# Patient Record
Sex: Male | Born: 1945 | Race: White | Hispanic: No | Marital: Single | Smoking: Former smoker
Health system: Southern US, Community
[De-identification: ages and names within clinical notes are randomized; demographics above are authoritative.]

## PROBLEM LIST (undated history)

## (undated) DIAGNOSIS — E669 Obesity, unspecified: Secondary | ICD-10-CM

## (undated) DIAGNOSIS — I5020 Unspecified systolic (congestive) heart failure: Secondary | ICD-10-CM

## (undated) DIAGNOSIS — I2510 Atherosclerotic heart disease of native coronary artery without angina pectoris: Secondary | ICD-10-CM

## (undated) DIAGNOSIS — I509 Heart failure, unspecified: Secondary | ICD-10-CM

## (undated) DIAGNOSIS — I499 Cardiac arrhythmia, unspecified: Secondary | ICD-10-CM

## (undated) DIAGNOSIS — G250 Essential tremor: Secondary | ICD-10-CM

## (undated) DIAGNOSIS — D759 Disease of blood and blood-forming organs, unspecified: Secondary | ICD-10-CM

## (undated) DIAGNOSIS — E785 Hyperlipidemia, unspecified: Secondary | ICD-10-CM

## (undated) DIAGNOSIS — I219 Acute myocardial infarction, unspecified: Secondary | ICD-10-CM

## (undated) DIAGNOSIS — R251 Tremor, unspecified: Secondary | ICD-10-CM

## (undated) DIAGNOSIS — M25569 Pain in unspecified knee: Secondary | ICD-10-CM

## (undated) DIAGNOSIS — M1990 Unspecified osteoarthritis, unspecified site: Secondary | ICD-10-CM

## (undated) DIAGNOSIS — I10 Essential (primary) hypertension: Secondary | ICD-10-CM

## (undated) HISTORY — DX: Pain in unspecified knee: M25.569

## (undated) HISTORY — PX: EYE SURGERY: SHX253

## (undated) HISTORY — DX: Hyperlipidemia, unspecified: E78.5

## (undated) HISTORY — PX: CARDIAC CATHETERIZATION: SHX172

## (undated) HISTORY — DX: Essential (primary) hypertension: I10

## (undated) HISTORY — PX: HEMORRHOID SURGERY: SHX153

## (undated) HISTORY — PX: BACK SURGERY: SHX140

## (undated) HISTORY — DX: Tremor, unspecified: R25.1

## (undated) HISTORY — DX: Obesity, unspecified: E66.9

## (undated) HISTORY — PX: APPENDECTOMY: SHX54

---
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
## 1998-07-23 ENCOUNTER — Emergency Department (HOSPITAL_COMMUNITY): Admission: EM | Admit: 1998-07-23 | Discharge: 1998-07-23 | Payer: Self-pay | Admitting: *Deleted

## 2003-03-29 ENCOUNTER — Ambulatory Visit (HOSPITAL_COMMUNITY): Admission: RE | Admit: 2003-03-29 | Discharge: 2003-03-29 | Payer: Self-pay | Admitting: Pain Medicine

## 2003-05-18 ENCOUNTER — Encounter: Admission: RE | Admit: 2003-05-18 | Discharge: 2003-06-07 | Payer: Self-pay | Admitting: Neurology

## 2014-12-01 DIAGNOSIS — Z1389 Encounter for screening for other disorder: Secondary | ICD-10-CM | POA: Diagnosis not present

## 2014-12-01 DIAGNOSIS — R21 Rash and other nonspecific skin eruption: Secondary | ICD-10-CM | POA: Diagnosis not present

## 2014-12-01 DIAGNOSIS — I10 Essential (primary) hypertension: Secondary | ICD-10-CM | POA: Diagnosis not present

## 2014-12-01 DIAGNOSIS — Z1211 Encounter for screening for malignant neoplasm of colon: Secondary | ICD-10-CM | POA: Diagnosis not present

## 2014-12-01 DIAGNOSIS — Z0000 Encounter for general adult medical examination without abnormal findings: Secondary | ICD-10-CM | POA: Diagnosis not present

## 2015-01-14 DIAGNOSIS — I10 Essential (primary) hypertension: Secondary | ICD-10-CM | POA: Diagnosis not present

## 2015-03-04 DIAGNOSIS — H579 Unspecified disorder of eye and adnexa: Secondary | ICD-10-CM | POA: Diagnosis not present

## 2015-08-19 DIAGNOSIS — I10 Essential (primary) hypertension: Secondary | ICD-10-CM | POA: Diagnosis not present

## 2015-10-24 ENCOUNTER — Telehealth: Payer: Self-pay | Admitting: Physician Assistant

## 2015-10-24 NOTE — Telephone Encounter (Signed)
Mr. Timothy Munoz called the cardiology after-hour answering service for advice on his current symptom. I am not sure how he got this number. He does not be appears to be an established patient of our clinic. Nor does he have any documented history of cardiac issues. He says he has been having some dizziness when he bends over and then raise his head up. He also has been having some shortness of breath especially noticeable with walking. He denies any chest discomfort.  Given his symptom, I have advised him to keep himself hydrated. His dizziness with change in body positions sounds like orthostatic in nature. As far as his shortness of breath with walking, hydration may help that as well. However if his family physician deemed his symptom as angina equivalent, his family physician will refer the patient formally to cardiology service for assessment. I have advised that the patient to seek medical attention more urgently if started having chest discomfort.  Ramond DialSigned, Miya Luviano PA Pager: [phone]

## 2015-10-25 DIAGNOSIS — R0609 Other forms of dyspnea: Secondary | ICD-10-CM | POA: Diagnosis not present

## 2015-10-31 DIAGNOSIS — E785 Hyperlipidemia, unspecified: Secondary | ICD-10-CM | POA: Insufficient documentation

## 2015-10-31 DIAGNOSIS — I10 Essential (primary) hypertension: Secondary | ICD-10-CM | POA: Insufficient documentation

## 2015-10-31 DIAGNOSIS — E669 Obesity, unspecified: Secondary | ICD-10-CM | POA: Insufficient documentation

## 2015-11-02 ENCOUNTER — Ambulatory Visit (INDEPENDENT_AMBULATORY_CARE_PROVIDER_SITE_OTHER): Payer: Self-pay | Admitting: Cardiology

## 2015-11-02 ENCOUNTER — Encounter: Payer: Self-pay | Admitting: Cardiology

## 2015-11-02 VITALS — BP 156/90 | HR 65 | Ht 74.0 in | Wt 249.0 lb

## 2015-11-02 DIAGNOSIS — I10 Essential (primary) hypertension: Secondary | ICD-10-CM | POA: Diagnosis not present

## 2015-11-02 DIAGNOSIS — R0609 Other forms of dyspnea: Secondary | ICD-10-CM

## 2015-11-02 NOTE — Patient Instructions (Signed)
Your physician recommends that you schedule a follow-up appointment in: to be determined .   Your physician recommends that you continue on your current medications as directed. Please refer to the Current Medication list given to you today.    Your physician has requested that you have an exercise tolerance test. For further information please visit [email or web address]. Please also follow instruction sheet, as given.      Thank you for choosing [facility] !

## 2015-11-02 NOTE — Progress Notes (Signed)
     Clinical Summary Timothy Munoz is a 70 y.o.male seen today as a new patient, he is referred by Dr Lenise ArenaMeyers for the following medical problems.   1. DOE - started about 3 weeks ago. 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 Tigthness in neck. Resolved with rest, but reoccurred with again effort. Since that time has felt ok. Has started walking a few miles daily without symptoms. - had previous mild episodes in the past.  - denies any chest pain. No LE edema. - EKG from pcp NSR, no ischemic changes  CAD risk factors: HTN, mild hyperlipidemia, remote tobacco use    Past Medical History  Diagnosis Date  . Hypertension   . Hyperlipidemia     mild   . Obesity      No Known Allergies   Current Outpatient Prescriptions  Medication Sig Dispense Refill  . clotrimazole (LOTRIMIN) 1 % cream Apply 1 application topically 2 (two) times daily.    Marland Kitchen. losartan (COZAAR) 50 MG tablet Take 50 mg by mouth daily.     No current facility-administered medications for this visit.     No past surgical history on file.   No Known Allergies    No family history on file.   Social History Timothy Munoz reports that he quit smoking about 35 years ago. He does not have any smokeless tobacco history on file. Timothy Munoz reports that he drinks alcohol.   Review of Systems CONSTITUTIONAL: No weight loss, fever, chills, weakness or fatigue.  HEENT: Eyes: No visual loss, blurred vision, double vision or yellow sclerae.No hearing loss, sneezing, congestion, runny nose or sore throat.  SKIN: No rash or itching.  CARDIOVASCULAR: per HPI RESPIRATORY: No shortness of breath, cough or sputum.  GASTROINTESTINAL: No anorexia, nausea, vomiting or diarrhea. No abdominal pain or blood.  GENITOURINARY: No burning on urination, no polyuria NEUROLOGICAL: No headache, dizziness, syncope, paralysis, ataxia, numbness or tingling in the extremities. No change in bowel or bladder control.    MUSCULOSKELETAL: No muscle, back pain, joint pain or stiffness.  LYMPHATICS: No enlarged nodes. No history of splenectomy.  PSYCHIATRIC: No history of depression or anxiety.  ENDOCRINOLOGIC: No reports of sweating, cold or heat intolerance. No polyuria or polydipsia.  Marland Kitchen.   Physical Examination Filed Vitals:   11/02/15 0806  BP: 156/90  Pulse: 65   Filed Vitals:   11/02/15 0806  Height: 6\' 2"  (1.88 m)  Weight: 249 lb (112.946 kg)    Gen: resting comfortably, no acute distress HEENT: no scleral icterus, pupils equal round and reactive, no palptable cervical adenopathy,  CV: RRR, no m/r/g, no jvd Resp: Clear to auscultation bilaterally GI: abdomen is soft, non-tender, non-distended, normal bowel sounds, no hepatosplenomegaly MSK: extremities are warm, no edema.  Skin: warm, no rash Neuro:  no focal deficits Psych: appropriate affect   Assessment and Plan  1. DOE - episode of SOB and neck pain, concern for possible anginal equivalent though symptoms are atypical. He does have CAD risk factors - plan for GXT to further evaluate  2. HTN - elevated in clinic, he reports good control at previous MD visits. Continue to monitor at this time    F/u pending stress results.       Antoine PocheJonathan F. Branch, M.D.

## 2015-11-08 ENCOUNTER — Encounter (HOSPITAL_COMMUNITY): Payer: Commercial Managed Care - HMO

## 2015-12-05 DIAGNOSIS — B351 Tinea unguium: Secondary | ICD-10-CM | POA: Diagnosis not present

## 2016-03-01 DIAGNOSIS — Z1211 Encounter for screening for malignant neoplasm of colon: Secondary | ICD-10-CM | POA: Diagnosis not present

## 2016-03-01 DIAGNOSIS — E669 Obesity, unspecified: Secondary | ICD-10-CM | POA: Diagnosis not present

## 2016-03-01 DIAGNOSIS — I10 Essential (primary) hypertension: Secondary | ICD-10-CM | POA: Diagnosis not present

## 2016-05-21 DIAGNOSIS — H5200 Hypermetropia, unspecified eye: Secondary | ICD-10-CM | POA: Diagnosis not present

## 2016-05-21 DIAGNOSIS — H2513 Age-related nuclear cataract, bilateral: Secondary | ICD-10-CM | POA: Diagnosis not present

## 2016-05-21 DIAGNOSIS — Z0100 Encounter for examination of eyes and vision without abnormal findings: Secondary | ICD-10-CM | POA: Diagnosis not present

## 2016-05-21 DIAGNOSIS — I10 Essential (primary) hypertension: Secondary | ICD-10-CM | POA: Diagnosis not present

## 2016-09-10 DIAGNOSIS — Z0000 Encounter for general adult medical examination without abnormal findings: Secondary | ICD-10-CM | POA: Diagnosis not present

## 2016-09-10 DIAGNOSIS — I10 Essential (primary) hypertension: Secondary | ICD-10-CM | POA: Diagnosis not present

## 2016-10-16 DIAGNOSIS — G8929 Other chronic pain: Secondary | ICD-10-CM | POA: Diagnosis not present

## 2016-10-16 DIAGNOSIS — M25561 Pain in right knee: Secondary | ICD-10-CM | POA: Diagnosis not present

## 2016-11-19 DIAGNOSIS — R251 Tremor, unspecified: Secondary | ICD-10-CM | POA: Diagnosis not present

## 2016-11-19 DIAGNOSIS — M25561 Pain in right knee: Secondary | ICD-10-CM | POA: Diagnosis not present

## 2016-11-19 DIAGNOSIS — G8929 Other chronic pain: Secondary | ICD-10-CM | POA: Diagnosis not present

## 2016-11-22 ENCOUNTER — Ambulatory Visit: Payer: Commercial Managed Care - HMO | Admitting: Neurology

## 2016-12-24 ENCOUNTER — Encounter: Payer: Self-pay | Admitting: Neurology

## 2016-12-24 ENCOUNTER — Ambulatory Visit (INDEPENDENT_AMBULATORY_CARE_PROVIDER_SITE_OTHER): Payer: Medicare HMO | Admitting: Neurology

## 2016-12-24 ENCOUNTER — Other Ambulatory Visit: Payer: Medicare HMO

## 2016-12-24 VITALS — BP 132/72 | HR 72 | Ht 75.0 in | Wt 252.0 lb

## 2016-12-24 DIAGNOSIS — R251 Tremor, unspecified: Secondary | ICD-10-CM | POA: Diagnosis not present

## 2016-12-24 DIAGNOSIS — G250 Essential tremor: Secondary | ICD-10-CM | POA: Diagnosis not present

## 2016-12-24 LAB — TSH: TSH: 2.45 mIU/L (ref 0.40–4.50)

## 2016-12-24 LAB — COMPREHENSIVE METABOLIC PANEL
ALT: 14 U/L (ref 9–46)
AST: 16 U/L (ref 10–35)
Albumin: 4.7 g/dL (ref 3.6–5.1)
Alkaline Phosphatase: 170 U/L — ABNORMAL HIGH (ref 40–115)
BUN: 19 mg/dL (ref 7–25)
CO2: 25 mmol/L (ref 20–32)
Calcium: 9.8 mg/dL (ref 8.6–10.3)
Chloride: 101 mmol/L (ref 98–110)
Creat: 1.17 mg/dL (ref 0.70–1.18)
Glucose, Bld: 103 mg/dL — ABNORMAL HIGH (ref 65–99)
Potassium: 4.7 mmol/L (ref 3.5–5.3)
Sodium: 139 mmol/L (ref 135–146)
Total Bilirubin: 0.5 mg/dL (ref 0.2–1.2)
Total Protein: 7.2 g/dL (ref 6.1–8.1)

## 2016-12-24 NOTE — Patient Instructions (Signed)
1. Your provider has requested that you have labwork completed today. Please go to [facility] ([id number]) on the second floor of this building before leaving the office today. You do not need to check in. If you are not called within 15 minutes please check with the front desk.   

## 2016-12-24 NOTE — Progress Notes (Signed)
Timothy Munoz was seen today in the movement disorders clinic for neurologic consultation at the request of Le, Thao P, DO.  The consultation is for the evaluation of tremor.  Tremor: Yes.     How long has it been going on? 15+ years  At rest or with activation?  With activation; with hand on steering wheel  Fam hx of tremor?  Yes.  , mom had PD and AD  Located where?  R hand  Affected by caffeine:  unknown (2 cups coffee, 1 diet soda/tea per day)  Affected by alcohol:  unknown (1 beer every 2 months)  Affected by stress:  Yes.    Affected by fatigue:  No.  Spills soup if on spoon:  Yes.    (he is R hand dominant)  Peas will fall off: yes  Spills glass of liquid if full:  Yes.    Affects ADL's (tying shoes, brushing teeth, etc):  No.   Specific Symptoms:  Voice: no change Sleep: sleeps well except nocturia  Vivid Dreams:  No.  Acting out dreams:  No. Wet Pillows: Yes.   Postural symptoms:  No.  Falls?  Yes.  , but last time was 4 years ago and was on top of load of hay on top of a wagon and fell and broke wrist Bradykinesia symptoms: difficulty getting out of a chair Loss of smell:  Yes.   Loss of taste:  No. Urinary Incontinence:  No. Difficulty Swallowing:  No. Handwriting, micrographia: No. Trouble with ADL's:  No.  Trouble buttoning clothing: No. Depression:  No. Memory changes:  No. Hallucinations:  No.  visual distortions: No. N/V:  No. Lightheaded:  No.  Syncope: No. Diplopia:  No. Dyskinesia:  No.  Neuroimaging of the brain has not previously been performed.   PREVIOUS MEDICATIONS: none to date  ALLERGIES:  No Known Allergies  CURRENT MEDICATIONS:  Outpatient Encounter Prescriptions as of 12/24/2016  Medication Sig  . clotrimazole (LOTRIMIN) 1 % cream Apply 1 application topically 2 (two) times daily.  Marland Kitchen losartan (COZAAR) 50 MG tablet Take 50 mg by mouth daily.  . meloxicam (MOBIC) 15 MG tablet Take 15 mg by mouth as needed for pain.  . Omega-3  Fatty Acids (FISH OIL) 1000 MG CAPS Take by mouth.   No facility-administered encounter medications on file as of 12/24/2016.     PAST MEDICAL HISTORY:   Past Medical History:  Diagnosis Date  . Hyperlipidemia    mild   . Hypertension   . Knee pain   . Obesity     PAST SURGICAL HISTORY:   Past Surgical History:  Procedure Laterality Date  . APPENDECTOMY    . BACK SURGERY    . HEMORRHOID SURGERY      SOCIAL HISTORY:   Social History   Social History  . Marital status: Single    Spouse name: N/A  . Number of children: N/A  . Years of education: N/A   Occupational History  . retired     Pharmacist, hospital   Social History Main Topics  . Smoking status: Former Smoker    Quit date: 06/01/1980  . Smokeless tobacco: Current User    Types: Chew  . Alcohol use Yes     Comment: one beer every two months  . Drug use: No  . Sexual activity: Not on file   Other Topics Concern  . Not on file   Social History Narrative  . No narrative on file  FAMILY HISTORY:   Family Status  Relation Status  . Mother Deceased  . Father Deceased  . Sister Alive  . Child Alive    ROS:  A complete 10 system review of systems was obtained and was unremarkable apart from what is mentioned above.  PHYSICAL EXAMINATION:    VITALS:   Vitals:   12/24/16 0857  BP: 132/72  Pulse: 72  SpO2: 98%  Weight: 252 lb (114.3 kg)  Height: 6\' 3"  (1.905 m)    GEN:  The patient appears stated age and is in NAD. HEENT:  Normocephalic, atraumatic.  The mucous membranes are moist. The superficial temporal arteries are without ropiness or tenderness. CV:  RRR Lungs:  CTAB Neck/HEME:  There are no carotid bruits bilaterally.  Neurological examination:  Orientation: The patient is alert and oriented x3. Fund of knowledge is appropriate.  Recent and remote memory are intact.  Attention and concentration are normal.    Able to name objects and repeat phrases. Cranial nerves: There is good facial  symmetry. Pupils are  reactive to light bilaterally.  Pupil on the right is slightly asymmetric.  Fundoscopic exam reveals clear margins bilaterally. Extraocular muscles are intact with the exception of mild upgaze paresis. The visual fields are full to confrontational testing. The speech is fluent and clear. Soft palate rises symmetrically and there is no tongue deviation. Hearing is intact to conversational tone. Sensation: Sensation is intact to light and pinprick throughout (facial, trunk, extremities). Vibration is intact at the right big toe and both ankles. There is no extinction with double simultaneous stimulation. There is no sensory dermatomal level identified. Motor: Strength is 5/5 in the bilateral upper and lower extremities.   Shoulder shrug is equal and symmetric.  There is no pronator drift. Deep tendon reflexes: Deep tendon reflexes are 2/4 at the bilateral biceps, triceps, brachioradialis, patella and achilles. Plantar responses are downgoing bilaterally.  Movement examination: Tone: There is normal tone in the bilateral upper extremities.  The tone in the lower extremities is normal. (he does have some trouble relaxing) Abnormal movements: There is no resting tremor.  he has some difficulty with archimedes spirals on the right but does fairly well on the L.  he has mild difficulty when asked to pour a full glass of water from one glass to another but spills it minimally. Coordination:  There is no decremation with RAM's, with alternating supination and pronation of the forearm, hand opening and closing, heel taps and toe taps.  Minimal trouble with finger taps on the right. Gait and Station: The patient has no difficulty arising out of a deep-seated chair without the use of the hands. The patient's stride length is normal.  Walks well tandem and with heel/toe.  Stands in [place] position.    ASSESSMENT/PLAN:  1.   Essential Tremor.  -His is asymmetric but that can occur in about 30%  of cases.  He does have a longstanding hx of tremor.  We discussed nature and pathophysiology.  We discussed that this can continue to gradually get worse with time.  We discussed that some medications can worsen this, as can caffeine use.  We discussed medication therapy as well as surgical therapy.  Ultimately, the patient decided to hold on medication and take a wait and see approach.  I would like to check his TSH and chem since he states that it has been greater than 5 years since he has had labs.  We will call him with results.  I  would also like to see him in a year.  This is because of inability to relax for testing and just slight difficulty in doing finger taps on the right.  At this point in time, he does not meet criteria for PD and I stressed that to him.  If he has new sx's develop before a year, he will call me.  Much greater than 50% of this visit was spent in counseling and coordinating care.  Total face to face time:  45 min      Cc:  [name], MD

## 2016-12-25 ENCOUNTER — Telehealth: Payer: Self-pay | Admitting: Neurology

## 2016-12-25 NOTE — Telephone Encounter (Signed)
Patient made aware of results and instructions. Labs sent to PCP.

## 2016-12-25 NOTE — Telephone Encounter (Signed)
-----   Message from Octaviano Battyebecca S Tat, DO sent at 12/25/2016  7:32 AM EDT ----- Lesly RubensteinJade, Let pt know that labs okay except ALP and that was up a little.  That is nonspecific and can even relate to bone health.  Have him f/u with PCP about that and make sure that PCP has copy of labs.

## 2016-12-31 ENCOUNTER — Ambulatory Visit: Payer: Commercial Managed Care - HMO | Admitting: Neurology

## 2017-03-25 DIAGNOSIS — I10 Essential (primary) hypertension: Secondary | ICD-10-CM | POA: Diagnosis not present

## 2017-03-25 DIAGNOSIS — M546 Pain in thoracic spine: Secondary | ICD-10-CM | POA: Diagnosis not present

## 2017-09-23 DIAGNOSIS — R69 Illness, unspecified: Secondary | ICD-10-CM | POA: Diagnosis not present

## 2017-12-12 DIAGNOSIS — Z6833 Body mass index (BMI) 33.0-33.9, adult: Secondary | ICD-10-CM | POA: Diagnosis not present

## 2017-12-12 DIAGNOSIS — Z1211 Encounter for screening for malignant neoplasm of colon: Secondary | ICD-10-CM | POA: Diagnosis not present

## 2017-12-12 DIAGNOSIS — E669 Obesity, unspecified: Secondary | ICD-10-CM | POA: Diagnosis not present

## 2017-12-12 DIAGNOSIS — I10 Essential (primary) hypertension: Secondary | ICD-10-CM | POA: Diagnosis not present

## 2017-12-23 NOTE — Progress Notes (Deleted)
Timothy Munoz was seen today in the movement disorders clinic for follow up for asymmetric essential tremor.  It has been a year since he has been seen.    PREVIOUS MEDICATIONS: none to date  ALLERGIES:  No Known Allergies  CURRENT MEDICATIONS:  Outpatient Encounter Medications as of 12/24/2017  Medication Sig  . clotrimazole (LOTRIMIN) 1 % cream Apply 1 application topically 2 (two) times daily.  Marland Kitchen. losartan (COZAAR) 50 MG tablet Take 50 mg by mouth daily.  . meloxicam (MOBIC) 15 MG tablet Take 15 mg by mouth as needed for pain.  . Omega-3 Fatty Acids (FISH OIL) 1000 MG CAPS Take by mouth.   No facility-administered encounter medications on file as of 12/24/2017.     PAST MEDICAL HISTORY:   Past Medical History:  Diagnosis Date  . Hyperlipidemia    mild   . Hypertension   . Knee pain   . Obesity     PAST SURGICAL HISTORY:   Past Surgical History:  Procedure Laterality Date  . APPENDECTOMY    . BACK SURGERY    . HEMORRHOID SURGERY      SOCIAL HISTORY:   Social History   Socioeconomic History  . Marital status: Single    Spouse name: Not on file  . Number of children: Not on file  . Years of education: Not on file  . Highest education level: Not on file  Occupational History  . Occupation: retired    Comment: Pharmacist, [employer]  Social Needs  . Financial resource strain: Not on file  . Food insecurity:    Worry: Not on file    Inability: Not on file  . Transportation needs:    Medical: Not on file    Non-medical: Not on file  Tobacco Use  . Smoking status: Former Smoker    Last attempt to quit: 06/01/1980    Years since quitting: 37.5  . Smokeless tobacco: Current User    Types: Chew  Substance and Sexual Activity  . Alcohol use: Yes    Comment: one beer every two months  . Drug use: No  . Sexual activity: Not on file  Lifestyle  . Physical activity:    Days per week: Not on file    Minutes per session: Not on file  . Stress: Not on file    Relationships  . Social connections:    Talks on phone: Not on file    Gets together: Not on file    Attends religious service: Not on file    Active member of club or organization: Not on file    Attends meetings of clubs or organizations: Not on file    Relationship status: Not on file  . Intimate partner violence:    Fear of current or ex partner: Not on file    Emotionally abused: Not on file    Physically abused: Not on file    Forced sexual activity: Not on file  Other Topics Concern  . Not on file  Social History Narrative  . Not on file    FAMILY HISTORY:   Family Status  Relation Name Status  . Mother  Deceased  . Father  Deceased  . Sister  Alive  . Child x2 Alive    ROS:  A complete 10 system review of systems was obtained and was unremarkable apart from what is mentioned above.  PHYSICAL EXAMINATION:    VITALS:   There were no vitals filed for this visit.  GEN:  The patient appears stated age and is in NAD. HEENT:  Normocephalic, atraumatic.  The mucous membranes are moist. The superficial temporal arteries are without ropiness or tenderness. CV:  RRR Lungs:  CTAB Neck/HEME:  There are no carotid bruits bilaterally.  Neurological examination:  Orientation: The patient is alert and oriented x3. Fund of knowledge is appropriate.  Recent and remote memory are intact.  Attention and concentration are normal.    Able to name objects and repeat phrases. Cranial nerves: There is good facial symmetry. Pupils are  reactive to light bilaterally.  Pupil on the right is slightly asymmetric.  Fundoscopic exam reveals clear margins bilaterally. Extraocular muscles are intact with the exception of mild upgaze paresis. The visual fields are full to confrontational testing. The speech is fluent and clear. Soft palate rises symmetrically and there is no tongue deviation. Hearing is intact to conversational tone. Sensation: Sensation is intact to light and pinprick throughout  (facial, trunk, extremities). Vibration is intact at the right big toe and both ankles. There is no extinction with double simultaneous stimulation. There is no sensory dermatomal level identified. Motor: Strength is 5/5 in the bilateral upper and lower extremities.   Shoulder shrug is equal and symmetric.  There is no pronator drift. Deep tendon reflexes: Deep tendon reflexes are 2/4 at the bilateral biceps, triceps, brachioradialis, patella and achilles. Plantar responses are downgoing bilaterally.  Movement examination: Tone: There is normal tone in the bilateral upper extremities.  The tone in the lower extremities is normal. (he does have some trouble relaxing) Abnormal movements: There is no resting tremor.  he has some difficulty with archimedes spirals on the right but does fairly well on the L.  he has mild difficulty when asked to pour a full glass of water from one glass to another but spills it minimally. Coordination:  There is no decremation with RAM's, with alternating supination and pronation of the forearm, hand opening and closing, heel taps and toe taps.  Minimal trouble with finger taps on the right. Gait and Station: The patient has no difficulty arising out of a deep-seated chair without the use of the hands. The patient's stride length is normal.  Walks well tandem and with heel/toe.  Stands in [place] position.    Labs: Lab Results  Component Value Date   TSH 2.45 12/24/2016     Chemistry      Component Value Date/Time   NA 139 12/24/2016 1015   K 4.7 12/24/2016 1015   CL 101 12/24/2016 1015   CO2 25 12/24/2016 1015   BUN 19 12/24/2016 1015   CREATININE 1.17 12/24/2016 1015      Component Value Date/Time   CALCIUM 9.8 12/24/2016 1015   ALKPHOS 170 (H) 12/24/2016 1015   AST 16 12/24/2016 1015   ALT 14 12/24/2016 1015   BILITOT 0.5 12/24/2016 1015       ASSESSMENT/PLAN:  1.   Essential Tremor.  -His is asymmetric but that can occur in about 30% of cases.   He does have a longstanding hx of tremor.  We discussed nature and pathophysiology.  We discussed that this can continue to gradually get worse with time.  We discussed that some medications can worsen this, as can caffeine use.  We discussed medication therapy as well as surgical therapy.  Ultimately, the patient decided to hold on medication and take a wait and see approach.  I would like to check his TSH and chem since he states that it  has been greater than 5 years since he has had labs.  We will call him with results.  I would also like to see him in a year.  This is because of inability to relax for testing and just slight difficulty in doing finger taps on the right.  At this point in time, he does not meet criteria for PD and I stressed that to him.  If he has new sx's develop before a year, he will call me.  Much greater than 50% of this visit was spent in counseling and coordinating care.  Total face to face time:  45 min      Cc:  Joycelyn RuaMeyers, Stephen, MD

## 2017-12-24 ENCOUNTER — Ambulatory Visit: Payer: Medicare HMO | Admitting: Neurology

## 2019-01-22 DIAGNOSIS — B356 Tinea cruris: Secondary | ICD-10-CM | POA: Diagnosis not present

## 2019-01-22 DIAGNOSIS — I10 Essential (primary) hypertension: Secondary | ICD-10-CM | POA: Diagnosis not present

## 2019-01-22 DIAGNOSIS — G250 Essential tremor: Secondary | ICD-10-CM | POA: Diagnosis not present

## 2019-03-04 ENCOUNTER — Ambulatory Visit (INDEPENDENT_AMBULATORY_CARE_PROVIDER_SITE_OTHER): Payer: Medicare HMO | Admitting: Neurology

## 2019-03-04 ENCOUNTER — Other Ambulatory Visit: Payer: Self-pay

## 2019-03-04 ENCOUNTER — Encounter: Payer: Self-pay | Admitting: Neurology

## 2019-03-04 DIAGNOSIS — G250 Essential tremor: Secondary | ICD-10-CM

## 2019-03-04 HISTORY — DX: Essential tremor: G25.0

## 2019-03-04 MED ORDER — ALPRAZOLAM 0.5 MG PO TABS: 0.5000 mg | ORAL_TABLET | Freq: Three times a day (TID) | ORAL | 2 refills | Status: DC | PRN

## 2019-03-04 NOTE — Progress Notes (Signed)
Reason for visit: Tremor  Referring physician: Dr. Para March Timothy Munoz is a 73 y.o. male  History of present illness:  Timothy Munoz is a 73 year old right-handed white male with a history of a tremor affecting both upper extremities for greater than 17 years.  The patient claims that his mother had Alzheimer's disease but she also had tremor but was told she had Parkinson's disease.  He knows of no other family members who have had tremor, he has 1 sister.  The patient indicates that over time, the tremor has gradually worsened.  He is having increasing problems with handwriting and with feeding himself, he likes to do a lot of fishing but has trouble tying the knots on the fishing line.  The patient does not have any tremor when resting the arms.  He reports no tremors associated with the head or neck or with the voice.  He reports some mild gait instability problems but no problems with falling.  He does have urinary frequency but no incontinence.  He reports no significant numbness or weakness of the extremities.  He comes to this office for an evaluation.  Past Medical History:  Diagnosis Date  . Hyperlipidemia    mild   . Hypertension   . Knee pain   . Obesity   . Tremor     Past Surgical History:  Procedure Laterality Date  . APPENDECTOMY    . BACK SURGERY    . HEMORRHOID SURGERY      Family History  Problem Relation Age of Onset  . Hypertension Mother   . Parkinson's disease Mother   . Alzheimer's disease Mother   . Hypertension Father   . Suicidality Father     Social history:  reports that he quit smoking about 38 years ago. His smokeless tobacco use includes chew. He reports current alcohol use. He reports that he does not use drugs.  Medications:  Prior to Admission medications   Medication Sig Start Date End Date Taking? Authorizing Provider  losartan (COZAAR) 50 MG tablet Take 50 mg by mouth daily.   Yes [provider]  Omega-3 Fatty Acids  (FISH OIL) 1000 MG CAPS Take by mouth.   Yes [provider]     No Known Allergies  ROS:  Out of a complete 14 system review of symptoms, the patient complains only of the following symptoms, and all other reviewed systems are negative.  Tremor  Blood pressure (!) 143/88, pulse 76, temperature 99.2 F (37.3 C), temperature source Temporal, height 6\' 3"  (1.905 m), weight 257 lb (116.6 kg).  Physical Exam  General: The patient is alert and cooperative at the time of the examination.  Eyes: Pupils are equal, round, and reactive to light. Discs are flat bilaterally.  Neck: The neck is supple, no carotid bruits are noted.  Respiratory: The respiratory examination is clear.  Cardiovascular: The cardiovascular examination reveals a regular rate and rhythm, no obvious murmurs or rubs are noted.  Skin: Extremities are without significant edema.  Neurologic Exam  Mental status: The patient is alert and oriented x 3 at the time of the examination. The patient has apparent normal recent and remote memory, with an apparently normal attention span and concentration ability.  Cranial nerves: Facial symmetry is present. There is good sensation of the face to pinprick and soft touch bilaterally. The strength of the facial muscles and the muscles to head turning and shoulder shrug are normal bilaterally. Speech is well enunciated, no  aphasia or dysarthria is noted. Extraocular movements are full. Visual fields are full. The tongue is midline, and the patient has symmetric elevation of the soft palate. No obvious hearing deficits are noted.  Motor: The motor testing reveals 5 over 5 strength of all 4 extremities. Good symmetric motor tone is noted throughout.  Sensory: Sensory testing is intact to pinprick, soft touch, vibration sensation, and position sense on the upper extremities.  With the lower extremities there is a stocking pattern pinprick sensory deficit across the ankles  bilaterally with decreased vibration sensation in both feet, position sensation is normal in both feet.  No evidence of extinction is noted.  Coordination: Cerebellar testing reveals good finger-nose-finger and heel-to-shin bilaterally.  The patient does have an intention tremor with finger-nose-finger bilaterally, when trying to draw a spiral there is tremor translated into the handwriting.  Gait and station: Gait is normal. Tandem gait is normal. Romberg is negative. No drift is seen.  Reflexes: Deep tendon reflexes are symmetric, but are depressed in the lower extremities bilaterally. Toes are downgoing bilaterally.   Assessment/Plan:  1.  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  The patient does not wish to go on a daily medication, but he may want to use something intermittently.  I will send in a prescription for alprazolam taking 0.5 mg up to 3 times a day if needed.  On the days he does not need medication at all, he will take nothing.  The patient will follow-up here in 6 months, he will call for any dose adjustments.  Marlan Palau MD 03/04/2019 11:23 AM  [facility] [address] [place], [place] [address]  Phone [phone] Fax [phone]

## 2019-03-04 NOTE — Patient Instructions (Signed)
May take alprazolam 0.5 mg if needed for the tremor.

## 2019-03-05 DIAGNOSIS — H5203 Hypermetropia, bilateral: Secondary | ICD-10-CM | POA: Diagnosis not present

## 2019-03-05 DIAGNOSIS — H524 Presbyopia: Secondary | ICD-10-CM | POA: Diagnosis not present

## 2019-03-05 DIAGNOSIS — H52223 Regular astigmatism, bilateral: Secondary | ICD-10-CM | POA: Diagnosis not present

## 2019-06-22 ENCOUNTER — Other Ambulatory Visit: Payer: Self-pay | Admitting: Neurology

## 2019-09-02 ENCOUNTER — Ambulatory Visit: Payer: Medicare HMO | Admitting: Neurology

## 2019-12-15 DIAGNOSIS — M25551 Pain in right hip: Secondary | ICD-10-CM | POA: Diagnosis not present

## 2019-12-15 DIAGNOSIS — Z1211 Encounter for screening for malignant neoplasm of colon: Secondary | ICD-10-CM | POA: Diagnosis not present

## 2019-12-21 DIAGNOSIS — M533 Sacrococcygeal disorders, not elsewhere classified: Secondary | ICD-10-CM | POA: Diagnosis not present

## 2020-01-20 ENCOUNTER — Telehealth: Payer: Self-pay | Admitting: Neurology

## 2020-01-20 MED ORDER — ALPRAZOLAM 0.5 MG PO TABS: 0.5000 mg | ORAL_TABLET | Freq: Three times a day (TID) | ORAL | 0 refills | Status: DC | PRN

## 2020-01-20 NOTE — Telephone Encounter (Signed)
Called patient and advised he needs FU; he canceled FU in April. We scheduled for soonest with NP, and I advised we will refill Xanax until he comes in for FU. Patient verbalized understanding, appreciation. unableto complete drug check on [place] [facility] registry.  [employer], spoke with Vanice Sarah who checked [place] drug registry. He got Xanax Rx on Oct 2020, Dec 2020, Feb 2021 for #30 tabs each time. Refill request sent to work in [place].

## 2020-01-20 NOTE — Telephone Encounter (Signed)
Meds ordered this encounter  Medications  . ALPRAZolam (XANAX) 0.5 MG tablet    Sig: Take 1 tablet (0.5 mg total) by mouth 3 (three) times daily as needed (tremor).    Dispense:  30 tablet    Refill:  0   Suanne Marker, MD 01/20/2020, 9:51 AM Certified in Neurology, Neurophysiology and Neuroimaging  [facility] [address], [address] [place], [place] [address] [phone]

## 2020-01-20 NOTE — Telephone Encounter (Signed)
Pt request refill ALPRAZolam (XANAX) 0.5 MG tablet at [facility]

## 2020-01-25 DIAGNOSIS — H52223 Regular astigmatism, bilateral: Secondary | ICD-10-CM | POA: Diagnosis not present

## 2020-01-25 DIAGNOSIS — H5203 Hypermetropia, bilateral: Secondary | ICD-10-CM | POA: Diagnosis not present

## 2020-01-25 DIAGNOSIS — H524 Presbyopia: Secondary | ICD-10-CM | POA: Diagnosis not present

## 2020-02-25 ENCOUNTER — Telehealth: Payer: Self-pay

## 2020-02-25 NOTE — Telephone Encounter (Signed)
Attempted to call pt, LVM  

## 2020-02-29 ENCOUNTER — Ambulatory Visit: Payer: Medicare HMO | Admitting: Neurology

## 2020-02-29 ENCOUNTER — Encounter: Payer: Self-pay | Admitting: Neurology

## 2020-02-29 VITALS — BP 179/100 | HR 68 | Ht 75.0 in | Wt 265.6 lb

## 2020-02-29 DIAGNOSIS — G250 Essential tremor: Secondary | ICD-10-CM

## 2020-02-29 MED ORDER — ALPRAZOLAM 0.5 MG PO TABS: 0.5000 mg | ORAL_TABLET | Freq: Three times a day (TID) | ORAL | 0 refills | Status: DC | PRN

## 2020-02-29 NOTE — Progress Notes (Signed)
I have read the note, and I agree with the clinical assessment and plan.  Timothy Munoz   

## 2020-02-29 NOTE — Progress Notes (Signed)
PATIENT: Timothy Munoz DOB: 05-05-1946  REASON FOR VISIT: follow up HISTORY FROM: patient  HISTORY OF PRESENT ILLNESS: Today 02/29/20 Timothy Munoz is a 74 year old male with history of tremor affecting both upper extremities.  Reportedly his mother had Alzheimer's disease, and Parkinson's disease with tremor.  He takes Xanax as needed for tremor, never more than once a day, but he thinks he helps when he takes it.  The tremor is gradually progressing over time.  There are times he has significant difficulty with eating, stopped for spaghetti recently, had terrible time.  Sometimes holding the steering well with his right hand, will notice tremoring, or holding something with a tight grip, affects handwriting.  Tremor more in the right hand than left.  No falls, balance is okay.  He is retired, but remains active.  His overall health is good.  Presents today for evaluation unaccompanied.  HISTORY 03/04/2019 Dr. Anne Hahn: Timothy Munoz is a 74 year old right-handed white male with a history of a tremor affecting both upper extremities for greater than 17 years.  The patient claims that his mother had Alzheimer's disease but she also had tremor but was told she had Parkinson's disease.  He knows of no other family members who have had tremor, he has 1 sister.  The patient indicates that over time, the tremor has gradually worsened.  He is having increasing problems with handwriting and with feeding himself, he likes to do a lot of fishing but has trouble tying the knots on the fishing line.  The patient does not have any tremor when resting the arms.  He reports no tremors associated with the head or neck or with the voice.  He reports some mild gait instability problems but no problems with falling.  He does have urinary frequency but no incontinence.  He reports no significant numbness or weakness of the extremities.  He comes to this office for an evaluation.   REVIEW OF SYSTEMS: Out of a complete 14  system review of symptoms, the patient complains only of the following symptoms, and all other reviewed systems are negative.  Tremor  ALLERGIES: No Known Allergies  HOME MEDICATIONS: Outpatient Medications Prior to Visit  Medication Sig Dispense Refill  . losartan (COZAAR) 50 MG tablet Take 50 mg by mouth daily.    . Omega-3 Fatty Acids (FISH OIL) 1000 MG CAPS Take by mouth.    . ALPRAZolam (XANAX) 0.5 MG tablet Take 1 tablet (0.5 mg total) by mouth 3 (three) times daily as needed (tremor). 30 tablet 0   No facility-administered medications prior to visit.    PAST MEDICAL HISTORY: Past Medical History:  Diagnosis Date  . Hyperlipidemia    mild   . Hypertension   . Knee pain   . Obesity   . Tremor   . Tremor, essential 03/04/2019    PAST SURGICAL HISTORY: Past Surgical History:  Procedure Laterality Date  . APPENDECTOMY    . BACK SURGERY    . HEMORRHOID SURGERY      FAMILY HISTORY: Family History  Problem Relation Age of Onset  . Hypertension Mother   . Parkinson's disease Mother   . Alzheimer's disease Mother   . Hypertension Father   . Suicidality Father     SOCIAL HISTORY: Social History   Socioeconomic History  . Marital status: Single    Spouse name: Not on file  . Number of children: Not on file  . Years of education: Not on file  . Highest education  level: Not on file  Occupational History  . Occupation: retired    Comment: Pharmacist, hospital  Tobacco Use  . Smoking status: Former Smoker    Quit date: 06/01/1980    Years since quitting: 39.7  . Smokeless tobacco: Current User    Types: Chew  Substance and Sexual Activity  . Alcohol use: Yes    Comment: one beer every two months  . Drug use: No  . Sexual activity: Not on file  Other Topics Concern  . Not on file  Social History Narrative   Right handed    Social Determinants of Health   Financial Resource Strain:   . Difficulty of Paying Living Expenses: Not on file  Food Insecurity:    . Worried About Programme researcher, broadcasting/film/video in the Last Year: Not on file  . Ran Out of Food in the Last Year: Not on file  Transportation Needs:   . Lack of Transportation (Medical): Not on file  . Lack of Transportation (Non-Medical): Not on file  Physical Activity:   . Days of Exercise per Week: Not on file  . Minutes of Exercise per Session: Not on file  Stress:   . Feeling of Stress : Not on file  Social Connections:   . Frequency of Communication with Friends and Family: Not on file  . Frequency of Social Gatherings with Friends and Family: Not on file  . Attends Religious Services: Not on file  . Active Member of Clubs or Organizations: Not on file  . Attends Banker Meetings: Not on file  . Marital Status: Not on file  Intimate Partner Violence:   . Fear of Current or Ex-Partner: Not on file  . Emotionally Abused: Not on file  . Physically Abused: Not on file  . Sexually Abused: Not on file   PHYSICAL EXAM  Vitals:   02/29/20 0850  BP: (!) 179/100  Pulse: 68  Weight: 265 lb 9.6 oz (120.5 kg)  Height: 6\' 3"  (1.905 m)   Body mass index is 33.2 kg/m. Generalized: Well developed, in no acute distress  Neurological examination  Mentation: Alert oriented to time, place, history taking. Follows all commands speech and language fluent Cranial nerve II-XII: Pupils were equal round reactive to light. Extraocular movements were full, visual field were full on confrontational test. Facial sensation and strength were normal.  Head turning and shoulder shrug  were normal and symmetric. Motor: The motor testing reveals 5 over 5 strength of all 4 extremities. Good symmetric motor tone is noted throughout.  Sensory: Sensory testing is intact to soft touch on all 4 extremities. No evidence of extinction is noted.  Coordination: Cerebellar testing reveals good finger-nose-finger and heel-to-shin bilaterally.  Mild to moderate intention tremor with finger-nose-finger  bilaterally. Gait and station: Gait is normal.  DIAGNOSTIC DATA (LABS, IMAGING, TESTING) - I reviewed patient records, labs, notes, testing and imaging myself where available.  No results found for: WBC, HGB, HCT, MCV, PLT    Component Value Date/Time   NA 139 12/24/2016 1015   K 4.7 12/24/2016 1015   CL 101 12/24/2016 1015   CO2 25 12/24/2016 1015   GLUCOSE 103 (H) 12/24/2016 1015   BUN 19 12/24/2016 1015   CREATININE 1.17 12/24/2016 1015   CALCIUM 9.8 12/24/2016 1015   PROT 7.2 12/24/2016 1015   ALBUMIN 4.7 12/24/2016 1015   AST 16 12/24/2016 1015   ALT 14 12/24/2016 1015   ALKPHOS 170 (H) 12/24/2016 1015   BILITOT 0.5  12/24/2016 1015   No results found for: CHOL, HDL, LDLCALC, LDLDIRECT, TRIG, CHOLHDL No results found for: MVHQ4O No results found for: VITAMINB12 Lab Results  Component Value Date   TSH 2.45 12/24/2016   ASSESSMENT AND PLAN 74 y.o. year old male  has a past medical history of Hyperlipidemia, Hypertension, Knee pain, Obesity, Tremor, and Tremor, essential (03/04/2019). here with:  1.  Essential tremor -Bilateral upper extremities, R > L, progressing over time -Taking Xanax 0.5 mg daily, seems to help, will try taking twice daily to see if benefit (checked drug registry, no recent fills on file) -If no help, will consider propanolol starting 20 mg BID , Xanax as needed  -Repeat BP 160/80, needs to schedule annual physical with PCP -Call in 1 month with progress report on Xanax and next steps -Follow-up in 6 months or sooner if needed  I spent 20 minutes of face-to-face and non-face-to-face time with patient.  This included previsit chart review, lab review, study review, order entry, electronic health record documentation, patient education.  Margie Ege, AGNP-C, DNP 02/29/2020, 9:18 AM [facility] [address] [place], [place] [address] [phone]

## 2020-02-29 NOTE — Patient Instructions (Signed)
Try taking the Xanax twice daily for tremor  If not helpful after 1 month, let me know, we can consider other options  See you back in 6 months

## 2020-03-03 ENCOUNTER — Ambulatory Visit: Payer: Self-pay | Admitting: Neurology

## 2020-03-15 ENCOUNTER — Telehealth: Payer: Self-pay | Admitting: Neurology

## 2020-03-15 MED ORDER — PROPRANOLOL HCL 20 MG PO TABS: 20.0000 mg | ORAL_TABLET | Freq: Two times a day (BID) | ORAL | 6 refills | Status: DC

## 2020-03-15 NOTE — Telephone Encounter (Signed)
Spoke to pt  He states he is currently taking Alprazolam 0.5 MG tab twice a day and it makes him feel very drowsy throughout the day. He also states tremors have not improved. He would like to know what Karren Burly NP recommends.

## 2020-03-15 NOTE — Telephone Encounter (Signed)
If xanax is not helpful we can try propanolol, is BP medication used for tremor symptomatic management, will try low-dose 20 mg twice a day.  Can still take Xanax as needed for tremor. Watch out for fatigue, low HR. Should be seeing PCP soon, BP was elevated. If it is helpful, we can go higher on the dosing. Let me know if he wants to try it.

## 2020-03-15 NOTE — Addendum Note (Signed)
Addended by: Glean Salvo on: 03/15/2020 02:47 PM   Modules accepted: Orders

## 2020-03-15 NOTE — Telephone Encounter (Signed)
Pt called, ALPRAZolam (XANAX) 0.5 MG tablet is not working. Would like to speak with the nurse.

## 2020-03-15 NOTE — Telephone Encounter (Signed)
Pt agrees with plan, states he is scheduled with PCP And would [facility]

## 2020-07-06 DIAGNOSIS — M25775 Osteophyte, left foot: Secondary | ICD-10-CM | POA: Diagnosis not present

## 2020-07-06 DIAGNOSIS — B351 Tinea unguium: Secondary | ICD-10-CM | POA: Diagnosis not present

## 2020-07-06 DIAGNOSIS — L600 Ingrowing nail: Secondary | ICD-10-CM | POA: Diagnosis not present

## 2020-07-06 DIAGNOSIS — M25774 Osteophyte, right foot: Secondary | ICD-10-CM | POA: Diagnosis not present

## 2020-07-13 DIAGNOSIS — L600 Ingrowing nail: Secondary | ICD-10-CM | POA: Diagnosis not present

## 2020-07-21 DIAGNOSIS — L600 Ingrowing nail: Secondary | ICD-10-CM | POA: Diagnosis not present

## 2020-07-21 DIAGNOSIS — M25775 Osteophyte, left foot: Secondary | ICD-10-CM | POA: Diagnosis not present

## 2020-07-25 DIAGNOSIS — R69 Illness, unspecified: Secondary | ICD-10-CM | POA: Diagnosis not present

## 2020-07-28 DIAGNOSIS — B351 Tinea unguium: Secondary | ICD-10-CM | POA: Diagnosis not present

## 2020-08-04 DIAGNOSIS — B351 Tinea unguium: Secondary | ICD-10-CM | POA: Diagnosis not present

## 2020-08-29 ENCOUNTER — Encounter: Payer: Self-pay | Admitting: Neurology

## 2020-08-29 ENCOUNTER — Ambulatory Visit: Payer: Medicare HMO | Admitting: Neurology

## 2020-08-29 VITALS — BP 171/87 | HR 51 | Ht 75.0 in | Wt 268.0 lb

## 2020-08-29 DIAGNOSIS — G250 Essential tremor: Secondary | ICD-10-CM

## 2020-08-29 MED ORDER — PRIMIDONE 50 MG PO TABS: ORAL_TABLET | ORAL | 5 refills | Status: DC

## 2020-08-29 NOTE — Progress Notes (Signed)
I have read the note, and I agree with the clinical assessment and plan.  Delano Scardino K Deren Degrazia   

## 2020-08-29 NOTE — Progress Notes (Signed)
PATIENT: Timothy Munoz DOB: [birth year]  REASON FOR VISIT: follow up HISTORY FROM: patient  HISTORY OF PRESENT ILLNESS: Today 08/29/20 Timothy Munoz is a 75 year old male with history of tremor affecting both upper extremities.  His mother had Alzheimer's disease and Parkinson's disease with tremor.  His tremor worsens over time, has tremor with action, most problematic with handwriting, eating or drinking.  Tremor is greater in the right hand than the left.  When taking Xanax twice daily, resulted in drowsiness.  Currently on propanolol 20 mg in AM, thinks it is somewhat helpful, but heart rate is 51.  He lives alone, remains active.  Is retired.  Is due to see his primary care doctor.  Has had blood work done recently, reports was normal.  Here today for evaluation unaccompanied.  Update 02/29/2020 SS: Timothy Munoz is a 75 year old male with history of tremor affecting both upper extremities.  Reportedly his mother had Alzheimer's disease, and Parkinson's disease with tremor.  He takes Xanax as needed for tremor, never more than once a day, but he thinks he helps when he takes it.  The tremor is gradually progressing over time.  There are times he has significant difficulty with eating, stopped for spaghetti recently, had terrible time.  Sometimes holding the steering well with his right hand, will notice tremoring, or holding something with a tight grip, affects handwriting.  Tremor more in the right hand than left.  No falls, balance is okay.  He is retired, but remains active.  His overall health is good.  Presents today for evaluation unaccompanied.  HISTORY 03/04/2019 Dr. Anne Hahn: Timothy Munoz is a 75 year old right-handed white male with a history of a tremor affecting both upper extremities for greater than 17 years.  The patient claims that his mother had Alzheimer's disease but she also had tremor but was told she had Parkinson's disease.  He knows of no other family members who have had  tremor, he has 1 sister.  The patient indicates that over time, the tremor has gradually worsened.  He is having increasing problems with handwriting and with feeding himself, he likes to do a lot of fishing but has trouble tying the knots on the fishing line.  The patient does not have any tremor when resting the arms.  He reports no tremors associated with the head or neck or with the voice.  He reports some mild gait instability problems but no problems with falling.  He does have urinary frequency but no incontinence.  He reports no significant numbness or weakness of the extremities.  He comes to this office for an evaluation.   REVIEW OF SYSTEMS: Out of a complete 14 system review of symptoms, the patient complains only of the following symptoms, and all other reviewed systems are negative.  Tremor  ALLERGIES: No Known Allergies  HOME MEDICATIONS: Outpatient Medications Prior to Visit  Medication Sig Dispense Refill  . losartan (COZAAR) 50 MG tablet Take 50 mg by mouth daily.    . Omega-3 Fatty Acids (FISH OIL) 1000 MG CAPS Take by mouth.    . terbinafine (LAMISIL) 250 MG tablet     . propranolol (INDERAL) 20 MG tablet Take 1 tablet (20 mg total) by mouth 2 (two) times daily. 60 tablet 6  . ALPRAZolam (XANAX) 0.5 MG tablet Take 1 tablet (0.5 mg total) by mouth 3 (three) times daily as needed (tremor). 60 tablet 0   No facility-administered medications prior to visit.    PAST MEDICAL HISTORY:  Past Medical History:  Diagnosis Date  . Hyperlipidemia    mild   . Hypertension   . Knee pain   . Obesity   . Tremor   . Tremor, essential 03/04/2019    PAST SURGICAL HISTORY: Past Surgical History:  Procedure Laterality Date  . APPENDECTOMY    . BACK SURGERY    . HEMORRHOID SURGERY      FAMILY HISTORY: Family History  Problem Relation Age of Onset  . Hypertension Mother   . Parkinson's disease Mother   . Alzheimer's disease Mother   . Hypertension Father   . Suicidality  Father     SOCIAL HISTORY: Social History   Socioeconomic History  . Marital status: Single    Spouse name: Not on file  . Number of children: Not on file  . Years of education: Not on file  . Highest education level: Not on file  Occupational History  . Occupation: retired    Comment: Pharmacist, hospital  Tobacco Use  . Smoking status: Former Smoker    Quit date: 06/01/1980    Years since quitting: 40.2  . Smokeless tobacco: Current User    Types: Chew  Substance and Sexual Activity  . Alcohol use: Yes    Comment: one beer every two months  . Drug use: No  . Sexual activity: Not on file  Other Topics Concern  . Not on file  Social History Narrative   Right handed    Social Determinants of Health   Financial Resource Strain: Not on file  Food Insecurity: Not on file  Transportation Needs: Not on file  Physical Activity: Not on file  Stress: Not on file  Social Connections: Not on file  Intimate Partner Violence: Not on file   PHYSICAL EXAM  Vitals:   08/29/20 0904  BP: (!) 171/87  Pulse: (!) 51  Weight: 268 lb (121.6 kg)  Height: 6\' 3"  (1.905 m)   Body mass index is 33.5 kg/m. Generalized: Well developed, in no acute distress  Neurological examination  Mentation: Alert oriented to time, place, history taking. Follows all commands speech and language fluent Cranial nerve II-XII: Pupils were equal round reactive to light. Extraocular movements were full, visual field were full on confrontational test. Facial sensation and strength were normal.  Head turning and shoulder shrug  were normal and symmetric. Motor: The motor testing reveals 5 over 5 strength of all 4 extremities. Good symmetric motor tone is noted throughout.  No bradykinesia. Sensory: Sensory testing is intact to soft touch on all 4 extremities. No evidence of extinction is noted.  Coordination: Cerebellar testing reveals good finger-nose-finger and heel-to-shin bilaterally.  Mild to moderate intention  tremor with finger-nose-finger bilaterally.  Tremor is translated moderately into handwriting sample and spiral drawl. Gait and station: Gait is normal, good strides, turns, no tremor noted.  DIAGNOSTIC DATA (LABS, IMAGING, TESTING) - I reviewed patient records, labs, notes, testing and imaging myself where available.  No results found for: WBC, HGB, HCT, MCV, PLT    Component Value Date/Time   NA 139 12/24/2016 1015   K 4.7 12/24/2016 1015   CL 101 12/24/2016 1015   CO2 25 12/24/2016 1015   GLUCOSE 103 (H) 12/24/2016 1015   BUN 19 12/24/2016 1015   CREATININE 1.17 12/24/2016 1015   CALCIUM 9.8 12/24/2016 1015   PROT 7.2 12/24/2016 1015   ALBUMIN 4.7 12/24/2016 1015   AST 16 12/24/2016 1015   ALT 14 12/24/2016 1015   ALKPHOS 170 (H) 12/24/2016  1015   BILITOT 0.5 12/24/2016 1015   No results found for: CHOL, HDL, LDLCALC, LDLDIRECT, TRIG, CHOLHDL No results found for: TKPT4S No results found for: VITAMINB12 Lab Results  Component Value Date   TSH 2.45 12/24/2016   ASSESSMENT AND PLAN 75 y.o. year old male  has a past medical history of Hyperlipidemia, Hypertension, Knee pain, Obesity, Tremor, and Tremor, essential (03/04/2019). here with:  1.  Essential tremor -Bilateral upper extremities, R > L, progressing over time -Stop propanolol 20 mg once daily, heart rate 51, worry higher dose would result in more bradycardia -Add on primidone 50 mg, start taking 1/2 tablet at bedtime for 1 week, then take 1 tablet at bedtime, call for dose adjustment -On Xanax 0.5 mg twice daily, felt too drowsy -Recommended seeing PCP for annual physical, BP is elevated 171/87 -Follow-up in 6 months or sooner if needed, to ensure tolerability and benefit of primidone, check labs if none have been done by PCP  I spent 20 minutes of face-to-face and non-face-to-face time with patient.  This included previsit chart review, lab review, study review, order entry, electronic health record documentation,  patient education.  Margie Ege, AGNP-C, DNP 08/29/2020, 9:48 AM [facility] [address], [address] [place], [place] [address] [phone]

## 2020-08-29 NOTE — Patient Instructions (Addendum)
Start taking primidone 50 mg, 1/2 tablet at bedtime for 1 week, then take 1 tablet at bedtime  Stop the propranolol, due to low heart rate  Let me know of any side effects  See you back in 6 months   Primidone tablets What is this medicine? PRIMIDONE (PRI mi done) is a barbiturate. This medicine is used to control seizures in certain types of epilepsy. It is not for use in absence (petit mal) seizures. This medicine may be used for other purposes; ask your health care provider or pharmacist if you have questions. COMMON BRAND NAME(S): Mysoline What should I tell my health care provider before I take this medicine? They need to know if you have any of these conditions:  kidney disease  liver disease  porphyria  suicidal thoughts, plans, or attempt; a previous suicide attempt by you or a family member  an unusual or allergic reaction to primidone, phenobarbital, other barbiturates or seizure medications, other medicines, foods, dyes, or preservatives  pregnant or trying to get pregnant  breast-feeding How should I use this medicine? Take this medicine by mouth with a glass of water. Follow the directions on the prescription label. Take your doses at regular intervals. Do not take your medicine more often than directed. Do not stop taking except on the advice of your doctor or health care professional. A special MedGuide will be given to you by the pharmacist with each prescription and refill. Be sure to read this information carefully each time. Contact your pediatrician or health care professional regarding the use of this medication in children. Special care may be needed. While this drug may be prescribed for children for selected conditions, precautions do apply. Overdosage: If you think you have taken too much of this medicine contact a poison control center or emergency room at once. NOTE: This medicine is only for you. Do not share this medicine with others. What if I miss a  dose? If you miss a dose, take it as soon as you can. If it is almost time for your next dose, take only that dose. Do not take double or extra doses. What may interact with this medicine? Do not take this medicine with any of the following medications:  voriconazole This medicine may also interact with the following medications:  cancer-treating medications  cyclosporine  disopyramide  doxycycline  male hormones, including contraceptive or birth control pills  medicines for mental depression, anxiety or other mood problems  medicines for treating HIV infection or AIDS  modafinil  prescription pain medications  quinidine  warfarin This list may not describe all possible interactions. Give your health care provider a list of all the medicines, herbs, non-prescription drugs, or dietary supplements you use. Also tell them if you smoke, drink alcohol, or use illegal drugs. Some items may interact with your medicine. What should I watch for while using this medicine? Visit your doctor or health care professional for regular checks on your progress. It may be 2 to 3 weeks before you see the full effects of this medicine. Do not suddenly stop taking this medicine, you may increase the risk of seizures. Your doctor or health care professional may want to gradually reduce the dose. Wear a medical identification bracelet or chain to say you have epilepsy, and carry a card that lists all your medications. You may get drowsy or dizzy. Do not drive, use machinery, or do anything that needs mental alertness until you know how this medicine affects you. Do not  stand or sit up quickly, especially if you are an older patient. This reduces the risk of dizzy or fainting spells. Alcohol may interfere with the effect of this medicine. Avoid alcoholic drinks. Birth control pills may not work properly while you are taking this medicine. Talk to your doctor about using an extra method of birth  control. The use of this medicine may increase the chance of suicidal thoughts or actions. Pay special attention to how you are responding while on this medicine. Any worsening of mood, or thoughts of suicide or dying should be reported to your health care professional right away. Women who become pregnant while using this medicine may enroll in the [place] American Antiepileptic Drug Pregnancy Registry by calling [phone]. This registry collects information about the safety of antiepileptic drug use during pregnancy. This medicine may cause a decrease in vitamin D and folic acid. You should make sure that you get enough vitamins while you are taking this medicine. Discuss the foods you eat and the vitamins you take with your health care professional. What side effects may I notice from receiving this medicine? Side effects that you should report to your doctor or health care professional as soon as possible:  allergic reactions like skin rash, itching or hives, swelling of the face, lips, or tongue  blurred, double vision, or uncontrollable rolling or movements of the eyes  redness, blistering, peeling or loosening of the skin, including inside the mouth  shortness of breath or difficulty breathing  unusual excitement or restlessness, more likely in children and the elderly  unusually weak or tired  worsening of mood, thoughts or actions of suicide or dying Side effects that usually do not require medical attention (report to your doctor or health care professional if they continue or are bothersome):  clumsiness, unsteadiness, or a hang-over effect  decreased sexual ability  dizziness, drowsiness  loss of appetite  nausea or vomiting This list may not describe all possible side effects. Call your doctor for medical advice about side effects. You may report side effects to FDA at 1-800-FDA-1088. Where should I keep my medicine? Keep out of the reach of children. This medicine  may cause accidental overdose and death if it taken by other adults, children, or pets. Mix any unused medicine with a substance like cat litter or coffee grounds. Then throw the medicine away in a sealed container like a sealed bag or a coffee can with a lid. Do not use the medicine after the expiration date. Store at room temperature between 15 and 30 degrees C (59 and 86 degrees F). NOTE: This sheet is a summary. It may not cover all possible information. If you have questions about this medicine, talk to your doctor, pharmacist, or health care provider.  2021 Elsevier/Gold Standard (2016-12-12 15:21:47)

## 2020-08-30 DIAGNOSIS — Z125 Encounter for screening for malignant neoplasm of prostate: Secondary | ICD-10-CM | POA: Diagnosis not present

## 2020-08-30 DIAGNOSIS — Z1211 Encounter for screening for malignant neoplasm of colon: Secondary | ICD-10-CM | POA: Diagnosis not present

## 2020-08-30 DIAGNOSIS — I10 Essential (primary) hypertension: Secondary | ICD-10-CM | POA: Diagnosis not present

## 2020-08-30 DIAGNOSIS — E785 Hyperlipidemia, unspecified: Secondary | ICD-10-CM | POA: Diagnosis not present

## 2020-08-30 DIAGNOSIS — Z0000 Encounter for general adult medical examination without abnormal findings: Secondary | ICD-10-CM | POA: Diagnosis not present

## 2020-08-31 DIAGNOSIS — B351 Tinea unguium: Secondary | ICD-10-CM | POA: Diagnosis not present

## 2020-09-20 DIAGNOSIS — R3914 Feeling of incomplete bladder emptying: Secondary | ICD-10-CM | POA: Diagnosis not present

## 2020-09-20 DIAGNOSIS — R350 Frequency of micturition: Secondary | ICD-10-CM | POA: Diagnosis not present

## 2020-09-20 DIAGNOSIS — N401 Enlarged prostate with lower urinary tract symptoms: Secondary | ICD-10-CM | POA: Diagnosis not present

## 2020-09-28 DIAGNOSIS — B351 Tinea unguium: Secondary | ICD-10-CM | POA: Diagnosis not present

## 2020-10-11 DIAGNOSIS — N401 Enlarged prostate with lower urinary tract symptoms: Secondary | ICD-10-CM | POA: Diagnosis not present

## 2020-10-11 DIAGNOSIS — E785 Hyperlipidemia, unspecified: Secondary | ICD-10-CM | POA: Diagnosis not present

## 2020-10-11 DIAGNOSIS — I10 Essential (primary) hypertension: Secondary | ICD-10-CM | POA: Diagnosis not present

## 2020-12-01 DIAGNOSIS — I10 Essential (primary) hypertension: Secondary | ICD-10-CM | POA: Diagnosis not present

## 2020-12-01 DIAGNOSIS — E785 Hyperlipidemia, unspecified: Secondary | ICD-10-CM | POA: Diagnosis not present

## 2020-12-08 DIAGNOSIS — R350 Frequency of micturition: Secondary | ICD-10-CM | POA: Diagnosis not present

## 2020-12-08 DIAGNOSIS — E785 Hyperlipidemia, unspecified: Secondary | ICD-10-CM | POA: Diagnosis not present

## 2020-12-28 DIAGNOSIS — B351 Tinea unguium: Secondary | ICD-10-CM | POA: Diagnosis not present

## 2021-01-16 ENCOUNTER — Inpatient Hospital Stay (HOSPITAL_COMMUNITY)
Admission: EM | Admit: 2021-01-16 | Discharge: 2021-01-30 | DRG: 231 | Disposition: A | Discharge status: Home / Self Care | Payer: Medicare HMO | Attending: Thoracic Surgery (Cardiothoracic Vascular Surgery) | Admitting: Thoracic Surgery (Cardiothoracic Vascular Surgery)

## 2021-01-16 ENCOUNTER — Inpatient Hospital Stay (HOSPITAL_COMMUNITY): Payer: Medicare HMO

## 2021-01-16 ENCOUNTER — Encounter (HOSPITAL_COMMUNITY)
Admission: EM | Disposition: A | Discharge status: Home / Self Care | Payer: Self-pay | Source: Home / Self Care | Attending: Thoracic Surgery (Cardiothoracic Vascular Surgery)

## 2021-01-16 ENCOUNTER — Encounter (HOSPITAL_COMMUNITY): Payer: Self-pay | Admitting: Cardiovascular Disease

## 2021-01-16 ENCOUNTER — Other Ambulatory Visit: Payer: Self-pay

## 2021-01-16 DIAGNOSIS — I97190 Other postprocedural cardiac functional disturbances following cardiac surgery: Secondary | ICD-10-CM | POA: Diagnosis not present

## 2021-01-16 DIAGNOSIS — Z951 Presence of aortocoronary bypass graft: Secondary | ICD-10-CM

## 2021-01-16 DIAGNOSIS — I472 Ventricular tachycardia: Secondary | ICD-10-CM | POA: Diagnosis not present

## 2021-01-16 DIAGNOSIS — I480 Paroxysmal atrial fibrillation: Secondary | ICD-10-CM | POA: Diagnosis not present

## 2021-01-16 DIAGNOSIS — I2510 Atherosclerotic heart disease of native coronary artery without angina pectoris: Secondary | ICD-10-CM | POA: Diagnosis present

## 2021-01-16 DIAGNOSIS — R7303 Prediabetes: Secondary | ICD-10-CM | POA: Diagnosis present

## 2021-01-16 DIAGNOSIS — Z48812 Encounter for surgical aftercare following surgery on the circulatory system: Secondary | ICD-10-CM | POA: Diagnosis not present

## 2021-01-16 DIAGNOSIS — I4510 Unspecified right bundle-branch block: Secondary | ICD-10-CM | POA: Diagnosis not present

## 2021-01-16 DIAGNOSIS — T508X5A Adverse effect of diagnostic agents, initial encounter: Secondary | ICD-10-CM | POA: Diagnosis not present

## 2021-01-16 DIAGNOSIS — Z23 Encounter for immunization: Secondary | ICD-10-CM | POA: Diagnosis not present

## 2021-01-16 DIAGNOSIS — D62 Acute posthemorrhagic anemia: Secondary | ICD-10-CM | POA: Diagnosis not present

## 2021-01-16 DIAGNOSIS — Z87891 Personal history of nicotine dependence: Secondary | ICD-10-CM | POA: Diagnosis not present

## 2021-01-16 DIAGNOSIS — Z4682 Encounter for fitting and adjustment of non-vascular catheter: Secondary | ICD-10-CM | POA: Diagnosis not present

## 2021-01-16 DIAGNOSIS — J90 Pleural effusion, not elsewhere classified: Secondary | ICD-10-CM | POA: Diagnosis not present

## 2021-01-16 DIAGNOSIS — R0902 Hypoxemia: Secondary | ICD-10-CM | POA: Diagnosis not present

## 2021-01-16 DIAGNOSIS — Z01810 Encounter for preprocedural cardiovascular examination: Secondary | ICD-10-CM | POA: Diagnosis not present

## 2021-01-16 DIAGNOSIS — J984 Other disorders of lung: Secondary | ICD-10-CM | POA: Diagnosis not present

## 2021-01-16 DIAGNOSIS — E785 Hyperlipidemia, unspecified: Secondary | ICD-10-CM | POA: Diagnosis present

## 2021-01-16 DIAGNOSIS — Y832 Surgical operation with anastomosis, bypass or graft as the cause of abnormal reaction of the patient, or of later complication, without mention of misadventure at the time of the procedure: Secondary | ICD-10-CM | POA: Diagnosis not present

## 2021-01-16 DIAGNOSIS — N178 Other acute kidney failure: Secondary | ICD-10-CM | POA: Diagnosis not present

## 2021-01-16 DIAGNOSIS — R0789 Other chest pain: Secondary | ICD-10-CM | POA: Diagnosis not present

## 2021-01-16 DIAGNOSIS — Z6833 Body mass index (BMI) 33.0-33.9, adult: Secondary | ICD-10-CM | POA: Diagnosis not present

## 2021-01-16 DIAGNOSIS — M810 Age-related osteoporosis without current pathological fracture: Secondary | ICD-10-CM | POA: Diagnosis present

## 2021-01-16 DIAGNOSIS — Z4509 Encounter for adjustment and management of other cardiac device: Secondary | ICD-10-CM

## 2021-01-16 DIAGNOSIS — I213 ST elevation (STEMI) myocardial infarction of unspecified site: Secondary | ICD-10-CM | POA: Diagnosis present

## 2021-01-16 DIAGNOSIS — Z20822 Contact with and (suspected) exposure to covid-19: Secondary | ICD-10-CM | POA: Diagnosis not present

## 2021-01-16 DIAGNOSIS — Z820 Family history of epilepsy and other diseases of the nervous system: Secondary | ICD-10-CM

## 2021-01-16 DIAGNOSIS — N179 Acute kidney failure, unspecified: Secondary | ICD-10-CM | POA: Diagnosis not present

## 2021-01-16 DIAGNOSIS — I110 Hypertensive heart disease with heart failure: Secondary | ICD-10-CM | POA: Diagnosis not present

## 2021-01-16 DIAGNOSIS — E669 Obesity, unspecified: Secondary | ICD-10-CM | POA: Diagnosis present

## 2021-01-16 DIAGNOSIS — I2102 ST elevation (STEMI) myocardial infarction involving left anterior descending coronary artery: Secondary | ICD-10-CM

## 2021-01-16 DIAGNOSIS — N141 Nephropathy induced by other drugs, medicaments and biological substances: Secondary | ICD-10-CM | POA: Diagnosis not present

## 2021-01-16 DIAGNOSIS — J9811 Atelectasis: Secondary | ICD-10-CM | POA: Diagnosis not present

## 2021-01-16 DIAGNOSIS — Z8249 Family history of ischemic heart disease and other diseases of the circulatory system: Secondary | ICD-10-CM

## 2021-01-16 DIAGNOSIS — E7800 Pure hypercholesterolemia, unspecified: Secondary | ICD-10-CM | POA: Diagnosis not present

## 2021-01-16 DIAGNOSIS — R570 Cardiogenic shock: Secondary | ICD-10-CM | POA: Diagnosis not present

## 2021-01-16 DIAGNOSIS — I10 Essential (primary) hypertension: Secondary | ICD-10-CM | POA: Diagnosis not present

## 2021-01-16 DIAGNOSIS — R079 Chest pain, unspecified: Secondary | ICD-10-CM

## 2021-01-16 DIAGNOSIS — G250 Essential tremor: Secondary | ICD-10-CM | POA: Diagnosis not present

## 2021-01-16 DIAGNOSIS — I517 Cardiomegaly: Secondary | ICD-10-CM | POA: Diagnosis not present

## 2021-01-16 DIAGNOSIS — I340 Nonrheumatic mitral (valve) insufficiency: Secondary | ICD-10-CM | POA: Diagnosis not present

## 2021-01-16 DIAGNOSIS — I5021 Acute systolic (congestive) heart failure: Secondary | ICD-10-CM | POA: Diagnosis present

## 2021-01-16 DIAGNOSIS — J939 Pneumothorax, unspecified: Secondary | ICD-10-CM

## 2021-01-16 DIAGNOSIS — I214 Non-ST elevation (NSTEMI) myocardial infarction: Secondary | ICD-10-CM | POA: Diagnosis not present

## 2021-01-16 DIAGNOSIS — I25110 Atherosclerotic heart disease of native coronary artery with unstable angina pectoris: Secondary | ICD-10-CM | POA: Diagnosis not present

## 2021-01-16 DIAGNOSIS — R918 Other nonspecific abnormal finding of lung field: Secondary | ICD-10-CM | POA: Diagnosis not present

## 2021-01-16 HISTORY — PX: LEFT HEART CATH AND CORONARY ANGIOGRAPHY: CATH118249

## 2021-01-16 HISTORY — PX: CORONARY/GRAFT ACUTE MI REVASCULARIZATION: CATH118305

## 2021-01-16 HISTORY — PX: IABP INSERTION: CATH118242

## 2021-01-16 LAB — POCT I-STAT, CHEM 8
BUN: 24 mg/dL — ABNORMAL HIGH (ref 8–23)
Calcium, Ion: 1.2 mmol/L (ref 1.15–1.40)
Chloride: 108 mmol/L (ref 98–111)
Creatinine, Ser: 1 mg/dL (ref 0.61–1.24)
Glucose, Bld: 146 mg/dL — ABNORMAL HIGH (ref 70–99)
HCT: 41 % (ref 39.0–52.0)
Hemoglobin: 13.9 g/dL (ref 13.0–17.0)
Potassium: 4 mmol/L (ref 3.5–5.1)
Sodium: 141 mmol/L (ref 135–145)
TCO2: 21 mmol/L — ABNORMAL LOW (ref 22–32)

## 2021-01-16 LAB — LIPID PANEL
Cholesterol: 163 mg/dL (ref 0–200)
HDL: 42 mg/dL (ref >40)
LDL Cholesterol: 114 mg/dL — ABNORMAL HIGH (ref 0–99)
Total CHOL/HDL Ratio: 3.9 RATIO
Triglycerides: 33 mg/dL (ref <150)
VLDL: 7 mg/dL (ref 0–40)

## 2021-01-16 LAB — TSH: TSH: 1.959 u[IU]/mL (ref 0.350–4.500)

## 2021-01-16 LAB — COMPREHENSIVE METABOLIC PANEL
ALT: 86 U/L — ABNORMAL HIGH (ref 0–44)
AST: 570 U/L — ABNORMAL HIGH (ref 15–41)
Albumin: 3.7 g/dL (ref 3.5–5.0)
Alkaline Phosphatase: 133 U/L — ABNORMAL HIGH (ref 38–126)
Anion gap: 7 (ref 5–15)
BUN: 23 mg/dL (ref 8–23)
CO2: 21 mmol/L — ABNORMAL LOW (ref 22–32)
Calcium: 8.8 mg/dL — ABNORMAL LOW (ref 8.9–10.3)
Chloride: 109 mmol/L (ref 98–111)
Creatinine, Ser: 1.19 mg/dL (ref 0.61–1.24)
GFR, Estimated: >60 mL/min (ref >60)
Glucose, Bld: 150 mg/dL — ABNORMAL HIGH (ref 70–99)
Potassium: 4.1 mmol/L (ref 3.5–5.1)
Sodium: 137 mmol/L (ref 135–145)
Total Bilirubin: 0.5 mg/dL (ref 0.3–1.2)
Total Protein: 6.4 g/dL — ABNORMAL LOW (ref 6.5–8.1)

## 2021-01-16 LAB — MAGNESIUM: Magnesium: 2.2 mg/dL (ref 1.7–2.4)

## 2021-01-16 LAB — RESP PANEL BY RT-PCR (FLU A&B, COVID) ARPGX2
Influenza A by PCR: NEGATIVE
Influenza B by PCR: NEGATIVE
SARS Coronavirus 2 by RT PCR: NEGATIVE

## 2021-01-16 LAB — PROTIME-INR
INR: 1.2 (ref 0.8–1.2)
Prothrombin Time: 14.8 seconds (ref 11.4–15.2)

## 2021-01-16 LAB — BRAIN NATRIURETIC PEPTIDE: B Natriuretic Peptide: 44.2 pg/mL (ref 0.0–100.0)

## 2021-01-16 LAB — HEMOGLOBIN A1C
Hgb A1c MFr Bld: 6 % — ABNORMAL HIGH (ref 4.8–5.6)
Mean Plasma Glucose: 125.5 mg/dL

## 2021-01-16 LAB — HEPARIN LEVEL (UNFRACTIONATED): Heparin Unfractionated: <0.1 IU/mL — ABNORMAL LOW (ref 0.30–0.70)

## 2021-01-16 LAB — MRSA NEXT GEN BY PCR, NASAL: MRSA by PCR Next Gen: NOT DETECTED

## 2021-01-16 LAB — ECHOCARDIOGRAM COMPLETE
Area-P 1/2: 3.3 cm2
Height: 75 in
S' Lateral: 3.1 cm
Single Plane A2C EF: 43.3 %
Weight: 4160 oz

## 2021-01-16 LAB — POCT ACTIVATED CLOTTING TIME
Activated Clotting Time: 242 seconds
Activated Clotting Time: 283 seconds
Activated Clotting Time: 509 seconds

## 2021-01-16 LAB — GLUCOSE, CAPILLARY
Glucose-Capillary: 152 mg/dL — ABNORMAL HIGH (ref 70–99)
Glucose-Capillary: 105 mg/dL — ABNORMAL HIGH (ref 70–99)
Glucose-Capillary: 111 mg/dL — ABNORMAL HIGH (ref 70–99)

## 2021-01-16 LAB — TROPONIN I (HIGH SENSITIVITY)
Troponin I (High Sensitivity): >24000 ng/L (ref <18)
Troponin I (High Sensitivity): >24000 ng/L (ref <18)

## 2021-01-16 LAB — T4, FREE: Free T4: 1.05 ng/dL (ref 0.61–1.12)

## 2021-01-16 LAB — APTT: aPTT: >200 seconds (ref 24–36)

## 2021-01-16 SURGERY — CORONARY/GRAFT ACUTE MI REVASCULARIZATION
Anesthesia: LOCAL

## 2021-01-16 MED ORDER — SODIUM CHLORIDE 0.9 % IV SOLN
INTRAVENOUS | Status: AC | PRN
  Administered 2021-01-16: 10 mL/h via INTRAVENOUS

## 2021-01-16 MED ORDER — MIDAZOLAM HCL 2 MG/2ML IJ SOLN
INTRAMUSCULAR | Status: DC | PRN
  Administered 2021-01-16: 1 mg via INTRAVENOUS

## 2021-01-16 MED ORDER — NOREPINEPHRINE 4 MG/250ML-% IV SOLN
INTRAVENOUS | Status: AC
  Filled 2021-01-16: qty 250

## 2021-01-16 MED ORDER — SODIUM CHLORIDE 0.9% FLUSH
3.0000 mL | Freq: Two times a day (BID) | INTRAVENOUS | Status: DC
  Administered 2021-01-16 – 2021-01-18 (×2): 3 mL via INTRAVENOUS

## 2021-01-16 MED ORDER — CHLORHEXIDINE GLUCONATE CLOTH 2 % EX PADS
6.0000 | MEDICATED_PAD | Freq: Every day | CUTANEOUS | Status: DC
  Administered 2021-01-16 – 2021-01-20 (×5): 6 via TOPICAL

## 2021-01-16 MED ORDER — HYDRALAZINE HCL 20 MG/ML IJ SOLN: 10.0000 mg | INTRAMUSCULAR | Status: AC | PRN

## 2021-01-16 MED ORDER — ACETAMINOPHEN 325 MG PO TABS
650.0000 mg | ORAL_TABLET | ORAL | Status: DC | PRN
  Administered 2021-01-18: 650 mg via ORAL
  Filled 2021-01-16: qty 2

## 2021-01-16 MED ORDER — HEPARIN (PORCINE) 25000 UT/250ML-% IV SOLN
1900.0000 [IU]/h | INTRAVENOUS | Status: DC
  Administered 2021-01-16: 1000 [IU]/h via INTRAVENOUS
  Administered 2021-01-17: 1300 [IU]/h via INTRAVENOUS
  Administered 2021-01-18 – 2021-01-20 (×4): 1900 [IU]/h via INTRAVENOUS
  Filled 2021-01-16 (×6): qty 250

## 2021-01-16 MED ORDER — HEPARIN (PORCINE) IN NACL 1000-0.9 UT/500ML-% IV SOLN
INTRAVENOUS | Status: AC
  Filled 2021-01-16: qty 1000

## 2021-01-16 MED ORDER — LIDOCAINE HCL (PF) 1 % IJ SOLN
INTRAMUSCULAR | Status: AC
  Filled 2021-01-16: qty 30

## 2021-01-16 MED ORDER — ATORVASTATIN CALCIUM 80 MG PO TABS
80.0000 mg | ORAL_TABLET | Freq: Every day | ORAL | Status: DC
  Administered 2021-01-16 – 2021-01-30 (×14): 80 mg via ORAL
  Filled 2021-01-16 (×14): qty 1

## 2021-01-16 MED ORDER — HEPARIN SODIUM (PORCINE) 1000 UNIT/ML IJ SOLN
INTRAMUSCULAR | Status: AC
  Filled 2021-01-16: qty 1

## 2021-01-16 MED ORDER — HEPARIN SODIUM (PORCINE) 1000 UNIT/ML IJ SOLN
INTRAMUSCULAR | Status: DC | PRN
  Administered 2021-01-16: 2000 [IU] via INTRAVENOUS
  Administered 2021-01-16: 3000 [IU] via INTRAVENOUS
  Administered 2021-01-16: 11000 [IU] via INTRAVENOUS

## 2021-01-16 MED ORDER — VERAPAMIL HCL 2.5 MG/ML IV SOLN
INTRAVENOUS | Status: AC
  Filled 2021-01-16: qty 2

## 2021-01-16 MED ORDER — ASPIRIN EC 81 MG PO TBEC
81.0000 mg | DELAYED_RELEASE_TABLET | Freq: Every day | ORAL | Status: DC
  Administered 2021-01-17 – 2021-01-19 (×3): 81 mg via ORAL
  Filled 2021-01-16 (×4): qty 1

## 2021-01-16 MED ORDER — VERAPAMIL HCL 2.5 MG/ML IV SOLN
INTRAVENOUS | Status: DC | PRN
  Administered 2021-01-16: 10 mL via INTRA_ARTERIAL

## 2021-01-16 MED ORDER — DIAZEPAM 5 MG PO TABS
5.0000 mg | ORAL_TABLET | ORAL | Status: DC | PRN
  Administered 2021-01-16 – 2021-01-19 (×5): 5 mg via ORAL
  Filled 2021-01-16 (×5): qty 1

## 2021-01-16 MED ORDER — SODIUM CHLORIDE 0.9 % IV SOLN: INTRAVENOUS | Status: AC

## 2021-01-16 MED ORDER — TICAGRELOR 90 MG PO TABS
ORAL_TABLET | ORAL | Status: AC
  Filled 2021-01-16: qty 2

## 2021-01-16 MED ORDER — FENTANYL CITRATE (PF) 100 MCG/2ML IJ SOLN
INTRAMUSCULAR | Status: AC
  Filled 2021-01-16: qty 2

## 2021-01-16 MED ORDER — NITROGLYCERIN 0.4 MG SL SUBL: 0.4000 mg | SUBLINGUAL_TABLET | SUBLINGUAL | Status: DC | PRN

## 2021-01-16 MED ORDER — NOREPINEPHRINE BITARTRATE 1 MG/ML IV SOLN
INTRAVENOUS | Status: AC | PRN
  Administered 2021-01-16: 8 ug/min via INTRAVENOUS

## 2021-01-16 MED ORDER — IOHEXOL 350 MG/ML SOLN
INTRAVENOUS | Status: DC | PRN
  Administered 2021-01-16: 195 mL via INTRA_ARTERIAL

## 2021-01-16 MED ORDER — LABETALOL HCL 5 MG/ML IV SOLN: 10.0000 mg | INTRAVENOUS | Status: AC | PRN

## 2021-01-16 MED ORDER — SODIUM CHLORIDE 0.9% FLUSH: 3.0000 mL | INTRAVENOUS | Status: DC | PRN

## 2021-01-16 MED ORDER — TICAGRELOR 90 MG PO TABS: 90.0000 mg | ORAL_TABLET | Freq: Two times a day (BID) | ORAL | Status: DC

## 2021-01-16 MED ORDER — HEPARIN (PORCINE) IN NACL 1000-0.9 UT/500ML-% IV SOLN
INTRAVENOUS | Status: DC | PRN
  Administered 2021-01-16 (×2): 500 mL

## 2021-01-16 MED ORDER — ATORVASTATIN CALCIUM 80 MG PO TABS: 80.0000 mg | ORAL_TABLET | Freq: Every day | ORAL | Status: DC

## 2021-01-16 MED ORDER — "THROMBI-PAD 3""X3"" EX PADS"
1.0000 | MEDICATED_PAD | Freq: Once | CUTANEOUS | Status: AC
  Administered 2021-01-16: 1 via TOPICAL
  Filled 2021-01-16: qty 1

## 2021-01-16 MED ORDER — TICAGRELOR 90 MG PO TABS
ORAL_TABLET | ORAL | Status: DC | PRN
  Administered 2021-01-16: 180 mg via ORAL

## 2021-01-16 MED ORDER — ONDANSETRON HCL 4 MG/2ML IJ SOLN: 4.0000 mg | Freq: Four times a day (QID) | INTRAMUSCULAR | Status: DC | PRN

## 2021-01-16 MED ORDER — LOSARTAN POTASSIUM 50 MG PO TABS
50.0000 mg | ORAL_TABLET | Freq: Every day | ORAL | Status: DC
  Filled 2021-01-16: qty 1

## 2021-01-16 MED ORDER — FENTANYL CITRATE (PF) 100 MCG/2ML IJ SOLN
INTRAMUSCULAR | Status: DC | PRN
  Administered 2021-01-16: 25 ug via INTRAVENOUS

## 2021-01-16 MED ORDER — METOPROLOL TARTRATE 12.5 MG HALF TABLET
12.5000 mg | ORAL_TABLET | Freq: Two times a day (BID) | ORAL | Status: DC
  Administered 2021-01-16 – 2021-01-18 (×5): 12.5 mg via ORAL
  Filled 2021-01-16 (×5): qty 1

## 2021-01-16 MED ORDER — PRIMIDONE 50 MG PO TABS
50.0000 mg | ORAL_TABLET | Freq: Every day | ORAL | Status: DC
  Administered 2021-01-16 – 2021-01-19 (×4): 50 mg via ORAL
  Filled 2021-01-16 (×4): qty 1

## 2021-01-16 MED ORDER — SODIUM CHLORIDE 0.9 % IV SOLN: 250.0000 mL | INTRAVENOUS | Status: DC | PRN

## 2021-01-16 MED ORDER — MIDAZOLAM HCL 2 MG/2ML IJ SOLN
INTRAMUSCULAR | Status: AC
  Filled 2021-01-16: qty 2

## 2021-01-16 MED ORDER — LIDOCAINE-EPINEPHRINE 1 %-1:100000 IJ SOLN
5.0000 mL | Freq: Once | INTRAMUSCULAR | Status: AC
  Administered 2021-01-16: 5 mL via INTRADERMAL

## 2021-01-16 MED ORDER — LIDOCAINE HCL (PF) 1 % IJ SOLN
INTRAMUSCULAR | Status: DC | PRN
  Administered 2021-01-16: 5 mL via SUBCUTANEOUS
  Administered 2021-01-16: 15 mL via SUBCUTANEOUS

## 2021-01-16 MED ORDER — ACETAMINOPHEN 325 MG PO TABS: 650.0000 mg | ORAL_TABLET | ORAL | Status: DC | PRN

## 2021-01-16 MED ORDER — ASPIRIN 81 MG PO CHEW: 81.0000 mg | CHEWABLE_TABLET | Freq: Every day | ORAL | Status: DC

## 2021-01-16 SURGICAL SUPPLY — 25 items
BALLN IABP SENSA PLUS 8F 50CC (BALLOONS) ×2
BALLN SAPPHIRE 2.0X12 (BALLOONS) ×2
BALLN SAPPHIRE 2.5X12 (BALLOONS) ×2
BALLN SAPPHIRE 2.75X15 (BALLOONS) ×2
BALLOON IABP SENS PLUS 8F 50CC (BALLOONS) ×1 IMPLANT
BALLOON SAPPHIRE 2.0X12 (BALLOONS) ×1 IMPLANT
BALLOON SAPPHIRE 2.5X12 (BALLOONS) ×1 IMPLANT
BALLOON SAPPHIRE 2.75X15 (BALLOONS) ×1 IMPLANT
CATH INFINITI JR4 5F (CATHETERS) ×2 IMPLANT
CATH LAUNCHER 6FR EBU3.5 (CATHETERS) ×2 IMPLANT
CATH OPTITORQUE TIG 4.0 5F (CATHETERS) ×2 IMPLANT
CATH VISTA GUIDE 6FR XBLAD3.5 (CATHETERS) ×2 IMPLANT
DEVICE RAD COMP TR BAND LRG (VASCULAR PRODUCTS) ×2 IMPLANT
ELECT DEFIB PAD ADLT CADENCE (PAD) ×2 IMPLANT
GLIDESHEATH SLEND SS 6F .021 (SHEATH) ×2 IMPLANT
GUIDEWIRE INQWIRE 1.5J.035X260 (WIRE) ×1 IMPLANT
INQWIRE 1.5J .035X260CM (WIRE) ×2
KIT ENCORE 26 ADVANTAGE (KITS) ×2 IMPLANT
KIT HEART LEFT (KITS) ×2 IMPLANT
PACK CARDIAC CATHETERIZATION (CUSTOM PROCEDURE TRAY) ×2 IMPLANT
SHEATH PROBE COVER 6X72 (BAG) ×4 IMPLANT
TRANSDUCER W/STOPCOCK (MISCELLANEOUS) ×2 IMPLANT
TUBING CIL FLEX 10 FLL-RA (TUBING) ×2 IMPLANT
WIRE COUGAR XT STRL 190CM (WIRE) ×2 IMPLANT
WIRE EMERALD 3MM-J .035X150CM (WIRE) ×2 IMPLANT

## 2021-01-16 NOTE — Progress Notes (Signed)
  Echocardiogram 2D Echocardiogram has been performed.  Timothy Munoz 01/16/2021, 11:04 AM

## 2021-01-16 NOTE — Progress Notes (Signed)
Chaplain followed up with Timothy Munoz after his cardiac event this morning. Chaplain asked open ended questions to facilitate story telling and emotional expression. Pt shared that he was asleep on the couch and awoke with significant arm pain and was sweating. He reports he is due to have a more extensive procedure, which he named open heart surgery later this week. He was hopeful it would be able to be done tomorrow, but was told the schedule is too full and he's likely to have his surgery on Thursday. [place] daughter and her boyfriend in a week or two and his worried the procedure and recovery time would prohibit the trip. Timothy Supple shared that he retired from a long career and now enjoys doing work around his house and supporting his family. He bragged about his adult granddaughter who is in law school at [employer] and shared about some family frustrations. Chaplain provided listening presence and facilitated pt in identifying sources of hope as he reframes his expectations for the coming weeks. Pt expressed gratitude for the visit.  Please page as further needs arise.  [name]. Carley Hammed, M.Div. [facility] Chaplain Pager [phone] Office [phone]     01/16/21 1000  Clinical Encounter Type  Visited With Patient  Visit Type Follow-up  Referral From Chaplain  Stress Factors  Patient Stress Factors Health changes;Loss of control;Major life changes

## 2021-01-16 NOTE — Progress Notes (Signed)
Additional Progress Note:  Pt now in CCU following emergent cath earlier today. No chest pain or shortness of breath  BP 129/71 with MAP of 100. Diastolic augmentation 137. Levophed is now off.  No JVD, lungs clear; RRR at 90, abd soft, no edema; 2 radial TR bands in place.  ECG shows resolution of previous significant ST elevation with QS V10-6. Plan 2d echo today.  Plan for surgical revascularization with Dr. Cliffton Asters later this week.   [name], MD  01/16/2021  8:53 AM

## 2021-01-16 NOTE — Progress Notes (Signed)
   Progress Note  We were called to see Mr. Struss who had some oozing at his IABP site  I evaluated the bleeding . Appeared to be slow oozing from the puncture site No hematoma No significant tenderness  I infiltrated the area with 4 cc Lidocaine wit Epi which appeared to slow the oozing   The nurse will re-dress the IABP insertion site.        Signed, Kristeen Miss, MD  01/16/2021, 7:28 PM

## 2021-01-16 NOTE — Progress Notes (Signed)
ANTICOAGULATION CONSULT NOTE  Pharmacy Consult for heparin Indication:  IABP  No Known Allergies  Patient Measurements: Height: 6\' 3"  (190.5 cm) Weight: 117.9 kg (260 lb) IBW/kg (Calculated) : 84.5 Heparin Dosing Weight: 100kg  Vital Signs: Temp: 98.1 F (36.7 C) (09/05 1900) Temp Source: Oral (09/05 1900) BP: 122/90 (09/05 2000) Pulse Rate: 76 (09/05 1900)  Labs: Recent Labs    01/16/21 0512 01/16/21 0739 01/16/21 0955 01/16/21 1749  HGB 13.9  --   --   --   HCT 41.0  --   --   --   APTT  --  >200*  --   --   LABPROT  --  14.8  --   --   INR  --  1.2  --   --   HEPARINUNFRC  --   --   --  <0.10*  CREATININE 1.00 1.19  --   --   TROPONINIHS  --  >24,000* >24,000*  --      Estimated Creatinine Clearance: 75.4 mL/min (by C-G formula based on SCr of 1.19 mg/dL).   Medical History: Past Medical History:  Diagnosis Date   Hyperlipidemia    mild    Hypertension    Knee pain    Obesity    Tremor    Tremor, essential 03/04/2019    Assessment: 80 yoM admitted as code STEMI s/pPOBA to LAD and  IABP placement > plan for CABG after ticagrelor washout post load.  Pharmacy asked to start IV heparin 4h after radial sheath removal due to hematoma present. No AC PTA. Heparin low dose drip started 1000 uts/hr, heparin level < 0.1 undetectable Increase slowly with small wrist hematoma and some ozzing at IABP site - resolved with lido/epi inj.   Goal of Therapy:  Heparin level 0.2-0.5 units/ml Monitor platelets by anticoagulation protocol: Yes   Plan:  Increase Heparin 1200 units/h no bolus  Daily heparin level and CBC    67 Pharm.D. CPP, BCPS Clinical Pharmacist [phone] 01/16/2021 8:09 PM   Please check AMION for all [facility] Pharmacy numbers 01/16/2021

## 2021-01-16 NOTE — Progress Notes (Signed)
EKG CRITICAL VALUE     12 lead EKG performed.  Critical value noted.  Cathie Beams, RN notified.   Marzella Schlein Rylin Seavey, CCT 01/16/2021 9:15 AM

## 2021-01-16 NOTE — H&P (Addendum)
Cardiology Admission History and Physical:   Patient ID: KUZEY OGATA MRN: [id number]; DOB: 05/10/1946   Admission date: 01/16/2021  PCP:  [name], MD   [facility] Providers Cardiologist:  None        Chief Complaint:  chest pain   Patient Profile:   Timothy Munoz is a 75 y.o. male with HTN, HLD, tremor who is being seen 01/16/2021 for the evaluation of chest pain and STEMI.  History of Present Illness:   Mr. Timothy Munoz 75 year old male with a history of hypertension, hyperlipidemia, tremor who presents with acute onset chest pain found to have an STEMI by EMS.  ST elevation in anterior ischemia pattern with elevations in V2 through V4 and reciprocal depressions in leads II, 3, aVF.  Woke up from sleep around 3 AM with acute onset left-sided chest pain, left-sided arm numbness, and diaphoresis.  Denies any symptoms prior to tonight.  Otherwise has been at his usual state of health.  Does not smoke and no significant family history that is contributory.  Arrival to the emergency department he is hemodynamically stable with blood pressures 140s over 80s.  Code STEMI activated and brought to the Cath Lab for emergent left heart catheterization.  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 Authorizing Provider  losartan (COZAAR) 50 MG tablet Take 50 mg by mouth daily.   Yes [provider]  Omega-3 Fatty Acids (FISH OIL) 1000 MG CAPS Take by mouth.   Yes [provider]  primidone (MYSOLINE) 50 MG tablet Take  1/2 tablet at bedtime for 1 week, then increase to 1 tablet at bedtime 08/29/20  Yes Glean Salvo, NP  terbinafine (LAMISIL) 250 MG tablet  07/06/20  Yes [provider]     Allergies:   No Known Allergies  Social History:   Social History   Socioeconomic History   Marital status: Single    Spouse name: Not on file   Number of children: Not on file   Years of education: Not on file   Highest education level: Not on file  Occupational History   Occupation: retired    Comment: Pharmacist, hospital  Tobacco Use   Smoking status: Former   Smokeless tobacco: Current    Types: Chew  Substance and Sexual Activity   Alcohol use: Yes    Comment: one beer every two months   Drug use: No   Sexual activity: Not on file  Other Topics Concern   Not on file  Social History Narrative   Right handed    Social Determinants of Health   Financial Resource Strain: Not on file  Food Insecurity: Not on file  Transportation Needs: Not on file  Physical Activity: Not on file  Stress: Not on file  Social Connections: Not on file  Intimate Partner Violence: Not on file    Family History:   The patient's family history includes Alzheimer's  disease in his mother; Hypertension in his father and mother; Parkinson's disease in his mother; Suicidality in his father.    ROS:  Please see the history of present illness.  All other ROS reviewed and negative.     Physical Exam/Data:   Vitals:   01/16/21 0500  SpO2: 100%   No intake or output data in the 24 hours ending 01/16/21 0512 Last 3 Weights 08/29/2020 02/29/2020 03/04/2019  Weight (lbs) 268 lb 265 lb 9.6 oz 257 lb  Weight (kg) 121.564 kg 120.475 kg 116.574 kg     There is no height or weight on file to calculate BMI.  General: No stretcher in mild distress. HEENT: normal Lymph: no adenopathy Neck: no JVD Endocrine:  No thryomegaly Vascular: No carotid bruits; FA pulses 2+ bilaterally without bruits.  2+ radial pulses  bilaterally. Cardiac:  normal S1, S2; RRR; no murmur  Lungs:  clear to auscultation bilaterally, no wheezing, rhonchi or rales  Abd: soft, nontender, no hepatomegaly  Ext: no edema Musculoskeletal:  No deformities, BUE and BLE strength normal and equal Skin: warm and dry  Neuro:  CNs 2-12 intact, no focal abnormalities noted Psych:  Normal affect    EKG:  The ECG that was done  was personally reviewed and demonstrates again ST elevation in leads V2, V3 and V4 with reciprocal depressions in leads II, III, aVF.  Relevant CV Studies: None  Laboratory Data:  High Sensitivity Troponin:  No results for input(s): TROPONINIHS in the last 720 hours.    ChemistryNo results for input(s): NA, K, CL, CO2, GLUCOSE, BUN, CREATININE, CALCIUM, GFRNONAA, GFRAA, ANIONGAP in the last 168 hours.  No results for input(s): PROT, ALBUMIN, AST, ALT, ALKPHOS, BILITOT in the last 168 hours. HematologyNo results for input(s): WBC, RBC, HGB, HCT, MCV, MCH, MCHC, RDW, PLT in the last 168 hours. BNPNo results for input(s): BNP, PROBNP in the last 168 hours.  DDimer No results for input(s): DDIMER in the last 168 hours.   Radiology/Studies:  No results found.   Assessment and Plan:   STEMI.  Acute onset with likely LAD occlusion.  Taken to heart catheterization lab for emergent intervention.  Given aspirn 324 mg on route with EMS. Loaded with 180 mg with anticipation of PCI, however the anatomy would have been high risk given large ramus intermedius that would have been at risk with ostial LAD PCI. He also has 90% Lcx ostial lesion. We will call CTS to evaluate for CABG given challenge anatomy, despite P2Y12 load in lab. Balloon angioplasty was performed to regain TIMI 3 flow and IABP to be placed. Recs: -Continue aspirin 81 mg.  Atorvastatin 80 mg (goal LDL < 70 for secondary prev).   -Echo ordered for LV assessment post MI. - Start metoprolol 12.5 mg twice daily - Ordered for cardiac rehab. - CBC, CMP, lipid  panel, A1c, TSH - hold home losartan post cath   2. Tremor. Continue home primidone    Risk Assessment/Risk Scores:    TIMI Risk Score for ST  Elevation MI:   The patient's TIMI risk score is 4, which indicates a 7.3% risk of all cause mortality at 30 days.        Severity of Illness: The appropriate patient status for this patient is INPATIENT. Inpatient status is judged to be reasonable and necessary in order to provide the required intensity of service to ensure the patient's safety. The patient's presenting symptoms, physical exam findings, and initial radiographic and laboratory data in the context  of their chronic comorbidities is felt to place them at high risk for further clinical deterioration. Furthermore, it is not anticipated that the patient will be medically stable for discharge from the hospital within 2 midnights of admission. The following factors support the patient status of inpatient.   " The patient's presenting symptoms include chest pain. " The worrisome physical exam findings include chest pain. " The initial radiographic and laboratory data are worrisome because of STEMI. " The chronic co-morbidities include HTN.   * I certify that at the point of admission it is my clinical judgment that the patient will require inpatient hospital care spanning beyond 2 midnights from the point of admission due to high intensity of service, high risk for further deterioration and high frequency of surveillance required.*   For questions or updates, please contact [facility] Please consult [email or web address] for contact info under     Signed, Joellen Jerseyennis Narcisse Jr, MD  01/16/2021 5:12 AM     Patient seen and examined. Agree with assessment and plan.  Mr. Spero GeraldsFriddle is a 75 year old gentleman who has a history of hypertension and hyperlipidemia.  He was awakened from sleep around 3 AM this morning with new onset substernal and left-sided chest pain with left arm radiation associated  with diaphoresis.  Previously he denied any exertional chest pain symptomatology.  EMS was called.  A code STEMI was activated with significant anterior and inferior ST elevation consistent with probable LAD infarction.  Upon arrival to the hospital he was still having chest pain.  He received aspirin and came straight up to the catheterization laboratory.  On exam he did not have neck vein distention.  Lungs were clear.  Rhythm was regular abdomen was soft and nontender.  Pulses were 2+.  Neurologically was grossly nonfocal.  I discussed EKG findings suggestive of probable occlusion of his LAD.  Emergent catheterization was performed.  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  There is mild irregularity of the proximal RCA.  He received a 180 mg dose of Brilinta at the start of the intervention.  Reperfusion was established with and his LAD was a large LAD vessel which wraps around the apex to supply portion of the distal inferior wall.  He developed idioventricular rhythm ultimately stabilized.  Blood pressure decreased to 78 -80 systolic and  he was started on Levophed.  Balloon dilatations were made with 2.0x12, 2.5x12 and 2.75 x 15 mm.  At this point once flow was established I made the decision that stenting would not be optimal due to high likelihood of jailing a large ramus admitted vessel and particularly with his ostial circumflex stenosis.  Dr. Cliffton AstersLightfoot was contacted by telephone.  I told him I was about to place an intra-aortic balloon pump mixed ability but felt stenting was not the best option and that ultimately he should undergo CABG revascularization surgery once able following his MI.  I then inserted intra-aortic balloon pump which provided excellent diastolic augmentation to approximately 150 systolically.  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  He was transferred from the Cath Lab to to heart with stable hemodynamics and was chest pain-free.  The patient is single.  I contacted his daughter Liborio NixonJanice [phone]) who was unaware of her father's presentation to the hospital.  I had a lengthy discussion with her and discussed his presentation, catheterization findings, and need  for ultimate CABG revascularization surgery once stable following his infarction.  She was at home and has COVID.  The patient left the catheterization laboratory, his Levophed dose was reduced to 5.  Plan to adjust dose accordingly depending upon blood pressure response.  We will obtain 2D echo Doppler study today.  There is very large risks, difficult to secure his radial site with an initial TR band this resulted in formation of slight hematoma.  A second TR band was applied.  Will plan to reinitiate heparin 4 hours after the sheath was removed and continue heparinization until CABG revascularization surgery is performed this week.  No additional antiplatelet therapy will be administered other than aspirin awaiting surgery.  Will initiate high potency statin therapy with atorvastatin 80 mg.   CC time : 45 minutes  Lennette Bihari, MD, [facility] 01/16/2021 7:20 AM

## 2021-01-16 NOTE — Progress Notes (Signed)
ANTICOAGULATION CONSULT NOTE  Pharmacy Consult for heparin Indication:  IABP  No Known Allergies  Patient Measurements:   Heparin Dosing Weight: 100kg  Vital Signs: Temp: 98.5 F (36.9 C) (09/05 0745) Temp Source: Oral (09/05 0745) BP: 113/73 (09/05 0615) Pulse Rate: 89 (09/05 0756)  Labs: Recent Labs    01/16/21 0512  HGB 13.9  HCT 41.0  CREATININE 1.00    CrCl cannot be calculated (Unknown ideal weight.).   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 Pharmacy asked to start IV heparin 4h after radial sheath removal due to hematoma present. No AC PTA.  Goal of Therapy:  Heparin level 0.2-0.5 units/ml Monitor platelets by anticoagulation protocol: Yes   Plan:  Heparin 1000 units/h no bolus at 1030 Check heparin level in 8h   Fredonia Highland, PharmD, [place], [facility] Clinical Pharmacist [phone] Please check AMION for all [facility] Pharmacy numbers 01/16/2021

## 2021-01-16 NOTE — Consult Note (Signed)
Responded to page, pt unavailable, no family present, staff will page again if further chaplain services needed.  Rev. Dr. Donnel Saxon Chaplain

## 2021-01-16 NOTE — Progress Notes (Signed)
[address]       [place] [address]             [phone]        Timothy Munoz [facility] Medical Record #[id number] Date of Birth: 04-04-1946  Referring: No ref. provider found Primary Care: [name], MD Primary Cardiologist:None  Chief Complaint:   No chief complaint on file.   History of Present Illness:     75 year old male admitted following a STEMI.  The patient states that he was asleep and suddenly awoke with left arm pain and crushing chest pain.  He then presented to the emergency department was ruled in for STEMI.  He was taken to the Cath Lab where he underwent PCI with angioplasty to the LAD.  Flow was able to be restored.  The patient was also noted to have significant stenosis to the ostial circumflex.  The patient was also loaded with Brilinta.  An intra-aortic balloon pump was then placed and the patient was then transferred to the ICU.   Past Medical and Surgical History: Previous Chest Surgery: No Previous Chest Radiation: No Diabetes Mellitus: No.  HbA1C 6 Creatinine: 1.19  Past Medical History:  Diagnosis Date   Hyperlipidemia    mild    Hypertension    Knee pain    Obesity    Tremor    Tremor, essential 03/04/2019    Past Surgical History:  Procedure Laterality Date   APPENDECTOMY     BACK SURGERY     HEMORRHOID SURGERY        Social History   Tobacco Use  Smoking Status Former  Smokeless Tobacco Current   Types: Chew    Social History   Substance and Sexual Activity  Alcohol Use Yes   Comment: one beer every two months     No Known Allergies    Current Facility-Administered Medications  Medication Dose Route Frequency Provider Last Rate Last Admin   0.9 %  sodium chloride infusion  250 mL Intravenous PRN Lennette Bihari, MD       acetaminophen (TYLENOL) tablet 650 mg  650 mg Oral Q4H PRN Lennette Bihari, MD       [START ON 01/17/2021] aspirin EC tablet 81 mg  81 mg Oral Daily Joellen Jersey., MD       atorvastatin (LIPITOR) tablet 80 mg  80 mg Oral Daily Lennette Bihari, MD   80 mg at 01/16/21 1009   diazepam (VALIUM) tablet 5 mg  5 mg Oral Q4H PRN Lennette Bihari, MD       heparin ADULT infusion 100 units/mL (25000 units/256mL)  1,000 Units/hr Intravenous Continuous Mosetta Anis, RPH 10 mL/hr at 01/16/21 1200 1,000 Units/hr at 01/16/21 1200   hydrALAZINE (APRESOLINE) injection 10 mg  10 mg Intravenous Q20 Min PRN Lennette Bihari, MD       labetalol (NORMODYNE) injection 10 mg  10 mg Intravenous Q10 min PRN Lennette Bihari, MD       metoprolol tartrate (LOPRESSOR) tablet 12.5 mg  12.5 mg Oral BID Joellen Jersey., MD   12.5 mg at 01/16/21 1009   nitroGLYCERIN (NITROSTAT) SL tablet 0.4 mg  0.4 mg Sublingual Q5 Min x 3 PRN Joellen Jersey., MD       ondansetron [facility]) injection 4 mg  4 mg Intravenous Q6H PRN Lennette Bihari, MD       primidone (MYSOLINE) tablet 50 mg  50  mg Oral QHS Joellen Jersey., MD       sodium chloride flush (NS) 0.9 % injection 3 mL  3 mL Intravenous Q12H Lennette Bihari, MD       sodium chloride flush (NS) 0.9 % injection 3 mL  3 mL Intravenous PRN Lennette Bihari, MD        Medications Prior to Admission  Medication Sig Dispense Refill Last Dose   atorvastatin (LIPITOR) 20 MG tablet Take 20 mg by mouth daily.   01/15/2021   losartan (COZAAR) 25 MG tablet Take 25 mg by mouth daily.   01/15/2021   Omega-3 Fatty Acids (FISH OIL) 1000 MG CAPS Take 2,000 mg by mouth daily.   01/15/2021   primidone (MYSOLINE) 50 MG tablet Take 1/2 tablet at bedtime for 1 week, then increase to 1 tablet at bedtime (Patient taking differently: Take 50 mg by mouth daily.) 30 tablet 5 01/15/2021   losartan (COZAAR) 50 MG tablet Take 50 mg by mouth daily. (Patient not taking: Reported on 01/16/2021)   Not Taking    Family History  Problem Relation Age of Onset   Hypertension Mother    Parkinson's disease Mother    Alzheimer's disease Mother    Hypertension  Father    Suicidality Father      Review of Systems:   Review of Systems  Constitutional:  Positive for malaise/fatigue.  Respiratory:  Positive for shortness of breath.   Cardiovascular:  Positive for chest pain. Negative for leg swelling.     Physical Exam: BP (!) 154/94 (BP Location: Left Wrist)   Pulse 85   Temp 98.3 F (36.8 C) (Oral)   Resp (!) 25   Ht 6\' 3"  (1.905 m)   Wt 117.9 kg   SpO2 96%   BMI 32.50 kg/m  Physical Exam Constitutional:      General: He is not in acute distress.    Appearance: He is normal weight.  HENT:     Head: Normocephalic and atraumatic.  Eyes:     Extraocular Movements: Extraocular movements intact.  Cardiovascular:     Rate and Rhythm: Normal rate.     Heart sounds: No murmur heard.    Comments: IABP in place at one-to-one with good augmentation. Pulmonary:     Effort: No respiratory distress.  Abdominal:     General: Abdomen is flat. There is no distension.     Palpations: Abdomen is soft.  Musculoskeletal:        General: Normal range of motion.     Cervical back: Normal range of motion.  Skin:    General: Skin is warm and dry.  Neurological:     General: No focal deficit present.     Mental Status: He is alert and oriented to person, place, and time.      Diagnostic Studies & Laboratory data:    Left Heart Catherization: Large ramus and circumflex.  There is an ostial lesion in the circumflex.  The LAD appears to be a good target.  There is no significant disease in the RCA. Echo: Reduced LV function.  Myocardium is thickened. RV appears slightly sluggish as well.   No significant valvular disease.   I have independently reviewed the above radiologic studies and discussed with the patient   Recent Lab Findings: Lab Results  Component Value Date   HGB 13.9 01/16/2021   HCT 41.0 01/16/2021   GLUCOSE 150 (H) 01/16/2021   CHOL 163 01/16/2021   TRIG 33 01/16/2021  HDL 42 01/16/2021   LDLCALC 114 (H) 01/16/2021    ALT 86 (H) 01/16/2021   AST 570 (H) 01/16/2021   NA 137 01/16/2021   K 4.1 01/16/2021   CL 109 01/16/2021   CREATININE 1.19 01/16/2021   BUN 23 01/16/2021   CO2 21 (L) 01/16/2021   TSH 1.959 01/16/2021   INR 1.2 01/16/2021   HGBA1C 6.0 (H) 01/16/2021      Assessment / Plan:   75 year old male status post STEMI with angioplasty to the LAD with restoration of flow.  Intra-aortic balloon pump was placed.  The patient is currently pain-free.  He was loaded with Brilinta.  We will plan for surgical revascularization as soon as possible.  Would prefer for the Brilinta washout and for him to recover from his STEMI prior to surgery.     I  spent 40 minutes counseling the patient face to face.   Corliss Skains 01/16/2021 12:40 PM

## 2021-01-17 ENCOUNTER — Inpatient Hospital Stay (HOSPITAL_COMMUNITY): Payer: Medicare HMO

## 2021-01-17 ENCOUNTER — Other Ambulatory Visit (HOSPITAL_COMMUNITY): Payer: Medicare HMO

## 2021-01-17 ENCOUNTER — Encounter (HOSPITAL_COMMUNITY): Payer: Self-pay | Admitting: Cardiovascular Disease

## 2021-01-17 DIAGNOSIS — I10 Essential (primary) hypertension: Secondary | ICD-10-CM

## 2021-01-17 DIAGNOSIS — I517 Cardiomegaly: Secondary | ICD-10-CM | POA: Diagnosis not present

## 2021-01-17 DIAGNOSIS — I2102 ST elevation (STEMI) myocardial infarction involving left anterior descending coronary artery: Secondary | ICD-10-CM | POA: Diagnosis not present

## 2021-01-17 DIAGNOSIS — E7800 Pure hypercholesterolemia, unspecified: Secondary | ICD-10-CM

## 2021-01-17 DIAGNOSIS — Z4509 Encounter for adjustment and management of other cardiac device: Secondary | ICD-10-CM | POA: Diagnosis not present

## 2021-01-17 DIAGNOSIS — E785 Hyperlipidemia, unspecified: Secondary | ICD-10-CM | POA: Diagnosis not present

## 2021-01-17 DIAGNOSIS — I5021 Acute systolic (congestive) heart failure: Secondary | ICD-10-CM | POA: Diagnosis not present

## 2021-01-17 DIAGNOSIS — N179 Acute kidney failure, unspecified: Secondary | ICD-10-CM | POA: Diagnosis not present

## 2021-01-17 LAB — HEPARIN LEVEL (UNFRACTIONATED)
Heparin Unfractionated: 0.14 IU/mL — ABNORMAL LOW (ref 0.30–0.70)
Heparin Unfractionated: 0.11 IU/mL — ABNORMAL LOW (ref 0.30–0.70)
Heparin Unfractionated: 0.16 IU/mL — ABNORMAL LOW (ref 0.30–0.70)

## 2021-01-17 LAB — CBC
HCT: 42.5 % (ref 39.0–52.0)
Hemoglobin: 14.1 g/dL (ref 13.0–17.0)
MCH: 29.3 pg (ref 26.0–34.0)
MCHC: 33.2 g/dL (ref 30.0–36.0)
MCV: 88.4 fL (ref 80.0–100.0)
Platelets: 205 10*3/uL (ref 150–400)
RBC: 4.81 MIL/uL (ref 4.22–5.81)
RDW: 14.1 % (ref 11.5–15.5)
WBC: 12.8 10*3/uL — ABNORMAL HIGH (ref 4.0–10.5)
nRBC: 0 % (ref 0.0–0.2)

## 2021-01-17 LAB — BASIC METABOLIC PANEL
Anion gap: 6 (ref 5–15)
BUN: 22 mg/dL (ref 8–23)
CO2: 22 mmol/L (ref 22–32)
Calcium: 8.8 mg/dL — ABNORMAL LOW (ref 8.9–10.3)
Chloride: 108 mmol/L (ref 98–111)
Creatinine, Ser: 1.15 mg/dL (ref 0.61–1.24)
GFR, Estimated: >60 mL/min (ref >60)
Glucose, Bld: 151 mg/dL — ABNORMAL HIGH (ref 70–99)
Potassium: 4 mmol/L (ref 3.5–5.1)
Sodium: 136 mmol/L (ref 135–145)

## 2021-01-17 LAB — GLUCOSE, CAPILLARY
Glucose-Capillary: 218 mg/dL — ABNORMAL HIGH (ref 70–99)
Glucose-Capillary: 161 mg/dL — ABNORMAL HIGH (ref 70–99)
Glucose-Capillary: 117 mg/dL — ABNORMAL HIGH (ref 70–99)
Glucose-Capillary: 112 mg/dL — ABNORMAL HIGH (ref 70–99)
Glucose-Capillary: 118 mg/dL — ABNORMAL HIGH (ref 70–99)

## 2021-01-17 MED ORDER — FUROSEMIDE 10 MG/ML IJ SOLN
20.0000 mg | Freq: Once | INTRAMUSCULAR | Status: AC
  Administered 2021-01-17: 20 mg via INTRAVENOUS
  Filled 2021-01-17: qty 2

## 2021-01-17 MED ORDER — INSULIN ASPART 100 UNIT/ML IJ SOLN
0.0000 [IU] | Freq: Three times a day (TID) | INTRAMUSCULAR | Status: DC
Start: 2021-01-17 — End: 2021-01-20
  Administered 2021-01-17: 3 [IU] via SUBCUTANEOUS
  Administered 2021-01-18 – 2021-01-19 (×2): 2 [IU] via SUBCUTANEOUS

## 2021-01-17 MED ORDER — LOSARTAN POTASSIUM 25 MG PO TABS
25.0000 mg | ORAL_TABLET | Freq: Every day | ORAL | Status: DC
  Administered 2021-01-17 – 2021-01-18 (×2): 25 mg via ORAL
  Filled 2021-01-17 (×2): qty 1

## 2021-01-17 NOTE — Progress Notes (Signed)
ANTICOAGULATION CONSULT NOTE  Pharmacy Consult for heparin Indication:  IABP  No Known Allergies  Patient Measurements: Height: 6\' 3"  (190.5 cm) Weight: 117.1 kg (258 lb 2.5 oz) IBW/kg (Calculated) : 84.5 Heparin Dosing Weight: 100kg  Vital Signs: Temp: 98.1 F (36.7 C) (09/06 1936) Temp Source: Axillary (09/06 1936) BP: 138/105 (09/06 2100) Pulse Rate: 73 (09/06 2100)  Labs: Recent Labs    01/16/21 0512 01/16/21 0739 01/16/21 0955 01/16/21 1749 01/17/21 0045 01/17/21 1029 01/17/21 2109  HGB 13.9  --   --   --  14.1  --   --   HCT 41.0  --   --   --  42.5  --   --   PLT  --   --   --   --  205  --   --   APTT  --  >200*  --   --   --   --   --   LABPROT  --  14.8  --   --   --   --   --   INR  --  1.2  --   --   --   --   --   HEPARINUNFRC  --   --   --    < > 0.14* 0.11* 0.16*  CREATININE 1.00 1.19  --   --  1.15  --   --   TROPONINIHS  --  >24,000* >24,000*  --   --   --   --    < > = values in this interval not displayed.     Estimated Creatinine Clearance: 77.7 mL/min (by C-G formula based on SCr of 1.15 mg/dL).   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  Pharmacy asked to start IV heparin 4h after radial sheath removal due to hematoma present. No AC PTA.  Heparin level subtherapeutic (0.16) on gtt at 1600 units/hr. No issue with infusion noted. Will increase heparin drip rate and check heperin level with am labs.  Goal of Therapy:  Heparin level 0.2-0.5 units/ml Monitor platelets by anticoagulation protocol: Yes   Plan:  Increase Heparin to 1900 units/hr  F/u heparin level with am labs  Thank you for allowing pharmacy to participate in this patient's care.  67, PharmD,  [facility] Clinical Pharmacist Please see AMION for all Pharmacists' Contact Phone Numbers 01/17/2021, 10:05 PM

## 2021-01-17 NOTE — Progress Notes (Signed)
Progress Note  Patient Name: Timothy Munoz Date of Encounter: 01/17/2021  [facility] Cardiologist: None New- Nicki Guadalajarahomas Kelly MD  Subjective   Feels well this am. No chest pain or dyspnea. Comfortable.   Inpatient Medications    Scheduled Meds:  aspirin EC  81 mg Oral Daily   atorvastatin  80 mg Oral Daily   Chlorhexidine Gluconate Cloth  6 each Topical Daily   insulin aspart  0-15 Units Subcutaneous TID WC   metoprolol tartrate  12.5 mg Oral BID   primidone  50 mg Oral QHS   sodium chloride flush  3 mL Intravenous Q12H   Continuous Infusions:  sodium chloride     heparin 1,300 Units/hr (01/17/21 0700)   PRN Meds: sodium chloride, acetaminophen, diazepam, nitroGLYCERIN, ondansetron (ZOFRAN) IV, sodium chloride flush   Vital Signs    Vitals:   01/17/21 0400 01/17/21 0500 01/17/21 0600 01/17/21 0700  BP: (!) 141/61 (!) 145/78 (!) 148/72 (!) 148/84  Pulse: (!) 57 (!) 57 62 71  Resp: 15 14 (!) 22 20  Temp: 97.8 F (36.6 C)     TempSrc: Oral     SpO2: 98% 98% 95% 96%  Weight:  117.1 kg    Height:        Intake/Output Summary (Last 24 hours) at 01/17/2021 0755 Last data filed at 01/17/2021 0700 Gross per 24 hour  Intake 854.29 ml  Output 1225 ml  Net -370.71 ml   Last 3 Weights 01/17/2021 01/16/2021 08/29/2020  Weight (lbs) 258 lb 2.5 oz 260 lb 268 lb  Weight (kg) 117.1 kg 117.935 kg 121.564 kg      Telemetry    NSR - Personally Reviewed  ECG    NSR rate 73. Anterolateral STEMI with persistent ST elevation across precordium and Q waves - Personally Reviewed  Physical Exam   GEN: No acute distress.   Neck: No JVD Cardiac: RRR, no murmurs, rubs, or gallops.  Respiratory: Clear to auscultation bilaterally. GI: Soft, nontender, non-distended  MS: No edema; No deformity. iABP in place right  groin. No hematoma. Distal pulse good. Radial site without hematoma.  Neuro:  Nonfocal  Psych: Normal affect   Labs    High Sensitivity Troponin:   Recent Labs  Lab  01/16/21 0739 01/16/21 0955  TROPONINIHS >24,000* >24,000*      Chemistry Recent Labs  Lab 01/16/21 0512 01/16/21 0739 01/17/21 0045  NA 141 137 136  K 4.0 4.1 4.0  CL 108 109 108  CO2  --  21* 22  GLUCOSE 146* 150* 151*  BUN 24* 23 22  CREATININE 1.00 1.19 1.15  CALCIUM  --  8.8* 8.8*  PROT  --  6.4*  --   ALBUMIN  --  3.7  --   AST  --  570*  --   ALT  --  86*  --   ALKPHOS  --  133*  --   BILITOT  --  0.5  --   GFRNONAA  --  >60 >60  ANIONGAP  --  7 6     Hematology Recent Labs  Lab 01/16/21 0512 01/17/21 0045  WBC  --  12.8*  RBC  --  4.81  HGB 13.9 14.1  HCT 41.0 42.5  MCV  --  88.4  MCH  --  29.3  MCHC  --  33.2  RDW  --  14.1  PLT  --  205    BNP Recent Labs  Lab 01/16/21 0739  BNP 44.2  DDimer No results for input(s): DDIMER in the last 168 hours.   Radiology    CARDIAC CATHETERIZATION  Result Date: 01/16/2021   Mid LM to Dist LM lesion is 20% stenosed.   Ost LAD to Prox LAD lesion is 100% stenosed.   Ost Cx lesion is 90% stenosed.   Ost RCA lesion is 20% stenosed.   Post intervention, there is a 60% residual stenosis.   There is moderate left ventricular systolic dysfunction.   LV end diastolic pressure is moderately elevated.   The left ventricular ejection fraction is 35-45% by visual estimate. Acute anterolateral myocardial infarction secondary to total near ostial occlusion of a large LAD which wraps around the LV apex. Large normal ramus intermediate vessel. Left circumflex vessel with 85 to 90% ostial stenosis. Large RCA with 20% irregularity proximally. PTCA of the LAD ostium with hypotension and idioventricular rhythm with reperfusion requiring initiation of Levophed reestablishment of TIMI-3 flow to a large LAD vessel which wraps around the LV apex. Insertion of an intra-aortic balloon pump for optimal hemodynamic support with augmented diastolic pressure at 150 millimeters mercury. RECOMMENDATION: In light of the patient's complex  anatomy, stenting was not performed due to high likelihood of jailing a very large ramus intermediate vessel and with concomitant ostial circumflex stenosis it was felt that surgical revascularization once patient stabilizes from his myocardial infarction would be optimal therapy.  He did receive 1 dose of oral Brilinta in the laboratory which will not be continued.  We will plan 2D echo Doppler today.  Initiate aggressive lipid-lowering therapy.  Dr. Cliffton Asters was contacted who will see the patient and depending upon the patient;s hemodynamic stability perform CABG revascularization surgery this week following washout of his initial Brilinta dose.   ECHOCARDIOGRAM COMPLETE  Result Date: 01/16/2021    ECHOCARDIOGRAM REPORT   Patient Name:   Timothy Munoz Date of Exam: 01/16/2021 Medical Rec #:  [id number]       Height:       75.0 in Accession #:    [id number]      Weight:       260.0 lb Date of Birth:  07/21/1945       BSA:          2.453 m Patient Age:    75 years        BP:           146/84 mmHg Patient Gender: M               HR:           72 bpm. Exam Location:  Inpatient Procedure: 2D Echo, Color Doppler and Cardiac Doppler Indications:    STEMI  History:        Patient has no prior history of Echocardiogram examinations.                 Acute MI.  Sonographer:    Delcie Roch RDCS Referring Phys: [id number] DENNIS NARCISSE JR IMPRESSIONS  1. Left ventricular ejection fraction, by estimation, is 35%. The left ventricle has moderately decreased function. The left ventricle demonstrates regional wall motion abnormalities with basal to mid anteroseptal, inferoseptal, and anterior akinesis, apical anterior akinesis, apical inferior akinesis. There is mild left ventricular hypertrophy. Left ventricular diastolic parameters are consistent with Grade I diastolic dysfunction (impaired relaxation).  2. Hypokinesis of the apical RV. Right ventricular systolic function is mildly reduced. The right ventricular size is  normal. Tricuspid regurgitation signal is inadequate for assessing PA pressure.  3. The mitral valve is normal in structure. Trivial mitral valve regurgitation. No evidence of mitral stenosis.  4. The aortic valve is tricuspid. Aortic valve regurgitation is not visualized. No aortic stenosis is present.  5. Aortic dilatation noted. There is mild dilatation of the ascending aorta, measuring 42 mm.  6. The inferior vena cava is normal in size with greater than 50% respiratory variability, suggesting right atrial pressure of 3 mmHg. FINDINGS  Left Ventricle: Left ventricular ejection fraction, by estimation, is 35%. The left ventricle has moderately decreased function. The left ventricle demonstrates regional wall motion abnormalities. The left ventricular internal cavity size was normal in size. There is mild left ventricular hypertrophy. Left ventricular diastolic parameters are consistent with Grade I diastolic dysfunction (impaired relaxation). Right Ventricle: Hypokinesis of the apical RV. The right ventricular size is normal. No increase in right ventricular wall thickness. Right ventricular systolic function is mildly reduced. Tricuspid regurgitation signal is inadequate for assessing PA pressure. Left Atrium: Left atrial size was normal in size. Right Atrium: Right atrial size was normal in size. Pericardium: There is no evidence of pericardial effusion. Mitral Valve: The mitral valve is normal in structure. Trivial mitral valve regurgitation. No evidence of mitral valve stenosis. Tricuspid Valve: The tricuspid valve is normal in structure. Tricuspid valve regurgitation is not demonstrated. Aortic Valve: The aortic valve is tricuspid. Aortic valve regurgitation is not visualized. No aortic stenosis is present. Pulmonic Valve: The pulmonic valve was normal in structure. Pulmonic valve regurgitation is trivial. Aorta: Aortic dilatation noted. There is mild dilatation of the ascending aorta, measuring 42 mm.  Venous: The inferior vena cava is normal in size with greater than 50% respiratory variability, suggesting right atrial pressure of 3 mmHg. IAS/Shunts: No atrial level shunt detected by color flow Doppler.  LEFT VENTRICLE PLAX 2D LVIDd:         4.30 cm     Diastology LVIDs:         3.10 cm     LV e' medial:    7.07 cm/s LV PW:         1.10 cm     LV E/e' medial:  8.7 LV IVS:        1.20 cm     LV e' lateral:   7.29 cm/s LVOT diam:     1.90 cm     LV E/e' lateral: 8.4 LV SV:         48 LV SV Index:   20 LVOT Area:     2.84 cm  LV Volumes (MOD) LV vol d, MOD A2C: 80.3 ml LV vol s, MOD A2C: 45.5 ml LV SV MOD A2C:     34.8 ml RIGHT VENTRICLE             IVC RV S prime:     17.70 cm/s  IVC diam: 1.90 cm TAPSE (M-mode): 2.5 cm LEFT ATRIUM           Index       RIGHT ATRIUM           Index LA diam:      3.30 cm 1.35 cm/m  RA Area:     14.30 cm LA Vol (A4C): 40.1 ml 16.35 ml/m RA Volume:   32.30 ml  13.17 ml/m  AORTIC VALVE LVOT Vmax:   110.00 cm/s LVOT Vmean:  68.600 cm/s LVOT VTI:    0.170 m  AORTA Ao Root diam: 3.90 cm Ao Asc diam:  4.20 cm MITRAL VALVE MV  Area (PHT): 3.30 cm    SHUNTS MV Decel Time: 230 msec    Systemic VTI:  0.17 m MV E velocity: 61.20 cm/s  Systemic Diam: 1.90 cm MV A velocity: 94.30 cm/s MV E/A ratio:  0.65 Dalton McleanMD Electronically signed by Wilfred Lacy Signature Date/Time: 01/16/2021/11:11:57 AM    Final     Cardiac Studies   Cardiac cath:  Coronary/Graft Acute MI Revascularization  IABP Insertion  LEFT HEART CATH AND CORONARY ANGIOGRAPHY   Conclusion      Mid LM to Dist LM lesion is 20% stenosed.   Ost LAD to Prox LAD lesion is 100% stenosed.   Ost Cx lesion is 90% stenosed.   Ost RCA lesion is 20% stenosed.   Post intervention, there is a 60% residual stenosis.   There is moderate left ventricular systolic dysfunction.   LV end diastolic pressure is moderately elevated.   The left ventricular ejection fraction is 35-45% by visual estimate.   Acute anterolateral  myocardial infarction secondary to total near ostial occlusion of a large LAD which wraps around the LV apex.   Large normal ramus intermediate vessel.   Left circumflex vessel with 85 to 90% ostial stenosis.   Large RCA with 20% irregularity proximally.   PTCA of the LAD ostium with hypotension and idioventricular rhythm with reperfusion requiring initiation of Levophed reestablishment of TIMI-3 flow to a large LAD vessel which wraps around the LV apex.   Insertion of an intra-aortic balloon pump for optimal hemodynamic support with augmented diastolic pressure at 150 millimeters mercury.   RECOMMENDATION: In light of the patient's complex anatomy, stenting was not performed due to high likelihood of jailing a very large ramus intermediate vessel and with concomitant ostial circumflex stenosis it was felt that surgical revascularization once patient stabilizes from his myocardial infarction would be optimal therapy.  He did receive 1 dose of oral Brilinta in the laboratory which will not be continued.  We will plan 2D echo Doppler today.  Initiate aggressive lipid-lowering therapy.  Dr. Cliffton Asters was contacted who will see the patient and depending upon the patient;s hemodynamic stability perform CABG revascularization surgery this week following washout of his initial Brilinta dose.    Coronary Diagrams  Diagnostic Dominance: Right Intervention  Implants     Echo: IMPRESSIONS     1. Left ventricular ejection fraction, by estimation, is 35%. The left  ventricle has moderately decreased function. The left ventricle  demonstrates regional wall motion abnormalities with basal to mid  anteroseptal, inferoseptal, and anterior akinesis,  apical anterior akinesis, apical inferior akinesis. There is mild left  ventricular hypertrophy. Left ventricular diastolic parameters are  consistent with Grade I diastolic dysfunction (impaired relaxation).   2. Hypokinesis of the apical RV. Right  ventricular systolic function is  mildly reduced. The right ventricular size is normal. Tricuspid  regurgitation signal is inadequate for assessing PA pressure.   3. The mitral valve is normal in structure. Trivial mitral valve  regurgitation. No evidence of mitral stenosis.   4. The aortic valve is tricuspid. Aortic valve regurgitation is not  visualized. No aortic stenosis is present.   5. Aortic dilatation noted. There is mild dilatation of the ascending  aorta, measuring 42 mm.   6. The inferior vena cava is normal in size with greater than 50%  respiratory variability, suggesting right atrial pressure of 3 mmHg.  Patient Profile     75 y.o. male with history of HTN, HLD presents with acute anterior STEMI  Assessment &  Plan    Acute anterior STEMI. Patient presented 2 hours into onset of pain. Ecg showed extensive ST elevation in anterolateral leads with Q waves. Troponin > 24K. Underwent emergent cardiac cath with POBA of ostial LAD stenosis with restoration of flow but suboptimal result. Also has severe ostial LCx stenosis. EDP 27 mm Hg. IABP placed. Given anatomy it was felt that CABG would ultimately be best option for revascularization. He was given single loading dose of Brilinta. Plan surgery after wash out. Timing of surgery unclear at this point. EF 35% by Echo. CXR clear today. On ASA, metoprolol, IV heparin. Would favor leaving IABP in today and keep uninterrupted IV heparin for 48 hours. If surgery is going to be several days out I would probably move to get IABP out tomorrow. Will start  low dose losartan 25 mg daily. Anticipate transition to [facility] +/- aldactone post op. Will give IV lasix x 1.  Acute systolic CHF. EF 35%. Will give IV lasix x 1. Continue current metoprolol dose. Start low dose losartan. IABP support today.  HTN controlled. HLD on high dose statin.  Pre diabetes. On SSI.  Tremor on primadone.   For questions or updates, please contact [facility] Please consult [email or web address] for contact info under        Signed, Emanuela Runnion [place], MD  01/17/2021, 7:55 AM

## 2021-01-17 NOTE — Progress Notes (Addendum)
ANTICOAGULATION CONSULT NOTE  Pharmacy Consult for heparin Indication:  IABP  No Known Allergies  Patient Measurements: Height: 6\' 3"  (190.5 cm) Weight: 117.1 kg (258 lb 2.5 oz) IBW/kg (Calculated) : 84.5 Heparin Dosing Weight: 100kg  Vital Signs: Temp: 98.6 F (37 C) (09/06 1138) Temp Source: Oral (09/06 1138) BP: 147/88 (09/06 1200) Pulse Rate: 36 (09/06 1200)  Labs: Recent Labs    01/16/21 0512 01/16/21 0739 01/16/21 0955 01/16/21 1749 01/17/21 0045 01/17/21 1029  HGB 13.9  --   --   --  14.1  --   HCT 41.0  --   --   --  42.5  --   PLT  --   --   --   --  205  --   APTT  --  >200*  --   --   --   --   LABPROT  --  14.8  --   --   --   --   INR  --  1.2  --   --   --   --   HEPARINUNFRC  --   --   --  <0.10* 0.14* 0.11*  CREATININE 1.00 1.19  --   --  1.15  --   TROPONINIHS  --  >24,000* >24,000*  --   --   --      Estimated Creatinine Clearance: 77.7 mL/min (by C-G formula based on SCr of 1.15 mg/dL).   Medical History: Past Medical History:  Diagnosis Date   Hyperlipidemia    mild    Hypertension    Knee pain    Obesity    Tremor    Tremor, essential 03/04/2019    Assessment: 39 yoM admitted as code STEMI s/pPOBA to LAD and  IABP placement > plan for CABG after ticagrelor washout post load.  Pharmacy asked to start IV heparin 4h after radial sheath removal due to hematoma present. No AC PTA.  Heparin level subtherapeutic (0.11) on gtt at 1300 units/hr. RN reports no issue with infusion. Pt with some radial site bleeding right after heparin gtt increased but RN applied pressure and no issues since. No further femoral/IABP site bleeding after Dr. 66 injected with epi 9/5 p.m. CBC stable. Will increase heparin drip rate and check heperin level in 6-8 hours.  Goal of Therapy:  Heparin level 0.2-0.5 units/ml Monitor platelets by anticoagulation protocol: Yes   Plan:  Increase Heparin to 1600 units/hr  F/u 8 hr heparin level  Thank you for  allowing pharmacy to participate in this patient's care.  Elease Hashimoto, PharmD PGY1 Pharmacy Resident 01/17/2021 12:56 PM Check [email or web address] for unit specific pharmacy number

## 2021-01-17 NOTE — Progress Notes (Signed)
ANTICOAGULATION CONSULT NOTE  Pharmacy Consult for heparin Indication:  IABP  No Known Allergies  Patient Measurements: Height: 6\' 3"  (190.5 cm) Weight: 117.9 kg (260 lb) IBW/kg (Calculated) : 84.5 Heparin Dosing Weight: 100kg  Vital Signs: Temp: 98.1 F (36.7 C) (09/05 1900) Temp Source: Oral (09/05 1900) BP: 131/61 (09/06 0200) Pulse Rate: 55 (09/06 0200)  Labs: Recent Labs    01/16/21 0512 01/16/21 0739 01/16/21 0955 01/16/21 1749 01/17/21 0045  HGB 13.9  --   --   --  14.1  HCT 41.0  --   --   --  42.5  PLT  --   --   --   --  205  APTT  --  >200*  --   --   --   LABPROT  --  14.8  --   --   --   INR  --  1.2  --   --   --   HEPARINUNFRC  --   --   --  <0.10* 0.14*  CREATININE 1.00 1.19  --   --  1.15  TROPONINIHS  --  >24,000* >24,000*  --   --      Estimated Creatinine Clearance: 78 mL/min (by C-G formula based on SCr of 1.15 mg/dL).   Medical History: Past Medical History:  Diagnosis Date   Hyperlipidemia    mild    Hypertension    Knee pain    Obesity    Tremor    Tremor, essential 03/04/2019    Assessment: 50 yoM admitted as code STEMI s/pPOBA to LAD and  IABP placement > plan for CABG after ticagrelor washout post load.  Pharmacy asked to start IV heparin 4h after radial sheath removal due to hematoma present. No AC PTA.  Heparin level subtherapeutic (0.14) on gtt at 1200 units/hr. Noted level drawn ~5h post gtt increase. RN reports no issue with infusion. Pt with some radial site bleeding right after heparin gtt increased but RN applied pressure and no issues since. No further femoral/IABP site bleeding after Dr. 66 injected with epi 9/5 p.m. CBC stable.  Goal of Therapy:  Heparin level 0.2-0.5 units/ml Monitor platelets by anticoagulation protocol: Yes   Plan:  Increase Heparin slightly to 1300 units/hr  F/u 8 hr heparin level  Elease Hashimoto, PharmD, BCPS Please see amion for complete clinical pharmacist phone list 01/17/2021 2:34  AM

## 2021-01-18 ENCOUNTER — Inpatient Hospital Stay (HOSPITAL_COMMUNITY): Payer: Medicare HMO

## 2021-01-18 DIAGNOSIS — I25110 Atherosclerotic heart disease of native coronary artery with unstable angina pectoris: Secondary | ICD-10-CM

## 2021-01-18 DIAGNOSIS — Z01810 Encounter for preprocedural cardiovascular examination: Secondary | ICD-10-CM | POA: Diagnosis not present

## 2021-01-18 DIAGNOSIS — E785 Hyperlipidemia, unspecified: Secondary | ICD-10-CM

## 2021-01-18 DIAGNOSIS — I10 Essential (primary) hypertension: Secondary | ICD-10-CM | POA: Diagnosis not present

## 2021-01-18 DIAGNOSIS — N179 Acute kidney failure, unspecified: Secondary | ICD-10-CM | POA: Diagnosis not present

## 2021-01-18 DIAGNOSIS — I214 Non-ST elevation (NSTEMI) myocardial infarction: Secondary | ICD-10-CM

## 2021-01-18 DIAGNOSIS — I5021 Acute systolic (congestive) heart failure: Secondary | ICD-10-CM | POA: Diagnosis not present

## 2021-01-18 DIAGNOSIS — E7800 Pure hypercholesterolemia, unspecified: Secondary | ICD-10-CM | POA: Diagnosis not present

## 2021-01-18 DIAGNOSIS — I2102 ST elevation (STEMI) myocardial infarction involving left anterior descending coronary artery: Secondary | ICD-10-CM | POA: Diagnosis not present

## 2021-01-18 LAB — BASIC METABOLIC PANEL
Anion gap: 7 (ref 5–15)
BUN: 31 mg/dL — ABNORMAL HIGH (ref 8–23)
CO2: 23 mmol/L (ref 22–32)
Calcium: 8.6 mg/dL — ABNORMAL LOW (ref 8.9–10.3)
Chloride: 105 mmol/L (ref 98–111)
Creatinine, Ser: 1.39 mg/dL — ABNORMAL HIGH (ref 0.61–1.24)
GFR, Estimated: 53 mL/min — ABNORMAL LOW (ref >60)
Glucose, Bld: 113 mg/dL — ABNORMAL HIGH (ref 70–99)
Potassium: 4 mmol/L (ref 3.5–5.1)
Sodium: 135 mmol/L (ref 135–145)

## 2021-01-18 LAB — CBC
HCT: 39.3 % (ref 39.0–52.0)
Hemoglobin: 13 g/dL (ref 13.0–17.0)
MCH: 29.5 pg (ref 26.0–34.0)
MCHC: 33.1 g/dL (ref 30.0–36.0)
MCV: 89.1 fL (ref 80.0–100.0)
Platelets: 169 10*3/uL (ref 150–400)
RBC: 4.41 MIL/uL (ref 4.22–5.81)
RDW: 14 % (ref 11.5–15.5)
WBC: 12 10*3/uL — ABNORMAL HIGH (ref 4.0–10.5)
nRBC: 0 % (ref 0.0–0.2)

## 2021-01-18 LAB — GLUCOSE, CAPILLARY
Glucose-Capillary: 121 mg/dL — ABNORMAL HIGH (ref 70–99)
Glucose-Capillary: 103 mg/dL — ABNORMAL HIGH (ref 70–99)
Glucose-Capillary: 110 mg/dL — ABNORMAL HIGH (ref 70–99)

## 2021-01-18 LAB — HEPARIN LEVEL (UNFRACTIONATED): Heparin Unfractionated: 0.35 IU/mL (ref 0.30–0.70)

## 2021-01-18 LAB — SURGICAL PCR SCREEN
MRSA, PCR: NEGATIVE
Staphylococcus aureus: POSITIVE — AB

## 2021-01-18 MED ORDER — LIDOCAINE HCL (PF) 1 % IJ SOLN
INTRAMUSCULAR | Status: AC
  Filled 2021-01-18: qty 5

## 2021-01-18 MED ORDER — MUPIROCIN 2 % EX OINT
1.0000 "application " | TOPICAL_OINTMENT | Freq: Two times a day (BID) | CUTANEOUS | Status: AC
  Administered 2021-01-18 – 2021-01-22 (×9): 1 via NASAL
  Filled 2021-01-18: qty 22

## 2021-01-18 MED ORDER — "THROMBI-PAD 3""X3"" EX PADS"
1.0000 | MEDICATED_PAD | Freq: Once | CUTANEOUS | Status: AC
  Administered 2021-01-18: 1 via TOPICAL
  Filled 2021-01-18: qty 1

## 2021-01-18 NOTE — Progress Notes (Signed)
ANTICOAGULATION CONSULT NOTE  Pharmacy Consult for heparin Indication:  IABP  No Known Allergies  Patient Measurements: Height: 6\' 3"  (190.5 cm) Weight: 117.1 kg (258 lb 2.5 oz) IBW/kg (Calculated) : 84.5 Heparin Dosing Weight: 100kg  Vital Signs: Temp: 98.1 F (36.7 C) (09/06 1936) Temp Source: Axillary (09/06 1936) BP: 118/78 (09/07 0400) Pulse Rate: 29 (09/07 0500)  Labs: Recent Labs    01/16/21 0512 01/16/21 0739 01/16/21 0955 01/16/21 1749 01/17/21 0045 01/17/21 1029 01/17/21 2109 01/18/21 0504  HGB 13.9  --   --   --  14.1  --   --  13.0  HCT 41.0  --   --   --  42.5  --   --  39.3  PLT  --   --   --   --  205  --   --  169  APTT  --  >200*  --   --   --   --   --   --   LABPROT  --  14.8  --   --   --   --   --   --   INR  --  1.2  --   --   --   --   --   --   HEPARINUNFRC  --   --   --    < > 0.14* 0.11* 0.16* 0.35  CREATININE 1.00 1.19  --   --  1.15  --   --   --   TROPONINIHS  --  >24,000* >24,000*  --   --   --   --   --    < > = values in this interval not displayed.     Estimated Creatinine Clearance: 77.7 mL/min (by C-G formula based on SCr of 1.15 mg/dL).   Medical History: Past Medical History:  Diagnosis Date   Hyperlipidemia    mild    Hypertension    Knee pain    Obesity    Tremor    Tremor, essential 03/04/2019    Assessment: 24 yoM admitted as code STEMI s/pPOBA to LAD and  IABP placement > plan for CABG after ticagrelor washout post load.  Pharmacy asked to start IV heparin 4h after radial sheath removal due to hematoma present. No AC PTA.  Heparin level therapeutic (0.35) on gtt at 1900 units/hr. No bleeding noted.  Goal of Therapy:  Heparin level 0.2-0.5 units/ml Monitor platelets by anticoagulation protocol: Yes   Plan:  Continue Heparin infusion at 1900 units/hr  Daily heparin level and CBC  66, PharmD, BCPS Please see amion for complete clinical pharmacist phone list 01/18/2021, 5:47 AM

## 2021-01-18 NOTE — Progress Notes (Signed)
Discussed IS, sternal precautions, mobility and d/c planning with pt. Very receptive. Performed 2000 ml on IS. Gave pt materials to review. Sts he did not have mobility limitations PTA. His daughter lives nearby, encouraged him to discuss 24/7 for a week with her at d/c. Gave pt videos to view. [phone] Ethelda Chick CES, ACSM 2:15 PM 01/18/2021

## 2021-01-18 NOTE — Progress Notes (Signed)
Pre cabg has been completed.   Preliminary results in CV Proc.   Lakesha Levinson Felix Pratt 01/18/2021 1:02 PM

## 2021-01-18 NOTE — Progress Notes (Signed)
S/p STEMI. Has IABP on Heparin drip. Patient for OR on Thursday to undergo a CABG with Dr. Dorris Fetch

## 2021-01-18 NOTE — Progress Notes (Signed)
2 Days Post-Op Procedure(s) (LRB): Coronary/Graft Acute MI Revascularization (N/A) LEFT HEART CATH AND CORONARY ANGIOGRAPHY (N/A) IABP Insertion (N/A) Subjective: No CP or SOB, tired of lying on back  Objective: Vital signs in last 24 hours: Temp:  [97.9 F (36.6 C)-98.5 F (36.9 C)] 98.2 F (36.8 C) (09/07 1131) Pulse Rate:  [32-78] 57 (09/07 1600) Cardiac Rhythm: Normal sinus rhythm (09/07 0800) Resp:  [12-30] 24 (09/07 1600) BP: (84-145)/(52-105) 84/62 (09/07 1600) SpO2:  [92 %-100 %] 97 % (09/07 1600) Arterial Line BP: (76-130)/(42-65) 79/47 (09/07 1600)  Hemodynamic parameters for last 24 hours:    Intake/Output from previous day: 09/06 0701 - 09/07 0700 In: 652.5 [P.O.:360; I.V.:292.5] Out: 1905 [Urine:1905] Intake/Output this shift: Total I/O In: 804.7 [P.O.:520; I.V.:284.7] Out: 140 [Urine:140]  General appearance: alert, cooperative, and no distress Neurologic: intact Heart: regular rate and rhythm Lungs: clear to auscultation bilaterally Extremities: IABP right groin   Lab Results: Recent Labs    01/17/21 0045 01/18/21 0504  WBC 12.8* 12.0*  HGB 14.1 13.0  HCT 42.5 39.3  PLT 205 169   BMET:  Recent Labs    01/17/21 0045 01/18/21 0504  NA 136 135  K 4.0 4.0  CL 108 105  CO2 22 23  GLUCOSE 151* 113*  BUN 22 31*  CREATININE 1.15 1.39*  CALCIUM 8.8* 8.6*    PT/INR:  Recent Labs    01/16/21 0739  LABPROT 14.8  INR 1.2   ABG    Component Value Date/Time   TCO2 21 (L) 01/16/2021 0512   CBG (last 3)  Recent Labs    01/17/21 2126 01/18/21 0620 01/18/21 1627  GLUCAP 118* 121* 103*    Assessment/Plan: S/P Procedure(s) (LRB): Coronary/Graft Acute MI Revascularization (N/A) LEFT HEART CATH AND CORONARY ANGIOGRAPHY (N/A) IABP Insertion (N/A) Severe 2 vessel CAD s/p STEMi 75 yo man with hypertension and hyperlipidemia and no prior cardiac history presented with severe left arm pain and diaphoresis waking him from sleep at [facility]  Monday. Code STEMI- found to have 100% ostial LAD occlusion and severe ostial circumflex disease. LAD was angioplastied to reestablish flow and IABP placed. Was loaded with Brillinta. Currently stable and pain free with IABP in place on 1:2 Creatinine 1.4 today up from 1.0 baseline c/w AKI For CABG Friday 9/9. Will be 4 days from Brillinta load and STEMI, uncomfortable trying to wait over the weekend with IABP and risk of reocclusion  I discussed the general nature of the procedure, including the need for general anesthesia, the incisions to be used, the use of cardiopulmonary bypass, and drainage tubes and pacing wires with Timothy Munoz.  We discussed the expected hospital stay, overall recovery and short and long term outcomes.  I informed him of the indications, risks, benefits and alternatives.  He understands the risks include, but are not limited to death, stroke, MI, DVT/PE, bleeding, possible need for transfusion, infections, cardiac arrhythmias as well as other organ system dysfunction including respiratory, renal, or GI complications.   He accepts the risks and agrees to proceed.      LOS: 2 days    [name] 01/18/2021

## 2021-01-18 NOTE — Progress Notes (Signed)
Progress Note  Patient Name: Timothy Munoz Date of Encounter: 01/18/2021  [facility] Cardiologist: None New- Timothy Guadalajara MD  Subjective   Feels well this am. No chest pain or dyspnea. Tired of lying on back.  Inpatient Medications    Scheduled Meds:  aspirin EC  81 mg Oral Daily   atorvastatin  80 mg Oral Daily   Chlorhexidine Gluconate Cloth  6 each Topical Daily   insulin aspart  0-15 Units Subcutaneous TID WC   losartan  25 mg Oral Daily   metoprolol tartrate  12.5 mg Oral BID   primidone  50 mg Oral QHS   sodium chloride flush  3 mL Intravenous Q12H   Continuous Infusions:  sodium chloride     heparin 1,900 Units/hr (01/18/21 0411)   PRN Meds: sodium chloride, acetaminophen, diazepam, nitroGLYCERIN, ondansetron (ZOFRAN) IV, sodium chloride flush   Vital Signs    Vitals:   01/18/21 0510 01/18/21 0600 01/18/21 0700 01/18/21 0738  BP: 100/65 (!) 142/53 107/69   Pulse: 61 (!) 59 (!) 56   Resp: (!) Temp:    98.5 F (36.9 C)  TempSrc:    Oral  SpO2: 94% 94% 96%   Weight:      Height:        Intake/Output Summary (Last 24 hours) at 01/18/2021 0742 Last data filed at 01/18/2021 0400 Gross per 24 hour  Intake 652.53 ml  Output 1905 ml  Net -1252.47 ml    Last 3 Weights 01/17/2021 01/16/2021 08/29/2020  Weight (lbs) 258 lb 2.5 oz 260 lb 268 lb  Weight (kg) 117.1 kg 117.935 kg 121.564 kg      Telemetry    NSR - Personally Reviewed  ECG    NSR rate 73. Anterolateral STEMI with persistent ST elevation across precordium and Q waves - Personally Reviewed  Physical Exam   GEN: No acute distress.   Neck: No JVD Cardiac: RRR, no murmurs, rubs, or gallops.  Respiratory: Clear to auscultation bilaterally. GI: Soft, nontender, non-distended  MS: No edema; No deformity. iABP in place right  groin. No hematoma. Distal pulse good. Radial site without hematoma.  Neuro:  Nonfocal  Psych: Normal affect   Labs    High Sensitivity Troponin:   Recent  Labs  Lab 01/16/21 0739 01/16/21 0955  TROPONINIHS >24,000* >24,000*       Chemistry Recent Labs  Lab 01/16/21 0512 01/16/21 0739 01/17/21 0045  NA 141 137 136  K 4.0 4.1 4.0  CL 108 109 108  CO2  --  21* 22  GLUCOSE 146* 150* 151*  BUN 24* 23 22  CREATININE 1.00 1.19 1.15  CALCIUM  --  8.8* 8.8*  PROT  --  6.4*  --   ALBUMIN  --  3.7  --   AST  --  570*  --   ALT  --  86*  --   ALKPHOS  --  133*  --   BILITOT  --  0.5  --   GFRNONAA  --  >60 >60  ANIONGAP  --  7 6      Hematology Recent Labs  Lab 01/16/21 0512 01/17/21 0045 01/18/21 0504  WBC  --  12.8* 12.0*  RBC  --  4.81 4.41  HGB 13.9 14.1 13.0  HCT 41.0 42.5 39.3  MCV  --  88.4 89.1  MCH  --  29.3 29.5  MCHC  --  33.2 33.1  RDW  --  14.1  14.0  PLT  --  205 169     BNP Recent Labs  Lab 01/16/21 0739  BNP 44.2      DDimer No results for input(s): DDIMER in the last 168 hours.   Radiology    DG CHEST PORT 1 VIEW  Addendum Date: 01/17/2021   ADDENDUM REPORT: 01/17/2021 09:00 ADDENDUM: Findings discussed with Dr. Tresa Munoz via telephone at 8:52 a.m. Electronically Signed   By: Feliberto Harts M.D.   On: 01/17/2021 09:00   Result Date: 01/17/2021 CLINICAL DATA:  Encounter for management of intra-aortic balloon pump Z45.09 (ICD-10-CM) EXAM: PORTABLE CHEST 1 VIEW COMPARISON:  None. FINDINGS: The aortic balloon marker is positioned high, at the level of the aortic knob. Mildly enlarged appearance of the visualized aorta. Mild enlargement the cardiac silhouette. Lungs are clear. No visible pleural effusions or pneumothorax. No acute osseous abnormality. IMPRESSION: 1. The aortic balloon marker is positioned high, at the level of the aortic knob. 2. Mildly enlarged appearance of the visualized aorta. If available, recommend correlation with outside prior chest imaging to assess stability of this finding. If not available, a CTA of the chest could further evaluate for aortic aneurysm or other aortic pathology.  3. None cardiomegaly. Electronically Signed: By: Feliberto Harts M.D. On: 01/17/2021 08:42   ECHOCARDIOGRAM COMPLETE  Result Date: 01/16/2021    ECHOCARDIOGRAM REPORT   Patient Name:   Timothy Munoz Date of Exam: 01/16/2021 Medical Rec #:  [id number]       Height:       75.0 in Accession #:    [id number]      Weight:       260.0 lb Date of Birth:  [birth year]       BSA:          2.453 m Patient Age:    75 years        BP:           146/84 mmHg Patient Gender: M               HR:           72 bpm. Exam Location:  Inpatient Procedure: 2D Echo, Color Doppler and Cardiac Doppler Indications:    STEMI  History:        Patient has no prior history of Echocardiogram examinations.                 Acute MI.  Sonographer:    Timothy Munoz RDCS Referring Phys: [id number] Timothy Munoz IMPRESSIONS  1. Left ventricular ejection fraction, by estimation, is 35%. The left ventricle has moderately decreased function. The left ventricle demonstrates regional wall motion abnormalities with basal to mid anteroseptal, inferoseptal, and anterior akinesis, apical anterior akinesis, apical inferior akinesis. There is mild left ventricular hypertrophy. Left ventricular diastolic parameters are consistent with Grade I diastolic dysfunction (impaired relaxation).  2. Hypokinesis of the apical RV. Right ventricular systolic function is mildly reduced. The right ventricular size is normal. Tricuspid regurgitation signal is inadequate for assessing PA pressure.  3. The mitral valve is normal in structure. Trivial mitral valve regurgitation. No evidence of mitral stenosis.  4. The aortic valve is tricuspid. Aortic valve regurgitation is not visualized. No aortic stenosis is present.  5. Aortic dilatation noted. There is mild dilatation of the ascending aorta, measuring 42 mm.  6. The inferior vena cava is normal in size with greater than 50% respiratory variability, suggesting right atrial pressure of 3 mmHg. FINDINGS  Left Ventricle:  Left ventricular ejection fraction, by estimation, is 35%. The left ventricle has moderately decreased function. The left ventricle demonstrates regional wall motion abnormalities. The left ventricular internal cavity size was normal in size. There is mild left ventricular hypertrophy. Left ventricular diastolic parameters are consistent with Grade I diastolic dysfunction (impaired relaxation). Right Ventricle: Hypokinesis of the apical RV. The right ventricular size is normal. No increase in right ventricular wall thickness. Right ventricular systolic function is mildly reduced. Tricuspid regurgitation signal is inadequate for assessing PA pressure. Left Atrium: Left atrial size was normal in size. Right Atrium: Right atrial size was normal in size. Pericardium: There is no evidence of pericardial effusion. Mitral Valve: The mitral valve is normal in structure. Trivial mitral valve regurgitation. No evidence of mitral valve stenosis. Tricuspid Valve: The tricuspid valve is normal in structure. Tricuspid valve regurgitation is not demonstrated. Aortic Valve: The aortic valve is tricuspid. Aortic valve regurgitation is not visualized. No aortic stenosis is present. Pulmonic Valve: The pulmonic valve was normal in structure. Pulmonic valve regurgitation is trivial. Aorta: Aortic dilatation noted. There is mild dilatation of the ascending aorta, measuring 42 mm. Venous: The inferior vena cava is normal in size with greater than 50% respiratory variability, suggesting right atrial pressure of 3 mmHg. IAS/Shunts: No atrial level shunt detected by color flow Doppler.  LEFT VENTRICLE PLAX 2D LVIDd:         4.30 cm     Diastology LVIDs:         3.10 cm     LV e' medial:    7.07 cm/s LV PW:         1.10 cm     LV E/e' medial:  8.7 LV IVS:        1.20 cm     LV e' lateral:   7.29 cm/s LVOT diam:     1.90 cm     LV E/e' lateral: 8.4 LV SV:         48 LV SV Index:   20 LVOT Area:     2.84 cm  LV Volumes (MOD) LV vol d, MOD  A2C: 80.3 ml LV vol s, MOD A2C: 45.5 ml LV SV MOD A2C:     34.8 ml RIGHT VENTRICLE             IVC RV S prime:     17.70 cm/s  IVC diam: 1.90 cm TAPSE (M-mode): 2.5 cm LEFT ATRIUM           Index       RIGHT ATRIUM           Index LA diam:      3.30 cm 1.35 cm/m  RA Area:     14.30 cm LA Vol (A4C): 40.1 ml 16.35 ml/m RA Volume:   32.30 ml  13.17 ml/m  AORTIC VALVE LVOT Vmax:   110.00 cm/s LVOT Vmean:  68.600 cm/s LVOT VTI:    0.170 m  AORTA Ao Root diam: 3.90 cm Ao Asc diam:  4.20 cm MITRAL VALVE MV Area (PHT): 3.30 cm    SHUNTS MV Decel Time: 230 msec    Systemic VTI:  0.17 m MV E velocity: 61.20 cm/s  Systemic Diam: 1.90 cm MV A velocity: 94.30 cm/s MV E/A ratio:  0.65 Dalton McleanMD Electronically signed by Wilfred Lacyalton McleanMD Signature Date/Time: 01/16/2021/11:11:57 AM    Final     Cardiac Studies   Cardiac cath:  Coronary/Graft Acute MI Revascularization  IABP Insertion  LEFT HEART CATH  AND CORONARY ANGIOGRAPHY   Conclusion      Mid LM to Dist LM lesion is 20% stenosed.   Ost LAD to Prox LAD lesion is 100% stenosed.   Ost Cx lesion is 90% stenosed.   Ost RCA lesion is 20% stenosed.   Post intervention, there is a 60% residual stenosis.   There is moderate left ventricular systolic dysfunction.   LV end diastolic pressure is moderately elevated.   The left ventricular ejection fraction is 35-45% by visual estimate.   Acute anterolateral myocardial infarction secondary to total near ostial occlusion of a large LAD which wraps around the LV apex.   Large normal ramus intermediate vessel.   Left circumflex vessel with 85 to 90% ostial stenosis.   Large RCA with 20% irregularity proximally.   PTCA of the LAD ostium with hypotension and idioventricular rhythm with reperfusion requiring initiation of Levophed reestablishment of TIMI-3 flow to a large LAD vessel which wraps around the LV apex.   Insertion of an intra-aortic balloon pump for optimal hemodynamic support with augmented  diastolic pressure at 150 millimeters mercury.   RECOMMENDATION: In light of the patient's complex anatomy, stenting was not performed due to high likelihood of jailing a very large ramus intermediate vessel and with concomitant ostial circumflex stenosis it was felt that surgical revascularization once patient stabilizes from his myocardial infarction would be optimal therapy.  He did receive 1 dose of oral Brilinta in the laboratory which will not be continued.  We will plan 2D echo Doppler today.  Initiate aggressive lipid-lowering therapy.  Dr. Cliffton Asters was contacted who will see the patient and depending upon the patient;s hemodynamic stability perform CABG revascularization surgery this week following washout of his initial Brilinta dose.    Coronary Diagrams  Diagnostic Dominance: Right Intervention  Implants     Echo: IMPRESSIONS     1. Left ventricular ejection fraction, by estimation, is 35%. The left  ventricle has moderately decreased function. The left ventricle  demonstrates regional wall motion abnormalities with basal to mid  anteroseptal, inferoseptal, and anterior akinesis,  apical anterior akinesis, apical inferior akinesis. There is mild left  ventricular hypertrophy. Left ventricular diastolic parameters are  consistent with Grade I diastolic dysfunction (impaired relaxation).   2. Hypokinesis of the apical RV. Right ventricular systolic function is  mildly reduced. The right ventricular size is normal. Tricuspid  regurgitation signal is inadequate for assessing PA pressure.   3. The mitral valve is normal in structure. Trivial mitral valve  regurgitation. No evidence of mitral stenosis.   4. The aortic valve is tricuspid. Aortic valve regurgitation is not  visualized. No aortic stenosis is present.   5. Aortic dilatation noted. There is mild dilatation of the ascending  aorta, measuring 42 mm.   6. The inferior vena cava is normal in size with greater than 50%   respiratory variability, suggesting right atrial pressure of 3 mmHg.  Patient Profile     75 y.o. male with history of HTN, HLD presents with acute anterior STEMI  Assessment & Plan    Acute anterior STEMI. Patient presented 2 hours into onset of pain. Ecg showed extensive ST elevation in anterolateral leads with Q waves. Troponin > 24K. Underwent emergent cardiac cath with POBA of ostial LAD stenosis with restoration of flow but suboptimal result. Also has severe ostial LCx stenosis. EDP 27 mm Hg. IABP placed. Given anatomy it was felt that CABG would ultimately be best option for revascularization. He was given single loading  dose of Brilinta. Plan surgery after wash out. Discussed with Dr Cliffton Asters this am. Timing of surgery is likely Friday with Dr Dorris Fetch. EF 35% by Echo. CXR clear. On ASA, metoprolol, IV heparin. Also discussed IABP- CT surgery prefers leaving in for now to avoid risk of bleeding with sheath removal. Will continue at 1:2.  Will start  low dose losartan 25 mg daily. Anticipate transition to [facility] +/- aldactone post op. Given IV lasix x 1 yesterday with good urine output. Acute systolic CHF. EF 35%.  Continue current metoprolol dose. Start low dose losartan. IABP support today.  HTN controlled. HLD on high dose statin.  Pre diabetes. On SSI.  Tremor on primadone.   For questions or updates, please contact [facility] Please consult [email or web address] for contact info under        Signed, Jacquees Gongora [place], MD  01/18/2021, 7:42 AM

## 2021-01-19 ENCOUNTER — Inpatient Hospital Stay (HOSPITAL_COMMUNITY): Payer: Medicare HMO

## 2021-01-19 DIAGNOSIS — I10 Essential (primary) hypertension: Secondary | ICD-10-CM | POA: Diagnosis not present

## 2021-01-19 DIAGNOSIS — I517 Cardiomegaly: Secondary | ICD-10-CM | POA: Diagnosis not present

## 2021-01-19 DIAGNOSIS — I2510 Atherosclerotic heart disease of native coronary artery without angina pectoris: Secondary | ICD-10-CM

## 2021-01-19 DIAGNOSIS — E7800 Pure hypercholesterolemia, unspecified: Secondary | ICD-10-CM | POA: Diagnosis not present

## 2021-01-19 DIAGNOSIS — I2102 ST elevation (STEMI) myocardial infarction involving left anterior descending coronary artery: Secondary | ICD-10-CM

## 2021-01-19 DIAGNOSIS — N179 Acute kidney failure, unspecified: Secondary | ICD-10-CM | POA: Diagnosis not present

## 2021-01-19 DIAGNOSIS — I5021 Acute systolic (congestive) heart failure: Secondary | ICD-10-CM | POA: Diagnosis not present

## 2021-01-19 DIAGNOSIS — E785 Hyperlipidemia, unspecified: Secondary | ICD-10-CM | POA: Diagnosis not present

## 2021-01-19 LAB — COOXEMETRY PANEL
Carboxyhemoglobin: 1 % (ref 0.5–1.5)
Methemoglobin: 0.8 % (ref 0.0–1.5)
O2 Saturation: 29.8 %
Total hemoglobin: 13.1 g/dL (ref 12.0–16.0)

## 2021-01-19 LAB — CBC
HCT: 36.6 % — ABNORMAL LOW (ref 39.0–52.0)
Hemoglobin: 12 g/dL — ABNORMAL LOW (ref 13.0–17.0)
MCH: 29.6 pg (ref 26.0–34.0)
MCHC: 32.8 g/dL (ref 30.0–36.0)
MCV: 90.1 fL (ref 80.0–100.0)
Platelets: 155 10*3/uL (ref 150–400)
RBC: 4.06 MIL/uL — ABNORMAL LOW (ref 4.22–5.81)
RDW: 13.9 % (ref 11.5–15.5)
WBC: 11.3 10*3/uL — ABNORMAL HIGH (ref 4.0–10.5)
nRBC: 0 % (ref 0.0–0.2)

## 2021-01-19 LAB — BASIC METABOLIC PANEL
Anion gap: 7 (ref 5–15)
Anion gap: 9 (ref 5–15)
BUN: 40 mg/dL — ABNORMAL HIGH (ref 8–23)
BUN: 35 mg/dL — ABNORMAL HIGH (ref 8–23)
CO2: 23 mmol/L (ref 22–32)
CO2: 22 mmol/L (ref 22–32)
Calcium: 8.6 mg/dL — ABNORMAL LOW (ref 8.9–10.3)
Calcium: 8.3 mg/dL — ABNORMAL LOW (ref 8.9–10.3)
Chloride: 105 mmol/L (ref 98–111)
Chloride: 102 mmol/L (ref 98–111)
Creatinine, Ser: 1.67 mg/dL — ABNORMAL HIGH (ref 0.61–1.24)
Creatinine, Ser: 1.33 mg/dL — ABNORMAL HIGH (ref 0.61–1.24)
GFR, Estimated: 43 mL/min — ABNORMAL LOW (ref >60)
GFR, Estimated: 56 mL/min — ABNORMAL LOW (ref >60)
Glucose, Bld: 109 mg/dL — ABNORMAL HIGH (ref 70–99)
Glucose, Bld: 153 mg/dL — ABNORMAL HIGH (ref 70–99)
Potassium: 4.4 mmol/L (ref 3.5–5.1)
Potassium: 3.7 mmol/L (ref 3.5–5.1)
Sodium: 135 mmol/L (ref 135–145)
Sodium: 133 mmol/L — ABNORMAL LOW (ref 135–145)

## 2021-01-19 LAB — URINALYSIS, ROUTINE W REFLEX MICROSCOPIC
Bilirubin Urine: NEGATIVE
Glucose, UA: NEGATIVE mg/dL
Hgb urine dipstick: NEGATIVE
Ketones, ur: NEGATIVE mg/dL
Leukocytes,Ua: NEGATIVE
Nitrite: NEGATIVE
Protein, ur: NEGATIVE mg/dL
Specific Gravity, Urine: 1.025 (ref 1.005–1.030)
pH: 5.5 (ref 5.0–8.0)

## 2021-01-19 LAB — ECHOCARDIOGRAM COMPLETE
AR max vel: 3.6 cm2
AV Area VTI: 3.52 cm2
AV Area mean vel: 3.32 cm2
AV Mean grad: 4 mmHg
AV Peak grad: 6.5 mmHg
Ao pk vel: 1.27 m/s
Area-P 1/2: 4.49 cm2
Calc EF: 46.1 %
Height: 75 in
S' Lateral: 3.6 cm
Single Plane A2C EF: 41.1 %
Single Plane A4C EF: 49.7 %
Weight: 4151.7 oz

## 2021-01-19 LAB — GLUCOSE, CAPILLARY
Glucose-Capillary: 101 mg/dL — ABNORMAL HIGH (ref 70–99)
Glucose-Capillary: 102 mg/dL — ABNORMAL HIGH (ref 70–99)
Glucose-Capillary: 93 mg/dL (ref 70–99)
Glucose-Capillary: 135 mg/dL — ABNORMAL HIGH (ref 70–99)
Glucose-Capillary: 100 mg/dL — ABNORMAL HIGH (ref 70–99)

## 2021-01-19 LAB — ABO/RH: ABO/RH(D): O POS

## 2021-01-19 LAB — HEPARIN LEVEL (UNFRACTIONATED): Heparin Unfractionated: 0.38 IU/mL (ref 0.30–0.70)

## 2021-01-19 MED ORDER — INSULIN REGULAR(HUMAN) IN NACL 100-0.9 UT/100ML-% IV SOLN
INTRAVENOUS | Status: AC
  Administered 2021-01-20: 1.3 [IU]/h via INTRAVENOUS
  Filled 2021-01-19: qty 100

## 2021-01-19 MED ORDER — SODIUM CHLORIDE 0.9 % IV SOLN: INTRAVENOUS | Status: DC

## 2021-01-19 MED ORDER — DIAZEPAM 2 MG PO TABS
2.0000 mg | ORAL_TABLET | Freq: Once | ORAL | Status: AC
  Administered 2021-01-20: 2 mg via ORAL
  Filled 2021-01-19: qty 1

## 2021-01-19 MED ORDER — MILRINONE LACTATE IN DEXTROSE 20-5 MG/100ML-% IV SOLN
0.3000 ug/kg/min | INTRAVENOUS | Status: AC
  Administered 2021-01-20: .25 ug/kg/min via INTRAVENOUS
  Filled 2021-01-19: qty 100

## 2021-01-19 MED ORDER — CHLORHEXIDINE GLUCONATE CLOTH 2 % EX PADS
6.0000 | MEDICATED_PAD | Freq: Once | CUTANEOUS | Status: AC
Start: 2021-01-19 — End: 2021-01-20
  Administered 2021-01-20: 6 via TOPICAL

## 2021-01-19 MED ORDER — PLASMA-LYTE A IV SOLN
INTRAVENOUS | Status: DC
  Filled 2021-01-19: qty 5

## 2021-01-19 MED ORDER — TRANEXAMIC ACID (OHS) BOLUS VIA INFUSION
15.0000 mg/kg | INTRAVENOUS | Status: AC
  Administered 2021-01-20: 1765.5 mg via INTRAVENOUS
  Filled 2021-01-19: qty 1766

## 2021-01-19 MED ORDER — NITROGLYCERIN IN D5W 200-5 MCG/ML-% IV SOLN
2.0000 ug/min | INTRAVENOUS | Status: DC
Start: 2021-01-20 — End: 2021-01-20
  Filled 2021-01-19: qty 250

## 2021-01-19 MED ORDER — TRANEXAMIC ACID (OHS) PUMP PRIME SOLUTION
2.0000 mg/kg | INTRAVENOUS | Status: DC
  Filled 2021-01-19: qty 2.35

## 2021-01-19 MED ORDER — POTASSIUM CHLORIDE 2 MEQ/ML IV SOLN
80.0000 meq | INTRAVENOUS | Status: DC
  Filled 2021-01-19: qty 40

## 2021-01-19 MED ORDER — SORBITOL 70 % SOLN
30.0000 mL | Freq: Once | Status: AC
  Administered 2021-01-19: 30 mL via ORAL
  Filled 2021-01-19: qty 30

## 2021-01-19 MED ORDER — SODIUM CHLORIDE 0.9 % IV SOLN
INTRAVENOUS | Status: DC
  Filled 2021-01-19: qty 30

## 2021-01-19 MED ORDER — CEFAZOLIN SODIUM-DEXTROSE 2-4 GM/100ML-% IV SOLN
2.0000 g | INTRAVENOUS | Status: AC
  Administered 2021-01-20 (×2): 2 g via INTRAVENOUS
  Filled 2021-01-19: qty 100

## 2021-01-19 MED ORDER — CHLORHEXIDINE GLUCONATE 0.12 % MT SOLN
15.0000 mL | Freq: Once | OROMUCOSAL | Status: AC
  Administered 2021-01-20: 15 mL via OROMUCOSAL
  Filled 2021-01-19: qty 15

## 2021-01-19 MED ORDER — CEFAZOLIN SODIUM-DEXTROSE 2-4 GM/100ML-% IV SOLN
2.0000 g | INTRAVENOUS | Status: DC
  Filled 2021-01-19: qty 100

## 2021-01-19 MED ORDER — TRANEXAMIC ACID 1000 MG/10ML IV SOLN
1.5000 mg/kg/h | INTRAVENOUS | Status: AC
  Administered 2021-01-20: 1.5 mg/kg/h via INTRAVENOUS
  Filled 2021-01-19: qty 25

## 2021-01-19 MED ORDER — METOPROLOL TARTRATE 12.5 MG HALF TABLET
12.5000 mg | ORAL_TABLET | Freq: Once | ORAL | Status: AC
  Administered 2021-01-20: 12.5 mg via ORAL
  Filled 2021-01-19: qty 1

## 2021-01-19 MED ORDER — BISACODYL 5 MG PO TBEC: 5.0000 mg | DELAYED_RELEASE_TABLET | Freq: Once | ORAL | Status: DC

## 2021-01-19 MED ORDER — PHENYLEPHRINE HCL-NACL 20-0.9 MG/250ML-% IV SOLN
30.0000 ug/min | INTRAVENOUS | Status: AC
Start: 2021-01-20 — End: 2021-01-20
  Administered 2021-01-20: 40 ug/min via INTRAVENOUS
  Filled 2021-01-19: qty 250

## 2021-01-19 MED ORDER — NOREPINEPHRINE 4 MG/250ML-% IV SOLN
0.0000 ug/min | INTRAVENOUS | Status: AC
  Administered 2021-01-20: 5 ug/min via INTRAVENOUS
  Filled 2021-01-19: qty 250

## 2021-01-19 MED ORDER — VANCOMYCIN HCL 1500 MG/300ML IV SOLN
1500.0000 mg | INTRAVENOUS | Status: AC
  Administered 2021-01-20: 1500 mg via INTRAVENOUS
  Filled 2021-01-19: qty 300

## 2021-01-19 MED ORDER — MAGNESIUM SULFATE 50 % IJ SOLN
40.0000 meq | INTRAMUSCULAR | Status: DC
  Filled 2021-01-19: qty 9.85

## 2021-01-19 MED ORDER — DEXMEDETOMIDINE HCL IN NACL 400 MCG/100ML IV SOLN
0.1000 ug/kg/h | INTRAVENOUS | Status: AC
  Administered 2021-01-20: .3 ug/kg/h via INTRAVENOUS
  Filled 2021-01-19: qty 100

## 2021-01-19 MED ORDER — EPINEPHRINE HCL 5 MG/250ML IV SOLN IN NS
0.0000 ug/min | INTRAVENOUS | Status: DC
  Filled 2021-01-19: qty 250

## 2021-01-19 MED ORDER — CHLORHEXIDINE GLUCONATE CLOTH 2 % EX PADS
6.0000 | MEDICATED_PAD | Freq: Once | CUTANEOUS | Status: AC
  Administered 2021-01-19: 6 via TOPICAL

## 2021-01-19 NOTE — Progress Notes (Signed)
*  PRELIMINARY RESULTS* Echocardiogram 2D Echocardiogram has been performed.  Neomia Dear RDCS 01/19/2021, 12:50 PM

## 2021-01-19 NOTE — H&P (View-Only) (Signed)
      [address]       [place] [address]             [phone]      No new events today  BP better with hydration  Co-ox low but was peripheral stick Creatinine better on evening labs  Plan CABG in Am if creatinine oK  Mounir Skipper C. Camia Dipinto, MD [facility] [phone]  

## 2021-01-19 NOTE — Progress Notes (Signed)
Progress Note  Patient Name: Timothy Munoz Date of Encounter: 01/19/2021  [facility] Cardiologist: None New- [name] MD  Subjective   Feels well this am. No chest pain or dyspnea. Tired of lying on back.  Inpatient Medications    Scheduled Meds:  aspirin EC  81 mg Oral Daily   atorvastatin  80 mg Oral Daily   Chlorhexidine Gluconate Cloth  6 each Topical Daily   insulin aspart  0-15 Units Subcutaneous TID WC   mupirocin ointment  1 application Nasal BID   primidone  50 mg Oral QHS   sodium chloride flush  3 mL Intravenous Q12H   Continuous Infusions:  sodium chloride     sodium chloride     heparin 1,900 Units/hr (01/19/21 0707)   PRN Meds: sodium chloride, acetaminophen, diazepam, nitroGLYCERIN, ondansetron (ZOFRAN) IV, sodium chloride flush   Vital Signs    Vitals:   01/19/21 0600 01/19/21 0610 01/19/21 0700 01/19/21 0735  BP: 102/66 99/67 109/67   Pulse: 74 74 74   Resp: (!) Temp:    98.4 F (36.9 C)  TempSrc:    Oral  SpO2: 92% 100% 96%   Weight: 117.7 kg     Height:        Intake/Output Summary (Last 24 hours) at 01/19/2021 0808 Last data filed at 01/19/2021 0707 Gross per 24 hour  Intake 1278.66 ml  Output 1140 ml  Net 138.66 ml    Last 3 Weights 01/19/2021 01/17/2021 01/16/2021  Weight (lbs) 259 lb 7.7 oz 258 lb 2.5 oz 260 lb  Weight (kg) 117.7 kg 117.1 kg 117.935 kg      Telemetry    NSR - Personally Reviewed  ECG    NSR rate 73. Anterolateral STEMI with persistent ST elevation across precordium and Q waves - Personally Reviewed  Physical Exam   GEN: No acute distress.   Neck: No JVD Cardiac: RRR, no murmurs, rubs, or gallops.  Respiratory: Clear to auscultation bilaterally. GI: Soft, nontender, non-distended  MS: No edema; No deformity. iABP in place right  groin. No hematoma. Distal pulse good. Radial site without hematoma.  Neuro:  Nonfocal  Psych: Normal affect   Labs    High Sensitivity Troponin:   Recent Labs   Lab 01/16/21 0739 01/16/21 0955  TROPONINIHS >24,000* >24,000*       Chemistry Recent Labs  Lab 01/16/21 0739 01/17/21 0045 01/18/21 0504 01/19/21 0141  NA 137 136 135 135  K 4.1 4.0 4.0 4.4  CL 109 108 105 105  CO2 21* GLUCOSE 150* 151* 113* 109*  BUN 23 22 31* 40*  CREATININE 1.19 1.15 1.39* 1.67*  CALCIUM 8.8* 8.8* 8.6* 8.6*  PROT 6.4*  --   --   --   ALBUMIN 3.7  --   --   --   AST 570*  --   --   --   ALT 86*  --   --   --   ALKPHOS 133*  --   --   --   BILITOT 0.5  --   --   --   GFRNONAA >60 >60 53* 43*  ANIONGAP Hematology Recent Labs  Lab 01/17/21 0045 01/18/21 0504 01/19/21 0141  WBC 12.8* 12.0* 11.3*  RBC 4.81 4.41 4.06*  HGB 14.1 13.0 12.0*  HCT 42.5 39.3 36.6*  MCV 88.4 89.1 90.1  MCH 29.3 29.5 29.6  MCHC 33.2 33.1 32.8  RDW 14.1 14.0 13.9  PLT 205 169 155     BNP Recent Labs  Lab 01/16/21 0739  BNP 44.2      DDimer No results for input(s): DDIMER in the last 168 hours.   Radiology    VAS US DOPPLER PRE CABG  Result Date: 01/18/2021 PREOPERATIVE VASCULAR EVALUATION Patient Name:  ABELARDO SEIDNER  Date of Exam:   01/18/2021 Medical Rec #: [id number]        Accession #:    [id number] Date of Birth: 1945-09-18        Patient Gender: M Patient Age:   75 years Exam Location:  [facility] Procedure:      VAS US DOPPLER PRE CABG Referring Phys: HARRELL LIGHTFOOT --------------------------------------------------------------------------------  Indications:      Pre-CABG. Risk Factors:     Hypertension, hyperlipidemia. Limitations:      patient on balloon pump- unable to evaluate waveforms Comparison Study: nov prior Performing Technologist: [place] Ponder RVS  Examination Guidelines: A complete evaluation includes B-mode imaging, spectral Doppler, color Doppler, and power Doppler as needed of all accessible portions of each vessel. Bilateral testing is considered an integral part of a complete examination. Limited  examinations for reoccurring indications may be performed as noted.  Right Carotid Findings: +----------+--------+--------+--------+--------+-------------------------------+           PSV cm/sEDV cm/sStenosisDescribeComments                        +----------+--------+--------+--------+--------+-------------------------------+ CCA Prox                                  unable to accurately obtain                                               waveforms due to balloon pump   +----------+--------+--------+--------+--------+-------------------------------+ CCA Distal                                unable to accurately obtain                                               waveforms due to balloon pump   +----------+--------+--------+--------+--------+-------------------------------+ ICA Prox                                  unable to accurately obtain                                               waveforms due to balloon pump   +----------+--------+--------+--------+--------+-------------------------------+ ECA                                       unable to accurately obtain  waveforms due to balloon pump   +----------+--------+--------+--------+--------+-------------------------------+  Left Carotid Findings: +----------+--------+--------+--------+--------+-------------------------------+           PSV cm/sEDV cm/sStenosisDescribeComments                        +----------+--------+--------+--------+--------+-------------------------------+ CCA Prox                                  unable to accurately obtain                                               waveforms due to balloon pump   +----------+--------+--------+--------+--------+-------------------------------+ CCA Distal                                unable to accurately obtain                                                waveforms due to balloon pump   +----------+--------+--------+--------+--------+-------------------------------+ ICA Prox                                  unable to accurately obtain                                               waveforms due to balloon pump   +----------+--------+--------+--------+--------+-------------------------------+ ECA                                       unable to accurately obtain                                               waveforms due to balloon pump   +----------+--------+--------+--------+--------+-------------------------------+  ABI Findings: +--------+------------------+-----+--------+-----------------------------------+ Right   Rt Pressure (mmHg)IndexWaveformComment                             +--------+------------------+-----+--------+-----------------------------------+ [id number]                            unable to obtain waveforms due to                                          balloon pump                        +--------+------------------+-----+--------+-----------------------------------+ PTA     127               1.10         unable to obtain waveforms due to  balloon pump                        +--------+------------------+-----+--------+-----------------------------------+ DP      122               1.06         unable to obtain waveforms due to                                          balloon pump                        +--------+------------------+-----+--------+-----------------------------------+ +--------+------------------+-----+--------+-----------------------------------+ Left    Lt Pressure (mmHg)IndexWaveformComment                             +--------+------------------+-----+--------+-----------------------------------+ [id number]                            unable to obtain waveforms due to                                           balloon pump                        +--------+------------------+-----+--------+-----------------------------------+ PTA     109               0.95         unable to obtain waveforms due to                                          balloon pump                        +--------+------------------+-----+--------+-----------------------------------+ DP      103               0.90         unable to obtain waveforms due to                                          balloon pump                        +--------+------------------+-----+--------+-----------------------------------+  Right Doppler Findings: +--------+--------+-----+-------+----------------------------------------------+ Site    PressureIndexDopplerComments                                       +--------+--------+-----+-------+----------------------------------------------+ [id number]                 unable to obtain waveforms due to balloon pump +--------+--------+-----+-------+----------------------------------------------+ Radial                      unable to accurately obtain waveforms due to  balloon pump                                   +--------+--------+-----+-------+----------------------------------------------+ Ulnar                       unable to accurately obtain waveforms due to                               balloon pump                                   +--------+--------+-----+-------+----------------------------------------------+  Left Doppler Findings: +--------+--------+-----+-------+----------------------------------------------+ Site    PressureIndexDopplerComments                                       +--------+--------+-----+-------+----------------------------------------------+ [id number]                 unable to obtain waveforms due to balloon pump  +--------+--------+-----+-------+----------------------------------------------+ Radial                      unable to accurately obtain waveforms due to                               balloon pump                                   +--------+--------+-----+-------+----------------------------------------------+ Ulnar                       unable to accurately obtain waveforms due to                               balloon pump                                   +--------+--------+-----+-------+----------------------------------------------+  Summary: Right Carotid: No obvious evidence of stenosis in the Right ICA- unable to                accurately obtain waveforms/ velocities due to balloon pump. Left Carotid: No obvious evidence of stenosis in the Left ICA -unable to               accurately obtain waveforms/ velocities due to balloon pump. Right ABI: Unable to accurately obtain waveforms due to balloon pump. Left ABI: Unable to accurately obtain waveforms due to balloon pump. Right Upper Extremity: Doppler waveforms decrease 50% with right radial compression. Doppler waveforms remain within normal limits with right ulnar compression. Left Upper Extremity: Doppler waveforms decrease 50% with left radial compression. Doppler waveforms remain within normal limits with left ulnar compression.  Electronically signed by Sherald Hess MD on 01/18/2021 at 2:37:02 PM.    Final     Cardiac Studies   Cardiac cath:  Coronary/Graft Acute MI Revascularization  IABP Insertion  LEFT HEART CATH AND CORONARY ANGIOGRAPHY   Conclusion      Mid LM to Dist LM lesion is 20% stenosed.  Ost LAD to Prox LAD lesion is 100% stenosed.   Ost Cx lesion is 90% stenosed.   Ost RCA lesion is 20% stenosed.   Post intervention, there is a 60% residual stenosis.   There is moderate left ventricular systolic dysfunction.   LV end diastolic pressure is moderately elevated.   The left ventricular ejection  fraction is 35-45% by visual estimate.   Acute anterolateral myocardial infarction secondary to total near ostial occlusion of a large LAD which wraps around the LV apex.   Large normal ramus intermediate vessel.   Left circumflex vessel with 85 to 90% ostial stenosis.   Large RCA with 20% irregularity proximally.   PTCA of the LAD ostium with hypotension and idioventricular rhythm with reperfusion requiring initiation of Levophed reestablishment of TIMI-3 flow to a large LAD vessel which wraps around the LV apex.   Insertion of an intra-aortic balloon pump for optimal hemodynamic support with augmented diastolic pressure at 150 millimeters mercury.   RECOMMENDATION: In light of the patient's complex anatomy, stenting was not performed due to high likelihood of jailing a very large ramus intermediate vessel and with concomitant ostial circumflex stenosis it was felt that surgical revascularization once patient stabilizes from his myocardial infarction would be optimal therapy.  He did receive 1 dose of oral Brilinta in the laboratory which will not be continued.  We will plan 2D echo Doppler today.  Initiate aggressive lipid-lowering therapy.  Dr. Cliffton AstersLightfoot was contacted who will see the patient and depending upon the patient;s hemodynamic stability perform CABG revascularization surgery this week following washout of his initial Brilinta dose.    Coronary Diagrams  Diagnostic Dominance: Right Intervention  Implants     Echo: IMPRESSIONS     1. Left ventricular ejection fraction, by estimation, is 35%. The left  ventricle has moderately decreased function. The left ventricle  demonstrates regional wall motion abnormalities with basal to mid  anteroseptal, inferoseptal, and anterior akinesis,  apical anterior akinesis, apical inferior akinesis. There is mild left  ventricular hypertrophy. Left ventricular diastolic parameters are  consistent with Grade I diastolic dysfunction  (impaired relaxation).   2. Hypokinesis of the apical RV. Right ventricular systolic function is  mildly reduced. The right ventricular size is normal. Tricuspid  regurgitation signal is inadequate for assessing PA pressure.   3. The mitral valve is normal in structure. Trivial mitral valve  regurgitation. No evidence of mitral stenosis.   4. The aortic valve is tricuspid. Aortic valve regurgitation is not  visualized. No aortic stenosis is present.   5. Aortic dilatation noted. There is mild dilatation of the ascending  aorta, measuring 42 mm.   6. The inferior vena cava is normal in size with greater than 50%  respiratory variability, suggesting right atrial pressure of 3 mmHg.  Patient Profile     75 y.o. male with history of HTN, HLD presents with acute anterior STEMI  Assessment & Plan    Acute anterior STEMI. Patient presented 2 hours into onset of pain. Ecg showed extensive ST elevation in anterolateral leads with Q waves. Troponin > 24K. Underwent emergent cardiac cath with POBA of ostial LAD stenosis with restoration of flow but suboptimal result. Also has severe ostial LCx stenosis. EDP 27 mm Hg. IABP placed. Given anatomy it was felt that CABG would ultimately be best option for revascularization. He was given single loading dose of Brilinta. Plan surgery after wash out. Plan surgery Friday with Dr Dorris FetchHendrickson. EF 35% by Echo. CXR personally reviewed today  and is clear. IABP in good position. On ASA,IV heparin. Will hold metoprolol due to low BP. Also increase IABP back to 1:1. Losartan held due to low BP and AKI Acute systolic CHF. EF 35%.  Continue IABP support. Medical therapy limited by low BP and AKI.  HTN BP low now HLD on high dose statin.  Pre diabetes. On SSI.  Tremor on primadone.  AKI creatinine increased to 1.67. likely due to ATN with contrast, low output with MI, diuresis and ARB. Will hold diuretics and ARB. IABP for optimal BP support. Will gently hydrate today.  Monitor renal function closely.  For questions or updates, please contact [facility] Please consult [email or web address] for contact info under        Signed, Hisham Provence [place], MD  01/19/2021, 8:08 AM

## 2021-01-19 NOTE — Progress Notes (Signed)
      [address]       [place] [address]             [phone]      No new events today  BP better with hydration  Co-ox low but was peripheral stick Creatinine better on evening labs  Plan CABG in Am if creatinine oK  Viviann Spare C. Dorris Fetch, MD [facility] [phone]

## 2021-01-19 NOTE — Progress Notes (Addendum)
3 Days Post-Op Procedure(s) (LRB): Coronary/Graft Acute MI Revascularization (N/A) LEFT HEART CATH AND CORONARY ANGIOGRAPHY (N/A) IABP Insertion (N/A) Subjective: No complaints this AM  Objective: Vital signs in last 24 hours: Temp:  [98.1 F (36.7 C)-98.4 F (36.9 C)] 98.4 F (36.9 C) (09/08 0735) Pulse Rate:  [31-191] 62 (09/08 0900) Cardiac Rhythm: Normal sinus rhythm (09/08 0800) Resp:  [12-26] 22 (09/08 0900) BP: (84-144)/(47-70) 121/47 (09/08 0900) SpO2:  [91 %-100 %] 97 % (09/08 0900) Arterial Line BP: (76-121)/(42-64) 97/51 (09/08 0900) Weight:  [117.7 kg] 117.7 kg (09/08 0600)  Hemodynamic parameters for last 24 hours:    Intake/Output from previous day: 09/07 0701 - 09/08 0700 In: 1510.4 [P.O.:960; I.V.:550.4] Out: 1140 [Urine:1140] Intake/Output this shift: Total I/O In: 19 [I.V.:19] Out: -   General appearance: alert, cooperative, and no distress Neurologic: intact Heart: regular rate and rhythm Lungs: clear to auscultation bilaterally  Lab Results: Recent Labs    01/18/21 0504 01/19/21 0141  WBC 12.0* 11.3*  HGB 13.0 12.0*  HCT 39.3 36.6*  PLT 169 155   BMET:  Recent Labs    01/18/21 0504 01/19/21 0141  NA 135 135  K 4.0 4.4  CL 105 105  CO2 23 23  GLUCOSE 113* 109*  BUN 31* 40*  CREATININE 1.39* 1.67*  CALCIUM 8.6* 8.6*    PT/INR: No results for input(s): LABPROT, INR in the last 72 hours. ABG    Component Value Date/Time   TCO2 21 (L) 01/16/2021 0512   CBG (last 3)  Recent Labs    01/18/21 1627 01/18/21 2118 01/19/21 0615  GLUCAP 103* 110* 102*    Assessment/Plan: S/P Procedure(s) (LRB): Coronary/Graft Acute MI Revascularization (N/A) LEFT HEART CATH AND CORONARY ANGIOGRAPHY (N/A) IABP Insertion (N/A) S/p STEMi with PCI LAD Still on IABP, now at 1:1, in good position by CXR Creatinine up to 1.67 c/w AKI. Will check bladder scan to r/o obstruction Will check co-ox and echo Tentatively plan for CABG tomorrow depending  on renal function   LOS: 3 days    [name] 01/19/2021

## 2021-01-19 NOTE — Progress Notes (Signed)
Results of Co-ox called to Cardiology. Unsure of accuracy d/t this is from a peripheral stick, not a central line. Patient does not have a Central access to draw an accurate Co-ox from. No new orders at this time.

## 2021-01-19 NOTE — Progress Notes (Signed)
   Called with Coox of 30. Unclear of how to interpret as this was a peripheral stick, not central access from PICC line. In review of chart his blood pressures are stable and CXR without significant edema this morning. Cr up from 1.39>>1.67 today, ARB was held. IABP remains 1:1. He remains alert, no distress. Did discuss with attending to ensure no concern regarding decompensation from this morning. No concern for low out this afternoon. Discussed with RN, they will update with any changes.   SignedLaverda Page, NP-C 01/19/2021, 4:09 PM Pager: [phone]

## 2021-01-19 NOTE — Progress Notes (Signed)
Per patient request visitors entrance notified at this time that pt's daughter and grand-daughter are the only visitors that the patient wants allowed to see him.   Daughter- Marca Ancona Grand-daughter- Micheline Rough

## 2021-01-19 NOTE — Progress Notes (Addendum)
ANTICOAGULATION CONSULT NOTE  Pharmacy Consult for heparin Indication:  IABP  No Known Allergies  Patient Measurements: Height: 6\' 3"  (190.5 cm) Weight: 117.7 kg (259 lb 7.7 oz) IBW/kg (Calculated) : 84.5 Heparin Dosing Weight: 100kg  Vital Signs: Temp: 98.4 F (36.9 C) (09/08 0735) Temp Source: Oral (09/08 0735) BP: 109/67 (09/08 0700) Pulse Rate: 74 (09/08 0700)  Labs: Recent Labs    01/16/21 0955 01/16/21 1749 01/17/21 0045 01/17/21 1029 01/17/21 2109 01/18/21 0504 01/19/21 0141  HGB  --    < > 14.1  --   --  13.0 12.0*  HCT  --   --  42.5  --   --  39.3 36.6*  PLT  --   --  205  --   --  169 155  HEPARINUNFRC  --    < > 0.14*   < > 0.16* 0.35 0.38  CREATININE  --   --  1.15  --   --  1.39* 1.67*  TROPONINIHS >24,000*  --   --   --   --   --   --    < > = values in this interval not displayed.     Estimated Creatinine Clearance: 53.7 mL/min (A) (by C-G formula based on SCr of 1.67 mg/dL (H)).   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 Plan for CABG and IABP removal on 9/9.  Heparin level therapeutic (0.38) on gtt at 1900 units/hr. No bleeding noted from balloon pump site. Hemoglobin trending down, will continue to monitor. Platelet count 155.  Goal of Therapy:  Heparin level 0.2-0.5 units/ml Monitor platelets by anticoagulation protocol: Yes   Plan:  Continue Heparin infusion at 1900 units/hr  Daily heparin level and CBC  Thank you for allowing pharmacy to participate in this patient's care.  11/9, PharmD PGY1 Pharmacy Resident 01/19/2021 8:07 AM Check [email or web address] for unit specific pharmacy number

## 2021-01-20 ENCOUNTER — Inpatient Hospital Stay (HOSPITAL_COMMUNITY): Payer: Medicare HMO | Admitting: Certified Registered"

## 2021-01-20 ENCOUNTER — Inpatient Hospital Stay (HOSPITAL_COMMUNITY)
Admission: EM | Disposition: A | Discharge status: Home / Self Care | Payer: Self-pay | Source: Home / Self Care | Attending: Thoracic Surgery (Cardiothoracic Vascular Surgery)

## 2021-01-20 ENCOUNTER — Inpatient Hospital Stay (HOSPITAL_COMMUNITY): Payer: Medicare HMO

## 2021-01-20 DIAGNOSIS — Z951 Presence of aortocoronary bypass graft: Secondary | ICD-10-CM

## 2021-01-20 DIAGNOSIS — I2510 Atherosclerotic heart disease of native coronary artery without angina pectoris: Secondary | ICD-10-CM | POA: Diagnosis present

## 2021-01-20 DIAGNOSIS — Z4682 Encounter for fitting and adjustment of non-vascular catheter: Secondary | ICD-10-CM | POA: Diagnosis not present

## 2021-01-20 DIAGNOSIS — J984 Other disorders of lung: Secondary | ICD-10-CM | POA: Diagnosis not present

## 2021-01-20 DIAGNOSIS — I25110 Atherosclerotic heart disease of native coronary artery with unstable angina pectoris: Secondary | ICD-10-CM

## 2021-01-20 HISTORY — PX: CORONARY ARTERY BYPASS GRAFT: SHX141

## 2021-01-20 HISTORY — PX: ENDOVEIN HARVEST OF GREATER SAPHENOUS VEIN: SHX5059

## 2021-01-20 HISTORY — PX: TEE WITHOUT CARDIOVERSION: SHX5443

## 2021-01-20 LAB — POCT I-STAT, CHEM 8
BUN: 30 mg/dL — ABNORMAL HIGH (ref 8–23)
BUN: 29 mg/dL — ABNORMAL HIGH (ref 8–23)
BUN: 29 mg/dL — ABNORMAL HIGH (ref 8–23)
BUN: 28 mg/dL — ABNORMAL HIGH (ref 8–23)
BUN: 31 mg/dL — ABNORMAL HIGH (ref 8–23)
Calcium, Ion: 1.27 mmol/L (ref 1.15–1.40)
Calcium, Ion: 1.15 mmol/L (ref 1.15–1.40)
Calcium, Ion: 1.08 mmol/L — ABNORMAL LOW (ref 1.15–1.40)
Calcium, Ion: 1.08 mmol/L — ABNORMAL LOW (ref 1.15–1.40)
Calcium, Ion: 1.25 mmol/L (ref 1.15–1.40)
Chloride: 106 mmol/L (ref 98–111)
Chloride: 109 mmol/L (ref 98–111)
Chloride: 108 mmol/L (ref 98–111)
Chloride: 106 mmol/L (ref 98–111)
Chloride: 106 mmol/L (ref 98–111)
Creatinine, Ser: 1.1 mg/dL (ref 0.61–1.24)
Creatinine, Ser: 1.1 mg/dL (ref 0.61–1.24)
Creatinine, Ser: 1.1 mg/dL (ref 0.61–1.24)
Creatinine, Ser: 1.1 mg/dL (ref 0.61–1.24)
Creatinine, Ser: 1.1 mg/dL (ref 0.61–1.24)
Glucose, Bld: 133 mg/dL — ABNORMAL HIGH (ref 70–99)
Glucose, Bld: 119 mg/dL — ABNORMAL HIGH (ref 70–99)
Glucose, Bld: 120 mg/dL — ABNORMAL HIGH (ref 70–99)
Glucose, Bld: 128 mg/dL — ABNORMAL HIGH (ref 70–99)
Glucose, Bld: 164 mg/dL — ABNORMAL HIGH (ref 70–99)
HCT: 33 % — ABNORMAL LOW (ref 39.0–52.0)
HCT: 31 % — ABNORMAL LOW (ref 39.0–52.0)
HCT: 23 % — ABNORMAL LOW (ref 39.0–52.0)
HCT: 20 % — ABNORMAL LOW (ref 39.0–52.0)
HCT: 26 % — ABNORMAL LOW (ref 39.0–52.0)
Hemoglobin: 11.2 g/dL — ABNORMAL LOW (ref 13.0–17.0)
Hemoglobin: 10.5 g/dL — ABNORMAL LOW (ref 13.0–17.0)
Hemoglobin: 7.8 g/dL — ABNORMAL LOW (ref 13.0–17.0)
Hemoglobin: 6.8 g/dL — CL (ref 13.0–17.0)
Hemoglobin: 8.8 g/dL — ABNORMAL LOW (ref 13.0–17.0)
Potassium: 4.1 mmol/L (ref 3.5–5.1)
Potassium: 4.4 mmol/L (ref 3.5–5.1)
Potassium: 5.1 mmol/L (ref 3.5–5.1)
Potassium: 5.9 mmol/L — ABNORMAL HIGH (ref 3.5–5.1)
Potassium: 5.6 mmol/L — ABNORMAL HIGH (ref 3.5–5.1)
Sodium: 137 mmol/L (ref 135–145)
Sodium: 136 mmol/L (ref 135–145)
Sodium: 137 mmol/L (ref 135–145)
Sodium: 134 mmol/L — ABNORMAL LOW (ref 135–145)
Sodium: 135 mmol/L (ref 135–145)
TCO2: 23 mmol/L (ref 22–32)
TCO2: 24 mmol/L (ref 22–32)
TCO2: 24 mmol/L (ref 22–32)
TCO2: 26 mmol/L (ref 22–32)
TCO2: 26 mmol/L (ref 22–32)

## 2021-01-20 LAB — POCT I-STAT 7, (LYTES, BLD GAS, ICA,H+H)
Acid-base deficit: 4 mmol/L — ABNORMAL HIGH (ref 0.0–2.0)
Acid-base deficit: 4 mmol/L — ABNORMAL HIGH (ref 0.0–2.0)
Acid-base deficit: 1 mmol/L (ref 0.0–2.0)
Acid-base deficit: 4 mmol/L — ABNORMAL HIGH (ref 0.0–2.0)
Acid-base deficit: 5 mmol/L — ABNORMAL HIGH (ref 0.0–2.0)
Acid-base deficit: 5 mmol/L — ABNORMAL HIGH (ref 0.0–2.0)
Acid-base deficit: 6 mmol/L — ABNORMAL HIGH (ref 0.0–2.0)
Acid-base deficit: 4 mmol/L — ABNORMAL HIGH (ref 0.0–2.0)
Bicarbonate: 22.3 mmol/L (ref 20.0–28.0)
Bicarbonate: 22.8 mmol/L (ref 20.0–28.0)
Bicarbonate: 24.1 mmol/L (ref 20.0–28.0)
Bicarbonate: 23.5 mmol/L (ref 20.0–28.0)
Bicarbonate: 22.6 mmol/L (ref 20.0–28.0)
Bicarbonate: 19.9 mmol/L — ABNORMAL LOW (ref 20.0–28.0)
Bicarbonate: 20.8 mmol/L (ref 20.0–28.0)
Bicarbonate: 20.9 mmol/L (ref 20.0–28.0)
Calcium, Ion: 1.26 mmol/L (ref 1.15–1.40)
Calcium, Ion: 1.08 mmol/L — ABNORMAL LOW (ref 1.15–1.40)
Calcium, Ion: 1.09 mmol/L — ABNORMAL LOW (ref 1.15–1.40)
Calcium, Ion: 1.29 mmol/L (ref 1.15–1.40)
Calcium, Ion: 1.25 mmol/L (ref 1.15–1.40)
Calcium, Ion: 1.16 mmol/L (ref 1.15–1.40)
Calcium, Ion: 1.23 mmol/L (ref 1.15–1.40)
Calcium, Ion: 1.19 mmol/L (ref 1.15–1.40)
HCT: 35 % — ABNORMAL LOW (ref 39.0–52.0)
HCT: 24 % — ABNORMAL LOW (ref 39.0–52.0)
HCT: 20 % — ABNORMAL LOW (ref 39.0–52.0)
HCT: 30 % — ABNORMAL LOW (ref 39.0–52.0)
HCT: 33 % — ABNORMAL LOW (ref 39.0–52.0)
HCT: 27 % — ABNORMAL LOW (ref 39.0–52.0)
HCT: 31 % — ABNORMAL LOW (ref 39.0–52.0)
HCT: 31 % — ABNORMAL LOW (ref 39.0–52.0)
Hemoglobin: 11.9 g/dL — ABNORMAL LOW (ref 13.0–17.0)
Hemoglobin: 8.2 g/dL — ABNORMAL LOW (ref 13.0–17.0)
Hemoglobin: 6.8 g/dL — CL (ref 13.0–17.0)
Hemoglobin: 10.2 g/dL — ABNORMAL LOW (ref 13.0–17.0)
Hemoglobin: 11.2 g/dL — ABNORMAL LOW (ref 13.0–17.0)
Hemoglobin: 9.2 g/dL — ABNORMAL LOW (ref 13.0–17.0)
Hemoglobin: 10.5 g/dL — ABNORMAL LOW (ref 13.0–17.0)
Hemoglobin: 10.5 g/dL — ABNORMAL LOW (ref 13.0–17.0)
O2 Saturation: 100 %
O2 Saturation: 100 %
O2 Saturation: 100 %
O2 Saturation: 100 %
O2 Saturation: 100 %
O2 Saturation: 98 %
O2 Saturation: 95 %
O2 Saturation: 95 %
Patient temperature: 36.5
Patient temperature: 36.5
Patient temperature: 37.5
Patient temperature: 38
Potassium: 4.1 mmol/L (ref 3.5–5.1)
Potassium: 4.5 mmol/L (ref 3.5–5.1)
Potassium: 5.5 mmol/L — ABNORMAL HIGH (ref 3.5–5.1)
Potassium: 5.6 mmol/L — ABNORMAL HIGH (ref 3.5–5.1)
Potassium: 5.3 mmol/L — ABNORMAL HIGH (ref 3.5–5.1)
Potassium: 4.7 mmol/L (ref 3.5–5.1)
Potassium: 4.8 mmol/L (ref 3.5–5.1)
Potassium: 4.9 mmol/L (ref 3.5–5.1)
Sodium: 137 mmol/L (ref 135–145)
Sodium: 137 mmol/L (ref 135–145)
Sodium: 137 mmol/L (ref 135–145)
Sodium: 137 mmol/L (ref 135–145)
Sodium: 138 mmol/L (ref 135–145)
Sodium: 139 mmol/L (ref 135–145)
Sodium: 138 mmol/L (ref 135–145)
Sodium: 137 mmol/L (ref 135–145)
TCO2: 24 mmol/L (ref 22–32)
TCO2: 24 mmol/L (ref 22–32)
TCO2: 25 mmol/L (ref 22–32)
TCO2: 25 mmol/L (ref 22–32)
TCO2: 24 mmol/L (ref 22–32)
TCO2: 21 mmol/L — ABNORMAL LOW (ref 22–32)
TCO2: 22 mmol/L (ref 22–32)
TCO2: 22 mmol/L (ref 22–32)
pCO2 arterial: 47.2 mmHg (ref 32.0–48.0)
pCO2 arterial: 47.2 mmHg (ref 32.0–48.0)
pCO2 arterial: 38.1 mmHg (ref 32.0–48.0)
pCO2 arterial: 51.1 mmHg — ABNORMAL HIGH (ref 32.0–48.0)
pCO2 arterial: 50.5 mmHg — ABNORMAL HIGH (ref 32.0–48.0)
pCO2 arterial: 34.3 mmHg (ref 32.0–48.0)
pCO2 arterial: 45.9 mmHg (ref 32.0–48.0)
pCO2 arterial: 37.6 mmHg (ref 32.0–48.0)
pH, Arterial: 7.283 — ABNORMAL LOW (ref 7.350–7.450)
pH, Arterial: 7.292 — ABNORMAL LOW (ref 7.350–7.450)
pH, Arterial: 7.409 (ref 7.350–7.450)
pH, Arterial: 7.271 — ABNORMAL LOW (ref 7.350–7.450)
pH, Arterial: 7.257 — ABNORMAL LOW (ref 7.350–7.450)
pH, Arterial: 7.37 (ref 7.350–7.450)
pH, Arterial: 7.266 — ABNORMAL LOW (ref 7.350–7.450)
pH, Arterial: 7.357 (ref 7.350–7.450)
pO2, Arterial: 214 mmHg — ABNORMAL HIGH (ref 83.0–108.0)
pO2, Arterial: 293 mmHg — ABNORMAL HIGH (ref 83.0–108.0)
pO2, Arterial: 244 mmHg — ABNORMAL HIGH (ref 83.0–108.0)
pO2, Arterial: 332 mmHg — ABNORMAL HIGH (ref 83.0–108.0)
pO2, Arterial: 230 mmHg — ABNORMAL HIGH (ref 83.0–108.0)
pO2, Arterial: 99 mmHg (ref 83.0–108.0)
pO2, Arterial: 90 mmHg (ref 83.0–108.0)
pO2, Arterial: 83 mmHg (ref 83.0–108.0)

## 2021-01-20 LAB — CBC
HCT: 36.1 % — ABNORMAL LOW (ref 39.0–52.0)
HCT: 34.6 % — ABNORMAL LOW (ref 39.0–52.0)
HCT: 33.6 % — ABNORMAL LOW (ref 39.0–52.0)
Hemoglobin: 11.9 g/dL — ABNORMAL LOW (ref 13.0–17.0)
Hemoglobin: 11.3 g/dL — ABNORMAL LOW (ref 13.0–17.0)
Hemoglobin: 11.1 g/dL — ABNORMAL LOW (ref 13.0–17.0)
MCH: 29 pg (ref 26.0–34.0)
MCH: 29.4 pg (ref 26.0–34.0)
MCH: 29.3 pg (ref 26.0–34.0)
MCHC: 33 g/dL (ref 30.0–36.0)
MCHC: 32.7 g/dL (ref 30.0–36.0)
MCHC: 33 g/dL (ref 30.0–36.0)
MCV: 88 fL (ref 80.0–100.0)
MCV: 90.1 fL (ref 80.0–100.0)
MCV: 88.7 fL (ref 80.0–100.0)
Platelets: 159 10*3/uL (ref 150–400)
Platelets: 140 10*3/uL — ABNORMAL LOW (ref 150–400)
Platelets: 163 10*3/uL (ref 150–400)
RBC: 4.1 MIL/uL — ABNORMAL LOW (ref 4.22–5.81)
RBC: 3.84 MIL/uL — ABNORMAL LOW (ref 4.22–5.81)
RBC: 3.79 MIL/uL — ABNORMAL LOW (ref 4.22–5.81)
RDW: 13.7 % (ref 11.5–15.5)
RDW: 13.4 % (ref 11.5–15.5)
RDW: 13.3 % (ref 11.5–15.5)
WBC: 9.1 10*3/uL (ref 4.0–10.5)
WBC: 14.5 10*3/uL — ABNORMAL HIGH (ref 4.0–10.5)
WBC: 12.8 10*3/uL — ABNORMAL HIGH (ref 4.0–10.5)
nRBC: 0 % (ref 0.0–0.2)
nRBC: 0 % (ref 0.0–0.2)
nRBC: 0 % (ref 0.0–0.2)

## 2021-01-20 LAB — POCT I-STAT EG7
Acid-base deficit: 4 mmol/L — ABNORMAL HIGH (ref 0.0–2.0)
Bicarbonate: 22.8 mmol/L (ref 20.0–28.0)
Calcium, Ion: 1.11 mmol/L — ABNORMAL LOW (ref 1.15–1.40)
HCT: 25 % — ABNORMAL LOW (ref 39.0–52.0)
Hemoglobin: 8.5 g/dL — ABNORMAL LOW (ref 13.0–17.0)
O2 Saturation: 76 %
Potassium: 4.5 mmol/L (ref 3.5–5.1)
Sodium: 137 mmol/L (ref 135–145)
TCO2: 24 mmol/L (ref 22–32)
pCO2, Ven: 46.8 mmHg (ref 44.0–60.0)
pH, Ven: 7.295 (ref 7.250–7.430)
pO2, Ven: 45 mmHg (ref 32.0–45.0)

## 2021-01-20 LAB — GLUCOSE, CAPILLARY
Glucose-Capillary: 112 mg/dL — ABNORMAL HIGH (ref 70–99)
Glucose-Capillary: 150 mg/dL — ABNORMAL HIGH (ref 70–99)
Glucose-Capillary: 128 mg/dL — ABNORMAL HIGH (ref 70–99)
Glucose-Capillary: 159 mg/dL — ABNORMAL HIGH (ref 70–99)
Glucose-Capillary: 170 mg/dL — ABNORMAL HIGH (ref 70–99)
Glucose-Capillary: 154 mg/dL — ABNORMAL HIGH (ref 70–99)
Glucose-Capillary: 153 mg/dL — ABNORMAL HIGH (ref 70–99)
Glucose-Capillary: 185 mg/dL — ABNORMAL HIGH (ref 70–99)
Glucose-Capillary: 162 mg/dL — ABNORMAL HIGH (ref 70–99)
Glucose-Capillary: 162 mg/dL — ABNORMAL HIGH (ref 70–99)

## 2021-01-20 LAB — BASIC METABOLIC PANEL
Anion gap: 7 (ref 5–15)
Anion gap: 6 (ref 5–15)
BUN: 31 mg/dL — ABNORMAL HIGH (ref 8–23)
BUN: 30 mg/dL — ABNORMAL HIGH (ref 8–23)
CO2: 21 mmol/L — ABNORMAL LOW (ref 22–32)
CO2: 20 mmol/L — ABNORMAL LOW (ref 22–32)
Calcium: 8.4 mg/dL — ABNORMAL LOW (ref 8.9–10.3)
Calcium: 8 mg/dL — ABNORMAL LOW (ref 8.9–10.3)
Chloride: 106 mmol/L (ref 98–111)
Chloride: 108 mmol/L (ref 98–111)
Creatinine, Ser: 1.16 mg/dL (ref 0.61–1.24)
Creatinine, Ser: 1.34 mg/dL — ABNORMAL HIGH (ref 0.61–1.24)
GFR, Estimated: >60 mL/min (ref >60)
GFR, Estimated: 56 mL/min — ABNORMAL LOW (ref >60)
Glucose, Bld: 110 mg/dL — ABNORMAL HIGH (ref 70–99)
Glucose, Bld: 181 mg/dL — ABNORMAL HIGH (ref 70–99)
Potassium: 3.8 mmol/L (ref 3.5–5.1)
Potassium: 4.8 mmol/L (ref 3.5–5.1)
Sodium: 134 mmol/L — ABNORMAL LOW (ref 135–145)
Sodium: 134 mmol/L — ABNORMAL LOW (ref 135–145)

## 2021-01-20 LAB — PROTIME-INR
INR: 1.4 — ABNORMAL HIGH (ref 0.8–1.2)
Prothrombin Time: 17.1 seconds — ABNORMAL HIGH (ref 11.4–15.2)

## 2021-01-20 LAB — ECHO INTRAOPERATIVE TEE
Height: 75 in
Weight: 4088.21 oz

## 2021-01-20 LAB — PLATELET COUNT: Platelets: 125 10*3/uL — ABNORMAL LOW (ref 150–400)

## 2021-01-20 LAB — HEMOGLOBIN AND HEMATOCRIT, BLOOD
HCT: 22.9 % — ABNORMAL LOW (ref 39.0–52.0)
Hemoglobin: 7.7 g/dL — ABNORMAL LOW (ref 13.0–17.0)

## 2021-01-20 LAB — MAGNESIUM: Magnesium: 3.1 mg/dL — ABNORMAL HIGH (ref 1.7–2.4)

## 2021-01-20 LAB — HEPARIN LEVEL (UNFRACTIONATED): Heparin Unfractionated: 0.27 IU/mL — ABNORMAL LOW (ref 0.30–0.70)

## 2021-01-20 LAB — APTT: aPTT: 33 seconds (ref 24–36)

## 2021-01-20 LAB — PREPARE RBC (CROSSMATCH)

## 2021-01-20 SURGERY — CORONARY ARTERY BYPASS GRAFTING (CABG)
Anesthesia: General | Site: Chest

## 2021-01-20 MED ORDER — FAMOTIDINE 20 MG IN NS 100 ML IVPB
20.0000 mg | Freq: Two times a day (BID) | INTRAVENOUS | Status: AC
  Administered 2021-01-20: 20 mg via INTRAVENOUS
  Filled 2021-01-20: qty 100

## 2021-01-20 MED ORDER — LACTATED RINGERS IV SOLN: INTRAVENOUS | Status: DC

## 2021-01-20 MED ORDER — SODIUM CHLORIDE 0.9% FLUSH
10.0000 mL | Freq: Two times a day (BID) | INTRAVENOUS | Status: DC
  Administered 2021-01-21: 10 mL
  Administered 2021-01-21: 20 mL
  Administered 2021-01-22 – 2021-01-23 (×2): 10 mL
  Administered 2021-01-24: 20 mL
  Administered 2021-01-24 – 2021-01-26 (×4): 10 mL

## 2021-01-20 MED ORDER — LACTATED RINGERS IV SOLN: INTRAVENOUS | Status: DC | PRN

## 2021-01-20 MED ORDER — DOCUSATE SODIUM 100 MG PO CAPS
200.0000 mg | ORAL_CAPSULE | Freq: Every day | ORAL | Status: DC
  Administered 2021-01-21 – 2021-01-29 (×8): 200 mg via ORAL
  Filled 2021-01-20 (×10): qty 2

## 2021-01-20 MED ORDER — FENTANYL CITRATE (PF) 250 MCG/5ML IJ SOLN
INTRAMUSCULAR | Status: DC | PRN
  Administered 2021-01-20: 100 ug via INTRAVENOUS
  Administered 2021-01-20: 50 ug via INTRAVENOUS
  Administered 2021-01-20 (×2): 100 ug via INTRAVENOUS
  Administered 2021-01-20: 200 ug via INTRAVENOUS
  Administered 2021-01-20: 100 ug via INTRAVENOUS
  Administered 2021-01-20: 50 ug via INTRAVENOUS

## 2021-01-20 MED ORDER — SODIUM CHLORIDE (PF) 0.9 % IJ SOLN
OROMUCOSAL | Status: DC | PRN
  Administered 2021-01-20 (×5): 4 mL via TOPICAL

## 2021-01-20 MED ORDER — SODIUM CHLORIDE 0.9 % IV SOLN: INTRAVENOUS | Status: DC

## 2021-01-20 MED ORDER — PANTOPRAZOLE SODIUM 40 MG PO TBEC
40.0000 mg | DELAYED_RELEASE_TABLET | Freq: Every day | ORAL | Status: DC
  Administered 2021-01-22 – 2021-01-30 (×9): 40 mg via ORAL
  Filled 2021-01-20 (×9): qty 1

## 2021-01-20 MED ORDER — MORPHINE SULFATE (PF) 2 MG/ML IV SOLN
1.0000 mg | INTRAVENOUS | Status: DC | PRN
  Administered 2021-01-20: 2 mg via INTRAVENOUS
  Administered 2021-01-20: 1 mg via INTRAVENOUS
  Administered 2021-01-21: 4 mg via INTRAVENOUS
  Administered 2021-01-21 (×2): 2 mg via INTRAVENOUS
  Filled 2021-01-20 (×3): qty 1
  Filled 2021-01-20: qty 2
  Filled 2021-01-20: qty 1

## 2021-01-20 MED ORDER — EPHEDRINE SULFATE 50 MG/ML IJ SOLN
INTRAMUSCULAR | Status: DC | PRN
  Administered 2021-01-20: 10 mg via INTRAVENOUS
  Administered 2021-01-20 (×2): 5 mg via INTRAVENOUS

## 2021-01-20 MED ORDER — DEXMEDETOMIDINE HCL IN NACL 400 MCG/100ML IV SOLN
0.0000 ug/kg/h | INTRAVENOUS | Status: DC
  Administered 2021-01-20: 0.7 ug/kg/h via INTRAVENOUS
  Filled 2021-01-20: qty 100

## 2021-01-20 MED ORDER — PLASMA-LYTE A IV SOLN
INTRAVENOUS | Status: DC | PRN
  Administered 2021-01-20: 1000 mL via INTRAVASCULAR

## 2021-01-20 MED ORDER — SODIUM CHLORIDE 0.9 % IV SOLN
250.0000 mL | INTRAVENOUS | Status: DC
  Administered 2021-01-21: 250 mL via INTRAVENOUS

## 2021-01-20 MED ORDER — DEXTROSE 50 % IV SOLN: 0.0000 mL | INTRAVENOUS | Status: DC | PRN

## 2021-01-20 MED ORDER — ALBUMIN HUMAN 5 % IV SOLN
250.0000 mL | INTRAVENOUS | Status: DC | PRN
  Administered 2021-01-20 – 2021-01-21 (×2): 12.5 g via INTRAVENOUS
  Filled 2021-01-20: qty 250

## 2021-01-20 MED ORDER — METOPROLOL TARTRATE 25 MG/10 ML ORAL SUSPENSION: 12.5000 mg | Freq: Two times a day (BID) | ORAL | Status: DC

## 2021-01-20 MED ORDER — 0.9 % SODIUM CHLORIDE (POUR BTL) OPTIME
TOPICAL | Status: DC | PRN
  Administered 2021-01-20: 5000 mL

## 2021-01-20 MED ORDER — HEPARIN SODIUM (PORCINE) 1000 UNIT/ML IJ SOLN
INTRAMUSCULAR | Status: DC | PRN
  Administered 2021-01-20: 2000 [IU] via INTRAVENOUS
  Administered 2021-01-20: 38000 [IU] via INTRAVENOUS

## 2021-01-20 MED ORDER — HEMOSTATIC AGENTS (NO CHARGE) OPTIME
TOPICAL | Status: DC | PRN
  Administered 2021-01-20: 1 via TOPICAL

## 2021-01-20 MED ORDER — SODIUM CHLORIDE 0.9% FLUSH: 10.0000 mL | INTRAVENOUS | Status: DC | PRN

## 2021-01-20 MED ORDER — LIDOCAINE 2% (20 MG/ML) 5 ML SYRINGE
INTRAMUSCULAR | Status: DC | PRN
  Administered 2021-01-20: 60 mg via INTRAVENOUS

## 2021-01-20 MED ORDER — ROCURONIUM BROMIDE 10 MG/ML (PF) SYRINGE
PREFILLED_SYRINGE | INTRAVENOUS | Status: DC | PRN
  Administered 2021-01-20 (×2): 50 mg via INTRAVENOUS
  Administered 2021-01-20: 100 mg via INTRAVENOUS
  Administered 2021-01-20: 50 mg via INTRAVENOUS

## 2021-01-20 MED ORDER — ASPIRIN 81 MG PO CHEW
324.0000 mg | CHEWABLE_TABLET | Freq: Every day | ORAL | Status: DC
  Administered 2021-01-29: 324 mg
  Filled 2021-01-20: qty 4

## 2021-01-20 MED ORDER — POTASSIUM CHLORIDE 10 MEQ/50ML IV SOLN: 10.0000 meq | INTRAVENOUS | Status: AC

## 2021-01-20 MED ORDER — ACETAMINOPHEN 160 MG/5ML PO SOLN
1000.0000 mg | Freq: Four times a day (QID) | ORAL | Status: AC
  Administered 2021-01-20: 1000 mg
  Filled 2021-01-20: qty 40.6

## 2021-01-20 MED ORDER — CHLORHEXIDINE GLUCONATE 0.12 % MT SOLN
15.0000 mL | OROMUCOSAL | Status: AC
  Administered 2021-01-20: 15 mL via OROMUCOSAL

## 2021-01-20 MED ORDER — MAGNESIUM SULFATE 4 GM/100ML IV SOLN
4.0000 g | Freq: Once | INTRAVENOUS | Status: AC
  Administered 2021-01-20: 4 g via INTRAVENOUS
  Filled 2021-01-20: qty 100

## 2021-01-20 MED ORDER — NOREPINEPHRINE 4 MG/250ML-% IV SOLN
0.0000 ug/min | INTRAVENOUS | Status: DC
  Administered 2021-01-20: 8 ug/min via INTRAVENOUS
  Filled 2021-01-20: qty 250

## 2021-01-20 MED ORDER — ASPIRIN EC 325 MG PO TBEC
325.0000 mg | DELAYED_RELEASE_TABLET | Freq: Every day | ORAL | Status: DC
  Administered 2021-01-21 – 2021-01-30 (×9): 325 mg via ORAL
  Filled 2021-01-20 (×10): qty 1

## 2021-01-20 MED ORDER — PROPOFOL 10 MG/ML IV BOLUS
INTRAVENOUS | Status: DC | PRN
  Administered 2021-01-20: 50 mg via INTRAVENOUS

## 2021-01-20 MED ORDER — NOREPINEPHRINE 16 MG/250ML-% IV SOLN
0.0000 ug/min | INTRAVENOUS | Status: DC
  Administered 2021-01-20: 2 ug/min via INTRAVENOUS
  Filled 2021-01-20: qty 250

## 2021-01-20 MED ORDER — PHENYLEPHRINE HCL-NACL 20-0.9 MG/250ML-% IV SOLN: 0.0000 ug/min | INTRAVENOUS | Status: DC

## 2021-01-20 MED ORDER — PROPOFOL 10 MG/ML IV BOLUS
INTRAVENOUS | Status: AC
  Filled 2021-01-20: qty 20

## 2021-01-20 MED ORDER — VANCOMYCIN HCL IN DEXTROSE 1-5 GM/200ML-% IV SOLN
1000.0000 mg | Freq: Once | INTRAVENOUS | Status: AC
  Administered 2021-01-20: 1000 mg via INTRAVENOUS
  Filled 2021-01-20: qty 200

## 2021-01-20 MED ORDER — ACETAMINOPHEN 500 MG PO TABS
1000.0000 mg | ORAL_TABLET | Freq: Four times a day (QID) | ORAL | Status: AC
  Administered 2021-01-21 – 2021-01-25 (×18): 1000 mg via ORAL
  Filled 2021-01-20 (×18): qty 2

## 2021-01-20 MED ORDER — ACETAMINOPHEN 650 MG RE SUPP
650.0000 mg | Freq: Once | RECTAL | Status: AC
  Administered 2021-01-20: 650 mg via RECTAL

## 2021-01-20 MED ORDER — SODIUM BICARBONATE 8.4 % IV SOLN
50.0000 meq | Freq: Once | INTRAVENOUS | Status: AC
  Administered 2021-01-20: 50 meq via INTRAVENOUS

## 2021-01-20 MED ORDER — FENTANYL CITRATE (PF) 250 MCG/5ML IJ SOLN
INTRAMUSCULAR | Status: AC
  Filled 2021-01-20: qty 20

## 2021-01-20 MED ORDER — TRANEXAMIC ACID 1000 MG/10ML IV SOLN
1.5000 mg/kg/h | INTRAVENOUS | Status: DC
  Filled 2021-01-20: qty 25

## 2021-01-20 MED ORDER — BISACODYL 5 MG PO TBEC
10.0000 mg | DELAYED_RELEASE_TABLET | Freq: Every day | ORAL | Status: DC
  Administered 2021-01-21 – 2021-01-29 (×8): 10 mg via ORAL
  Filled 2021-01-20 (×10): qty 2

## 2021-01-20 MED ORDER — MIDAZOLAM HCL (PF) 5 MG/ML IJ SOLN
INTRAMUSCULAR | Status: DC | PRN
  Administered 2021-01-20: 1 mg via INTRAVENOUS
  Administered 2021-01-20 (×4): 2 mg via INTRAVENOUS
  Administered 2021-01-20: 1 mg via INTRAVENOUS

## 2021-01-20 MED ORDER — METOPROLOL TARTRATE 5 MG/5ML IV SOLN
2.5000 mg | INTRAVENOUS | Status: DC | PRN
  Administered 2021-01-22 (×2): 5 mg via INTRAVENOUS
  Filled 2021-01-20 (×2): qty 5

## 2021-01-20 MED ORDER — ALBUTEROL SULFATE HFA 108 (90 BASE) MCG/ACT IN AERS
INHALATION_SPRAY | RESPIRATORY_TRACT | Status: DC | PRN
  Administered 2021-01-20: 2 via RESPIRATORY_TRACT

## 2021-01-20 MED ORDER — PROTAMINE SULFATE 10 MG/ML IV SOLN
INTRAVENOUS | Status: DC | PRN
  Administered 2021-01-20: 330 mg via INTRAVENOUS
  Administered 2021-01-20: 20 mg via INTRAVENOUS
  Administered 2021-01-20: 25 mg via INTRAVENOUS

## 2021-01-20 MED ORDER — CHLORHEXIDINE GLUCONATE CLOTH 2 % EX PADS
6.0000 | MEDICATED_PAD | Freq: Every day | CUTANEOUS | Status: DC
  Administered 2021-01-20 – 2021-01-27 (×7): 6 via TOPICAL

## 2021-01-20 MED ORDER — SODIUM CHLORIDE (PF) 0.9 % IJ SOLN
INTRAMUSCULAR | Status: AC
  Filled 2021-01-20: qty 10

## 2021-01-20 MED ORDER — SODIUM CHLORIDE 0.9% FLUSH
3.0000 mL | Freq: Two times a day (BID) | INTRAVENOUS | Status: DC
  Administered 2021-01-21 – 2021-01-26 (×9): 3 mL via INTRAVENOUS

## 2021-01-20 MED ORDER — MIDAZOLAM HCL (PF) 10 MG/2ML IJ SOLN
INTRAMUSCULAR | Status: AC
  Filled 2021-01-20: qty 2

## 2021-01-20 MED ORDER — SODIUM CHLORIDE 0.9% FLUSH: 3.0000 mL | INTRAVENOUS | Status: DC | PRN

## 2021-01-20 MED ORDER — OXYCODONE HCL 5 MG PO TABS
5.0000 mg | ORAL_TABLET | ORAL | Status: DC | PRN
  Administered 2021-01-20 – 2021-01-24 (×8): 5 mg via ORAL
  Filled 2021-01-20 (×9): qty 1

## 2021-01-20 MED ORDER — NITROGLYCERIN IN D5W 200-5 MCG/ML-% IV SOLN: 0.0000 ug/min | INTRAVENOUS | Status: DC

## 2021-01-20 MED ORDER — ONDANSETRON HCL 4 MG/2ML IJ SOLN
4.0000 mg | Freq: Four times a day (QID) | INTRAMUSCULAR | Status: DC | PRN
  Administered 2021-01-20 – 2021-01-21 (×2): 4 mg via INTRAVENOUS
  Filled 2021-01-20 (×2): qty 2

## 2021-01-20 MED ORDER — CEFAZOLIN SODIUM-DEXTROSE 2-4 GM/100ML-% IV SOLN
2.0000 g | Freq: Three times a day (TID) | INTRAVENOUS | Status: AC
  Administered 2021-01-20 – 2021-01-22 (×6): 2 g via INTRAVENOUS
  Filled 2021-01-20 (×6): qty 100

## 2021-01-20 MED ORDER — TRAMADOL HCL 50 MG PO TABS
50.0000 mg | ORAL_TABLET | Freq: Four times a day (QID) | ORAL | Status: DC | PRN
  Administered 2021-01-21: 50 mg via ORAL
  Filled 2021-01-20: qty 1

## 2021-01-20 MED ORDER — PHENYLEPHRINE 40 MCG/ML (10ML) SYRINGE FOR IV PUSH (FOR BLOOD PRESSURE SUPPORT)
PREFILLED_SYRINGE | INTRAVENOUS | Status: DC | PRN
  Administered 2021-01-20 (×3): 80 ug via INTRAVENOUS

## 2021-01-20 MED ORDER — METOPROLOL TARTRATE 12.5 MG HALF TABLET
12.5000 mg | ORAL_TABLET | Freq: Two times a day (BID) | ORAL | Status: DC
  Filled 2021-01-20: qty 1

## 2021-01-20 MED ORDER — PRIMIDONE 50 MG PO TABS
50.0000 mg | ORAL_TABLET | Freq: Every day | ORAL | Status: DC
  Administered 2021-01-21 – 2021-01-29 (×9): 50 mg via ORAL
  Filled 2021-01-20 (×11): qty 1

## 2021-01-20 MED ORDER — MILRINONE LACTATE IN DEXTROSE 20-5 MG/100ML-% IV SOLN
0.1250 ug/kg/min | INTRAVENOUS | Status: DC
  Administered 2021-01-20 – 2021-01-21 (×2): 0.25 ug/kg/min via INTRAVENOUS
  Administered 2021-01-22 – 2021-01-23 (×2): 0.125 ug/kg/min via INTRAVENOUS
  Filled 2021-01-20 (×5): qty 100

## 2021-01-20 MED ORDER — MIDAZOLAM HCL 2 MG/2ML IJ SOLN: 2.0000 mg | INTRAMUSCULAR | Status: DC | PRN

## 2021-01-20 MED ORDER — SODIUM CHLORIDE 0.45 % IV SOLN: INTRAVENOUS | Status: DC | PRN

## 2021-01-20 MED ORDER — BISACODYL 10 MG RE SUPP: 10.0000 mg | Freq: Every day | RECTAL | Status: DC

## 2021-01-20 MED ORDER — INSULIN REGULAR(HUMAN) IN NACL 100-0.9 UT/100ML-% IV SOLN
INTRAVENOUS | Status: DC
Start: 2021-01-20 — End: 2021-01-22

## 2021-01-20 MED ORDER — ACETAMINOPHEN 160 MG/5ML PO SOLN: 650.0000 mg | Freq: Once | ORAL | Status: AC

## 2021-01-20 MED ORDER — LACTATED RINGERS IV SOLN: 500.0000 mL | Freq: Once | INTRAVENOUS | Status: DC | PRN

## 2021-01-20 SURGICAL SUPPLY — 91 items
ADAPTER CARDIO PERF ANTE/RETRO (ADAPTER) ×1 IMPLANT
ADH SKN CLS APL DERMABOND .7 (GAUZE/BANDAGES/DRESSINGS) ×4
ADPR PRFSN 84XANTGRD RTRGD (ADAPTER) ×4
BAG DECANTER FOR FLEXI CONT (MISCELLANEOUS) ×5 IMPLANT
BLADE CLIPPER SURG (BLADE) ×5 IMPLANT
BLADE STERNUM SYSTEM 6 (BLADE) ×5 IMPLANT
BNDG ELASTIC 4X5.8 VLCR STR LF (GAUZE/BANDAGES/DRESSINGS) ×5 IMPLANT
BNDG ELASTIC 6X5.8 VLCR STR LF (GAUZE/BANDAGES/DRESSINGS) ×5 IMPLANT
BNDG GAUZE ELAST 4 BULKY (GAUZE/BANDAGES/DRESSINGS) ×5 IMPLANT
CANISTER SUCT 3000ML PPV (MISCELLANEOUS) ×5 IMPLANT
CANNULA EZ GLIDE 8.0 24FR (CANNULA) ×1 IMPLANT
CANNULA EZ GLIDE AORTIC 21FR (CANNULA) ×4 IMPLANT
CANNULA GUNDRY RCSP 15FR (MISCELLANEOUS) ×1 IMPLANT
CATH CPB KIT HENDRICKSON (MISCELLANEOUS) ×5 IMPLANT
CATH ROBINSON RED A/P 18FR (CATHETERS) ×5 IMPLANT
CATH THORACIC 36FR (CATHETERS) ×5 IMPLANT
CATH THORACIC 36FR RT ANG (CATHETERS) ×5 IMPLANT
CLIP VESOCCLUDE MED 24/CT (CLIP) IMPLANT
CLIP VESOCCLUDE SM WIDE 24/CT (CLIP) ×2 IMPLANT
CONTAINER PROTECT SURGISLUSH (MISCELLANEOUS) ×5 IMPLANT
DERMABOND ADVANCED (GAUZE/BANDAGES/DRESSINGS) ×1
DERMABOND ADVANCED .7 DNX12 (GAUZE/BANDAGES/DRESSINGS) IMPLANT
DRAPE CARDIOVASCULAR INCISE (DRAPES) ×5
DRAPE SRG 135X102X78XABS (DRAPES) ×4 IMPLANT
DRAPE WARM FLUID 44X44 (DRAPES) ×5 IMPLANT
DRSG COVADERM 4X14 (GAUZE/BANDAGES/DRESSINGS) ×5 IMPLANT
ELECT REM PT RETURN 9FT ADLT (ELECTROSURGICAL) ×10
ELECTRODE REM PT RTRN 9FT ADLT (ELECTROSURGICAL) ×8 IMPLANT
FELT TEFLON 1X6 (MISCELLANEOUS) ×9 IMPLANT
GAUZE 4X4 16PLY ~~LOC~~+RFID DBL (SPONGE) ×1 IMPLANT
GAUZE SPONGE 4X4 12PLY STRL (GAUZE/BANDAGES/DRESSINGS) ×10 IMPLANT
GAUZE SPONGE 4X4 12PLY STRL LF (GAUZE/BANDAGES/DRESSINGS) ×2 IMPLANT
GLOVE SURG MICRO LTX SZ6.5 (GLOVE) ×2 IMPLANT
GLOVE SURG SIGNA 7.5 PF LTX (GLOVE) ×15 IMPLANT
GLOVE SURG UNDER POLY LF SZ6.5 (GLOVE) ×1 IMPLANT
GOWN STRL REUS W/ TWL LRG LVL3 (GOWN DISPOSABLE) ×16 IMPLANT
GOWN STRL REUS W/ TWL XL LVL3 (GOWN DISPOSABLE) ×8 IMPLANT
GOWN STRL REUS W/TWL LRG LVL3 (GOWN DISPOSABLE) ×40
GOWN STRL REUS W/TWL XL LVL3 (GOWN DISPOSABLE) ×10
HEMOSTAT POWDER SURGIFOAM 1G (HEMOSTASIS) ×15 IMPLANT
HEMOSTAT SURGICEL 2X14 (HEMOSTASIS) ×5 IMPLANT
INSERT FOGARTY XLG (MISCELLANEOUS) IMPLANT
KIT BASIN OR (CUSTOM PROCEDURE TRAY) ×5 IMPLANT
KIT SUCTION CATH 14FR (SUCTIONS) ×10 IMPLANT
KIT TURNOVER KIT B (KITS) ×5 IMPLANT
KIT VASOVIEW HEMOPRO 2 VH 4000 (KITS) ×5 IMPLANT
MARKER GRAFT CORONARY BYPASS (MISCELLANEOUS) ×15 IMPLANT
NS IRRIG 1000ML POUR BTL (IV SOLUTION) ×25 IMPLANT
PACK E OPEN HEART (SUTURE) ×5 IMPLANT
PACK OPEN HEART (CUSTOM PROCEDURE TRAY) ×5 IMPLANT
PAD ARMBOARD 7.5X6 YLW CONV (MISCELLANEOUS) ×10 IMPLANT
PAD ELECT DEFIB RADIOL ZOLL (MISCELLANEOUS) ×5 IMPLANT
PENCIL BUTTON HOLSTER BLD 10FT (ELECTRODE) ×5 IMPLANT
POSITIONER HEAD DONUT 9IN (MISCELLANEOUS) ×5 IMPLANT
PUNCH AORTIC ROTATE 4.0MM (MISCELLANEOUS) IMPLANT
PUNCH AORTIC ROTATE 4.5MM 8IN (MISCELLANEOUS) ×1 IMPLANT
PUNCH AORTIC ROTATE 5MM 8IN (MISCELLANEOUS) IMPLANT
SET MPS 3-ND DEL (MISCELLANEOUS) ×1 IMPLANT
SPONGE T-LAP 18X18 ~~LOC~~+RFID (SPONGE) ×5 IMPLANT
SPONGE T-LAP 4X18 ~~LOC~~+RFID (SPONGE) ×2 IMPLANT
SUPPORT HEART JANKE-BARRON (MISCELLANEOUS) ×5 IMPLANT
SUT BONE WAX W31G (SUTURE) ×5 IMPLANT
SUT MNCRL AB 4-0 PS2 18 (SUTURE) IMPLANT
SUT PROLENE 3 0 SH DA (SUTURE) ×5 IMPLANT
SUT PROLENE 4 0 RB 1 (SUTURE) ×20
SUT PROLENE 4 0 SH DA (SUTURE) IMPLANT
SUT PROLENE 4-0 RB1 .5 CRCL 36 (SUTURE) IMPLANT
SUT PROLENE 6 0 C 1 30 (SUTURE) ×11 IMPLANT
SUT PROLENE 7 0 BV1 MDA (SUTURE) ×5 IMPLANT
SUT PROLENE 8 0 BV175 6 (SUTURE) ×1 IMPLANT
SUT STEEL 6MS V (SUTURE) ×5 IMPLANT
SUT STEEL STERNAL CCS#1 18IN (SUTURE) IMPLANT
SUT STEEL SZ 6 DBL 3X14 BALL (SUTURE) ×5 IMPLANT
SUT VIC AB 1 CTX 36 (SUTURE) ×10
SUT VIC AB 1 CTX36XBRD ANBCTR (SUTURE) ×8 IMPLANT
SUT VIC AB 2-0 CT1 27 (SUTURE)
SUT VIC AB 2-0 CT1 TAPERPNT 27 (SUTURE) IMPLANT
SUT VIC AB 2-0 CTX 27 (SUTURE) IMPLANT
SUT VIC AB 3-0 SH 27 (SUTURE)
SUT VIC AB 3-0 SH 27X BRD (SUTURE) IMPLANT
SUT VIC AB 3-0 X1 27 (SUTURE) IMPLANT
SUT VICRYL 4-0 PS2 18IN ABS (SUTURE) IMPLANT
SYSTEM SAHARA CHEST DRAIN ATS (WOUND CARE) ×5 IMPLANT
TAPE CLOTH SURG 4X10 WHT LF (GAUZE/BANDAGES/DRESSINGS) ×2 IMPLANT
TAPE PAPER 2X10 WHT MICROPORE (GAUZE/BANDAGES/DRESSINGS) ×1 IMPLANT
TOWEL GREEN STERILE (TOWEL DISPOSABLE) ×5 IMPLANT
TOWEL GREEN STERILE FF (TOWEL DISPOSABLE) ×5 IMPLANT
TRAY FOLEY SLVR 16FR TEMP STAT (SET/KITS/TRAYS/PACK) ×5 IMPLANT
TUBING LAP HI FLOW INSUFFLATIO (TUBING) ×5 IMPLANT
UNDERPAD 30X36 HEAVY ABSORB (UNDERPADS AND DIAPERS) ×5 IMPLANT
WATER STERILE IRR 1000ML POUR (IV SOLUTION) ×10 IMPLANT

## 2021-01-20 NOTE — Anesthesia Preprocedure Evaluation (Addendum)
Anesthesia Evaluation  Patient identified by MRN, date of birth, ID band Patient awake    Reviewed: Allergy & Precautions, NPO status , Patient's Chart, lab work & pertinent test results  Airway Mallampati: III  TM Distance: >3 FB Neck ROM: Full    Dental no notable dental hx.    Pulmonary former smoker,    Pulmonary exam normal breath sounds clear to auscultation       Cardiovascular hypertension, Pt. on medications + CAD, + Past MI and +CHF  Normal cardiovascular exam Rhythm:Regular Rate:Normal  ECHO: Left ventricular ejection fraction, by estimation, is 40 to 45%. The left ventricle has mildly decreased function. The left ventricle demonstrates regional wall motion abnormalities (see scoring diagram/findings for description). There is severe asymmetric left ventricular hypertrophy of the basal-septal segment. Left ventricular diastolic parameters are consistent with Grade II diastolic dysfunction (pseudonormalization). Elevated left atrial pressure. Right ventricular systolic function is normal. The right ventricular size is normal. There is mildly elevated pulmonary artery systolic pressure. The mitral valve is normal in structure. Trivial mitral valve regurgitation. The aortic valve was not well visualized. Aortic valve regurgitation is not visualized. No aortic stenosis is present. Aortic dilatation noted. There is dilatation of the ascending aorta, measuring 43 mm. There is dilatation of the aortic root, measuring 40 mm.   Neuro/Psych Tremor, essential negative psych ROS   GI/Hepatic negative GI ROS, Neg liver ROS,   Endo/Other  negative endocrine ROS  Renal/GU negative Renal ROS     Musculoskeletal negative musculoskeletal ROS (+)   Abdominal (+) + obese,   Peds  Hematology  (+) anemia , HLD   Anesthesia Other Findings CAD  Reproductive/Obstetrics                             Anesthesia Physical Anesthesia Plan  ASA: 4  Anesthesia Plan: General   Post-op Pain Management:    Induction: Intravenous  PONV Risk Score and Plan: 2 and Ondansetron, Dexamethasone, Midazolam and Treatment may vary due to age or medical condition  Airway Management Planned: Oral ETT  Additional Equipment: Arterial line, CVP, PA Cath, TEE and Ultrasound Guidance Line Placement  Intra-op Plan:   Post-operative Plan: Post-operative intubation/ventilation  Informed Consent: I have reviewed the patients History and Physical, chart, labs and discussed the procedure including the risks, benefits and alternatives for the proposed anesthesia with the patient or authorized representative who has indicated his/her understanding and acceptance.     Dental advisory given  Plan Discussed with: CRNA  Anesthesia Plan Comments: (IABP 1:1)       Anesthesia Quick Evaluation

## 2021-01-20 NOTE — Progress Notes (Addendum)
[address]       [place] [address]             [phone]      Day of Surgery  Procedure(s) (LRB): CORONARY ARTERY BYPASS GRAFTING (CABG) X 3 ON PUMP, USING LEFT INTERNAL MAMMARY ARTERY AND LEFT ENDOSCOPIC GREATER SAPHENOUS VEIN HARVEST CONDUITS (N/A) TRANSESOPHAGEAL ECHOCARDIOGRAM (TEE) (N/A) APPLICATION OF CELL SAVER ENDOVEIN HARVEST OF GREATER SAPHENOUS VEIN (Left)   Total Length of Stay:  LOS: 4 days    SUBJECTIVE:  Vitals:   01/20/21 1600 01/20/21 1615  BP: 111/65   Pulse: (!) 221 (!) 242  Resp: 20 17  Temp: 97.7 F (36.5 C) 98.1 F (36.7 C)  SpO2: 98% 99%    Intake/Output      09/08 0701 09/09 0700 09/09 0701 09/10 0700   P.O. 440    I.V. (mL/kg) 2042 (17.6) 2165.3 (18.7)   Blood  640   IV Piggyback  636.4   Total Intake(mL/kg) 2482 (21.4) 3441.8 (29.7)   Urine (mL/kg/hr) 1425 (0.5) 1190 (1.1)   Stool 1    Blood  1164   Chest Tube  102   Total Output 1426 2456   Net +1056 +985.8        Urine Occurrence 1 x    Stool Occurrence 1 x        sodium chloride     [START ON 01/21/2021] sodium chloride     sodium chloride     albumin human      ceFAZolin (ANCEF) IV     dexmedetomidine (PRECEDEX) IV infusion 0.3 mcg/kg/hr (01/20/21 1600)   insulin 0.7 Units/hr (01/20/21 1600)   lactated ringers     lactated ringers 20 mL/hr at 01/20/21 1600   lactated ringers     magnesium sulfate 20 mL/hr at 01/20/21 1600   milrinone 0.25 mcg/kg/min (01/20/21 1600)   nitroGLYCERIN Stopped (01/20/21 1414)   norepinephrine (LEVOPHED) Adult infusion 9 mcg/min (01/20/21 1600)   phenylephrine (NEO-SYNEPHRINE) Adult infusion Stopped (01/20/21 1415)   potassium chloride     vancomycin      CBC    Component Value Date/Time   WBC 14.5 (H) 01/20/2021 1400   RBC 3.84 (L) 01/20/2021 1400   HGB 9.2 (L) 01/20/2021 1504   HCT 27.0 (L) 01/20/2021 1504   PLT 140 (L) 01/20/2021 1400   MCV 90.1 01/20/2021 1400   MCH 29.4 01/20/2021 1400   MCHC  32.7 01/20/2021 1400   RDW 13.4 01/20/2021 1400   CMP     Component Value Date/Time   NA 139 01/20/2021 1504   K 4.7 01/20/2021 1504   CL 106 01/20/2021 1235   CO2 21 (L) 01/20/2021 0229   GLUCOSE 164 (H) 01/20/2021 1235   BUN 31 (H) 01/20/2021 1235   CREATININE 1.10 01/20/2021 1235   CREATININE 1.17 12/24/2016 1015   CALCIUM 8.4 (L) 01/20/2021 0229   PROT 6.4 (L) 01/16/2021 0739   ALBUMIN 3.7 01/16/2021 0739   AST 570 (H) 01/16/2021 0739   ALT 86 (H) 01/16/2021 0739   ALKPHOS 133 (H) 01/16/2021 0739   BILITOT 0.5 01/16/2021 0739   GFRNONAA >60 01/20/2021 0229   ABG    Component Value Date/Time   PHART 7.370 01/20/2021 1504   PCO2ART 34.3 01/20/2021 1504   PO2ART 99 01/20/2021 1504   HCO3 19.9 (L) 01/20/2021 1504   TCO2 21 (L) 01/20/2021 1504   ACIDBASEDEF 5.0 (H) 01/20/2021 1504   O2SAT 98.0 01/20/2021 1504  CBG (last 3)  Recent Labs    01/20/21 0656 01/20/21 1358 01/20/21 1501  GLUCAP 112* 150* 128*     ASSESSMENT:  Early post operative, CABG x 3.. IABP in place at 1:1 Post Op EKG shows NSR with RBBB  CT output low 80 cc since surgery  Currently sedated on vent, requiring Levophed at 9, Milrinone at 0.25, good U/O  Erin Barrett, PA-C 01/20/21 4:23 PM  CXR ok, ECG shows persistent changes in V1-3. Clinical course has been smooth with good CO, nothing to suggest ischemia.  Salvatore Decent Dorris Fetch, MD [facility] [phone]

## 2021-01-20 NOTE — Transfer of Care (Signed)
Immediate Anesthesia Transfer of Care Note  Patient: Timothy Munoz  Procedure(s) Performed: CORONARY ARTERY BYPASS GRAFTING (CABG) X 3 ON PUMP, USING LEFT INTERNAL MAMMARY ARTERY AND LEFT ENDOSCOPIC GREATER SAPHENOUS VEIN HARVEST CONDUITS (Chest) TRANSESOPHAGEAL ECHOCARDIOGRAM (TEE) APPLICATION OF CELL SAVER ENDOVEIN HARVEST OF GREATER SAPHENOUS VEIN (Left)  Patient Location: ICU  Anesthesia Type:General  Level of Consciousness: Patient remains intubated per anesthesia plan  Airway & Oxygen Therapy: Patient remains intubated per anesthesia plan and Patient placed on Ventilator (see vital sign flow sheet for setting)  Post-op Assessment: Report given to RN and Post -op Vital signs reviewed and stable  Post vital signs: Reviewed and stable  Last Vitals:  Vitals Value Taken Time  BP 136/90 01/20/21 1353  Temp 36.5 C 01/20/21 1358  Pulse 88 01/20/21 1358  Resp 11 01/20/21 1358  SpO2 84 % 01/20/21 1358  Vitals shown include unvalidated device data.  Last Pain:  Vitals:   01/20/21 0655  TempSrc: Oral  PainSc:       Patients Stated Pain Goal: 0 (01/18/21 2155)  Complications: No notable events documented.

## 2021-01-20 NOTE — OR Nursing (Signed)
Pt has DP and PT pulse per doppler on right foot.

## 2021-01-20 NOTE — Anesthesia Procedure Notes (Signed)
Arterial Line Insertion Start/End9/01/2021 7:45 AM, 01/20/2021 7:50 AM Performed by: Colon Flattery, CRNA, CRNA  Patient location: OR. Preanesthetic checklist: patient identified, IV checked, site marked, risks and benefits discussed, surgical consent, monitors and equipment checked, pre-op evaluation, timeout performed and anesthesia consent Lidocaine 1% used for infiltration and patient sedated Right, radial was placed Catheter size: 20 G Hand hygiene performed  and maximum sterile barriers used   Attempts: 1 Procedure performed without using ultrasound guided technique. Following insertion, dressing applied and Biopatch. Patient tolerated the procedure well with no immediate complications.

## 2021-01-20 NOTE — Anesthesia Procedure Notes (Signed)
Central Venous Catheter Insertion Performed by: Murvin Natal, MD, anesthesiologist Start/End9/01/2021 8:00 AM, 01/20/2021 8:15 AM Patient location: OR. Preanesthetic checklist: patient identified, IV checked, site marked, risks and benefits discussed, surgical consent, monitors and equipment checked, pre-op evaluation, timeout performed and anesthesia consent Position: Trendelenburg Patient sedated Hand hygiene performed , maximum sterile barriers used  and Seldinger technique used Catheter size: 9 Fr Total catheter length 12. PA cath was placed.MAC introducer Swan type:thermodilution PA Cath depth:57 Procedure performed using ultrasound guided technique. Ultrasound Notes:anatomy identified, needle tip was noted to be adjacent to the nerve/plexus identified and no ultrasound evidence of intravascular and/or intraneural injection Attempts: 1 Following insertion, line sutured, dressing applied and Biopatch. Post procedure assessment: blood return through all ports, free fluid flow and no air  Patient tolerated the procedure well with no immediate complications.

## 2021-01-20 NOTE — OR Nursing (Signed)
Pt to OR 14 with IABP in place in right groin.  Pt has DP pulse audible per doppler on right foot. Right foot is cool to touch blanching capillary nailbeds on toes present.

## 2021-01-20 NOTE — Discharge Instructions (Signed)

## 2021-01-20 NOTE — Progress Notes (Signed)
Patient ID: Timothy Munoz, male   DOB: 1945-08-22, 75 y.o.   MRN: [id number]  TCTS Evening Rounds:   Hemodynamically stable  CI = 2 on milrinone 0.25, NE IABP 1:1  Extubated  Urine output good  CT output low  CBC    Component Value Date/Time   WBC 12.8 (H) 01/20/2021 1959   RBC 3.79 (L) 01/20/2021 1959   HGB 10.5 (L) 01/20/2021 2002   HCT 31.0 (L) 01/20/2021 2002   PLT 163 01/20/2021 1959   MCV 88.7 01/20/2021 1959   MCH 29.3 01/20/2021 1959   MCHC 33.0 01/20/2021 1959   RDW 13.3 01/20/2021 1959     BMET    Component Value Date/Time   NA 137 01/20/2021 2002   K 4.9 01/20/2021 2002   CL 106 01/20/2021 1235   CO2 21 (L) 01/20/2021 0229   GLUCOSE 164 (H) 01/20/2021 1235   BUN 31 (H) 01/20/2021 1235   CREATININE 1.10 01/20/2021 1235   CREATININE 1.17 12/24/2016 1015   CALCIUM 8.4 (L) 01/20/2021 0229   GFRNONAA >60 01/20/2021 0229     A/P:  Stable postop course. Continue current plans

## 2021-01-20 NOTE — Brief Op Note (Addendum)
01/16/2021 - 01/20/2021  9:32 AM  PATIENT:  Raynaldo Opitz  74 y.o. male  PRE-OPERATIVE DIAGNOSIS:  1. S/p STEMI 2. Coronary artery disease  POST-OPERATIVE DIAGNOSIS:  1. S/p STEMI 2. Coronary artery disease  PROCEDURE:  TRANSESOPHAGEAL ECHOCARDIOGRAM (TEE), CORONARY ARTERY BYPASS GRAFTING (CABG) x 3 (LIMA to LAD, SVG to OM, SVG to RAMUS INTERMEDIATE) with ENDOVEIN HARVEST OF LEFT THIGH GREATER SAPHENOUS VEIN, and APPLICATION OF CELL SAVER FINDINGS: Diagonal too small to graft  LEFT EVH HARVEST TIME: 15 minutes;LEFT EVH PREP TIME: 8 minutes  SURGEON:  Surgeon(s) and Role:    [name], MD - Primary  PHYSICIAN ASSISTANT: Doree Fudge PA-C  ASSISTANTS: Benay Spice RNFA   ANESTHESIA:   general  EBL: Per anesthesia, perfusion record   DRAINS:  Chest tubes placed in the mediastinal and pleural spaces    COUNTS CORRECT:  YES  DICTATION: .Dragon Dictation  PLAN OF CARE: Admit to inpatient   PATIENT DISPOSITION:  ICU - intubated and hemodynamically stable.   Delay start of Pharmacological VTE agent (>24hrs) due to surgical blood loss or risk of bleeding: yes  BASELINE WEIGHT: 115.9 kg  Good targets and good conduits

## 2021-01-20 NOTE — Progress Notes (Signed)
  Echocardiogram Echocardiogram Transesophageal has been performed.  Delcie Roch 01/20/2021, 10:07 AM

## 2021-01-20 NOTE — Interval H&P Note (Signed)
History and Physical Interval Note:  01/20/2021 7:09 AM  Timothy Munoz  has presented today for surgery, with the diagnosis of CAD.  The various methods of treatment have been discussed with the patient and family. After consideration of risks, benefits and other options for treatment, the patient has consented to  Procedure(s): CORONARY ARTERY BYPASS GRAFTING (CABG) (N/A) TRANSESOPHAGEAL ECHOCARDIOGRAM (TEE) (N/A) as a surgical intervention.  The patient's history has been reviewed, patient examined, no change in status, stable for surgery.  I have reviewed the patient's chart and labs.  Questions were answered to the patient's satisfaction.     [name]

## 2021-01-20 NOTE — Anesthesia Procedure Notes (Signed)
Procedure Name: Intubation Date/Time: 01/20/2021 8:08 AM Performed by: Amadeo Garnet, CRNA Pre-anesthesia Checklist: Patient identified, Emergency Drugs available, Suction available and Patient being monitored Patient Re-evaluated:Patient Re-evaluated prior to induction Oxygen Delivery Method: Circle system utilized Preoxygenation: Pre-oxygenation with 100% oxygen Induction Type: IV induction Ventilation: Mask ventilation without difficulty Laryngoscope Size: Mac and 4 Grade View: Grade I Tube type: Oral Tube size: 8.0 mm Number of attempts: 1 Airway Equipment and Method: Stylet and Oral airway Placement Confirmation: ETT inserted through vocal cords under direct vision, positive ETCO2 and breath sounds checked- equal and bilateral Secured at: 22 cm Tube secured with: Tape Dental Injury: Teeth and Oropharynx as per pre-operative assessment

## 2021-01-20 NOTE — Hospital Course (Addendum)
HPI: This is a 75 year old male admitted following a STEMI.  The patient states that he was asleep and suddenly awoke with left arm pain and crushing chest pain.  He then presented to the emergency department was ruled in for STEMI.  He was taken to the Cath Lab where he underwent PCI with angioplasty to the LAD.  Flow was able to be restored.  The patient was also noted to have significant stenosis to the ostial circumflex.  The patient was also loaded with Brilinta.  An intra-aortic balloon pump was then placed and the patient was then transferred to the ICU. A consultation for consideration of coronary artery bypass grafting surgery was obtained initially with Dr. Cliffton Asters.  Dr. Cliffton Asters discussed the need for coronary artery bypass grafting surgery. Potential risks, complications, and benefits of the surgery were discussed with the patient and he agreed to proceed with surgery. Pre operative carotid duplex US showed  No obvious evidence of stenosis of bilateral internal carotid arteries. He has remained chest pain free. Because of waiting for Brilinta washout and scheduling, Dr. Dorris Fetch took the patient to OR for CABG on Frdiay 01/20/2021.   Hospital Course:  Patient underwent a CABG x 3 on 01/20/2021. Patient was transferred from the OR to [facility] ICU in stable condition.  He was extubated the evening of surgery.  He was weaned off Neo-synephrine on POD #1.  IABP was also removed on POD #1.  His chest tubes were removed without difficulty.He had a mild elevation in his creatinine level which resolved without difficulty.  He was started on diuretics for volume overloaded state.  He developed atrial fibrillation on post-op day 2 that was treated with IV amiodarone loading. He converted back to SR and was transitioned to oral amiodarone. Milrinone was continued for a few days to maintain hemodynamic stability while he was diuresed. He was weaned off Milrinone on 01/24/2021.  Unfortunately, his co-ox level  decreased and Milrinone was resumed.   He has been started on aspirin and statin as well as low-dose carvedilol.  The patient was also started on Digoxin to assist with contractility due to acute systolic heart failure. The echo was repeated on post-op day 5 and showed LVEF of 40-45% with no hemodynamically  significant pericardial effusion. RV function was also normal.  Hemodynamics gradually improved allowing milrinone to be discontinued on post-op day 6. Transfer to 4E progressive care was requested on post-op day 6. He developed some non-sustained runs of VT and PVCs and his Coreg dose was increased.  He developed a slight elevation in his creatinine level which was closely monitored.  He was evaluated by PT who did not feel home health therapy would be indicated.

## 2021-01-20 NOTE — Discharge Summary (Addendum)
Physician Discharge Summary       [address]       [place] [address]             [phone]    Patient ID: ALFREDDIE CONSALVO MRN: [id number] DOB/AGE: 12-13-1945 75 y.o.  Admit date: 01/16/2021 Discharge date: 01/30/2021  Admission Diagnoses: STEMI involving left anterior descending coronary artery (HCC) 2. Cardiogenic shock (HCC) 3. Coronary artery disease   Discharge Diagnoses:  S/P CABG x 3 2. Expected post op blood loss anemia 3. Post op atrial fibrillation 4. History of HTN (hypertension) 5. History of   Mild hyperlipidemia 6. History of obesity 7. History of knee pain 8. History of essential tremor  9. Acute kidney injury after catheterization due to IV contrast Consults: cardiology  Procedure (s):   Operative Report    DATE OF PROCEDURE: 01/20/2021   PREOPERATIVE DIAGNOSIS:  Severe 2-vessel coronary artery disease, status post ST elevation myocardial infarction.   POSTOPERATIVE DIAGNOSIS:  Severe 2-vessel coronary artery disease, status post ST elevation myocardial infarction.   PROCEDURE PERFORMED:  Median sternotomy, extracorporeal circulation, coronary artery bypass grafting x3 (left internal mammary artery to left anterior descending, saphenous vein graft to ramus intermedius, saphenous vein graft to obtuse marginal 1),  endoscopic vein harvest, left thigh.   SURGEON:  Charlett Lango, MD   ASSISTANT:  Doree Fudge PA-C   HPI: This is a 75 year old male admitted following a STEMI.  The patient states that he was asleep and suddenly awoke with left arm pain and crushing chest pain.  He then presented to the emergency department was ruled in for STEMI.  He was taken to the Cath Lab where he underwent PCI with angioplasty to the LAD.  Flow was able to be restored.  The patient was also noted to have significant stenosis to the ostial circumflex.  The patient was also loaded with Brilinta.  An intra-aortic balloon pump was then placed  and the patient was then transferred to the ICU. A consultation for consideration of coronary artery bypass grafting surgery was obtained initially with Dr. Cliffton Asters.  Dr. Cliffton Asters discussed the need for coronary artery bypass grafting surgery. Potential risks, complications, and benefits of the surgery were discussed with the patient and he agreed to proceed with surgery. Pre operative carotid duplex US showed  No obvious evidence of stenosis of bilateral internal carotid arteries. He has remained chest pain free. Because of waiting for Brilinta washout and scheduling, Dr. Dorris Fetch took the patient to OR for CABG on Frdiay 01/20/2021.   Hospital Course:  Patient underwent a CABG x 3 on 01/20/2021. Patient was transferred from the OR to [facility] ICU in stable condition.  He was extubated the evening of surgery.  He was weaned off Neo-synephrine on POD #1.  IABP was also removed on POD #1.  His chest tubes were removed without difficulty.He had a mild elevation in his creatinine level which resolved without difficulty.  He was started on diuretics for volume overloaded state.  He developed atrial fibrillation on post-op day 2 that was treated with IV amiodarone loading. He converted back to SR and was transitioned to oral amiodarone. Milrinone was continued for a few days to maintain hemodynamic stability while he was diuresed. He was weaned off Milrinone on 01/24/2021.  Unfortunately, his co-ox level decreased and Milrinone was resumed.   He has been started on aspirin and statin as well as low-dose carvedilol.  The patient was also started on Digoxin to assist with  contractility due to acute systolic heart failure. The echo was repeated on post-op day 5 and showed LVEF of 40-45% with no hemodynamically  significant pericardial effusion. RV function was also normal.  Hemodynamics gradually improved allowing milrinone to be discontinued on post-op day 6. Transfer to 4E progressive care was requested on post-op day  6. He developed some non-sustained runs of VT and PVCs and his Coreg dose was increased and this resolved.  He developed a slight elevation in his creatinine level which was closely monitored and remained stable.  He was evaluated by PT who did not feel home health therapy would be indicated but recommended a hospital bed and rolling walker for home use. These were arranged by the care management team.    Addendum: Patient's DME items were not delivered to the patient's home.  He required additional overnight stay due to this.  He remains medically stable and will be discharged home today.  Latest Vital Signs: Blood pressure 102/65, pulse 70, temperature 98.4 F (36.9 C), temperature source Oral, resp. rate 19, height 6\' 3"  (1.905 m), weight 117.3 kg, SpO2 94 %.  Physical Exam:  General appearance: alert and oriented, no distress Neurologic: intact Heart: regular rate and rhythm, no arrhythmias Lungs: clear to auscultation bilaterally Extremities: edema unchanged Wound: clean and dry  Discharge Condition: Stable and discharged to home.  Recent laboratory studies:  Lab Results  Component Value Date   WBC 12.7 (H) 01/28/2021   HGB 8.6 (L) 01/28/2021   HCT 27.0 (L) 01/28/2021   MCV 90.6 01/28/2021   PLT 590 (H) 01/28/2021   Lab Results  Component Value Date   NA 137 01/29/2021   K 4.1 01/29/2021   CL 103 01/29/2021   CO2 24 01/29/2021   CREATININE 1.49 (H) 01/29/2021   GLUCOSE 114 (H) 01/29/2021      Diagnostic Studies: CARDIAC CATHETERIZATION  Result Date: 01/16/2021   Mid LM to Dist LM lesion is 20% stenosed.   Ost LAD to Prox LAD lesion is 100% stenosed.   Ost Cx lesion is 90% stenosed.   Ost RCA lesion is 20% stenosed.   Post intervention, there is a 60% residual stenosis.   There is moderate left ventricular systolic dysfunction.   LV end diastolic pressure is moderately elevated.   The left ventricular ejection fraction is 35-45% by visual estimate. Acute anterolateral  myocardial infarction secondary to total near ostial occlusion of a large LAD which wraps around the LV apex. Large normal ramus intermediate vessel. Left circumflex vessel with 85 to 90% ostial stenosis. Large RCA with 20% irregularity proximally. PTCA of the LAD ostium with hypotension and idioventricular rhythm with reperfusion requiring initiation of Levophed reestablishment of TIMI-3 flow to a large LAD vessel which wraps around the LV apex. Insertion of an intra-aortic balloon pump for optimal hemodynamic support with augmented diastolic pressure at 150 millimeters mercury. RECOMMENDATION: In light of the patient's complex anatomy, stenting was not performed due to high likelihood of jailing a very large ramus intermediate vessel and with concomitant ostial circumflex stenosis it was felt that surgical revascularization once patient stabilizes from his myocardial infarction would be optimal therapy.  He did receive 1 dose of oral Brilinta in the laboratory which will not be continued.  We will plan 2D echo Doppler today.  Initiate aggressive lipid-lowering therapy.  Dr. 03/18/2021 was contacted who will see the patient and depending upon the patient;s hemodynamic stability perform CABG revascularization surgery this week following washout of his initial Brilinta dose.  DG Chest Port 1 View  Result Date: 01/28/2021 CLINICAL DATA:  Status post CABG EXAM: PORTABLE CHEST 1 VIEW COMPARISON:  January 26, 2021 FINDINGS: No pneumothorax. Sternotomy wires are intact. The cardiomediastinal silhouette is stable. The right lung is clear. There is a layering effusion on the left with underlying atelectasis. No other change. No support apparatus identified. IMPRESSION: 1. Small stable left layering effusion with underlying atelectasis. No other change or acute abnormalities. Electronically Signed   By: Gerome Sam III M.D.   On: 01/28/2021 08:35   DG Chest Port 1 View  Result Date: 01/26/2021 CLINICAL DATA:   Pleural effusion EXAM: PORTABLE CHEST 1 VIEW COMPARISON:  Radiograph 01/24/2021 FINDINGS: Unchanged right neck central venous catheter sheath. Prior median sternotomy and postsurgical changes of CABG. Unchanged, enlarged cardiomediastinal silhouette. Persistent interstitial opacities and left-sided pleural effusion and adjacent left basilar dense opacities. Unchanged small right pleural effusion. No visible pneumothorax. No acute osseous abnormality. IMPRESSION: Unchanged interstitial edema and bilateral, left greater than right pleural effusions with associated basilar atelectasis/consolidation. No new findings. Electronically Signed   By: Caprice Renshaw M.D.   On: 01/26/2021 09:06   DG Chest Port 1 View  Result Date: 01/24/2021 CLINICAL DATA:  Open heart surgery EXAM: PORTABLE CHEST 1 VIEW COMPARISON:  01/23/2021 FINDINGS: No significant change in AP portable chest radiographs with cardiomegaly status post median sternotomy and CABG, with layering bilateral pleural effusions and associated atelectasis or consolidation. No new airspace opacity. Right neck vascular sheath remains in position. IMPRESSION: No significant change in AP portable chest radiographs with cardiomegaly status post median sternotomy and CABG, with layering bilateral pleural effusions and associated atelectasis or consolidation. No new airspace opacity. Electronically Signed   By: Lauralyn Primes M.D.   On: 01/24/2021 08:43   DG CHEST PORT 1 VIEW  Result Date: 01/23/2021 CLINICAL DATA:  Sore chest s/p CABG EXAM: PORTABLE CHEST 1 VIEW COMPARISON:  01/22/2021 FINDINGS: Sternotomy wires overlie normal cardiac silhouette. LEFT basilar atelectasis and small effusion are unchanged. No overt pulmonary edema. No pneumothorax. IMPRESSION: 1. No significant interval change. 2. LEFT basilar atelectasis and LEFT effusion. Electronically Signed   By: Genevive Bi M.D.   On: 01/23/2021 08:43   DG Chest Port 1 View  Result Date: 01/22/2021 CLINICAL  DATA:  History of CABG. EXAM: PORTABLE CHEST 1 VIEW COMPARISON:  January 21, 2021 FINDINGS: The mediastinal contour and cardiac silhouette are stable. Heart size enlarged. Right jugular vascular sheath is identified distal tip in the superior vena cava. Patchy consolidation of left lung base identified. Minimal atelectasis of right lung base is noted. Probable left pleural effusion is noted. The bony structures are stable. IMPRESSION: Patchy consolidation of left lung base, pneumonia is not excluded. Probable left pleural effusion. Electronically Signed   By: Sherian Rein M.D.   On: 01/22/2021 08:32   DG Chest Port 1 View  Result Date: 01/21/2021 CLINICAL DATA:  Pneumothorax.  Hypertension. EXAM: PORTABLE CHEST 1 VIEW COMPARISON:  01/20/2021 FINDINGS: Extubation and removal of nasogastric tube. Right IJ Swan-Ganz catheter remains in place. Bilateral chest tubes. Median sternotomy for CABG. Midline trachea. Mild cardiomegaly. Small left pleural effusion, felt to be increased. No pneumothorax. No congestive failure. Increased left base airspace disease. IMPRESSION: Removal of support apparatus with slight worsening left base aeration, likely due to pleural fluid and progressive atelectasis. No pneumothorax or other acute complication after median sternotomy. Electronically Signed   By: Jeronimo Greaves M.D.   On: 01/21/2021 08:47   DG Chest [facility]  1 View  Result Date: 01/20/2021 CLINICAL DATA:  Evaluate for pneumothorax. EXAM: PORTABLE CHEST 1 VIEW COMPARISON:  01/20/2021 FINDINGS: Endotracheal tube is approximately 5.5 cm above the carina. There is a Swan-Ganz catheter with the tip extending into distal right pulmonary artery region. There is a chest tube overlying the medial right chest and a second tube in the left chest. Negative for pneumothorax. Densities at the left lung base are suggestive for atelectasis. Post CABG changes. Nasogastric tube extends into the abdomen. IMPRESSION: 1. Chest tubes without  pneumothorax. 2. Left basilar chest densities are most compatible with atelectasis. 3. Endotracheal tube is appropriately positioned. Electronically Signed   By: Richarda Overlie M.D.   On: 01/20/2021 14:39   DG Chest Port 1 View  Result Date: 01/20/2021 CLINICAL DATA:  Balloon pump present EXAM: PORTABLE CHEST 1 VIEW COMPARISON:  Chest x-ray dated January 19, 2021 FINDINGS: Intra-aortic balloon pump marker is positioned approximately 3.0 cm from the superior aortic arch, similar to prior exam. Cardiac and mediastinal contours are unchanged. Mild bilateral heterogeneous opacities, new compared to prior exam. No large pleural effusion or evidence of pneumothorax. IMPRESSION: Unchanged position of intra-aortic balloon pump marker. New mild bilateral heterogeneous opacities, likely due to pulmonary edema. Electronically Signed   By: Allegra Lai M.D.   On: 01/20/2021 08:52   DG Chest Port 1 View  Result Date: 01/19/2021 CLINICAL DATA:  Pneumothorax EXAM: PORTABLE CHEST 1 VIEW COMPARISON:  01/17/2021 FINDINGS: No significant change in AP portable examination. Cardiomegaly. IABP tip marker remains projecting over the aortic arch. No pneumothorax. IMPRESSION: 1. No significant change in AP portable examination. No pneumothorax per ordering indication. 2.  Cardiomegaly. 3.  IABP tip marker remains projecting over the aortic arch. Electronically Signed   By: Lauralyn Primes M.D.   On: 01/19/2021 10:56   DG CHEST PORT 1 VIEW  Addendum Date: 01/17/2021   ADDENDUM REPORT: 01/17/2021 09:00 ADDENDUM: Findings discussed with Dr. [name] via telephone at 8:52 a.m. Electronically Signed   By: Feliberto Harts M.D.   On: 01/17/2021 09:00   Result Date: 01/17/2021 CLINICAL DATA:  Encounter for management of intra-aortic balloon pump Z45.09 (ICD-10-CM) EXAM: PORTABLE CHEST 1 VIEW COMPARISON:  None. FINDINGS: The aortic balloon marker is positioned high, at the level of the aortic knob. Mildly enlarged appearance of the visualized  aorta. Mild enlargement the cardiac silhouette. Lungs are clear. No visible pleural effusions or pneumothorax. No acute osseous abnormality. IMPRESSION: 1. The aortic balloon marker is positioned high, at the level of the aortic knob. 2. Mildly enlarged appearance of the visualized aorta. If available, recommend correlation with outside prior chest imaging to assess stability of this finding. If not available, a CTA of the chest could further evaluate for aortic aneurysm or other aortic pathology. 3. None cardiomegaly. Electronically Signed: By: Feliberto Harts M.D. On: 01/17/2021 08:42   ECHOCARDIOGRAM COMPLETE  Result Date: 01/25/2021    ECHOCARDIOGRAM REPORT   Patient Name:   DEMETRIE BORGE Date of Exam: 01/25/2021 Medical Rec #:  [id number]       Height:       75.0 in Accession #:    [id number]      Weight:       264.6 lb Date of Birth:  [birth year]       BSA:          2.471 m Patient Age:    75 years        BP:  115/76 mmHg Patient Gender: M               HR:           85 bpm. Exam Location:  Inpatient Procedure: 2D Echo, Cardiac Doppler, Color Doppler and 3D Echo Indications:    Myocardial Infact I21,9  History:        Patient has prior history of Echocardiogram examinations, most                 recent 01/20/2021. CAD; Prior CABG. Cardiogenic shock_EF 40 to 45%                 during this admit with normal RV function Acute systolic heart                 failure. Paroxysmal Atrial fibrillation.  Sonographer:    Leta Jungling RDCS Referring Phys: [id number] Jeanae Whitmill C Jenai Scaletta IMPRESSIONS  1. Global hypokinesis. Anteroseptal, mid-apical inferoseptal, apicl inferior, and apical akinesis. Left ventricular ejection fraction, by estimation, is 40 to 45%. The left ventricle has mildly decreased function. The left ventricle demonstrates regional wall motion abnormalities (see scoring diagram/findings for description). There is moderate asymmetric left ventricular hypertrophy of the basal-septal segment. Left  ventricular diastolic parameters are consistent with Grade II diastolic dysfunction (pseudonormalization). The average left ventricular global longitudinal strain is -8.8 %. The global longitudinal strain is abnormal.  2. Right ventricular systolic function is normal. The right ventricular size is normal. There is normal pulmonary artery systolic pressure.  3. Small pericarcdial effusion measuring 0.74 cm. a small pericardial effusion is present. The pericardial effusion is posterior to the left ventricle.  4. The mitral valve is normal in structure. No evidence of mitral valve regurgitation. No evidence of mitral stenosis.  5. The aortic valve is tricuspid. Aortic valve regurgitation is not visualized. No aortic stenosis is present.  6. Aortic dilatation noted. There is mild dilatation of the ascending aorta, measuring 41 mm. There is mild dilatation of the aortic root, measuring 43 mm.  7. The inferior vena cava is normal in size with <50% respiratory variability, suggesting right atrial pressure of 8 mmHg. FINDINGS  Left Ventricle: Global hypokinesis. Anteroseptal, mid-apical inferoseptal, apicl inferior, and apical akinesis. Left ventricular ejection fraction, by estimation, is 40 to 45%. The left ventricle has mildly decreased function. The left ventricle demonstrates regional wall motion abnormalities. The average left ventricular global longitudinal strain is -8.8 %. The global longitudinal strain is abnormal. The left ventricular internal cavity size was normal in size. There is moderate asymmetric left ventricular hypertrophy of the basal-septal segment. Left ventricular diastolic parameters are consistent with Grade II diastolic dysfunction (pseudonormalization). Indeterminate filling pressures. Right Ventricle: The right ventricular size is normal. No increase in right ventricular wall thickness. Right ventricular systolic function is normal. There is normal pulmonary artery systolic pressure. The  tricuspid regurgitant velocity is 1.96 m/s, and  with an assumed right atrial pressure of 8 mmHg, the estimated right ventricular systolic pressure is 23.4 mmHg. Left Atrium: Left atrial size was normal in size. Right Atrium: Right atrial size was normal in size. Pericardium: Small pericarcdial effusion measuring 0.74 cm. A small pericardial effusion is present. The pericardial effusion is posterior to the left ventricle. Mitral Valve: The mitral valve is normal in structure. No evidence of mitral valve regurgitation. No evidence of mitral valve stenosis. Tricuspid Valve: The tricuspid valve is normal in structure. Tricuspid valve regurgitation is trivial. No evidence of tricuspid stenosis. Aortic Valve: The aortic  valve is tricuspid. Aortic valve regurgitation is not visualized. No aortic stenosis is present. Pulmonic Valve: The pulmonic valve was normal in structure. Pulmonic valve regurgitation is trivial. No evidence of pulmonic stenosis. Aorta: Aortic dilatation noted. There is mild dilatation of the ascending aorta, measuring 41 mm. There is mild dilatation of the aortic root, measuring 43 mm. Venous: The inferior vena cava is normal in size with less than 50% respiratory variability, suggesting right atrial pressure of 8 mmHg. IAS/Shunts: No atrial level shunt detected by color flow Doppler.  LEFT VENTRICLE PLAX 2D LVIDd:         4.60 cm  Diastology LVIDs:         3.40 cm  LV e' medial:    6.10 cm/s LV PW:         0.80 cm  LV E/e' medial:  14.6 LV IVS:        1.50 cm  LV e' lateral:   5.69 cm/s LVOT diam:     2.00 cm  LV E/e' lateral: 15.6 LV SV:         61 LV SV Index:   24       2D Longitudinal Strain LVOT Area:     3.14 cm 2D Strain GLS (A2C):   -10.7 %                         2D Strain GLS (A3C):   -4.7 %                         2D Strain GLS (A4C):   -11.1 %                         2D Strain GLS Avg:     -8.8 %                          3D Volume EF:                         3D EF:        49 %                          LV EDV:       218 ml                         LV ESV:       111 ml                         LV SV:        107 ml RIGHT VENTRICLE RV S prime:     11.50 cm/s TAPSE (M-mode): 1.2 cm LEFT ATRIUM             Index       RIGHT ATRIUM           Index LA diam:        3.20 cm 1.29 cm/m  RA Area:     12.80 cm LA Vol (A2C):   35.7 ml 14.45 ml/m RA Volume:   24.20 ml  9.79 ml/m LA Vol (A4C):   16.6 ml 6.72 ml/m LA Biplane Vol: 25.5 ml 10.32 ml/m  AORTIC VALVE LVOT Vmax:   119.67 cm/s  LVOT Vmean:  75.400 cm/s LVOT VTI:    0.193 m  AORTA Ao Root diam: 4.30 cm Ao Asc diam:  4.10 cm MITRAL VALVE               TRICUSPID VALVE MV Area (PHT): 4.64 cm    TR Peak grad:   15.4 mmHg MV Decel Time: 163 msec    TR Vmax:        195.95 cm/s MV E velocity: 88.90 cm/s MV A velocity: 72.93 cm/s  SHUNTS MV E/A ratio:  1.22        Systemic VTI:  0.19 m                            Systemic Diam: 2.00 cm Chilton Siiffany [place] MD Electronically signed by Chilton Siiffany [place] MD Signature Date/Time: 01/25/2021/4:17:47 PM    Final    ECHOCARDIOGRAM COMPLETE  Result Date: 01/19/2021    ECHOCARDIOGRAM REPORT   Patient Name:   Raynaldo OpitzROGER B Lasure Date of Exam: 01/19/2021 Medical Rec #:  [id number]       Height:       75.0 in Accession #:    [id number]      Weight:       259.5 lb Date of Birth:  Nov 23, 1945       BSA:          2.451 m Patient Age:    75 years        BP:           122/86 mmHg Patient Gender: M               HR:           79 bpm. Exam Location:  Inpatient Procedure: 2D Echo, Cardiac Doppler and Color Doppler Indications:     STEMI and chest pain  History:         Patient has prior history of Echocardiogram examinations, most                  recent 01/16/2021. Risk Factors:Dyslipidemia and Hypertension.  Sonographer:     Neomia DearAMARA CROWN RDCS Referring Phys:  [id number] Salvatore DecentSTEVEN C Roxy Mastandrea Diagnosing Phys: Epifanio Lescheshristopher Schumann MD IMPRESSIONS  1. Left ventricular ejection fraction, by estimation, is 40 to 45%. The left ventricle has mildly decreased  function. The left ventricle demonstrates regional wall motion abnormalities (see scoring diagram/findings for description). There is severe asymmetric left ventricular hypertrophy of the basal-septal segment. Left ventricular diastolic parameters are consistent with Grade II diastolic dysfunction (pseudonormalization). Elevated left atrial pressure.  2. Right ventricular systolic function is normal. The right ventricular size is normal. There is mildly elevated pulmonary artery systolic pressure.  3. The mitral valve is normal in structure. Trivial mitral valve regurgitation.  4. The aortic valve was not well visualized. Aortic valve regurgitation is not visualized. No aortic stenosis is present.  5. Aortic dilatation noted. There is dilatation of the ascending aorta, measuring 43 mm. There is dilatation of the aortic root, measuring 40 mm. FINDINGS  Left Ventricle: Left ventricular ejection fraction, by estimation, is 40 to 45%. The left ventricle has mildly decreased function. The left ventricle demonstrates regional wall motion abnormalities. The left ventricular internal cavity size was normal in size. There is severe asymmetric left ventricular hypertrophy of the basal-septal segment. Left ventricular diastolic parameters are consistent with Grade II diastolic dysfunction (pseudonormalization). Elevated left atrial pressure.  LV Wall Scoring: The mid and distal anterior  wall, mid anteroseptal segment, and apex are akinetic. The basal anteroseptal segment, mid inferoseptal segment, apical septal segment, and basal anterior segment are hypokinetic. The entire lateral wall, entire inferior wall, and basal inferoseptal segment are normal. Right Ventricle: The right ventricular size is normal. No increase in right ventricular wall thickness. Right ventricular systolic function is normal. There is mildly elevated pulmonary artery systolic pressure. The tricuspid regurgitant velocity is 2.90  m/s, and with an assumed  right atrial pressure of 8 mmHg, the estimated right ventricular systolic pressure is 41.6 mmHg. Left Atrium: Left atrial size was normal in size. Right Atrium: Right atrial size was normal in size. Pericardium: Trivial pericardial effusion is present. Presence of pericardial fat pad. Mitral Valve: The mitral valve is normal in structure. Trivial mitral valve regurgitation. Tricuspid Valve: The tricuspid valve is normal in structure. Tricuspid valve regurgitation is trivial. Aortic Valve: The aortic valve was not well visualized. Aortic valve regurgitation is not visualized. No aortic stenosis is present. Aortic valve mean gradient measures 4.0 mmHg. Aortic valve peak gradient measures 6.5 mmHg. Aortic valve area, by VTI measures 3.52 cm. Pulmonic Valve: The pulmonic valve was not well visualized. Pulmonic valve regurgitation is not visualized. Aorta: Aortic dilatation noted. There is dilatation of the ascending aorta, measuring 43 mm. There is dilatation of the aortic root, measuring 40 mm. IAS/Shunts: The interatrial septum was not well visualized.  LEFT VENTRICLE PLAX 2D LVIDd:         4.90 cm     Diastology LVIDs:         3.60 cm     LV e' medial:    5.11 cm/s LV PW:         1.20 cm     LV E/e' medial:  17.1 LV IVS:        1.80 cm     LV e' lateral:   7.72 cm/s LVOT diam:     2.30 cm     LV E/e' lateral: 11.3 LV SV:         79 LV SV Index:   32 LVOT Area:     4.15 cm  LV Volumes (MOD) LV vol d, MOD A2C: 49.6 ml LV vol d, MOD A4C: 77.0 ml LV vol s, MOD A2C: 29.2 ml LV vol s, MOD A4C: 38.7 ml LV SV MOD A2C:     20.4 ml LV SV MOD A4C:     77.0 ml LV SV MOD BP:      28.9 ml RIGHT VENTRICLE TAPSE (M-mode): 3.0 cm LEFT ATRIUM             Index       RIGHT ATRIUM           Index LA diam:        3.60 cm 1.47 cm/m  RA Area:     11.60 cm LA Vol (A2C):   30.9 ml 12.61 ml/m RA Volume:   21.80 ml  8.89 ml/m LA Vol (A4C):   40.1 ml 16.36 ml/m LA Biplane Vol: 34.3 ml 13.99 ml/m  AORTIC VALVE                   PULMONIC  VALVE AV Area (Vmax):    3.60 cm    PV Vmax:       1.05 m/s AV Area (Vmean):   3.32 cm    PV Vmean:      70.600 cm/s AV Area (VTI):     3.52 cm  PV VTI:        0.190 m AV Vmax:           127.00 cm/s PV Peak grad:  4.4 mmHg AV Vmean:          88.000 cm/s PV Mean grad:  2.0 mmHg AV VTI:            0.223 m AV Peak Grad:      6.5 mmHg AV Mean Grad:      4.0 mmHg LVOT Vmax:         110.00 cm/s LVOT Vmean:        70.400 cm/s LVOT VTI:          0.189 m LVOT/AV VTI ratio: 0.85  AORTA Ao Root diam: 4.00 cm Ao Asc diam:  4.30 cm MITRAL VALVE               TRICUSPID VALVE MV Area (PHT): 4.49 cm    TR Peak grad:   33.6 mmHg MV Decel Time: 169 msec    TR Vmax:        290.00 cm/s MV E velocity: 87.30 cm/s MV A velocity: 66.90 cm/s  SHUNTS MV E/A ratio:  1.30        Systemic VTI:  0.19 m                            Systemic Diam: 2.30 cm Epifanio Lesches MD Electronically signed by Epifanio Lesches MD Signature Date/Time: 01/19/2021/2:40:40 PM    Final (Updated)    ECHOCARDIOGRAM COMPLETE  Result Date: 01/16/2021    ECHOCARDIOGRAM REPORT   Patient Name:   DARRIN KOMAN Date of Exam: 01/16/2021 Medical Rec #:  [id number]       Height:       75.0 in Accession #:    [id number]      Weight:       260.0 lb Date of Birth:  [birth year]       BSA:          2.453 m Patient Age:    75 years        BP:           146/84 mmHg Patient Gender: M               HR:           72 bpm. Exam Location:  Inpatient Procedure: 2D Echo, Color Doppler and Cardiac Doppler Indications:    STEMI  History:        Patient has no prior history of Echocardiogram examinations.                 Acute MI.  Sonographer:    Delcie Roch RDCS Referring Phys: [id number] DENNIS NARCISSE JR IMPRESSIONS  1. Left ventricular ejection fraction, by estimation, is 35%. The left ventricle has moderately decreased function. The left ventricle demonstrates regional wall motion abnormalities with basal to mid anteroseptal, inferoseptal, and anterior akinesis, apical  anterior akinesis, apical inferior akinesis. There is mild left ventricular hypertrophy. Left ventricular diastolic parameters are consistent with Grade I diastolic dysfunction (impaired relaxation).  2. Hypokinesis of the apical RV. Right ventricular systolic function is mildly reduced. The right ventricular size is normal. Tricuspid regurgitation signal is inadequate for assessing PA pressure.  3. The mitral valve is normal in structure. Trivial mitral valve regurgitation. No evidence of mitral stenosis.  4. The aortic valve is tricuspid. Aortic valve regurgitation is  not visualized. No aortic stenosis is present.  5. Aortic dilatation noted. There is mild dilatation of the ascending aorta, measuring 42 mm.  6. The inferior vena cava is normal in size with greater than 50% respiratory variability, suggesting right atrial pressure of 3 mmHg. FINDINGS  Left Ventricle: Left ventricular ejection fraction, by estimation, is 35%. The left ventricle has moderately decreased function. The left ventricle demonstrates regional wall motion abnormalities. The left ventricular internal cavity size was normal in size. There is mild left ventricular hypertrophy. Left ventricular diastolic parameters are consistent with Grade I diastolic dysfunction (impaired relaxation). Right Ventricle: Hypokinesis of the apical RV. The right ventricular size is normal. No increase in right ventricular wall thickness. Right ventricular systolic function is mildly reduced. Tricuspid regurgitation signal is inadequate for assessing PA pressure. Left Atrium: Left atrial size was normal in size. Right Atrium: Right atrial size was normal in size. Pericardium: There is no evidence of pericardial effusion. Mitral Valve: The mitral valve is normal in structure. Trivial mitral valve regurgitation. No evidence of mitral valve stenosis. Tricuspid Valve: The tricuspid valve is normal in structure. Tricuspid valve regurgitation is not demonstrated. Aortic  Valve: The aortic valve is tricuspid. Aortic valve regurgitation is not visualized. No aortic stenosis is present. Pulmonic Valve: The pulmonic valve was normal in structure. Pulmonic valve regurgitation is trivial. Aorta: Aortic dilatation noted. There is mild dilatation of the ascending aorta, measuring 42 mm. Venous: The inferior vena cava is normal in size with greater than 50% respiratory variability, suggesting right atrial pressure of 3 mmHg. IAS/Shunts: No atrial level shunt detected by color flow Doppler.  LEFT VENTRICLE PLAX 2D LVIDd:         4.30 cm     Diastology LVIDs:         3.10 cm     LV e' medial:    7.07 cm/s LV PW:         1.10 cm     LV E/e' medial:  8.7 LV IVS:        1.20 cm     LV e' lateral:   7.29 cm/s LVOT diam:     1.90 cm     LV E/e' lateral: 8.4 LV SV:         48 LV SV Index:   20 LVOT Area:     2.84 cm  LV Volumes (MOD) LV vol d, MOD A2C: 80.3 ml LV vol s, MOD A2C: 45.5 ml LV SV MOD A2C:     34.8 ml RIGHT VENTRICLE             IVC RV S prime:     17.70 cm/s  IVC diam: 1.90 cm TAPSE (M-mode): 2.5 cm LEFT ATRIUM           Index       RIGHT ATRIUM           Index LA diam:      3.30 cm 1.35 cm/m  RA Area:     14.30 cm LA Vol (A4C): 40.1 ml 16.35 ml/m RA Volume:   32.30 ml  13.17 ml/m  AORTIC VALVE LVOT Vmax:   110.00 cm/s LVOT Vmean:  68.600 cm/s LVOT VTI:    0.170 m  AORTA Ao Root diam: 3.90 cm Ao Asc diam:  4.20 cm MITRAL VALVE MV Area (PHT): 3.30 cm    SHUNTS MV Decel Time: 230 msec    Systemic VTI:  0.17 m MV E velocity: 61.20 cm/s  Systemic  Diam: 1.90 cm MV A velocity: 94.30 cm/s MV E/A ratio:  0.65 Dalton McleanMD Electronically signed by Wilfred Lacy Signature Date/Time: 01/16/2021/11:11:57 AM    Final    ECHO INTRAOPERATIVE TEE  Result Date: 01/20/2021  *INTRAOPERATIVE TRANSESOPHAGEAL REPORT *  Patient Name:   DARRIS STAIGER Date of Exam: 01/20/2021 Medical Rec #:  [id number]       Height:       75.0 in Accession #:    [id number]      Weight:       255.5 lb Date of Birth:   1946-03-27       BSA:          2.44 m Patient Age:    75 years        BP:           122/66 mmHg Patient Gender: M               HR:           58 bpm. Exam Location:  Inpatient Transesophogeal exam was perform intraoperatively during surgical procedure. Patient was closely monitored under general anesthesia during the entirety of examination. Indications:     coronary artery bypass surgery Performing Phys: [id number] Salvatore Decent Shayan Bramhall Diagnosing Phys: Karna Christmas MD Complications: No known complications during this procedure. POST-OP IMPRESSIONS _ Left Ventricle: The left ventricle is unchanged from pre-bypass. _ Right Ventricle: The right ventricle appears unchanged from pre-bypass. _ Aorta: The aorta appears unchanged from pre-bypass. _ Left Atrial Appendage: The left atrial appendage appears unchanged from pre-bypass. _ Aortic Valve: The aortic valve appears unchanged from pre-bypass. _ Mitral Valve: The mitral valve appears unchanged from pre-bypass. _ Tricuspid Valve: There is mild regurgitation. _ Pulmonic Valve: There is trace regurgitation. _ Interatrial Septum: The interatrial septum appears unchanged from pre-bypass. _ Pericardium: The pericardium appears unchanged from pre-bypass. PRE-OP FINDINGS  Left Ventricle: The left ventricle has moderately reduced systolic function, with an ejection fraction of 35-40%. The cavity size was normal. There is moderately increased left ventricular wall thickness. There is moderate eccentric left ventricular hypertrophy of the septal segment. Right Ventricle: The right ventricle has normal systolic function. The cavity was normal. There is no increase in right ventricular wall thickness. Left Atrium: Left atrial size was normal in size. No left atrial/left atrial appendage thrombus was detected. Right Atrium: Right atrial size was normal in size. Interatrial Septum: No atrial level shunt detected by color flow Doppler. Pericardium: There is no evidence of pericardial  effusion. Mitral Valve: The mitral valve is normal in structure. Mitral valve regurgitation is mild by color flow Doppler. Tricuspid Valve: The tricuspid valve was normal in structure. Tricuspid valve regurgitation was not visualized by color flow Doppler. Aortic Valve: The aortic valve is normal in structure. Aortic valve regurgitation was not visualized by color flow Doppler. Pulmonic Valve: The pulmonic valve was normal in structure. Pulmonic valve regurgitation is not visualized by color flow Doppler. Aorta: The aortic root, ascending aorta, and aortic arch are normal in size and structure. The descending aorta was not well visualized due to the presence of a balloon pump.  Karna Christmas MD Electronically signed by Karna Christmas MD Signature Date/Time: 01/20/2021/2:30:32 PM    Final    VAS US DOPPLER PRE CABG  Result Date: 01/18/2021 PREOPERATIVE VASCULAR EVALUATION Patient Name:  MARQUICE UDDIN  Date of Exam:   01/18/2021 Medical Rec #: [id number]        Accession #:    [id number]  Date of Birth: 1946/01/08        Patient Gender: M Patient Age:   32 years Exam Location:  [facility] Procedure:      VAS US DOPPLER PRE CABG Referring Phys: HARRELL LIGHTFOOT --------------------------------------------------------------------------------  Indications:      Pre-CABG. Risk Factors:     Hypertension, hyperlipidemia. Limitations:      patient on balloon pump- unable to evaluate waveforms Comparison Study: nov prior Performing Technologist: [place] Ponder RVS  Examination Guidelines: A complete evaluation includes B-mode imaging, spectral Doppler, color Doppler, and power Doppler as needed of all accessible portions of each vessel. Bilateral testing is considered an integral part of a complete examination. Limited examinations for reoccurring indications may be performed as noted.  Right Carotid Findings: +----------+--------+--------+--------+--------+-------------------------------+           PSV cm/sEDV  cm/sStenosisDescribeComments                        +----------+--------+--------+--------+--------+-------------------------------+ CCA Prox                                  unable to accurately obtain                                               waveforms due to balloon pump   +----------+--------+--------+--------+--------+-------------------------------+ CCA Distal                                unable to accurately obtain                                               waveforms due to balloon pump   +----------+--------+--------+--------+--------+-------------------------------+ ICA Prox                                  unable to accurately obtain                                               waveforms due to balloon pump   +----------+--------+--------+--------+--------+-------------------------------+ ECA                                       unable to accurately obtain                                               waveforms due to balloon pump   +----------+--------+--------+--------+--------+-------------------------------+  Left Carotid Findings: +----------+--------+--------+--------+--------+-------------------------------+           PSV cm/sEDV cm/sStenosisDescribeComments                        +----------+--------+--------+--------+--------+-------------------------------+ CCA Prox  unable to accurately obtain                                               waveforms due to balloon pump   +----------+--------+--------+--------+--------+-------------------------------+ CCA Distal                                unable to accurately obtain                                               waveforms due to balloon pump   +----------+--------+--------+--------+--------+-------------------------------+ ICA Prox                                  unable to accurately obtain                                                waveforms due to balloon pump   +----------+--------+--------+--------+--------+-------------------------------+ ECA                                       unable to accurately obtain                                               waveforms due to balloon pump   +----------+--------+--------+--------+--------+-------------------------------+  ABI Findings: +--------+------------------+-----+--------+-----------------------------------+ Right   Rt Pressure (mmHg)IndexWaveformComment                             +--------+------------------+-----+--------+-----------------------------------+ [id number]                            unable to obtain waveforms due to                                          balloon pump                        +--------+------------------+-----+--------+-----------------------------------+ PTA     127               1.10         unable to obtain waveforms due to                                          balloon pump                        +--------+------------------+-----+--------+-----------------------------------+ DP      122  1.06         unable to obtain waveforms due to                                          balloon pump                        +--------+------------------+-----+--------+-----------------------------------+ +--------+------------------+-----+--------+-----------------------------------+ Left    Lt Pressure (mmHg)IndexWaveformComment                             +--------+------------------+-----+--------+-----------------------------------+ [id number]                            unable to obtain waveforms due to                                          balloon pump                        +--------+------------------+-----+--------+-----------------------------------+ PTA     109               0.95         unable to obtain waveforms due to                                           balloon pump                        +--------+------------------+-----+--------+-----------------------------------+ DP      103               0.90         unable to obtain waveforms due to                                          balloon pump                        +--------+------------------+-----+--------+-----------------------------------+  Right Doppler Findings: +--------+--------+-----+-------+----------------------------------------------+ Site    PressureIndexDopplerComments                                       +--------+--------+-----+-------+----------------------------------------------+ [id number]                 unable to obtain waveforms due to balloon pump +--------+--------+-----+-------+----------------------------------------------+ Radial                      unable to accurately obtain waveforms due to                               balloon pump                                   +--------+--------+-----+-------+----------------------------------------------+ Ulnar  unable to accurately obtain waveforms due to                               balloon pump                                   +--------+--------+-----+-------+----------------------------------------------+  Left Doppler Findings: +--------+--------+-----+-------+----------------------------------------------+ Site    PressureIndexDopplerComments                                       +--------+--------+-----+-------+----------------------------------------------+ [id number]                 unable to obtain waveforms due to balloon pump +--------+--------+-----+-------+----------------------------------------------+ Radial                      unable to accurately obtain waveforms due to                               balloon pump                                    +--------+--------+-----+-------+----------------------------------------------+ Ulnar                       unable to accurately obtain waveforms due to                               balloon pump                                   +--------+--------+-----+-------+----------------------------------------------+  Summary: Right Carotid: No obvious evidence of stenosis in the Right ICA- unable to                accurately obtain waveforms/ velocities due to balloon pump. Left Carotid: No obvious evidence of stenosis in the Left ICA -unable to               accurately obtain waveforms/ velocities due to balloon pump. Right ABI: Unable to accurately obtain waveforms due to balloon pump. Left ABI: Unable to accurately obtain waveforms due to balloon pump. Right Upper Extremity: Doppler waveforms decrease 50% with right radial compression. Doppler waveforms remain within normal limits with right ulnar compression. Left Upper Extremity: Doppler waveforms decrease 50% with left radial compression. Doppler waveforms remain within normal limits with left ulnar compression.  Electronically signed by Sherald Hess MD on 01/18/2021 at 2:37:02 PM.    Final        Discharge Instructions     Amb Referral to Cardiac Rehabilitation   Complete by: As directed    Diagnosis:  CABG STEMI PTCA     CABG X ___: 3   After initial evaluation and assessments completed: Virtual Based Care may be provided alone or in conjunction with Phase 2 Cardiac Rehab based on patient barriers.: Yes       Discharge Medications: Allergies as of 01/30/2021   No Known Allergies      Medication List     STOP taking these medications    Fish  Oil 1000 MG Caps   losartan 25 MG tablet Commonly known as: COZAAR   losartan 50 MG tablet Commonly known as: COZAAR       TAKE these medications    amiodarone 200 MG tablet Commonly known as: PACERONE Take 1 tablet (200 mg total) by mouth 2 (two) times daily.    aspirin EC 81 MG tablet Take 1 tablet (81 mg total) by mouth daily. Swallow whole.   atorvastatin 80 MG tablet Commonly known as: LIPITOR Take 1 tablet (80 mg total) by mouth daily. What changed:  medication strength how much to take   carvedilol 6.25 MG tablet Commonly known as: COREG Take 1 tablet (6.25 mg total) by mouth 2 (two) times daily with a meal.   clopidogrel 75 MG tablet Commonly known as: Plavix Take 1 tablet (75 mg total) by mouth daily.   furosemide 40 MG tablet Commonly known as: LASIX Take 1 tablet (40 mg total) by mouth daily.   melatonin 3 MG Tabs tablet Take 1 tablet (3 mg total) by mouth at bedtime.   primidone 50 MG tablet Commonly known as: MYSOLINE Take 1/2 tablet at bedtime for 1 week, then increase to 1 tablet at bedtime What changed:  how much to take how to take this when to take this additional instructions   traMADol 50 MG tablet Commonly known as: ULTRAM Take 1 tablet (50 mg total) by mouth every 6 (six) hours as needed for up to 5 days for moderate pain.               Durable Medical Equipment  (From admission, onward)           Start     Ordered   01/25/21 1551  For home use only DME 4 wheeled rolling walker with seat  Once       Question Answer Comment  Patient needs a walker to treat with the following condition S/P CABG (coronary artery bypass graft)   Patient needs a walker to treat with the following condition Physical deconditioning      01/25/21 1550   01/25/21 1551  For home use only DME Hospital bed  Once       Question Answer Comment  Length of Need 6 Months   Patient has (list medical condition): s/p cabg   The above medical condition requires: Patient requires the ability to reposition frequently   Bed type Semi-electric      01/25/21 1550          The patient has been discharged on:   1.Beta Blocker:  Yes [ x  ]                              No   [   ]                              If No,  reason:  2.Ace Inhibitor/ARB: Yes [   ]                                     No  [  x  ]  If No, reason: renal insufficiency  3.Statin:   Yes [  x ]                  No  [   ]                  If No, reason:  4.Ecasa:  Yes  [  x ]                  No   [   ]                  If No, reason:  Patient had ACS upon admission:  Plavix/P2Y12 inhibitor: Yes [ x  ]                                      No  [   ]   Follow Up Appointments:  Follow-up Information     [name], MD. Call.   Specialty: Family Medicine Why: for a follow up regarding further surveillance of HGA1C 6 (pre diabetes) Contact information: [address] [place] [address] [phone]         [name], MD. Go on 02/21/2021.   Specialty: Cardiothoracic Surgery Why: PA/ALT CXR to be taken (at [facility] which is in the same building as Dr. Sunday Corn office) 30 minutes prior to office appointment;Appointment time is at 10:45 am Contact information: [address] [place] [address] [phone]         Ronney Asters, NP. Go on 02/07/2021.   Specialty: Cardiology Why: Appointment time is at 10:15 am Contact information: [address] [place] [address] [phone]                 Signed: Carl Best 01/30/2021, 7:24 AM

## 2021-01-20 NOTE — Procedures (Signed)
Extubation Procedure Note  Patient Details:   Name: Timothy Munoz DOB: 07-31-1945 MRN: [id number]   Airway Documentation:  Airway 8 mm (Active)  Secured at (cm) 22 cm 01/20/21 1742  Measured From Lips 01/20/21 1742  Secured Location Right 01/20/21 1742  Secured By Pink Tape 01/20/21 1742  Site Condition Dry 01/20/21 1742   Vent end date: (not recorded) Vent end time: (not recorded)   Evaluation  O2 sats: stable throughout Complications: No apparent complications Patient did tolerate procedure well. Bilateral Breath Sounds: Clear, Diminished   Yes  Pt extubated to 4L [place], positive cuff leak noted, no stridor heard. Pts vital capacity was , NIF -25 with good effort. Pt performed 250 on incentive with good effort as well. RT will continue to monitor.   Memory Argue 01/20/2021, 6:27 PM

## 2021-01-21 ENCOUNTER — Inpatient Hospital Stay (HOSPITAL_COMMUNITY): Payer: Medicare HMO

## 2021-01-21 DIAGNOSIS — I517 Cardiomegaly: Secondary | ICD-10-CM | POA: Diagnosis not present

## 2021-01-21 DIAGNOSIS — J90 Pleural effusion, not elsewhere classified: Secondary | ICD-10-CM | POA: Diagnosis not present

## 2021-01-21 DIAGNOSIS — I10 Essential (primary) hypertension: Secondary | ICD-10-CM | POA: Diagnosis not present

## 2021-01-21 LAB — BASIC METABOLIC PANEL
Anion gap: 8 (ref 5–15)
Anion gap: 8 (ref 5–15)
BUN: 32 mg/dL — ABNORMAL HIGH (ref 8–23)
BUN: 37 mg/dL — ABNORMAL HIGH (ref 8–23)
CO2: 20 mmol/L — ABNORMAL LOW (ref 22–32)
CO2: 21 mmol/L — ABNORMAL LOW (ref 22–32)
Calcium: 8.2 mg/dL — ABNORMAL LOW (ref 8.9–10.3)
Calcium: 7.7 mg/dL — ABNORMAL LOW (ref 8.9–10.3)
Chloride: 108 mmol/L (ref 98–111)
Chloride: 105 mmol/L (ref 98–111)
Creatinine, Ser: 1.46 mg/dL — ABNORMAL HIGH (ref 0.61–1.24)
Creatinine, Ser: 1.72 mg/dL — ABNORMAL HIGH (ref 0.61–1.24)
GFR, Estimated: 50 mL/min — ABNORMAL LOW (ref >60)
GFR, Estimated: 41 mL/min — ABNORMAL LOW (ref >60)
Glucose, Bld: 151 mg/dL — ABNORMAL HIGH (ref 70–99)
Glucose, Bld: 167 mg/dL — ABNORMAL HIGH (ref 70–99)
Potassium: 4.5 mmol/L (ref 3.5–5.1)
Potassium: 4.7 mmol/L (ref 3.5–5.1)
Sodium: 136 mmol/L (ref 135–145)
Sodium: 134 mmol/L — ABNORMAL LOW (ref 135–145)

## 2021-01-21 LAB — GLUCOSE, CAPILLARY
Glucose-Capillary: 115 mg/dL — ABNORMAL HIGH (ref 70–99)
Glucose-Capillary: 118 mg/dL — ABNORMAL HIGH (ref 70–99)
Glucose-Capillary: 124 mg/dL — ABNORMAL HIGH (ref 70–99)
Glucose-Capillary: 128 mg/dL — ABNORMAL HIGH (ref 70–99)
Glucose-Capillary: 134 mg/dL — ABNORMAL HIGH (ref 70–99)
Glucose-Capillary: 135 mg/dL — ABNORMAL HIGH (ref 70–99)
Glucose-Capillary: 135 mg/dL — ABNORMAL HIGH (ref 70–99)
Glucose-Capillary: 136 mg/dL — ABNORMAL HIGH (ref 70–99)
Glucose-Capillary: 140 mg/dL — ABNORMAL HIGH (ref 70–99)
Glucose-Capillary: 144 mg/dL — ABNORMAL HIGH (ref 70–99)
Glucose-Capillary: 148 mg/dL — ABNORMAL HIGH (ref 70–99)
Glucose-Capillary: 148 mg/dL — ABNORMAL HIGH (ref 70–99)
Glucose-Capillary: 148 mg/dL — ABNORMAL HIGH (ref 70–99)
Glucose-Capillary: 151 mg/dL — ABNORMAL HIGH (ref 70–99)
Glucose-Capillary: 152 mg/dL — ABNORMAL HIGH (ref 70–99)
Glucose-Capillary: 154 mg/dL — ABNORMAL HIGH (ref 70–99)
Glucose-Capillary: 163 mg/dL — ABNORMAL HIGH (ref 70–99)

## 2021-01-21 LAB — CBC
HCT: 30.5 % — ABNORMAL LOW (ref 39.0–52.0)
HCT: 29.7 % — ABNORMAL LOW (ref 39.0–52.0)
Hemoglobin: 10.4 g/dL — ABNORMAL LOW (ref 13.0–17.0)
Hemoglobin: 9.7 g/dL — ABNORMAL LOW (ref 13.0–17.0)
MCH: 29.9 pg (ref 26.0–34.0)
MCH: 29.7 pg (ref 26.0–34.0)
MCHC: 34.1 g/dL (ref 30.0–36.0)
MCHC: 32.7 g/dL (ref 30.0–36.0)
MCV: 87.6 fL (ref 80.0–100.0)
MCV: 90.8 fL (ref 80.0–100.0)
Platelets: 158 10*3/uL (ref 150–400)
Platelets: 150 10*3/uL (ref 150–400)
RBC: 3.48 MIL/uL — ABNORMAL LOW (ref 4.22–5.81)
RBC: 3.27 MIL/uL — ABNORMAL LOW (ref 4.22–5.81)
RDW: 13.6 % (ref 11.5–15.5)
RDW: 13.8 % (ref 11.5–15.5)
WBC: 15.2 10*3/uL — ABNORMAL HIGH (ref 4.0–10.5)
WBC: 13.2 10*3/uL — ABNORMAL HIGH (ref 4.0–10.5)
nRBC: 0 % (ref 0.0–0.2)
nRBC: 0 % (ref 0.0–0.2)

## 2021-01-21 LAB — COOXEMETRY PANEL
Carboxyhemoglobin: 1.4 % (ref 0.5–1.5)
Methemoglobin: 0.8 % (ref 0.0–1.5)
O2 Saturation: 59.9 %
Total hemoglobin: 10.4 g/dL — ABNORMAL LOW (ref 12.0–16.0)

## 2021-01-21 LAB — MAGNESIUM
Magnesium: 2.8 mg/dL — ABNORMAL HIGH (ref 1.7–2.4)
Magnesium: 2.5 mg/dL — ABNORMAL HIGH (ref 1.7–2.4)

## 2021-01-21 MED ORDER — INSULIN ASPART 100 UNIT/ML IJ SOLN
0.0000 [IU] | INTRAMUSCULAR | Status: DC
  Administered 2021-01-21 (×3): 2 [IU] via SUBCUTANEOUS
  Administered 2021-01-22: 4 [IU] via SUBCUTANEOUS

## 2021-01-21 MED ORDER — ENOXAPARIN SODIUM 40 MG/0.4ML IJ SOSY
40.0000 mg | PREFILLED_SYRINGE | Freq: Every day | INTRAMUSCULAR | Status: DC
  Administered 2021-01-21 – 2021-01-29 (×9): 40 mg via SUBCUTANEOUS
  Filled 2021-01-21 (×9): qty 0.4

## 2021-01-21 MED ORDER — INSULIN DETEMIR 100 UNIT/ML ~~LOC~~ SOLN
30.0000 [IU] | Freq: Once | SUBCUTANEOUS | Status: AC
  Administered 2021-01-21: 30 [IU] via SUBCUTANEOUS
  Filled 2021-01-21: qty 0.3

## 2021-01-21 MED ORDER — INSULIN DETEMIR 100 UNIT/ML ~~LOC~~ SOLN
20.0000 [IU] | Freq: Every day | SUBCUTANEOUS | Status: DC
  Administered 2021-01-22: 20 [IU] via SUBCUTANEOUS
  Filled 2021-01-21 (×2): qty 0.2

## 2021-01-21 NOTE — Progress Notes (Signed)
Patient ID: Timothy Munoz, male   DOB: 03-24-1946, 75 y.o.   MRN: [id number] TCTS Evening Rounds:  Weaned off NE today. BP borderline so will hold BB and watch closely. He is still on milrinone 0.125.  Right groin ok after IABP removal. Sitting up in chair.  CBC    Component Value Date/Time   WBC 13.2 (H) 01/21/2021 1700   RBC 3.27 (L) 01/21/2021 1700   HGB 9.7 (L) 01/21/2021 1700   HCT 29.7 (L) 01/21/2021 1700   PLT 150 01/21/2021 1700   MCV 90.8 01/21/2021 1700   MCH 29.7 01/21/2021 1700   MCHC 32.7 01/21/2021 1700   RDW 13.8 01/21/2021 1700   BMET pending this pm.

## 2021-01-21 NOTE — Anesthesia Postprocedure Evaluation (Signed)
Anesthesia Post Note  Patient: Timothy Munoz  Procedure(s) Performed: CORONARY ARTERY BYPASS GRAFTING (CABG) X 3 ON PUMP, USING LEFT INTERNAL MAMMARY ARTERY AND LEFT ENDOSCOPIC GREATER SAPHENOUS VEIN HARVEST CONDUITS (Chest) TRANSESOPHAGEAL ECHOCARDIOGRAM (TEE) APPLICATION OF CELL SAVER ENDOVEIN HARVEST OF GREATER SAPHENOUS VEIN (Left)     Patient location during evaluation: ICU Anesthesia Type: General Level of consciousness: awake Pain management: pain level controlled Vital Signs Assessment: post-procedure vital signs reviewed and stable Respiratory status: spontaneous breathing, nonlabored ventilation and respiratory function stable Cardiovascular status: blood pressure returned to baseline and stable Postop Assessment: no apparent nausea or vomiting Anesthetic complications: no   No notable events documented.  Last Vitals:  Vitals:   01/21/21 0300 01/21/21 0400  BP: 95/69 99/69  Pulse:    Resp: (!) 33 (!) 22  Temp: 37.6 C 37.5 C  SpO2: 95% 95%    Last Pain:  Vitals:   01/21/21 0400  TempSrc: Core  PainSc: 2                  [name]

## 2021-01-21 NOTE — Plan of Care (Signed)

## 2021-01-21 NOTE — Op Note (Signed)
NAME: Timothy Munoz, VANDYNE MEDICAL RECORD NO: [id number] ACCOUNT NO: [id number] DATE OF BIRTH: 1945-12-29 FACILITY: [facility] LOCATION: [facility] PHYSICIAN: Salvatore Decent. Dorris Fetch, MD  Operative Report   DATE OF PROCEDURE: 01/20/2021  PREOPERATIVE DIAGNOSIS:  Severe 2-vessel coronary artery disease, status post ST elevation myocardial infarction.  POSTOPERATIVE DIAGNOSIS:  Severe 2-vessel coronary artery disease, status post ST elevation myocardial infarction.  PROCEDURE PERFORMED:   Median sternotomy, extracorporeal circulation,  Coronary artery bypass grafting x3  Left internal mammary artery to left anterior descending,  Saphenous vein graft to ramus intermedius,  Saphenous vein graft to obtuse marginal 1,  Endoscopic vein harvest, left thigh.  SURGEON:  Charlett Lango, MD  ASSISTANT:  Doree Fudge, PA.  ANESTHESIA:  General.  FINDINGS: Transesophageal echocardiography revealed mild mitral regurgitation pre and post-bypass, mild tricuspid regurgitation post-bypass, initial elevated pulmonary artery pressures and septal akinesis which resolved in the early post-bypass period.   Good quality targets and good quality conduits.  CLINICAL NOTE:  Timothy Munoz is a 75 year old man who presented with an ST elevation myocardial infarction due to total occlusion of his LAD.  He had angioplasty which reopened the vessel, but had residual significant disease in the LAD and circumflex  with a severe ostial circumflex lesion and moderate disease at the takeoff of the ramus intermedius branch.  He was managed medically initially and is now felt ready to proceed with bypass graft for survival benefit and relief of symptoms.  The  indications, risks, benefits, and alternatives were discussed in detail with the patient.  He understood and accepted the risks and agreed to proceed.  OPERATIVE NOTE:  Timothy Munoz was brought directly from the ICU to the operating room on 01/20/2021.  An intraaortic  balloon pump was in place at 1:1.  Anesthesia placed a Swan-Ganz catheter and an arterial blood pressure monitoring line.  He was  anesthetized and intubated.  A Foley catheter was placed.  Intravenous antibiotics were administered.  Transesophageal echocardiography was performed by Dr. Bradley Ferris of anesthesia.  Please see his separately dictated note for full details.  There was  some septal hypokinesis and apical hypokinesis.  There was mild mitral regurgitation.  The chest, abdomen and legs were prepped and draped in the usual sterile fashion.  A timeout was performed.  A median sternotomy was performed.  The left internal mammary artery was harvested using standard technique.  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  An incision was made in the medial aspect of the left leg at the level of  the knee.  The greater saphenous vein was harvested from the left thigh endoscopically.  The saphenous vein was excellent quality.  2000 units of heparin was administered during the vessel harvest.  The remainder of the full heparin dose was given prior  to opening the pericardium.  The left internal mammary artery was divided distally.  There was excellent flow and it was a good quality vessel as well.  The pericardium was opened.  The ascending aorta was inspected.  It was slightly enlarged at about 4.2 cm.  There was no evidence of atherosclerotic disease.  The aorta was cannulated via concentric 2-0 Ethibond pledgeted pursestring sutures.  A dual  stage venous cannula was placed via a pursestring suture in the right atrial appendage.  Cardiopulmonary bypass was initiated.  Flows were maintained per protocol.  The coronary arteries were inspected and anastomotic sites were chosen.  The conduits  were inspected and cut to length.  A foam pad was placed in the  pericardium to insulate the heart.  A temperature probe was placed in the myocardial septum and the cardioplegia cannula was placed in the ascending  aorta.  There was significant hemorrhage and edema in the anterior apical wall noted on the epicardial surface.  The aorta was crossclamped.  The left ventricle was emptied via the aortic root vent.  Cardiac arrest then was achieved with a combination of cold antegrade and retrograde blood cardioplegia and topical iced saline.  Initially, a liter of antegrade  cardioplegia was administered.  There was a rapid diastolic arrest but relatively slow septal cooling that only achieved 17 degrees Celsius.  A retrograde cardioplegia cannula was placed via pursestring suture in the right atrium and directed into the  coronary sinus.  An additional liter of cardioplegia was administered via the retrograde cannula and there was septal cooling to 10 degrees Celsius.  A reversed saphenous vein graft was placed end-to-side to the first obtuse marginal branch of the circumflex.  This was a dominant lateral wall branch.  It was a 1.5 mm good quality target, it was superficially intramyocardial.  The vein was of good  quality.  It was anastomosed end-to-side with a running 7-0 Prolene suture.  All anastomoses were probed proximally and distally at their completion to assess patency.  Cardioplegia was administered down the graft and there was good flow and good  hemostasis.  Next, a reversed saphenous vein graft was placed end-to-side to the ramus intermedius branch.  This vessel had significant calcium proximally, but only about a 50% ostial stenosis.  There was some plaque more distally in the vessel.  The vessel was more  deeply intramyocardial, but was of good quality at the site of the anastomosis.  The vein was anastomosed end-to-side with a running 7-0 Prolene suture.  Again, a probe passed easily proximally and distally and cardioplegia was administered with good  flow and good hemostasis.  Additional cardioplegia was administered retrograde.  The LAD was dissected out on the anterior wall.  There was  significant edema around the vessel.  The LAD itself was a 1.5 mm good quality target at the site of the anastomosis.  The left internal  mammary artery was brought through a window in the pericardium.  The distal end was beveled.  It was anastomosed end-to-side to the LAD with a running 8-0 Prolene suture.  After completion of the mammary to LAD anastomosis, the bulldog clamp was briefly  removed to inspect for hemostasis.  Rapid septal rewarming was noted.  The bulldog clamp was replaced and the mammary pedicle was tacked to the epicardial surface of the heart with 6-0 Prolene sutures.  Additional cardioplegia was administered down  the vein grafts and the retrograde cannula.  The vein grafts were cut to length.  The cardioplegia cannula was removed from the ascending aorta.  The proximal vein graft anastomoses were performed to 4.5 mm punch aortotomies with running 6-0 Prolene  sutures.  At the completion of the final proximal anastomosis, the patient was placed in Trendelenburg position.  Lidocaine was administered. The aortic root was deaired.  The aortic crossclamp was removed.  The total crossclamp time was 83 minutes.  The  patient spontaneously resumed sinus rhythm and did not require defibrillation. While rewarming was completed, all proximal and distal anastomoses were inspected for hemostasis.  Epicardial pacing wires were placed on the right ventricle and right  atrium.  When the patient had rewarmed to a core temperature of 37 degrees Celsius, he was weaned  from cardiopulmonary bypass on the first attempt.  Total bypass time was 125 minutes.  Initial cardiac index was about 1.7 liters per minute per meter  squared.  Initially, his pulmonary artery pressures were high. Initially as the bypass flows were decreased, there was anterior and septal akinesis.  He was placed back on full bypass.  Low dose milrinone and norepinephrine infusions were initiated.   He was atrially paced at 80 beats per  minute.  He then weaned from bypass on the first attempt.  The initial cardiac index was about 1.7 liters per minute per meter squared.  Initially, the pulmonary artery pressures were high.  There was some mild  tricuspid regurgitation noted on echocardiogram.  Over the next 15-20 minutes, the pulmonary artery pressures normalized and his cardiac output and index improved with a cardiac index of about 2.5 liters per minute per meter squared.  A test dose of protamine was administered and was well tolerated.  The atrial and aortic cannula were removed.  The remainder of the protamine was administered without incident.  The chest was irrigated with warm saline.  Hemostasis was achieved.  Left  pleural and mediastinal chest tubes were placed through separate subcostal incisions and secured with #1 silk sutures.  The pericardium was not closed.  The sternum was closed with a combination of single and double heavy gauge stainless steel wires.   The pectoralis fascia, subcutaneous tissue and skin were closed in standard fashion.  All sponge, needle and instrument counts were correct at the end of the procedure. The patient was taken from the operating room to the surgical intensive care unit,  intubated and in good condition.   SHW D: 01/20/2021 6:39:57 pm T: 01/21/2021 3:15:00 am  JOB: [id number]/ [id number]

## 2021-01-21 NOTE — Progress Notes (Signed)
EKG CRITICAL VALUE     12 lead EKG performed.  Critical value noted.  Simmie Davies, RN notified.   Edmonia Caprio, CCT 01/21/2021 9:51 AM

## 2021-01-21 NOTE — Plan of Care (Signed)

## 2021-01-21 NOTE — Progress Notes (Signed)
1 Day Post-Op Procedure(s) (LRB): CORONARY ARTERY BYPASS GRAFTING (CABG) X 3 ON PUMP, USING LEFT INTERNAL MAMMARY ARTERY AND LEFT ENDOSCOPIC GREATER SAPHENOUS VEIN HARVEST CONDUITS (N/A) TRANSESOPHAGEAL ECHOCARDIOGRAM (TEE) (N/A) APPLICATION OF CELL SAVER ENDOVEIN HARVEST OF GREATER SAPHENOUS VEIN (Left) Subjective: Uncomfortable from having to lay on his back for so long but otherwise feels ok.  Objective: Vital signs in last 24 hours: Temp:  [97.5 F (36.4 C)-100.6 F (38.1 C)] 99.9 F (37.7 C) (09/10 1030) Pulse Rate:  [86-253] 105 (09/10 1030) Cardiac Rhythm: Normal sinus rhythm (09/10 0800) Resp:  [12-36] 31 (09/10 1030) BP: (86-139)/(39-90) 120/61 (09/10 1000) SpO2:  [84 %-100 %] 92 % (09/10 1030) Arterial Line BP: (72-223)/(32-215) 102/56 (09/10 1030) FiO2 (%):  [40 %-100 %] 40 % (09/09 1742) Weight:  [122.1 kg] 122.1 kg (09/10 0500)  Hemodynamic parameters for last 24 hours: PAP: (24-44)/(10-23) 33/21 CO:  [4.7 L/min-7 L/min] 5.7 L/min CI:  [1.9 L/min/m2-2.9 L/min/m2] 2.3 L/min/m2  Intake/Output from previous day: 09/09 0701 - 09/10 0700 In: 5220.2 [P.O.:200; I.V.:2876.5; Blood:640; IV Piggyback:1503.7] Out: 3249 [Urine:1795; DEYCX:4481; Chest Tube:290] Intake/Output this shift: Total I/O In: 396 [I.V.:221.1; IV Piggyback:174.9] Out: 195 [Urine:165; Chest Tube:30]  General appearance: alert and cooperative Neurologic: intact Heart: regular rate and rhythm, S1, S2 normal, no murmur Lungs: clear to auscultation bilaterally Abdomen: soft, non-tender; bowel sounds normal  Extremities: edema moderate anasarca Wound: dressing dry  Lab Results: Recent Labs    01/20/21 1959 01/20/21 2002 01/21/21 0305  WBC 12.8*  --  15.2*  HGB 11.1* 10.5* 10.4*  HCT 33.6* 31.0* 30.5*  PLT 163  --  158   BMET:  Recent Labs    01/20/21 1959 01/20/21 2002 01/21/21 0305  NA 134* 137 136  K 4.8 4.9 4.5  CL 108  --  108  CO2 20*  --  20*  GLUCOSE 181*  --  151*  BUN 30*   --  32*  CREATININE 1.34*  --  1.46*  CALCIUM 8.0*  --  8.2*    PT/INR:  Recent Labs    01/20/21 1400  LABPROT 17.1*  INR 1.4*   ABG    Component Value Date/Time   PHART 7.357 01/20/2021 2002   HCO3 20.9 01/20/2021 2002   TCO2 22 01/20/2021 2002   ACIDBASEDEF 4.0 (H) 01/20/2021 2002   O2SAT 59.9 01/21/2021 0314   CBG (last 3)  Recent Labs    01/21/21 0757 01/21/21 0857 01/21/21 0958  GLUCAP 124* 152* 144*   CXR: clear  ECG: sinus, anterolateral ST elevation present from preop.  Assessment/Plan: S/P Procedure(s) (LRB): CORONARY ARTERY BYPASS GRAFTING (CABG) X 3 ON PUMP, USING LEFT INTERNAL MAMMARY ARTERY AND LEFT ENDOSCOPIC GREATER SAPHENOUS VEIN HARVEST CONDUITS (N/A) TRANSESOPHAGEAL ECHOCARDIOGRAM (TEE) (N/A) APPLICATION OF CELL SAVER ENDOVEIN HARVEST OF GREATER SAPHENOUS VEIN (Left)  POD 1  Hemodynamically stable on milrinone 0.25 and low dose NE with IABP weaned early this am. I personally removed the IABP and pressure held by me for 20 minutes. Hemostatic and right foot warm, palpable pedal pulses. Decrease milrinone to 0.125. wean off later and wean NE as BP allows. Hold beta blocker until stable off pressors.  Volume excess: anasarca and wt recorded 14 lbs over preop ( probably more). Will start diuresis once off NE.  DC chest tubes later today after dangle.  DM: transition to Levemir and SSI.  IS, OOB after 6 hrs.  Continue ASA and probably add Plavix later once pacing wires out.   LOS: 5 days  Alleen Borne 01/21/2021

## 2021-01-22 ENCOUNTER — Inpatient Hospital Stay (HOSPITAL_COMMUNITY): Payer: Medicare HMO

## 2021-01-22 LAB — BASIC METABOLIC PANEL
Anion gap: 9 (ref 5–15)
Anion gap: 9 (ref 5–15)
BUN: 44 mg/dL — ABNORMAL HIGH (ref 8–23)
BUN: 48 mg/dL — ABNORMAL HIGH (ref 8–23)
CO2: 22 mmol/L (ref 22–32)
CO2: 23 mmol/L (ref 22–32)
Calcium: 8.2 mg/dL — ABNORMAL LOW (ref 8.9–10.3)
Calcium: 8.1 mg/dL — ABNORMAL LOW (ref 8.9–10.3)
Chloride: 102 mmol/L (ref 98–111)
Chloride: 101 mmol/L (ref 98–111)
Creatinine, Ser: 1.49 mg/dL — ABNORMAL HIGH (ref 0.61–1.24)
Creatinine, Ser: 1.4 mg/dL — ABNORMAL HIGH (ref 0.61–1.24)
GFR, Estimated: 49 mL/min — ABNORMAL LOW (ref >60)
GFR, Estimated: 53 mL/min — ABNORMAL LOW (ref >60)
Glucose, Bld: 147 mg/dL — ABNORMAL HIGH (ref 70–99)
Glucose, Bld: 147 mg/dL — ABNORMAL HIGH (ref 70–99)
Potassium: 4.3 mmol/L (ref 3.5–5.1)
Potassium: 4.3 mmol/L (ref 3.5–5.1)
Sodium: 133 mmol/L — ABNORMAL LOW (ref 135–145)
Sodium: 133 mmol/L — ABNORMAL LOW (ref 135–145)

## 2021-01-22 LAB — COOXEMETRY PANEL
Carboxyhemoglobin: 1.3 % (ref 0.5–1.5)
Methemoglobin: 1.1 % (ref 0.0–1.5)
O2 Saturation: 57.3 %
Total hemoglobin: 8.9 g/dL — ABNORMAL LOW (ref 12.0–16.0)

## 2021-01-22 LAB — GLUCOSE, CAPILLARY
Glucose-Capillary: 113 mg/dL — ABNORMAL HIGH (ref 70–99)
Glucose-Capillary: 128 mg/dL — ABNORMAL HIGH (ref 70–99)
Glucose-Capillary: 128 mg/dL — ABNORMAL HIGH (ref 70–99)
Glucose-Capillary: 130 mg/dL — ABNORMAL HIGH (ref 70–99)
Glucose-Capillary: 131 mg/dL — ABNORMAL HIGH (ref 70–99)
Glucose-Capillary: 134 mg/dL — ABNORMAL HIGH (ref 70–99)
Glucose-Capillary: 171 mg/dL — ABNORMAL HIGH (ref 70–99)

## 2021-01-22 LAB — CBC
HCT: 27.1 % — ABNORMAL LOW (ref 39.0–52.0)
Hemoglobin: 8.8 g/dL — ABNORMAL LOW (ref 13.0–17.0)
MCH: 29.7 pg (ref 26.0–34.0)
MCHC: 32.5 g/dL (ref 30.0–36.0)
MCV: 91.6 fL (ref 80.0–100.0)
Platelets: 136 10*3/uL — ABNORMAL LOW (ref 150–400)
RBC: 2.96 MIL/uL — ABNORMAL LOW (ref 4.22–5.81)
RDW: 14.1 % (ref 11.5–15.5)
WBC: 11.9 10*3/uL — ABNORMAL HIGH (ref 4.0–10.5)
nRBC: 0 % (ref 0.0–0.2)

## 2021-01-22 MED ORDER — AMIODARONE HCL IN DEXTROSE 360-4.14 MG/200ML-% IV SOLN
30.0000 mg/h | INTRAVENOUS | Status: AC
  Administered 2021-01-23: 30 mg/h via INTRAVENOUS
  Filled 2021-01-22: qty 200

## 2021-01-22 MED ORDER — INSULIN ASPART 100 UNIT/ML IJ SOLN
0.0000 [IU] | Freq: Three times a day (TID) | INTRAMUSCULAR | Status: DC
  Administered 2021-01-22 – 2021-01-26 (×5): 2 [IU] via SUBCUTANEOUS

## 2021-01-22 MED ORDER — FUROSEMIDE 10 MG/ML IJ SOLN: 40.0000 mg | Freq: Once | INTRAMUSCULAR | Status: AC

## 2021-01-22 MED ORDER — AMIODARONE LOAD VIA INFUSION: 150.0000 mg | Freq: Once | INTRAVENOUS | Status: AC

## 2021-01-22 MED ORDER — FUROSEMIDE 10 MG/ML IJ SOLN
INTRAMUSCULAR | Status: AC
  Administered 2021-01-22: 40 mg
  Filled 2021-01-22: qty 4

## 2021-01-22 MED ORDER — AMIODARONE HCL IN DEXTROSE 360-4.14 MG/200ML-% IV SOLN
60.0000 mg/h | INTRAVENOUS | Status: AC
  Administered 2021-01-22: 60 mg/h via INTRAVENOUS

## 2021-01-22 MED ORDER — FUROSEMIDE 10 MG/ML IJ SOLN
40.0000 mg | Freq: Once | INTRAMUSCULAR | Status: AC
  Administered 2021-01-22: 40 mg via INTRAVENOUS
  Filled 2021-01-22: qty 4

## 2021-01-22 MED ORDER — AMIODARONE HCL IN DEXTROSE 360-4.14 MG/200ML-% IV SOLN
INTRAVENOUS | Status: AC
  Administered 2021-01-22: 150 mg via INTRAVENOUS
  Filled 2021-01-22: qty 200

## 2021-01-22 NOTE — Progress Notes (Signed)
2 Days Post-Op Procedure(s) (LRB): CORONARY ARTERY BYPASS GRAFTING (CABG) X 3 ON PUMP, USING LEFT INTERNAL MAMMARY ARTERY AND LEFT ENDOSCOPIC GREATER SAPHENOUS VEIN HARVEST CONDUITS (N/A) TRANSESOPHAGEAL ECHOCARDIOGRAM (TEE) (N/A) APPLICATION OF CELL SAVER ENDOVEIN HARVEST OF GREATER SAPHENOUS VEIN (Left) Subjective: No specific complaints. Ambulated a little in hall.  Remains on milrinone 0.125 with Co-ox 57%.  Objective: Vital signs in last 24 hours: Temp:  [97.2 F (36.2 C)-100 F (37.8 C)] 97.2 F (36.2 C) (09/11 0807) Pulse Rate:  [80-105] 93 (09/11 0800) Cardiac Rhythm: Normal sinus rhythm (09/11 0800) Resp:  [12-33] 26 (09/11 0800) BP: (94-134)/(43-83) 112/54 (09/11 0800) SpO2:  [91 %-98 %] 95 % (09/11 0800) Arterial Line BP: (94-129)/(50-58) 98/50 (09/10 1600) Weight:  [124.2 kg] 124.2 kg (09/11 0500)  Hemodynamic parameters for last 24 hours: PAP: (32-38)/(11-16) 32/16 CO:  [4.9 L/min-5.7 L/min] 4.9 L/min CI:  [2 L/min/m2-2.4 L/min/m2] 2 L/min/m2  Intake/Output from previous day: 09/10 0701 - 09/11 0700 In: 964.8 [P.O.:220; I.V.:408.8; IV Piggyback:336] Out: 1110 [Urine:1020; Chest Tube:90] Intake/Output this shift: Total I/O In: 240 [P.O.:240] Out: -   General appearance: alert and cooperative Neurologic: intact Heart: regular rate and rhythm, S1, S2 normal, no murmur Lungs: diminished breath sounds bibasilar Extremities: moderate anasarca Wound: incision ok  Lab Results: Recent Labs    01/21/21 1700 01/22/21 0511  WBC 13.2* 11.9*  HGB 9.7* 8.8*  HCT 29.7* 27.1*  PLT 150 136*   BMET:  Recent Labs    01/21/21 1700 01/22/21 0511  NA 134* 133*  K 4.7 4.3  CL 105 102  CO2 21* 22  GLUCOSE 167* 147*  BUN 37* 44*  CREATININE 1.72* 1.49*  CALCIUM 7.7* 8.2*    PT/INR:  Recent Labs    01/20/21 1400  LABPROT 17.1*  INR 1.4*   ABG    Component Value Date/Time   PHART 7.357 01/20/2021 2002   HCO3 20.9 01/20/2021 2002   TCO2 22 01/20/2021  2002   ACIDBASEDEF 4.0 (H) 01/20/2021 2002   O2SAT 57.3 01/22/2021 0515   CBG (last 3)  Recent Labs    01/22/21 0003 01/22/21 0405 01/22/21 0747  GLUCAP 134* 131* 171*   CXR: patchy consolidation of left base, likely atelectasis.  Assessment/Plan: S/P Procedure(s) (LRB): CORONARY ARTERY BYPASS GRAFTING (CABG) X 3 ON PUMP, USING LEFT INTERNAL MAMMARY ARTERY AND LEFT ENDOSCOPIC GREATER SAPHENOUS VEIN HARVEST CONDUITS (N/A) TRANSESOPHAGEAL ECHOCARDIOGRAM (TEE) (N/A) APPLICATION OF CELL SAVER ENDOVEIN HARVEST OF GREATER SAPHENOUS VEIN (Left)  POD 2 Hemodynamically stable on milrinone 0.125. Co-ox is marginal so will continue today. Hold off on beta blocker for now.  Volume excess: wt up 5 lbs from yesterday although I/O slightly negative. He is about 18 lbs over preop. Creat improved today so will start diuresis.  DM: glucose under good control on levemir and SSI.  IS, flutter valve, ambulation.   LOS: 6 days    Timothy Munoz 01/22/2021

## 2021-01-22 NOTE — Plan of Care (Signed)
  Problem: Education: Goal: Knowledge of General Education information will improve Description: Including pain rating scale, medication(s)/side effects and non-pharmacologic comfort measures Outcome: Progressing   Problem: Health Behavior/Discharge Planning: Goal: Ability to manage health-related needs will improve Outcome: Progressing   Problem: Clinical Measurements: Goal: Ability to maintain clinical measurements within normal limits will improve Outcome: Progressing Goal: Will remain free from infection Outcome: Progressing Goal: Diagnostic test results will improve Outcome: Progressing Goal: Respiratory complications will improve Outcome: Progressing Goal: Cardiovascular complication will be avoided Outcome: Progressing   Problem: Activity: Goal: Risk for activity intolerance will decrease Outcome: Progressing   Problem: Nutrition: Goal: Adequate nutrition will be maintained Outcome: Progressing   Problem: Coping: Goal: Level of anxiety will decrease Outcome: Progressing   Problem: Safety: Goal: Ability to remain free from injury will improve Outcome: Progressing   Problem: Pain Managment: Goal: General experience of comfort will improve Outcome: Progressing   Problem: Skin Integrity: Goal: Risk for impaired skin integrity will decrease Outcome: Progressing

## 2021-01-23 ENCOUNTER — Inpatient Hospital Stay (HOSPITAL_COMMUNITY): Payer: Medicare HMO

## 2021-01-23 ENCOUNTER — Encounter (HOSPITAL_COMMUNITY): Payer: Self-pay | Admitting: Thoracic Surgery (Cardiothoracic Vascular Surgery)

## 2021-01-23 LAB — TYPE AND SCREEN
ABO/RH(D): O POS
Antibody Screen: NEGATIVE
Unit division: 0
Unit division: 0
Unit division: 0
Unit division: 0

## 2021-01-23 LAB — BPAM RBC
Blood Product Expiration Date: 202210122359
Blood Product Expiration Date: 202210122359
Blood Product Expiration Date: 202210122359
Blood Product Expiration Date: 202210122359
ISSUE DATE / TIME: 202209090918
ISSUE DATE / TIME: 202209090918
Unit Type and Rh: 5100
Unit Type and Rh: 5100
Unit Type and Rh: 5100
Unit Type and Rh: 5100

## 2021-01-23 LAB — CBC
HCT: 24.2 % — ABNORMAL LOW (ref 39.0–52.0)
Hemoglobin: 7.8 g/dL — ABNORMAL LOW (ref 13.0–17.0)
MCH: 29.2 pg (ref 26.0–34.0)
MCHC: 32.2 g/dL (ref 30.0–36.0)
MCV: 90.6 fL (ref 80.0–100.0)
Platelets: 186 10*3/uL (ref 150–400)
RBC: 2.67 MIL/uL — ABNORMAL LOW (ref 4.22–5.81)
RDW: 14.5 % (ref 11.5–15.5)
WBC: 11.3 10*3/uL — ABNORMAL HIGH (ref 4.0–10.5)
nRBC: 0.2 % (ref 0.0–0.2)

## 2021-01-23 LAB — BASIC METABOLIC PANEL
Anion gap: 9 (ref 5–15)
BUN: 53 mg/dL — ABNORMAL HIGH (ref 8–23)
CO2: 23 mmol/L (ref 22–32)
Calcium: 7.9 mg/dL — ABNORMAL LOW (ref 8.9–10.3)
Chloride: 101 mmol/L (ref 98–111)
Creatinine, Ser: 1.48 mg/dL — ABNORMAL HIGH (ref 0.61–1.24)
GFR, Estimated: 49 mL/min — ABNORMAL LOW (ref >60)
Glucose, Bld: 105 mg/dL — ABNORMAL HIGH (ref 70–99)
Potassium: 3.8 mmol/L (ref 3.5–5.1)
Sodium: 133 mmol/L — ABNORMAL LOW (ref 135–145)

## 2021-01-23 LAB — GLUCOSE, CAPILLARY
Glucose-Capillary: 95 mg/dL (ref 70–99)
Glucose-Capillary: 112 mg/dL — ABNORMAL HIGH (ref 70–99)
Glucose-Capillary: 117 mg/dL — ABNORMAL HIGH (ref 70–99)
Glucose-Capillary: 93 mg/dL (ref 70–99)
Glucose-Capillary: 108 mg/dL — ABNORMAL HIGH (ref 70–99)

## 2021-01-23 LAB — COOXEMETRY PANEL
Carboxyhemoglobin: 1.2 % (ref 0.5–1.5)
Methemoglobin: 1.2 % (ref 0.0–1.5)
O2 Saturation: 56.3 %
Total hemoglobin: 8 g/dL — ABNORMAL LOW (ref 12.0–16.0)

## 2021-01-23 MED ORDER — CARVEDILOL 3.125 MG PO TABS
3.1250 mg | ORAL_TABLET | Freq: Two times a day (BID) | ORAL | Status: DC
  Administered 2021-01-23 – 2021-01-25 (×5): 3.125 mg via ORAL
  Filled 2021-01-23 (×6): qty 1

## 2021-01-23 MED ORDER — FUROSEMIDE 10 MG/ML IJ SOLN
40.0000 mg | Freq: Once | INTRAMUSCULAR | Status: AC
  Administered 2021-01-23: 40 mg via INTRAVENOUS
  Filled 2021-01-23: qty 4

## 2021-01-23 MED ORDER — ORAL CARE MOUTH RINSE
15.0000 mL | Freq: Two times a day (BID) | OROMUCOSAL | Status: DC
  Administered 2021-01-24 – 2021-01-26 (×4): 15 mL via OROMUCOSAL

## 2021-01-23 MED ORDER — AMIODARONE HCL 200 MG PO TABS
400.0000 mg | ORAL_TABLET | Freq: Two times a day (BID) | ORAL | Status: DC
  Administered 2021-01-23 – 2021-01-27 (×10): 400 mg via ORAL
  Filled 2021-01-23 (×10): qty 2

## 2021-01-23 MED FILL — Magnesium Sulfate Inj 50%: INTRAMUSCULAR | Qty: 10 | Status: AC

## 2021-01-23 MED FILL — Heparin Sodium (Porcine) Inj 1000 Unit/ML: INTRAMUSCULAR | Qty: 30 | Status: AC

## 2021-01-23 MED FILL — Potassium Chloride Inj 2 mEq/ML: INTRAVENOUS | Qty: 40 | Status: AC

## 2021-01-23 NOTE — Progress Notes (Addendum)
EVENING ROUNDS NOTE :     [address]       [place] [address]             [phone]                 3 Days Post-Op Procedure(s) (LRB): CORONARY ARTERY BYPASS GRAFTING (CABG) X 3 ON PUMP, USING LEFT INTERNAL MAMMARY ARTERY AND LEFT ENDOSCOPIC GREATER SAPHENOUS VEIN HARVEST CONDUITS (N/A) TRANSESOPHAGEAL ECHOCARDIOGRAM (TEE) (N/A) APPLICATION OF CELL SAVER ENDOVEIN HARVEST OF GREATER SAPHENOUS VEIN (Left)  Total Length of Stay:  LOS: 7 days  BP 100/62   Pulse 78   Temp 98.2 F (36.8 C) (Oral)   Resp 19   Ht 6\' 3"  (1.905 m)   Wt 122.3 kg   SpO2 100%   BMI 33.70 kg/m   .Intake/Output      09/11 0701 09/12 0700 09/12 0701 09/13 0700   P.O. 240    I.V. (mL/kg) 237.2 (1.9) 60.8 (0.5)   IV Piggyback     Total Intake(mL/kg) 477.2 (3.9) 60.8 (0.5)   Urine (mL/kg/hr) 2820 (1) 1000 (0.9)   Chest Tube     Total Output 2820 1000   Net -2342.8 -939.2           sodium chloride     sodium chloride 250 mL (01/21/21 0500)   sodium chloride     lactated ringers Stopped (01/22/21 0547)   lactated ringers     milrinone 0.125 mcg/kg/min (01/23/21 1400)   nitroGLYCERIN Stopped (01/20/21 1414)     Lab Results  Component Value Date   WBC 11.3 (H) 01/23/2021   HGB 7.8 (L) 01/23/2021   HCT 24.2 (L) 01/23/2021   PLT 186 01/23/2021   GLUCOSE 105 (H) 01/23/2021   CHOL 163 01/16/2021   TRIG 33 01/16/2021   HDL 42 01/16/2021   LDLCALC 114 (H) 01/16/2021   ALT 86 (H) 01/16/2021   AST 570 (H) 01/16/2021   NA 133 (L) 01/23/2021   K 3.8 01/23/2021   CL 101 01/23/2021   CREATININE 1.48 (H) 01/23/2021   BUN 53 (H) 01/23/2021   CO2 23 01/23/2021   TSH 1.959 01/16/2021   INR 1.4 (H) 01/20/2021   HGBA1C 6.0 (H) 01/16/2021  Patient on the phone.  Maintaining SR on oral Amiodarone. Also, on  low dose Coreg Remains on Milrinone drip (co ox 56.3 earlier this am) Continuing to make steady progress   03/18/2021 PA-C 01/23/2021 3:38 PM   Back in A fib but  rate controlled Dc Foley in AM Consider weaning milrinone in AM  Amzie Sillas C. 03/25/2021, MD [facility] [phone]

## 2021-01-23 NOTE — Progress Notes (Signed)
3 Days Post-Op Procedure(s) (LRB): CORONARY ARTERY BYPASS GRAFTING (CABG) X 3 ON PUMP, USING LEFT INTERNAL MAMMARY ARTERY AND LEFT ENDOSCOPIC GREATER SAPHENOUS VEIN HARVEST CONDUITS (N/A) TRANSESOPHAGEAL ECHOCARDIOGRAM (TEE) (N/A) APPLICATION OF CELL SAVER ENDOVEIN HARVEST OF GREATER SAPHENOUS VEIN (Left) Subjective: No complaints, denies pain  Objective: Vital signs in last 24 hours: Temp:  [97.2 F (36.2 C)-99.6 F (37.6 C)] 98.4 F (36.9 C) (09/12 0409) Pulse Rate:  [68-119] 78 (09/12 0615) Cardiac Rhythm: Normal sinus rhythm (09/12 0400) Resp:  [12-26] 12 (09/12 0615) BP: (81-129)/(37-81) 105/70 (09/12 0615) SpO2:  [85 %-98 %] 97 % (09/12 0615) Weight:  [122.3 kg] 122.3 kg (09/12 0600)  Hemodynamic parameters for last 24 hours:    Intake/Output from previous day: 09/11 0701 - 09/12 0700 In: 477.2 [P.O.:240; I.V.:237.2] Out: 2820 [Urine:2820] Intake/Output this shift: No intake/output data recorded.  General appearance: alert, cooperative, and no distress Neurologic: intact Heart: regular rate and rhythm Lungs: diminished breath sounds bilaterally Abdomen: normal findings: soft, non-tender  Lab Results: Recent Labs    01/22/21 0511 01/23/21 0517  WBC 11.9* 11.3*  HGB 8.8* 7.8*  HCT 27.1* 24.2*  PLT 136* 186   BMET:  Recent Labs    01/22/21 1936 01/23/21 0517  NA 133* 133*  K 4.3 3.8  CL 101 101  CO2 23 23  GLUCOSE 147* 105*  BUN 48* 53*  CREATININE 1.40* 1.48*  CALCIUM 8.1* 7.9*    PT/INR:  Recent Labs    01/20/21 1400  LABPROT 17.1*  INR 1.4*   ABG    Component Value Date/Time   PHART 7.357 01/20/2021 2002   HCO3 20.9 01/20/2021 2002   TCO2 22 01/20/2021 2002   ACIDBASEDEF 4.0 (H) 01/20/2021 2002   O2SAT 56.3 01/23/2021 0520   CBG (last 3)  Recent Labs    01/22/21 1917 01/22/21 2338 01/23/21 0401  GLUCAP 128* 113* 95    Assessment/Plan: S/P Procedure(s) (LRB): CORONARY ARTERY BYPASS GRAFTING (CABG) X 3 ON PUMP, USING LEFT  INTERNAL MAMMARY ARTERY AND LEFT ENDOSCOPIC GREATER SAPHENOUS VEIN HARVEST CONDUITS (N/A) TRANSESOPHAGEAL ECHOCARDIOGRAM (TEE) (N/A) APPLICATION OF CELL SAVER ENDOVEIN HARVEST OF GREATER SAPHENOUS VEIN (Left) Looks good this AM NEURO- intact CV- in SR this AM on IV amiodarone  Change amiodarone to PO  Start low dose coreg  Co-ox a little low at 56, stable  Continue milrinone as we continue to diurese RESP- IS and flutter valve for atelectasis RENAL- creatinine stable  Still volume overloaded- continue IV lasix today ENDO- CBG OK, dc levemir, continue SSI Anemia secondary to ABL- monitor SCD + enoxaparin for DVT prophylaxis Continue cardiac rehab   LOS: 7 days    [name] 01/23/2021

## 2021-01-24 ENCOUNTER — Inpatient Hospital Stay (HOSPITAL_COMMUNITY): Payer: Medicare HMO

## 2021-01-24 LAB — CBC
HCT: 25.7 % — ABNORMAL LOW (ref 39.0–52.0)
Hemoglobin: 8.2 g/dL — ABNORMAL LOW (ref 13.0–17.0)
MCH: 28.7 pg (ref 26.0–34.0)
MCHC: 31.9 g/dL (ref 30.0–36.0)
MCV: 89.9 fL (ref 80.0–100.0)
Platelets: 255 10*3/uL (ref 150–400)
RBC: 2.86 MIL/uL — ABNORMAL LOW (ref 4.22–5.81)
RDW: 14.5 % (ref 11.5–15.5)
WBC: 10.2 10*3/uL (ref 4.0–10.5)
nRBC: 0 % (ref 0.0–0.2)

## 2021-01-24 LAB — BASIC METABOLIC PANEL
Anion gap: 9 (ref 5–15)
BUN: 49 mg/dL — ABNORMAL HIGH (ref 8–23)
CO2: 25 mmol/L (ref 22–32)
Calcium: 8 mg/dL — ABNORMAL LOW (ref 8.9–10.3)
Chloride: 99 mmol/L (ref 98–111)
Creatinine, Ser: 1.28 mg/dL — ABNORMAL HIGH (ref 0.61–1.24)
GFR, Estimated: 59 mL/min — ABNORMAL LOW (ref >60)
Glucose, Bld: 92 mg/dL (ref 70–99)
Potassium: 3.9 mmol/L (ref 3.5–5.1)
Sodium: 133 mmol/L — ABNORMAL LOW (ref 135–145)

## 2021-01-24 LAB — COOXEMETRY PANEL
Carboxyhemoglobin: 0.9 % (ref 0.5–1.5)
Carboxyhemoglobin: 1.2 % (ref 0.5–1.5)
Carboxyhemoglobin: 0.9 % (ref 0.5–1.5)
Methemoglobin: 0.8 % (ref 0.0–1.5)
Methemoglobin: 1.1 % (ref 0.0–1.5)
Methemoglobin: 0.9 % (ref 0.0–1.5)
O2 Saturation: 49.6 %
O2 Saturation: 51.3 %
O2 Saturation: 45.2 %
Total hemoglobin: 10.2 g/dL — ABNORMAL LOW (ref 12.0–16.0)
Total hemoglobin: 8.3 g/dL — ABNORMAL LOW (ref 12.0–16.0)
Total hemoglobin: 9 g/dL — ABNORMAL LOW (ref 12.0–16.0)

## 2021-01-24 LAB — GLUCOSE, CAPILLARY
Glucose-Capillary: 89 mg/dL (ref 70–99)
Glucose-Capillary: 82 mg/dL (ref 70–99)
Glucose-Capillary: 122 mg/dL — ABNORMAL HIGH (ref 70–99)
Glucose-Capillary: 89 mg/dL (ref 70–99)

## 2021-01-24 MED ORDER — FUROSEMIDE 40 MG PO TABS
40.0000 mg | ORAL_TABLET | Freq: Every day | ORAL | Status: DC
  Administered 2021-01-24 – 2021-01-30 (×7): 40 mg via ORAL
  Filled 2021-01-24 (×7): qty 1

## 2021-01-24 MED ORDER — POTASSIUM CHLORIDE CRYS ER 20 MEQ PO TBCR
20.0000 meq | EXTENDED_RELEASE_TABLET | Freq: Three times a day (TID) | ORAL | Status: AC
  Administered 2021-01-24 (×3): 20 meq via ORAL
  Filled 2021-01-24 (×3): qty 1

## 2021-01-24 MED ORDER — GUAIFENESIN ER 600 MG PO TB12
600.0000 mg | ORAL_TABLET | Freq: Two times a day (BID) | ORAL | Status: DC
  Administered 2021-01-24 – 2021-01-30 (×13): 600 mg via ORAL
  Filled 2021-01-24 (×13): qty 1

## 2021-01-24 MED ORDER — INFLUENZA VAC A&B SA ADJ QUAD 0.5 ML IM PRSY
0.5000 mL | PREFILLED_SYRINGE | INTRAMUSCULAR | Status: AC
  Administered 2021-01-27: 0.5 mL via INTRAMUSCULAR
  Filled 2021-01-24: qty 0.5

## 2021-01-24 MED ORDER — MILRINONE LACTATE IN DEXTROSE 20-5 MG/100ML-% IV SOLN
0.1250 ug/kg/min | INTRAVENOUS | Status: DC
  Administered 2021-01-24 – 2021-01-25 (×3): 0.25 ug/kg/min via INTRAVENOUS
  Administered 2021-01-26: 0.125 ug/kg/min via INTRAVENOUS
  Filled 2021-01-24 (×3): qty 100

## 2021-01-24 NOTE — Progress Notes (Signed)
EVENING ROUNDS NOTE :     [address]       [place] [address]             [phone]                 4 Days Post-Op Procedure(s) (LRB): CORONARY ARTERY BYPASS GRAFTING (CABG) X 3 ON PUMP, USING LEFT INTERNAL MAMMARY ARTERY AND LEFT ENDOSCOPIC GREATER SAPHENOUS VEIN HARVEST CONDUITS (N/A) TRANSESOPHAGEAL ECHOCARDIOGRAM (TEE) (N/A) APPLICATION OF CELL SAVER ENDOVEIN HARVEST OF GREATER SAPHENOUS VEIN (Left)   Total Length of Stay:  LOS: 8 days  Events:   Low co-ox off milr, restarted Consider picc placement tomorrow    BP 102/60   Pulse 73   Temp 98.3 F (36.8 C) (Oral)   Resp (!) 21   Ht 6\' 3"  (1.905 m)   Wt 121.2 kg   SpO2 99%   BMI 33.40 kg/m          sodium chloride     sodium chloride 250 mL (01/21/21 0500)   sodium chloride     lactated ringers Stopped (01/22/21 0547)   lactated ringers     milrinone 0.25 mcg/kg/min (01/24/21 1436)   nitroGLYCERIN Stopped (01/20/21 1414)    I/O last 3 completed shifts: In: 551.7 [P.O.:180; I.V.:371.7] Out: 3640 [Urine:3640]   CBC Latest Ref Rng & Units 01/24/2021 01/23/2021 01/22/2021  WBC 4.0 - 10.5 K/uL 10.2 11.3(H) 11.9(H)  Hemoglobin 13.0 - 17.0 g/dL 8.2(L) 7.8(L) 8.8(L)  Hematocrit 39.0 - 52.0 % 25.7(L) 24.2(L) 27.1(L)  Platelets 150 - 400 K/uL 255 186 136(L)    BMP Latest Ref Rng & Units 01/24/2021 01/23/2021 01/22/2021  Glucose 70 - 99 mg/dL 92 03/24/2021) 595(G)  BUN 8 - 23 mg/dL 387(F) 64(P) 32(R)  Creatinine 0.61 - 1.24 mg/dL 51(O) 8.41(Y) 6.06(T)  Sodium 135 - 145 mmol/L 133(L) 133(L) 133(L)  Potassium 3.5 - 5.1 mmol/L 3.9 3.8 4.3  Chloride 98 - 111 mmol/L 99 101 101  CO2 22 - 32 mmol/L 25 23 23   Calcium 8.9 - 10.3 mg/dL 8.0(L) 7.9(L) 8.1(L)    ABG    Component Value Date/Time   PHART 7.357 01/20/2021 2002   PCO2ART 37.6 01/20/2021 2002   PO2ART 83 01/20/2021 2002   HCO3 20.9 01/20/2021 2002   TCO2 22 01/20/2021 2002   ACIDBASEDEF 4.0 (H) 01/20/2021 2002   O2SAT 45.2 01/24/2021  1234       03/22/2021, MD 01/24/2021 4:25 PM

## 2021-01-24 NOTE — TOC Initial Note (Addendum)
Transition of Care [facility]) - Initial/Assessment Note    Patient Details  Name: Timothy Munoz MRN: [id number] Date of Birth: 03-06-1946  [facility]) CM/SW Contact:    Gala Lewandowsky, RN Phone Number: 01/24/2021, 2:46 PM  Clinical Narrative:  Case Manager received a consult stating daughter requesting assistance for at home care needs. Case Manager called the daughter [name] to discuss home care needs. [name] states that the patient will return to her home at [address] [place] [address] once he is stable. [name] states she will be able to provide the supervision for her father; however, he will need a hospital bed since she will not have a bed in her home for him to use. Orders for hospital bed will need to be placed in Epic. [name] stated he may need a shower chair and a rolling walker once he gets closer to discharge-she needs to see how he progresses. Case Manager reached out to staff RN to see how well the patient was moving. Patient may benefit from a PT/OT consult. Case Manager will continue to follow.   Expected Discharge Plan: Home w Home Health Services    Patient Goals and CMS Choice Patient states their goals for this hospitalization and ongoing recovery are:: to return to his daughters home      Expected Discharge Plan and Services Expected Discharge Plan: Home w Home Health Services In-house Referral: NA Discharge Planning Services: CM Consult   Living arrangements for the past 2 months: Single Family Home                   Prior Living Arrangements/Services Living arrangements for the past 2 months: Single Family Home Lives with:: Self (Patient will return to his daughters home once stable.) Patient language and need for interpreter reviewed:: Yes        Need for Family Participation in Patient Care: Yes (Comment) Care giver support system in place?: Yes (comment)   Criminal Activity/Legal Involvement Pertinent to Current  Situation/Hospitalization: No - Comment as needed  Activities of Daily Living Home Assistive Devices/Equipment: None ADL Screening (condition at time of admission) Patient's cognitive ability adequate to safely complete daily activities?: Yes Is the patient deaf or have difficulty hearing?: No Does the patient have difficulty seeing, even when wearing glasses/contacts?: No Does the patient have difficulty concentrating, remembering, or making decisions?: No Patient able to express need for assistance with ADLs?: Yes Does the patient have difficulty dressing or bathing?: No Independently performs ADLs?: Yes (appropriate for developmental age) Does the patient have difficulty walking or climbing stairs?: No Weakness of Legs: None Weakness of Arms/Hands: None  Permission Sought/Granted Permission sought to share information with : Family Supports, Case Manager   Emotional Assessment Appearance:: Appears stated age       Alcohol / Substance Use: Not Applicable Psych Involvement: No (comment)  Admission diagnosis:  STEMI (ST elevation myocardial infarction) (HCC) [I21.3] STEMI involving left anterior descending coronary artery (HCC) [I21.02] Coronary artery disease [I25.10] Patient Active Problem List   Diagnosis Date Noted   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   Coronary artery disease 01/20/2021   Acute systolic CHF (congestive heart failure) (HCC) 01/17/2021   STEMI (ST elevation myocardial infarction) (HCC) 01/16/2021   STEMI involving left anterior descending coronary artery (HCC) 01/16/2021   Cardiogenic shock (HCC)    Tremor, essential 03/04/2019   HTN (hypertension) 10/31/2015   Obesity 10/31/2015   Mild hyperlipidemia 10/31/2015   PCP:  [name], MD  Pharmacy:   [facility] [place], [place] - [address] [place] [address] Phone: [phone] Fax: [phone]   Readmission Risk Interventions No flowsheet data found.

## 2021-01-24 NOTE — Progress Notes (Addendum)
TCTS DAILY ICU PROGRESS NOTE                   [address]            [place] [address]          [phone]   4 Days Post-Op Procedure(s) (LRB): CORONARY ARTERY BYPASS GRAFTING (CABG) X 3 ON PUMP, USING LEFT INTERNAL MAMMARY ARTERY AND LEFT ENDOSCOPIC GREATER SAPHENOUS VEIN HARVEST CONDUITS (N/A) TRANSESOPHAGEAL ECHOCARDIOGRAM (TEE) (N/A) APPLICATION OF CELL SAVER ENDOVEIN HARVEST OF GREATER SAPHENOUS VEIN (Left)  Total Length of Stay:  LOS: 8 days   Subjective: Up in the bedside chair after walking a lap in the unit this morning.  Says he feels he is improving daily.  Has had a BM since surgery but none in a couple of days.  Milrinone at 0.144mcg/kg/min,  CoOx 51 this am.   Objective: Vital signs in last 24 hours: Temp:  [98 F (36.7 C)-98.5 F (36.9 C)] 98.3 F (36.8 C) (09/13 0400) Pulse Rate:  [70-100] 86 (09/13 0715) Cardiac Rhythm: Normal sinus rhythm (09/13 0715) Resp:  [12-24] 19 (09/13 0715) BP: (90-136)/(57-79) 136/70 (09/13 0715) SpO2:  [81 %-100 %] 95 % (09/13 0715) Weight:  [121.2 kg] 121.2 kg (09/13 0600)  Filed Weights   01/22/21 0500 01/23/21 0600 01/24/21 0600  Weight: 124.2 kg 122.3 kg 121.2 kg    Weight change: -1.1 kg      Intake/Output from previous day: 09/12 0701 - 09/13 0700 In: 314.5 [P.O.:180; I.V.:134.5] Out: 2420 [Urine:2420]  Intake/Output this shift: No intake/output data recorded.  Current Meds: Scheduled Meds:  acetaminophen  1,000 mg Oral Q6H   Or   acetaminophen (TYLENOL) oral liquid 160 mg/5 mL  1,000 mg Per Tube Q6H   amiodarone  400 mg Oral BID   aspirin EC  325 mg Oral Daily   Or   aspirin  324 mg Per Tube Daily   atorvastatin  80 mg Oral Daily   bisacodyl  10 mg Oral Daily   Or   bisacodyl  10 mg Rectal Daily   carvedilol  3.125 mg Oral BID WC   Chlorhexidine Gluconate Cloth  6 each Topical Daily   docusate sodium  200 mg Oral Daily   enoxaparin (LOVENOX) injection  40 mg Subcutaneous QHS    [START ON 01/25/2021] influenza vaccine adjuvanted  0.5 mL Intramuscular Tomorrow-1000   insulin aspart  0-24 Units Subcutaneous TID WC   mouth rinse  15 mL Mouth Rinse BID   pantoprazole  40 mg Oral Daily   primidone  50 mg Oral QHS   sodium chloride flush  10-40 mL Intracatheter Q12H   sodium chloride flush  3 mL Intravenous Q12H   Continuous Infusions:  sodium chloride     sodium chloride 250 mL (01/21/21 0500)   sodium chloride     lactated ringers Stopped (01/22/21 0547)   lactated ringers     milrinone 0.125 mcg/kg/min (01/24/21 0700)   nitroGLYCERIN Stopped (01/20/21 1414)   PRN Meds:.sodium chloride, metoprolol tartrate, morphine injection, ondansetron (ZOFRAN) IV, oxyCODONE, sodium chloride flush, sodium chloride flush, traMADol  General appearance: alert and no distress Neurologic: intact Heart: SR with PAC's now, had rate-controlled PAF over night.  Lungs: breath sounds clear, shallow, CXR shows stable patchy densities.  Abdomen: soft, not tender Extremities: peripheral edema persists. Wound: the sternal incision is open to air, dry and intact. Pacer wires n place.   Lab Results: CBC: Recent Labs  01/23/21 0517 01/24/21 0413  WBC 11.3* 10.2  HGB 7.8* 8.2*  HCT 24.2* 25.7*  PLT 186 255   BMET:  Recent Labs    01/23/21 0517 01/24/21 0413  NA 133* 133*  K 3.8 3.9  CL 101 99  CO2 23 25  GLUCOSE 105* 92  BUN 53* 49*  CREATININE 1.48* 1.28*  CALCIUM 7.9* 8.0*    CMET: Lab Results  Component Value Date   WBC 10.2 01/24/2021   HGB 8.2 (L) 01/24/2021   HCT 25.7 (L) 01/24/2021   PLT 255 01/24/2021   GLUCOSE 92 01/24/2021   CHOL 163 01/16/2021   TRIG 33 01/16/2021   HDL 42 01/16/2021   LDLCALC 114 (H) 01/16/2021   ALT 86 (H) 01/16/2021   AST 570 (H) 01/16/2021   NA 133 (L) 01/24/2021   K 3.9 01/24/2021   CL 99 01/24/2021   CREATININE 1.28 (H) 01/24/2021   BUN 49 (H) 01/24/2021   CO2 25 01/24/2021   TSH 1.959 01/16/2021   INR 1.4 (H)  01/20/2021   HGBA1C 6.0 (H) 01/16/2021      PT/INR: No results for input(s): LABPROT, INR in the last 72 hours. Radiology: No results found.   Assessment/Plan: S/P Procedure(s) (LRB): CORONARY ARTERY BYPASS GRAFTING (CABG) X 3 ON PUMP, USING LEFT INTERNAL MAMMARY ARTERY AND LEFT ENDOSCOPIC GREATER SAPHENOUS VEIN HARVEST CONDUITS (N/A) TRANSESOPHAGEAL ECHOCARDIOGRAM (TEE) (N/A) APPLICATION OF CELL SAVER ENDOVEIN HARVEST OF GREATER SAPHENOUS VEIN (Left)  -CV-POD4 CABG x3 after presenting with acute anterior MI, pre-op EV 40-45%. Continue milrinone 0.160mcg/kg/min for now with CoOx 51 this AM. ON ASA, statin, low-dose carvedilol.   -Post-op a-fib- SR now with some PAF last night, continue oral amio load. K+ 3.9, supplement.   -Volume excess- Wt ~4kg+ and still has significant peripheral edema. PO Lasix daily.   -Endo- no h/o DM, CBG's 80's-120.   -Pulm- oxygenating well on 2Lnc O2,  stable CXR with left basilar ATX and small effusion. Continue working on pulmonary hygiene with IS and flutter valve. Add Mucinex for productive cough.   -DVT PPX- on daily [place] enoxaparin.     Leary Roca, PA-C  01/24/2021 7:40 AM Patient seen and examined, agree with above Will dc milrinone and repeat co-ox at noon DC Foley In SR with PACs this AM- continue amiodarone + Coreg  Wilian Kwong C. Dorris Fetch, MD [facility] [phone]

## 2021-01-25 ENCOUNTER — Inpatient Hospital Stay (HOSPITAL_COMMUNITY): Payer: Medicare HMO

## 2021-01-25 DIAGNOSIS — I2102 ST elevation (STEMI) myocardial infarction involving left anterior descending coronary artery: Secondary | ICD-10-CM

## 2021-01-25 LAB — BASIC METABOLIC PANEL
Anion gap: 8 (ref 5–15)
BUN: 44 mg/dL — ABNORMAL HIGH (ref 8–23)
CO2: 23 mmol/L (ref 22–32)
Calcium: 8.1 mg/dL — ABNORMAL LOW (ref 8.9–10.3)
Chloride: 104 mmol/L (ref 98–111)
Creatinine, Ser: 1.21 mg/dL (ref 0.61–1.24)
GFR, Estimated: >60 mL/min (ref >60)
Glucose, Bld: 100 mg/dL — ABNORMAL HIGH (ref 70–99)
Potassium: 4.4 mmol/L (ref 3.5–5.1)
Sodium: 135 mmol/L (ref 135–145)

## 2021-01-25 LAB — GLUCOSE, CAPILLARY
Glucose-Capillary: 87 mg/dL (ref 70–99)
Glucose-Capillary: 92 mg/dL (ref 70–99)
Glucose-Capillary: 125 mg/dL — ABNORMAL HIGH (ref 70–99)
Glucose-Capillary: 111 mg/dL — ABNORMAL HIGH (ref 70–99)

## 2021-01-25 LAB — COOXEMETRY PANEL
Carboxyhemoglobin: 1.1 % (ref 0.5–1.5)
Carboxyhemoglobin: 1.3 % (ref 0.5–1.5)
Methemoglobin: 0.7 % (ref 0.0–1.5)
Methemoglobin: 0.9 % (ref 0.0–1.5)
O2 Saturation: 61.2 %
O2 Saturation: 48.1 %
Total hemoglobin: 7.1 g/dL — ABNORMAL LOW (ref 12.0–16.0)
Total hemoglobin: 8.4 g/dL — ABNORMAL LOW (ref 12.0–16.0)

## 2021-01-25 LAB — CBC
HCT: 25.6 % — ABNORMAL LOW (ref 39.0–52.0)
Hemoglobin: 8 g/dL — ABNORMAL LOW (ref 13.0–17.0)
MCH: 28.7 pg (ref 26.0–34.0)
MCHC: 31.3 g/dL (ref 30.0–36.0)
MCV: 91.8 fL (ref 80.0–100.0)
Platelets: 325 10*3/uL (ref 150–400)
RBC: 2.79 MIL/uL — ABNORMAL LOW (ref 4.22–5.81)
RDW: 14.7 % (ref 11.5–15.5)
WBC: 9.9 10*3/uL (ref 4.0–10.5)
nRBC: 0 % (ref 0.0–0.2)

## 2021-01-25 LAB — ECHOCARDIOGRAM COMPLETE
Area-P 1/2: 4.64 cm2
Height: 75 in
S' Lateral: 3.4 cm
Weight: 4232.83 [oz_av]

## 2021-01-25 MED ORDER — MELATONIN 3 MG PO TABS
3.0000 mg | ORAL_TABLET | Freq: Every day | ORAL | Status: DC
  Administered 2021-01-25 – 2021-01-29 (×5): 3 mg via ORAL
  Filled 2021-01-25 (×5): qty 1

## 2021-01-25 MED ORDER — CARVEDILOL 6.25 MG PO TABS
6.2500 mg | ORAL_TABLET | Freq: Two times a day (BID) | ORAL | Status: DC
  Administered 2021-01-25 – 2021-01-26 (×3): 6.25 mg via ORAL
  Filled 2021-01-25 (×3): qty 1

## 2021-01-25 MED ORDER — DIGOXIN 125 MCG PO TABS
0.1250 mg | ORAL_TABLET | Freq: Every day | ORAL | Status: DC
  Administered 2021-01-25 – 2021-01-26 (×2): 0.125 mg via ORAL
  Filled 2021-01-25 (×2): qty 1

## 2021-01-25 NOTE — Progress Notes (Signed)
      [address]       [place] [address]             [phone]      5 Days Post-Op  Procedure(s) (LRB): CORONARY ARTERY BYPASS GRAFTING (CABG) X 3 ON PUMP, USING LEFT INTERNAL MAMMARY ARTERY AND LEFT ENDOSCOPIC GREATER SAPHENOUS VEIN HARVEST CONDUITS (N/A) TRANSESOPHAGEAL ECHOCARDIOGRAM (TEE) (N/A) APPLICATION OF CELL SAVER ENDOVEIN HARVEST OF GREATER SAPHENOUS VEIN (Left)   Total Length of Stay:  LOS: 9 days    SUBJECTIVE:  Vitals:   01/25/21 1201 01/25/21 1300  BP:  112/73  Pulse:  80  Resp:  (!) 21  Temp: 98.2 F (36.8 C)   SpO2:  96%    Intake/Output      09/13 0701 09/14 0700 09/14 0701 09/15 0700   P.O. 360    I.V. (mL/kg) 143.4 (1.2)    Total Intake(mL/kg) 503.4 (4.2)    Urine (mL/kg/hr) 470 (0.2)    Stool 0    Total Output 470    Net +33.4         Urine Occurrence 3 x    Stool Occurrence 2 x        sodium chloride     sodium chloride 250 mL (01/21/21 0500)   sodium chloride     lactated ringers Stopped (01/22/21 0547)   lactated ringers     milrinone 0.125 mcg/kg/min (01/25/21 1034)    CBC    Component Value Date/Time   WBC 9.9 01/25/2021 0424   RBC 2.79 (L) 01/25/2021 0424   HGB 8.0 (L) 01/25/2021 0424   HCT 25.6 (L) 01/25/2021 0424   PLT 325 01/25/2021 0424   MCV 91.8 01/25/2021 0424   MCH 28.7 01/25/2021 0424   MCHC 31.3 01/25/2021 0424   RDW 14.7 01/25/2021 0424   CMP     Component Value Date/Time   NA 135 01/25/2021 0424   K 4.4 01/25/2021 0424   CL 104 01/25/2021 0424   CO2 23 01/25/2021 0424   GLUCOSE 100 (H) 01/25/2021 0424   BUN 44 (H) 01/25/2021 0424   CREATININE 1.21 01/25/2021 0424   CREATININE 1.17 12/24/2016 1015   CALCIUM 8.1 (L) 01/25/2021 0424   PROT 6.4 (L) 01/16/2021 0739   ALBUMIN 3.7 01/16/2021 0739   AST 570 (H) 01/16/2021 0739   ALT 86 (H) 01/16/2021 0739   ALKPHOS 133 (H) 01/16/2021 0739   BILITOT 0.5 01/16/2021 0739   GFRNONAA >60 01/25/2021 0424   ABG    Component Value  Date/Time   PHART 7.357 01/20/2021 2002   PCO2ART 37.6 01/20/2021 2002   PO2ART 83 01/20/2021 2002   HCO3 20.9 01/20/2021 2002   TCO2 22 01/20/2021 2002   ACIDBASEDEF 4.0 (H) 01/20/2021 2002   O2SAT 48.1 01/25/2021 1522   CBG (last 3)  Recent Labs    01/24/21 2144 01/25/21 0605 01/25/21 1115  GLUCAP 89 87 92     ASSESSMENT:  Maintaining NSR, Remains on Milrinone at 0.125 mcg/kg/min.Marland Kitchen Co-ox down to 48.1 this afternoon may need to go back up on dose  Echocardiogram completed... read is pending  Working with PT, orders placed   Lowella Dandy, PA-C 01/25/21 3:49 PM   Chart reviewed, agree with above. Echo shows mild LV systolic dysfunction with EF 40-45%, small pericardial effusion. Will keep milrinone at 0.125 overnight and repeat in am. BP is fine and sats 99%.

## 2021-01-25 NOTE — Plan of Care (Signed)
Stable during shift. NSR with brief episodes of rate controlled afib during night. After ambulating this am back in Afib, 130s during walking, 120s in recliner.   BP stable. Remains on milrinone gtt, coox improved to 61.3 this am. Foley out, voiding.   Performing ADLs, pulmonary toilet, responding well to mucinex.   Problem: Education: Goal: Knowledge of General Education information will improve Description: Including pain rating scale, medication(s)/side effects and non-pharmacologic comfort measures Outcome: Progressing   Problem: Clinical Measurements: Goal: Ability to maintain clinical measurements within normal limits will improve Outcome: Progressing Goal: Will remain free from infection Outcome: Progressing Goal: Respiratory complications will improve Outcome: Progressing Goal: Cardiovascular complication will be avoided Outcome: Progressing   Problem: Activity: Goal: Risk for activity intolerance will decrease Outcome: Progressing   Problem: Coping: Goal: Level of anxiety will decrease Outcome: Progressing   Problem: Elimination: Goal: Will not experience complications related to bowel motility Outcome: Progressing Goal: Will not experience complications related to urinary retention Outcome: Progressing   Problem: Pain Managment: Goal: General experience of comfort will improve Outcome: Progressing   Problem: Safety: Goal: Ability to remain free from injury will improve Outcome: Progressing

## 2021-01-25 NOTE — Evaluation (Signed)
Physical Therapy Evaluation & Discharge Patient Details Name: Timothy Munoz MRN: [id number] DOB: January 08, 1946 Today's Date: 01/25/2021  History of Present Illness  Timothy Munoz is a 75 y.o. male admitted 01/16/21 with chest pain, STEMI. S/p emergent LHC which showed multivessel disease; LAD was angioplastied to reestablish flow and IABP placed. S/p CABG x3 on 9/9. PMH includes HLD, HTN, obesity, essential tremor, knee pain, back sx.   Clinical Impression  Patient evaluated by Physical Therapy with no further acute Timothy Munoz needs identified. PTA, Timothy Munoz independent, active, lives alone; plans to d/c to daughter's home for added support. Today, Timothy Munoz mobilizing well without DME, supervision for safety/lines. Educ re: sternal precautions, activity recommendations, energy conservation. All education has been completed and the patient has no further questions. Acute Timothy Munoz is signing off. Thank you for this referral.       Recommendations for follow up therapy are one component of a multi-disciplinary discharge planning process, led by the attending physician.  Recommendations may be updated based on patient status, additional functional criteria and insurance authorization.  Follow Up Recommendations No Timothy Munoz follow up;Supervision - Intermittent    Equipment Recommendations  Rollator;3in1 (Timothy Munoz);Hospital bed   Recommendations for Other Services       Precautions / Restrictions Precautions Precautions: Fall;Sternal      Mobility  Bed Mobility Overal bed mobility: Needs Assistance Bed Mobility: Supine to Sit;Sit to Supine     Supine to sit: Modified independent (Device/Increase time);HOB elevated Sit to supine: Min assist   General bed mobility comments: Mod indep to sit EOB from elevated HOB; minA for BLE management with return to supine in flat bed    Transfers Overall transfer level: Modified independent Equipment used: None Transfers: Sit to/from Stand           General transfer comment: Multiple  sit<>stands from EOB, educ on technique for hands on knees, Timothy Munoz reliant on momentum and UE support  Ambulation/Gait Ambulation/Gait assistance: Supervision Gait Distance (Feet): 80 Feet Assistive device: None Gait Pattern/deviations: Step-through pattern;Decreased stride length Gait velocity: Decreased Gait velocity interpretation: 1.31 - 2.62 ft/sec, indicative of limited community ambulator General Gait Details: Timothy Munoz fatigued having recently ambulated entire 2H hallway with RN; able to ambulate multiple laps around room without DME, good ability to navigate room without overt instability or LOB  Stairs            Wheelchair Mobility    Modified Rankin (Stroke Patients Only)       Balance Overall balance assessment: Needs assistance Sitting-balance support: No upper extremity supported;Feet supported Sitting balance-Leahy Scale: Good     Standing balance support: No upper extremity supported;During functional activity Standing balance-Leahy Scale: Good               High level balance activites: Side stepping;Backward walking;Direction changes;Turns;Sudden stops;Head turns High Level Balance Comments: no overt instability or LOB with higher level balance tasks             Pertinent Vitals/Pain Pain Assessment: Faces Faces Pain Scale: Hurts little more Pain Location: sternal incision Pain Descriptors / Indicators: Grimacing;Guarding;Sore Pain Intervention(s): Monitored during session    Home Living Family/patient expects to be discharged to:: Private residence Living Arrangements: Alone Available Help at Discharge: Family;Available 24 hours/day Type of Home: House Home Access: Stairs to enter Entrance Stairs-Rails: Right Entrance Stairs-Number of Steps: 3 Home Layout: One level Home Equipment: None Additional Comments: Lives alone; will d/c to daughter's home who lives nearby and works from home    Prior Function Level  of Independence: Independent          Comments: Independent without DME, retired     Higher education careers adviser        Extremity/Trunk Assessment   Upper Extremity Assessment Upper Extremity Assessment: Generalized weakness (good functional strength, but limited by confines of sternal precautions and edematous extremities)    Lower Extremity Assessment Lower Extremity Assessment: Overall WFL for tasks assessed (noted swelling in BLEs)       Communication   Communication: No difficulties  Cognition Arousal/Alertness: Awake/alert Behavior During Therapy: WFL for tasks assessed/performed Overall Cognitive Status: Within Functional Limits for tasks assessed                                        General Comments General comments (skin integrity, edema, etc.): BP 115/76, SpO2 >/93% on RA. Timothy Munoz notes that daughter does not have bed for Timothy Munoz at her home; Timothy Munoz would benefit from hospital bed to allow ease of maintaining sternal precautions    Exercises     Assessment/Plan    Timothy Munoz Assessment Patent does not need any further Timothy Munoz services  Timothy Munoz Problem List         Timothy Munoz Treatment Interventions      Timothy Munoz Goals (Current goals can be found in the Care Plan section)  Acute Rehab Timothy Munoz Goals Timothy Munoz Goal Formulation: All assessment and education complete, DC therapy    Frequency     Barriers to discharge        Co-evaluation               AM-PAC Timothy Munoz "6 Clicks" Mobility  Outcome Measure Help needed turning from your back to your side while in a flat bed without using bedrails?: None Help needed moving from lying on your back to sitting on the side of a flat bed without using bedrails?: A Little Help needed moving to and from a bed to a chair (including a wheelchair)?: A Little Help needed standing up from a chair using your arms (e.g., wheelchair or bedside chair)?: None Help needed to walk in hospital room?: A Little Help needed climbing 3-5 steps with a railing? : A Little 6 Click Score: 20    End of Session  Equipment Utilized During Treatment: Gait belt Activity Tolerance: Patient tolerated treatment well Patient left: in bed;with call bell/phone within reach Nurse Communication: Mobility status Timothy Munoz Visit Diagnosis: Other abnormalities of gait and mobility (R26.89)    Time: 3664-4034 Timothy Munoz Time Calculation (min) (ACUTE ONLY): 22 min   Charges:   Timothy Munoz Evaluation $Timothy Munoz Eval Moderate Complexity: 1 Mod        Timothy Munoz, Timothy Munoz, Timothy Munoz Acute Rehabilitation Services  Pager [phone] Office [phone]  Timothy Munoz 01/25/2021, 12:48 PM

## 2021-01-25 NOTE — Progress Notes (Addendum)
Progress Note  Patient Name: Timothy Munoz Date of Encounter: 01/25/2021  [facility] Cardiologist: None   Subjective   Feeling better, in chair. Edema noted. Ambulating well  Inpatient Medications    Scheduled Meds:  acetaminophen  1,000 mg Oral Q6H   Or   acetaminophen (TYLENOL) oral liquid 160 mg/5 mL  1,000 mg Per Tube Q6H   amiodarone  400 mg Oral BID   aspirin EC  325 mg Oral Daily   Or   aspirin  324 mg Per Tube Daily   atorvastatin  80 mg Oral Daily   bisacodyl  10 mg Oral Daily   Or   bisacodyl  10 mg Rectal Daily   carvedilol  6.25 mg Oral BID WC   Chlorhexidine Gluconate Cloth  6 each Topical Daily   docusate sodium  200 mg Oral Daily   enoxaparin (LOVENOX) injection  40 mg Subcutaneous QHS   furosemide  40 mg Oral Daily   guaiFENesin  600 mg Oral BID   influenza vaccine adjuvanted  0.5 mL Intramuscular Tomorrow-1000   insulin aspart  0-24 Units Subcutaneous TID WC   mouth rinse  15 mL Mouth Rinse BID   melatonin  3 mg Oral QHS   pantoprazole  40 mg Oral Daily   primidone  50 mg Oral QHS   sodium chloride flush  10-40 mL Intracatheter Q12H   sodium chloride flush  3 mL Intravenous Q12H   Continuous Infusions:  sodium chloride     sodium chloride 250 mL (01/21/21 0500)   sodium chloride     lactated ringers Stopped (01/22/21 0547)   lactated ringers     milrinone 0.25 mcg/kg/min (01/25/21 0734)   nitroGLYCERIN Stopped (01/20/21 1414)   PRN Meds: sodium chloride, metoprolol tartrate, morphine injection, ondansetron (ZOFRAN) IV, oxyCODONE, sodium chloride flush, sodium chloride flush, traMADol   Vital Signs    Vitals:   01/25/21 0620 01/25/21 0625 01/25/21 0633 01/25/21 0700  BP:    123/86  Pulse: (!) (P) 132 (!) 133 (!) 121 (!) 105  Resp: (!) 27 (!) 22 18 (!) 21  Temp:      TempSrc:      SpO2: 100% 100% 99% 98%  Weight:      Height:        Intake/Output Summary (Last 24 hours) at 01/25/2021 0916 Last data filed at 01/25/2021  0600 Gross per 24 hour  Intake 495.24 ml  Output 470 ml  Net 25.24 ml   Last 3 Weights 01/25/2021 01/24/2021 01/23/2021  Weight (lbs) 264 lb 8.8 oz 267 lb 3.2 oz 269 lb 10 oz  Weight (kg) 120 kg 121.2 kg 122.3 kg      Telemetry    NSR, prior AFIB this AM - Personally Reviewed  ECG    No new - Personally Reviewed  Physical Exam   GEN: No acute distress.   Neck: No JVD Cardiac: RRR, no murmurs, rubs, or gallops. Post CABG Respiratory: Clear to auscultation bilaterally. GI: Soft, nontender, non-distended  MS: Global 3+ edema; No deformity. Neuro:  Nonfocal  Psych: Normal affect   Labs    High Sensitivity Troponin:   Recent Labs  Lab 01/16/21 0739 01/16/21 0955  TROPONINIHS >24,000* >24,000*      Chemistry Recent Labs  Lab 01/23/21 0517 01/24/21 0413 01/25/21 0424  NA 133* 133* 135  K 3.8 3.9 4.4  CL 101 99 104  CO2 23 25 23   GLUCOSE 105* 92 100*  BUN 53* 49* 44*  CREATININE 1.48* 1.28* 1.21  CALCIUM 7.9* 8.0* 8.1*  GFRNONAA 49* 59* >60  ANIONGAP 9 9 8      Hematology Recent Labs  Lab 01/23/21 0517 01/24/21 0413 01/25/21 0424  WBC 11.3* 10.2 9.9  RBC 2.67* 2.86* 2.79*  HGB 7.8* 8.2* 8.0*  HCT 24.2* 25.7* 25.6*  MCV 90.6 89.9 91.8  MCH 29.2 28.7 28.7  MCHC 32.2 31.9 31.3  RDW 14.5 14.5 14.7  PLT 186 255 325    BNPNo results for input(s): BNP, PROBNP in the last 168 hours.   DDimer No results for input(s): DDIMER in the last 168 hours.   Radiology    DG Chest Port 1 View  Result Date: 01/24/2021 CLINICAL DATA:  Open heart surgery EXAM: PORTABLE CHEST 1 VIEW COMPARISON:  01/23/2021 FINDINGS: No significant change in AP portable chest radiographs with cardiomegaly status post median sternotomy and CABG, with layering bilateral pleural effusions and associated atelectasis or consolidation. No new airspace opacity. Right neck vascular sheath remains in position. IMPRESSION: No significant change in AP portable chest radiographs with cardiomegaly  status post median sternotomy and CABG, with layering bilateral pleural effusions and associated atelectasis or consolidation. No new airspace opacity. Electronically Signed   By: 03/25/2021 M.D.   On: 01/24/2021 08:43    Cardiac Studies   New ECHO pending  Patient Profile     75 y.o. male with anterior lateral STEMI with balloon angioplasty of ostial LAD suboptimal, cardiogenic shock, EF 35%, intra-aortic balloon pump for support, surgical revascularization post CABG.  Assessment & Plan    STEMI Cardiogenic shock-EF 40 to 45% during this admit with normal RV function Acute systolic heart failure - Post CABG has needed milrinone off and off.  Currently on low-dose 0.125.  Co-ox this morning reasonable at 61.  Yesterday in the 40s.  Ambulating the hallway.  Seems clinically improved.  Blood pressure also mounting a reasonable response in the 120s. -Recent carvedilol to 6.25 mg twice a day. -No ARN I/ARB at this point. -Given his overall clinical improvement.  I think it seems reasonable to await results of echocardiogram to ensure that there is no marked decrease in RV or LV function post CABG.  If overall reassuring, I would trial once again discontinuation of milrinone this afternoon.  -I will add digoxin 0.125 mg to assist with contractility as well in the setting of acute systolic heart failure.  Paroxysmal atrial fibrillation - Maintaining sinus rhythm. Recent episode of RVR however.   Currently on amiodarone formed to twice daily.  Should be transient.  We will continue to follow  35 minutes spent with patient review of medical records, data complex medical disease.  For questions or updates, please contact [facility] Please consult [email or web address] for contact info under        Signed, 67, MD  01/25/2021, 9:16 AM

## 2021-01-25 NOTE — Progress Notes (Signed)
  Echocardiogram 2D Echocardiogram has been performed.  Leta Jungling M 01/25/2021, 12:06 PM

## 2021-01-25 NOTE — Progress Notes (Signed)
5 Days Post-Op Procedure(s) (LRB): CORONARY ARTERY BYPASS GRAFTING (CABG) X 3 ON PUMP, USING LEFT INTERNAL MAMMARY ARTERY AND LEFT ENDOSCOPIC GREATER SAPHENOUS VEIN HARVEST CONDUITS (N/A) TRANSESOPHAGEAL ECHOCARDIOGRAM (TEE) (N/A) APPLICATION OF CELL SAVER ENDOVEIN HARVEST OF GREATER SAPHENOUS VEIN (Left) Subjective: No complaints this AM In and out of A fib this AM  Objective: Vital signs in last 24 hours: Temp:  [98.1 F (36.7 C)-98.8 F (37.1 C)] 98.8 F (37.1 C) (09/14 0600) Pulse Rate:  [73-133] 105 (09/14 0700) Cardiac Rhythm: Normal sinus rhythm (09/14 0400) Resp:  [13-27] 21 (09/14 0700) BP: (92-127)/(60-86) 123/86 (09/14 0700) SpO2:  [95 %-100 %] 98 % (09/14 0700) Weight:  [120 kg] (P) 120 kg (09/14 0600)  Hemodynamic parameters for last 24 hours:    Intake/Output from previous day: 09/13 0701 - 09/14 0700 In: 503.4 [P.O.:360; I.V.:143.4] Out: 470 [Urine:470] Intake/Output this shift: No intake/output data recorded.  General appearance: alert, cooperative, and no distress Neurologic: intact Heart: regular rate and rhythm Lungs: diminished breath sounds bibasilar Extremities: + edema Wound: clean and dry  Lab Results: Recent Labs    01/24/21 0413 01/25/21 0424  WBC 10.2 9.9  HGB 8.2* 8.0*  HCT 25.7* 25.6*  PLT 255 325   BMET:  Recent Labs    01/24/21 0413 01/25/21 0424  NA 133* 135  K 3.9 4.4  CL 99 104  CO2 25 23  GLUCOSE 92 100*  BUN 49* 44*  CREATININE 1.28* 1.21  CALCIUM 8.0* 8.1*    PT/INR: No results for input(s): LABPROT, INR in the last 72 hours. ABG    Component Value Date/Time   PHART 7.357 01/20/2021 2002   HCO3 20.9 01/20/2021 2002   TCO2 22 01/20/2021 2002   ACIDBASEDEF 4.0 (H) 01/20/2021 2002   O2SAT 61.2 01/25/2021 0424   CBG (last 3)  Recent Labs    01/24/21 1603 01/24/21 2144 01/25/21 0605  GLUCAP 122* 89 87    Assessment/Plan: S/P Procedure(s) (LRB): CORONARY ARTERY BYPASS GRAFTING (CABG) X 3 ON PUMP, USING  LEFT INTERNAL MAMMARY ARTERY AND LEFT ENDOSCOPIC GREATER SAPHENOUS VEIN HARVEST CONDUITS (N/A) TRANSESOPHAGEAL ECHOCARDIOGRAM (TEE) (N/A) APPLICATION OF CELL SAVER ENDOVEIN HARVEST OF GREATER SAPHENOUS VEIN (Left) - POD # 5 Cv- s/p MI with emergent PCI, IABP support then CABG  In andn out of A fib on PO amiodarone, increase Coreg  Co-ox better with milrinone at 0.25, will try on 0.125 again today  Check echo to assess LV function and r/o pericardial effusion  RESP_ continue IS, diuresis RENAL- creatinine improved, continue PO lasix ENDO- CBG well controlled GI- tolerating PO SCD + enoxaparin for DVT prophylaxis Continue Ambulation- will consult PT to assist and assess home needs   LOS: 9 days    [name] 01/25/2021

## 2021-01-26 ENCOUNTER — Inpatient Hospital Stay (HOSPITAL_COMMUNITY): Payer: Medicare HMO

## 2021-01-26 ENCOUNTER — Other Ambulatory Visit (HOSPITAL_COMMUNITY): Payer: Self-pay

## 2021-01-26 DIAGNOSIS — I2102 ST elevation (STEMI) myocardial infarction involving left anterior descending coronary artery: Secondary | ICD-10-CM | POA: Diagnosis not present

## 2021-01-26 LAB — GLUCOSE, CAPILLARY
Glucose-Capillary: 130 mg/dL — ABNORMAL HIGH (ref 70–99)
Glucose-Capillary: 99 mg/dL (ref 70–99)
Glucose-Capillary: 98 mg/dL (ref 70–99)
Glucose-Capillary: 124 mg/dL — ABNORMAL HIGH (ref 70–99)

## 2021-01-26 LAB — CBC
HCT: 25.9 % — ABNORMAL LOW (ref 39.0–52.0)
Hemoglobin: 8.4 g/dL — ABNORMAL LOW (ref 13.0–17.0)
MCH: 29.7 pg (ref 26.0–34.0)
MCHC: 32.4 g/dL (ref 30.0–36.0)
MCV: 91.5 fL (ref 80.0–100.0)
Platelets: 407 10*3/uL — ABNORMAL HIGH (ref 150–400)
RBC: 2.83 MIL/uL — ABNORMAL LOW (ref 4.22–5.81)
RDW: 14.7 % (ref 11.5–15.5)
WBC: 8.9 10*3/uL (ref 4.0–10.5)
nRBC: 0 % (ref 0.0–0.2)

## 2021-01-26 LAB — COOXEMETRY PANEL
Carboxyhemoglobin: 1.1 % (ref 0.5–1.5)
Methemoglobin: 0.8 % (ref 0.0–1.5)
O2 Saturation: 68.7 %
Total hemoglobin: 8.3 g/dL — ABNORMAL LOW (ref 12.0–16.0)

## 2021-01-26 LAB — BASIC METABOLIC PANEL
Anion gap: 6 (ref 5–15)
BUN: 41 mg/dL — ABNORMAL HIGH (ref 8–23)
CO2: 25 mmol/L (ref 22–32)
Calcium: 8.1 mg/dL — ABNORMAL LOW (ref 8.9–10.3)
Chloride: 106 mmol/L (ref 98–111)
Creatinine, Ser: 1.28 mg/dL — ABNORMAL HIGH (ref 0.61–1.24)
GFR, Estimated: 59 mL/min — ABNORMAL LOW (ref >60)
Glucose, Bld: 113 mg/dL — ABNORMAL HIGH (ref 70–99)
Potassium: 4.2 mmol/L (ref 3.5–5.1)
Sodium: 137 mmol/L (ref 135–145)

## 2021-01-26 MED ORDER — SODIUM CHLORIDE 0.9% FLUSH: 3.0000 mL | INTRAVENOUS | Status: DC | PRN

## 2021-01-26 MED ORDER — SODIUM CHLORIDE 0.9 % IV SOLN: 250.0000 mL | INTRAVENOUS | Status: DC | PRN

## 2021-01-26 MED ORDER — ALUM & MAG HYDROXIDE-SIMETH 200-200-20 MG/5ML PO SUSP: 15.0000 mL | Freq: Four times a day (QID) | ORAL | Status: DC | PRN

## 2021-01-26 MED ORDER — EMPAGLIFLOZIN 10 MG PO TABS
10.0000 mg | ORAL_TABLET | Freq: Every day | ORAL | Status: DC
  Administered 2021-01-26 – 2021-01-28 (×3): 10 mg via ORAL
  Filled 2021-01-26 (×3): qty 1

## 2021-01-26 MED ORDER — DAPAGLIFLOZIN PROPANEDIOL 10 MG PO TABS
10.0000 mg | ORAL_TABLET | Freq: Every day | ORAL | Status: DC
  Filled 2021-01-26: qty 1

## 2021-01-26 MED ORDER — SODIUM CHLORIDE 0.9% FLUSH
3.0000 mL | Freq: Two times a day (BID) | INTRAVENOUS | Status: DC
  Administered 2021-01-26 – 2021-01-29 (×6): 3 mL via INTRAVENOUS

## 2021-01-26 MED ORDER — OXYCODONE HCL 5 MG PO TABS: 5.0000 mg | ORAL_TABLET | ORAL | Status: DC | PRN

## 2021-01-26 MED ORDER — MAGNESIUM HYDROXIDE 400 MG/5ML PO SUSP: 30.0000 mL | Freq: Every day | ORAL | Status: DC | PRN

## 2021-01-26 MED ORDER — ~~LOC~~ CARDIAC SURGERY, PATIENT & FAMILY EDUCATION
Freq: Once | Status: AC
  Administered 2021-01-26: 1

## 2021-01-26 MED FILL — Sodium Chloride IV Soln 0.9%: INTRAVENOUS | Qty: 4000 | Status: AC

## 2021-01-26 MED FILL — Mannitol IV Soln 20%: INTRAVENOUS | Qty: 500 | Status: AC

## 2021-01-26 MED FILL — Heparin Sodium (Porcine) Inj 1000 Unit/ML: INTRAMUSCULAR | Qty: 20 | Status: AC

## 2021-01-26 MED FILL — Lidocaine HCl Local Preservative Free (PF) Inj 1%: INTRAMUSCULAR | Qty: 5 | Status: AC

## 2021-01-26 MED FILL — Electrolyte-R (PH 7.4) Solution: INTRAVENOUS | Qty: 3000 | Status: AC

## 2021-01-26 MED FILL — Sodium Bicarbonate IV Soln 8.4%: INTRAVENOUS | Qty: 100 | Status: AC

## 2021-01-26 MED FILL — Calcium Chloride Inj 10%: INTRAVENOUS | Qty: 10 | Status: AC

## 2021-01-26 NOTE — Progress Notes (Signed)
CARDIAC REHAB PHASE I   PRE:  Rate/Rhythm: 82 SR    BP: sitting 114/77    SaO2: 97 RA  MODE:  Ambulation: 370 ft   POST:  Rate/Rhythm: 90 SR    BP: sitting 123/70     SaO2: 98 RA  Pt stood with rocking, legs somewhat weak. To BR for BM. Stood from toilet with min assist and rocking. Pt opted to walk without RW. Min assist with gait belt, legs weak with inconsistent placement of steps. More tired after walk. Pt agrees to use RW for now for more independence. To EOB, VSS. Eating now. Encouraged IS and flutter. [phone]   Harriet Masson CES, ACSM 01/26/2021 11:49 AM

## 2021-01-26 NOTE — Progress Notes (Signed)
6 Days Post-Op Procedure(s) (LRB): CORONARY ARTERY BYPASS GRAFTING (CABG) X 3 ON PUMP, USING LEFT INTERNAL MAMMARY ARTERY AND LEFT ENDOSCOPIC GREATER SAPHENOUS VEIN HARVEST CONDUITS (N/A) TRANSESOPHAGEAL ECHOCARDIOGRAM (TEE) (N/A) APPLICATION OF CELL SAVER ENDOVEIN HARVEST OF GREATER SAPHENOUS VEIN (Left) Subjective: No complaints this AM  Objective: Vital signs in last 24 hours: Temp:  [97.6 F (36.4 C)-98.3 F (36.8 C)] 97.9 F (36.6 C) (09/15 0700) Pulse Rate:  [74-88] 79 (09/15 0600) Cardiac Rhythm: Normal sinus rhythm (09/15 0336) Resp:  [15-24] 17 (09/15 0600) BP: (100-125)/(61-97) 109/75 (09/15 0600) SpO2:  [95 %-100 %] 95 % (09/15 0600) Weight:  [121.1 kg] 121.1 kg (09/15 0500)  Hemodynamic parameters for last 24 hours:    Intake/Output from previous day: 09/14 0701 - 09/15 0700 In: 737.7 [P.O.:600; I.V.:137.7] Out: 1025 [Urine:1025] Intake/Output this shift: No intake/output data recorded.  General appearance: alert, cooperative, and no distress Neurologic: intact Heart: regular rate and rhythm Lungs: clear to auscultation bilaterally Extremities: edema 2+  Lab Results: Recent Labs    01/25/21 0424 01/26/21 0326  WBC 9.9 8.9  HGB 8.0* 8.4*  HCT 25.6* 25.9*  PLT 325 407*   BMET:  Recent Labs    01/25/21 0424 01/26/21 0326  NA 135 137  K 4.4 4.2  CL 104 106  CO2 23 25  GLUCOSE 100* 113*  BUN 44* 41*  CREATININE 1.21 1.28*  CALCIUM 8.1* 8.1*    PT/INR: No results for input(s): LABPROT, INR in the last 72 hours. ABG    Component Value Date/Time   PHART 7.357 01/20/2021 2002   HCO3 20.9 01/20/2021 2002   TCO2 22 01/20/2021 2002   ACIDBASEDEF 4.0 (H) 01/20/2021 2002   O2SAT 68.7 01/26/2021 0326   CBG (last 3)  Recent Labs    01/25/21 1608 01/25/21 2153 01/26/21 0705  GLUCAP 125* 111* 130*    Assessment/Plan: S/P Procedure(s) (LRB): CORONARY ARTERY BYPASS GRAFTING (CABG) X 3 ON PUMP, USING LEFT INTERNAL MAMMARY ARTERY AND LEFT  ENDOSCOPIC GREATER SAPHENOUS VEIN HARVEST CONDUITS (N/A) TRANSESOPHAGEAL ECHOCARDIOGRAM (TEE) (N/A) APPLICATION OF CELL SAVER ENDOVEIN HARVEST OF GREATER SAPHENOUS VEIN (Left) Plan for transfer to step-down: see transfer orders Looks great today NEURO- intact CV- in SR on amiodarone, dig and coreg  Co-ox up to 68- dc milrinone and central line  ECHO- EF 40-45%, anterior MI RESP- continue IS RENAL- creatinine up slightly, monitor  Continue diuresis ENDO- CBG well controlled GI- tolerating PO Continue ambulation SCD + enoxaparin   LOS: 10 days    [name] 01/26/2021

## 2021-01-26 NOTE — Progress Notes (Signed)
Progress Note  Patient Name: Timothy Munoz Date of Encounter: 01/26/2021  Primary Cardiologist:   None   Subjective   Weak.  Usual post op pain.   Inpatient Medications    Scheduled Meds:  amiodarone  400 mg Oral BID   aspirin EC  325 mg Oral Daily   Or   aspirin  324 mg Per Tube Daily   atorvastatin  80 mg Oral Daily   bisacodyl  10 mg Oral Daily   Or   bisacodyl  10 mg Rectal Daily   carvedilol  6.25 mg Oral BID WC   Chlorhexidine Gluconate Cloth  6 each Topical Daily   digoxin  0.125 mg Oral Daily   docusate sodium  200 mg Oral Daily   enoxaparin (LOVENOX) injection  40 mg Subcutaneous QHS   furosemide  40 mg Oral Daily   guaiFENesin  600 mg Oral BID   influenza vaccine adjuvanted  0.5 mL Intramuscular Tomorrow-1000   insulin aspart  0-24 Units Subcutaneous TID WC   mouth rinse  15 mL Mouth Rinse BID   melatonin  3 mg Oral QHS   pantoprazole  40 mg Oral Daily   primidone  50 mg Oral QHS   sodium chloride flush  10-40 mL Intracatheter Q12H   sodium chloride flush  3 mL Intravenous Q12H   Continuous Infusions:  sodium chloride     sodium chloride 250 mL (01/21/21 0500)   sodium chloride     lactated ringers Stopped (01/22/21 0547)   lactated ringers     milrinone 0.125 mcg/kg/min (01/26/21 0700)   PRN Meds: sodium chloride, metoprolol tartrate, morphine injection, ondansetron (ZOFRAN) IV, oxyCODONE, sodium chloride flush, sodium chloride flush, traMADol   Vital Signs    Vitals:   01/26/21 0400 01/26/21 0500 01/26/21 0600 01/26/21 0700  BP: 104/70 108/70 109/75   Pulse: 76 76 79   Resp: 15 17 17    Temp:    97.9 F (36.6 C)  TempSrc:    Oral  SpO2: 96% 95% 95%   Weight:  121.1 kg    Height:        Intake/Output Summary (Last 24 hours) at 01/26/2021 0729 Last data filed at 01/26/2021 0700 Gross per 24 hour  Intake 737.74 ml  Output 1025 ml  Net -287.26 ml   Filed Weights   01/24/21 0600 01/25/21 0600 01/26/21 0500  Weight: 121.2 kg 120 kg  121.1 kg    Telemetry    NSR - Personally Reviewed  ECG    NA - Personally Reviewed  Physical Exam   GEN: No acute distress.   Neck: No  JVD Cardiac: RRR, no murmurs, rubs, or gallops.  Respiratory: Decreased breath sounds bilaterally.   GI: Soft, nontender, non-distended  MS:     Diffuse edema; No deformity. Neuro:  Nonfocal  Psych: Normal affect   Labs    Chemistry Recent Labs  Lab 01/24/21 0413 01/25/21 0424 01/26/21 0326  NA 133* 135 137  K 3.9 4.4 4.2  CL 99 104 106  CO2 25 23 25   GLUCOSE 92 100* 113*  BUN 49* 44* 41*  CREATININE 1.28* 1.21 1.28*  CALCIUM 8.0* 8.1* 8.1*  GFRNONAA 59* >60 59*  ANIONGAP 9 8 6      Hematology Recent Labs  Lab 01/24/21 0413 01/25/21 0424 01/26/21 0326  WBC 10.2 9.9 8.9  RBC 2.86* 2.79* 2.83*  HGB 8.2* 8.0* 8.4*  HCT 25.7* 25.6* 25.9*  MCV 89.9 91.8 91.5  MCH 28.7 28.7  29.7  MCHC 31.9 31.3 32.4  RDW 14.5 14.7 14.7  PLT 255 325 407*    Cardiac EnzymesNo results for input(s): TROPONINI in the last 168 hours. No results for input(s): TROPIPOC in the last 168 hours.   BNPNo results for input(s): BNP, PROBNP in the last 168 hours.   DDimer No results for input(s): DDIMER in the last 168 hours.   Radiology    ECHOCARDIOGRAM COMPLETE  Result Date: 01/25/2021    ECHOCARDIOGRAM REPORT   Patient Name:   Timothy Munoz Date of Exam: 01/25/2021 Medical Rec #:  [id number]       Height:       75.0 in Accession #:    [id number]      Weight:       264.6 lb Date of Birth:  1946/03/25       BSA:          2.471 m Patient Age:    75 years        BP:           115/76 mmHg Patient Gender: M               HR:           85 bpm. Exam Location:  Inpatient Procedure: 2D Echo, Cardiac Doppler, Color Doppler and 3D Echo Indications:    Myocardial Infact I21,9  History:        Patient has prior history of Echocardiogram examinations, most                 recent 01/20/2021. CAD; Prior CABG. Cardiogenic shock_EF 40 to 45%                 during this  admit with normal RV function Acute systolic heart                 failure. Paroxysmal Atrial fibrillation.  Sonographer:    Leta Jungling RDCS Referring Phys: [id number] STEVEN C HENDRICKSON IMPRESSIONS  1. Global hypokinesis. Anteroseptal, mid-apical inferoseptal, apicl inferior, and apical akinesis. Left ventricular ejection fraction, by estimation, is 40 to 45%. The left ventricle has mildly decreased function. The left ventricle demonstrates regional wall motion abnormalities (see scoring diagram/findings for description). There is moderate asymmetric left ventricular hypertrophy of the basal-septal segment. Left ventricular diastolic parameters are consistent with Grade II diastolic dysfunction (pseudonormalization). The average left ventricular global longitudinal strain is -8.8 %. The global longitudinal strain is abnormal.  2. Right ventricular systolic function is normal. The right ventricular size is normal. There is normal pulmonary artery systolic pressure.  3. Small pericarcdial effusion measuring 0.74 cm. a small pericardial effusion is present. The pericardial effusion is posterior to the left ventricle.  4. The mitral valve is normal in structure. No evidence of mitral valve regurgitation. No evidence of mitral stenosis.  5. The aortic valve is tricuspid. Aortic valve regurgitation is not visualized. No aortic stenosis is present.  6. Aortic dilatation noted. There is mild dilatation of the ascending aorta, measuring 41 mm. There is mild dilatation of the aortic root, measuring 43 mm.  7. The inferior vena cava is normal in size with <50% respiratory variability, suggesting right atrial pressure of 8 mmHg. FINDINGS  Left Ventricle: Global hypokinesis. Anteroseptal, mid-apical inferoseptal, apicl inferior, and apical akinesis. Left ventricular ejection fraction, by estimation, is 40 to 45%. The left ventricle has mildly decreased function. The left ventricle demonstrates regional wall motion  abnormalities. The average left ventricular global longitudinal strain  is -8.8 %. The global longitudinal strain is abnormal. The left ventricular internal cavity size was normal in size. There is moderate asymmetric left ventricular hypertrophy of the basal-septal segment. Left ventricular diastolic parameters are consistent with Grade II diastolic dysfunction (pseudonormalization). Indeterminate filling pressures. Right Ventricle: The right ventricular size is normal. No increase in right ventricular wall thickness. Right ventricular systolic function is normal. There is normal pulmonary artery systolic pressure. The tricuspid regurgitant velocity is 1.96 m/s, and  with an assumed right atrial pressure of 8 mmHg, the estimated right ventricular systolic pressure is 23.4 mmHg. Left Atrium: Left atrial size was normal in size. Right Atrium: Right atrial size was normal in size. Pericardium: Small pericarcdial effusion measuring 0.74 cm. A small pericardial effusion is present. The pericardial effusion is posterior to the left ventricle. Mitral Valve: The mitral valve is normal in structure. No evidence of mitral valve regurgitation. No evidence of mitral valve stenosis. Tricuspid Valve: The tricuspid valve is normal in structure. Tricuspid valve regurgitation is trivial. No evidence of tricuspid stenosis. Aortic Valve: The aortic valve is tricuspid. Aortic valve regurgitation is not visualized. No aortic stenosis is present. Pulmonic Valve: The pulmonic valve was normal in structure. Pulmonic valve regurgitation is trivial. No evidence of pulmonic stenosis. Aorta: Aortic dilatation noted. There is mild dilatation of the ascending aorta, measuring 41 mm. There is mild dilatation of the aortic root, measuring 43 mm. Venous: The inferior vena cava is normal in size with less than 50% respiratory variability, suggesting right atrial pressure of 8 mmHg. IAS/Shunts: No atrial level shunt detected by color flow Doppler.   LEFT VENTRICLE PLAX 2D LVIDd:         4.60 cm  Diastology LVIDs:         3.40 cm  LV e' medial:    6.10 cm/s LV PW:         0.80 cm  LV E/e' medial:  14.6 LV IVS:        1.50 cm  LV e' lateral:   5.69 cm/s LVOT diam:     2.00 cm  LV E/e' lateral: 15.6 LV SV:         61 LV SV Index:   24       2D Longitudinal Strain LVOT Area:     3.14 cm 2D Strain GLS (A2C):   -10.7 %                         2D Strain GLS (A3C):   -4.7 %                         2D Strain GLS (A4C):   -11.1 %                         2D Strain GLS Avg:     -8.8 %                          3D Volume EF:                         3D EF:        49 %                         LV EDV:       218  ml                         LV ESV:       111 ml                         LV SV:        107 ml RIGHT VENTRICLE RV S prime:     11.50 cm/s TAPSE (M-mode): 1.2 cm LEFT ATRIUM             Index       RIGHT ATRIUM           Index LA diam:        3.20 cm 1.29 cm/m  RA Area:     12.80 cm LA Vol (A2C):   35.7 ml 14.45 ml/m RA Volume:   24.20 ml  9.79 ml/m LA Vol (A4C):   16.6 ml 6.72 ml/m LA Biplane Vol: 25.5 ml 10.32 ml/m  AORTIC VALVE LVOT Vmax:   119.67 cm/s LVOT Vmean:  75.400 cm/s LVOT VTI:    0.193 m  AORTA Ao Root diam: 4.30 cm Ao Asc diam:  4.10 cm MITRAL VALVE               TRICUSPID VALVE MV Area (PHT): 4.64 cm    TR Peak grad:   15.4 mmHg MV Decel Time: 163 msec    TR Vmax:        195.95 cm/s MV E velocity: 88.90 cm/s MV A velocity: 72.93 cm/s  SHUNTS MV E/A ratio:  1.22        Systemic VTI:  0.19 m                            Systemic Diam: 2.00 cm Chilton Si MD Electronically signed by Chilton Si MD Signature Date/Time: 01/25/2021/4:17:47 PM    Final     Cardiac Studies   Echo:  1. Global hypokinesis. Anteroseptal, mid-apical inferoseptal, apical   inferior, and apical akinesis. Left ventricular ejection fraction, by  estimation, is 40 to 45%. The left ventricle has mildly decreased  function. The left ventricle demonstrates  regional wall  motion abnormalities (see scoring diagram/findings for  description). There is moderate asymmetric left ventricular hypertrophy of  the basal-septal segment. Left ventricular diastolic parameters are  consistent with Grade II diastolic  dysfunction (pseudonormalization). The average left ventricular global  longitudinal strain is -8.8 %. The global longitudinal strain is abnormal.   2. Right ventricular systolic function is normal. The right ventricular  size is normal. There is normal pulmonary artery systolic pressure.   3. Small pericarcdial effusion measuring 0.74 cm. a small pericardial  effusion is present. The pericardial effusion is posterior to the left  ventricle.   4. The mitral valve is normal in structure. No evidence of mitral valve  regurgitation. No evidence of mitral stenosis.   5. The aortic valve is tricuspid. Aortic valve regurgitation is not  visualized. No aortic stenosis is present.   6. Aortic dilatation noted. There is mild dilatation of the ascending  aorta, measuring 41 mm. There is mild dilatation of the aortic root,  measuring 43 mm.   7. The inferior vena cava is normal in size with <50% respiratory  variability, suggesting right atrial pressure of 8 mmHg.   Patient Profile     75 y.o. male with anterior lateral STEMI with balloon  angioplasty of ostial LAD suboptimal, cardiogenic shock, EF 35%, intra-aortic balloon pump for support, surgical revascularization post CABG.  Assessment & Plan    Cardiogenic shock:  Mildly reduced EF as above unchanged from preop.  CoOx up from 48.1 to 68.7 with and able to reduce to 0.125 mcg. Now off.    On PO Lasix.  Will add SGLT2i.  We will continue to titrate likely Entresto prior to discharge.    PAF:  On amiodarone, digoxin and tolerating PO beta blocker.   Coreg dose increased yesterday.  Stop digoxin.    For questions or updates, please contact [facility] Please consult [email or web address] for contact info under  Cardiology/STEMI.   Signed, Rollene Rotunda, MD  01/26/2021, 7:29 AM

## 2021-01-26 NOTE — Progress Notes (Signed)
EVENING ROUNDS NOTE :     [address]       [place] [address]             [phone]                 6 Days Post-Op Procedure(s) (LRB): CORONARY ARTERY BYPASS GRAFTING (CABG) X 3 ON PUMP, USING LEFT INTERNAL MAMMARY ARTERY AND LEFT ENDOSCOPIC GREATER SAPHENOUS VEIN HARVEST CONDUITS (N/A) TRANSESOPHAGEAL ECHOCARDIOGRAM (TEE) (N/A) APPLICATION OF CELL SAVER ENDOVEIN HARVEST OF GREATER SAPHENOUS VEIN (Left)   Total Length of Stay:  LOS: 10 days  Events:   No events Ambulated twice Off milr Awaiting floor bed     BP (!) 98/55   Pulse 73   Temp 97.9 F (36.6 C) (Oral)   Resp 16   Ht 6\' 3"  (1.905 m)   Wt 121.1 kg   SpO2 97%   BMI 33.37 kg/m          sodium chloride     sodium chloride 250 mL (01/21/21 0500)   sodium chloride     lactated ringers Stopped (01/22/21 0547)   lactated ringers      I/O last 3 completed shifts: In: 1193.4 [P.O.:960; I.V.:233.4] Out: 1495 [Urine:1495]   CBC Latest Ref Rng & Units 01/26/2021 01/25/2021 01/24/2021  WBC 4.0 - 10.5 K/uL 8.9 9.9 10.2  Hemoglobin 13.0 - 17.0 g/dL 01/26/2021) 1.1(H) 4.1(D)  Hematocrit 39.0 - 52.0 % 25.9(L) 25.6(L) 25.7(L)  Platelets 150 - 400 K/uL 407(H) 325 255    BMP Latest Ref Rng & Units 01/26/2021 01/25/2021 01/24/2021  Glucose 70 - 99 mg/dL 01/26/2021) 144(Y) 92  BUN 8 - 23 mg/dL 185(U) 31(S) 97(W)  Creatinine 0.61 - 1.24 mg/dL 26(V) 7.85(Y 8.50)  Sodium 135 - 145 mmol/L 137 135 133(L)  Potassium 3.5 - 5.1 mmol/L 4.2 4.4 3.9  Chloride 98 - 111 mmol/L 106 104 99  CO2 22 - 32 mmol/L 25 23 25   Calcium 8.9 - 10.3 mg/dL 8.1(L) 8.1(L) 8.0(L)    ABG    Component Value Date/Time   PHART 7.357 01/20/2021 2002   PCO2ART 37.6 01/20/2021 2002   PO2ART 83 01/20/2021 2002   HCO3 20.9 01/20/2021 2002   TCO2 22 01/20/2021 2002   ACIDBASEDEF 4.0 (H) 01/20/2021 2002   O2SAT 68.7 01/26/2021 0326       03/22/2021, MD 01/26/2021 4:28 PM

## 2021-01-27 DIAGNOSIS — I2102 ST elevation (STEMI) myocardial infarction involving left anterior descending coronary artery: Secondary | ICD-10-CM | POA: Diagnosis not present

## 2021-01-27 LAB — BASIC METABOLIC PANEL
Anion gap: 12 (ref 5–15)
BUN: 37 mg/dL — ABNORMAL HIGH (ref 8–23)
CO2: 24 mmol/L (ref 22–32)
Calcium: 8.6 mg/dL — ABNORMAL LOW (ref 8.9–10.3)
Chloride: 100 mmol/L (ref 98–111)
Creatinine, Ser: 1.33 mg/dL — ABNORMAL HIGH (ref 0.61–1.24)
GFR, Estimated: 56 mL/min — ABNORMAL LOW (ref >60)
Glucose, Bld: 104 mg/dL — ABNORMAL HIGH (ref 70–99)
Potassium: 4.3 mmol/L (ref 3.5–5.1)
Sodium: 136 mmol/L (ref 135–145)

## 2021-01-27 LAB — CBC
HCT: 28.3 % — ABNORMAL LOW (ref 39.0–52.0)
Hemoglobin: 8.8 g/dL — ABNORMAL LOW (ref 13.0–17.0)
MCH: 28.7 pg (ref 26.0–34.0)
MCHC: 31.1 g/dL (ref 30.0–36.0)
MCV: 92.2 fL (ref 80.0–100.0)
Platelets: 492 10*3/uL — ABNORMAL HIGH (ref 150–400)
RBC: 3.07 MIL/uL — ABNORMAL LOW (ref 4.22–5.81)
RDW: 14.9 % (ref 11.5–15.5)
WBC: 10.1 10*3/uL (ref 4.0–10.5)
nRBC: 0 % (ref 0.0–0.2)

## 2021-01-27 LAB — GLUCOSE, CAPILLARY: Glucose-Capillary: 113 mg/dL — ABNORMAL HIGH (ref 70–99)

## 2021-01-27 MED ORDER — CARVEDILOL 12.5 MG PO TABS
12.5000 mg | ORAL_TABLET | Freq: Two times a day (BID) | ORAL | Status: DC
  Administered 2021-01-27 – 2021-01-28 (×3): 12.5 mg via ORAL
  Filled 2021-01-27 (×3): qty 1

## 2021-01-27 NOTE — Progress Notes (Signed)
Pt ambulated x2 earlier this am. Resting now. Will amb after lunch with RW to access need for home.   Discussed with pt IS, sternal precautions, diet, exercise, tobacco cessation and CRPII. Pt receptive. Will refer to G[facility] CRPII. He is motivated to quit smoking. He is going to his daughters house for a while at d/c. [phone] Ethelda Chick CES, ACSM 10:51 AM 01/27/2021

## 2021-01-27 NOTE — Progress Notes (Addendum)
Progress Note  Patient Name: Timothy Munoz Date of Encounter: 01/27/2021  Primary Cardiologist:   None   Subjective   Feels weak.  No acute SOB.  Walked in the unit  Inpatient Medications    Scheduled Meds:  amiodarone  400 mg Oral BID   aspirin EC  325 mg Oral Daily   Or   aspirin  324 mg Per Tube Daily   atorvastatin  80 mg Oral Daily   bisacodyl  10 mg Oral Daily   Or   bisacodyl  10 mg Rectal Daily   carvedilol  12.5 mg Oral BID WC   Chlorhexidine Gluconate Cloth  6 each Topical Daily   docusate sodium  200 mg Oral Daily   empagliflozin  10 mg Oral Daily   enoxaparin (LOVENOX) injection  40 mg Subcutaneous QHS   furosemide  40 mg Oral Daily   guaiFENesin  600 mg Oral BID   melatonin  3 mg Oral QHS   pantoprazole  40 mg Oral Daily   primidone  50 mg Oral QHS   sodium chloride flush  3 mL Intravenous Q12H   Continuous Infusions:  sodium chloride     PRN Meds: sodium chloride, alum & mag hydroxide-simeth, magnesium hydroxide, metoprolol tartrate, ondansetron (ZOFRAN) IV, oxyCODONE, sodium chloride flush, traMADol   Vital Signs    Vitals:   01/27/21 0645 01/27/21 0700 01/27/21 0800 01/27/21 0900  BP:  121/77 110/76 94/62  Pulse:  79 77 66  Resp:  (!) 24 (!) 26 17  Temp: 98.3 F (36.8 C)     TempSrc: Oral     SpO2:  96% 97% 97%  Weight:      Height:        Intake/Output Summary (Last 24 hours) at 01/27/2021 1008 Last data filed at 01/27/2021 0954 Gross per 24 hour  Intake 480 ml  Output 1935 ml  Net -1455 ml   Filed Weights   01/25/21 0600 01/26/21 0500 01/27/21 0440  Weight: 120 kg 121.1 kg 118.8 kg    Telemetry    NSR - Personally Reviewed  ECG    NA - Personally Reviewed  Physical Exam   GEN: No  acute distress.   Neck: No  JVD Cardiac: RRR, no murmurs, rubs, or gallops.  Respiratory: Clear to auscultation bilaterally. GI: Soft, nontender, non-distended, normal bowel sounds  MS:  Mild diffuse edema; No deformity. Neuro:    Nonfocal  Psych: Oriented and appropriate    Labs    Chemistry Recent Labs  Lab 01/25/21 0424 01/26/21 0326 01/27/21 0051  NA 135 137 136  K 4.4 4.2 4.3  CL 104 106 100  CO2 23 25 24   GLUCOSE 100* 113* 104*  BUN 44* 41* 37*  CREATININE 1.21 1.28* 1.33*  CALCIUM 8.1* 8.1* 8.6*  GFRNONAA >60 59* 56*  ANIONGAP 8 6 12      Hematology Recent Labs  Lab 01/25/21 0424 01/26/21 0326 01/27/21 0051  WBC 9.9 8.9 10.1  RBC 2.79* 2.83* 3.07*  HGB 8.0* 8.4* 8.8*  HCT 25.6* 25.9* 28.3*  MCV 91.8 91.5 92.2  MCH 28.7 29.7 28.7  MCHC 31.3 32.4 31.1  RDW 14.7 14.7 14.9  PLT 325 407* 492*    Cardiac EnzymesNo results for input(s): TROPONINI in the last 168 hours. No results for input(s): TROPIPOC in the last 168 hours.   BNPNo results for input(s): BNP, PROBNP in the last 168 hours.   DDimer No results for input(s): DDIMER in the last 168  hours.   Radiology    DG Chest Port 1 View  Result Date: 01/26/2021 CLINICAL DATA:  Pleural effusion EXAM: PORTABLE CHEST 1 VIEW COMPARISON:  Radiograph 01/24/2021 FINDINGS: Unchanged right neck central venous catheter sheath. Prior median sternotomy and postsurgical changes of CABG. Unchanged, enlarged cardiomediastinal silhouette. Persistent interstitial opacities and left-sided pleural effusion and adjacent left basilar dense opacities. Unchanged small right pleural effusion. No visible pneumothorax. No acute osseous abnormality. IMPRESSION: Unchanged interstitial edema and bilateral, left greater than right pleural effusions with associated basilar atelectasis/consolidation. No new findings. Electronically Signed   By: Caprice Renshaw M.D.   On: 01/26/2021 09:06   ECHOCARDIOGRAM COMPLETE  Result Date: 01/25/2021    ECHOCARDIOGRAM REPORT   Patient Name:   Timothy Munoz Date of Exam: 01/25/2021 Medical Rec #:  [id number]       Height:       75.0 in Accession #:    [id number]      Weight:       264.6 lb Date of Birth:  11/20/1945       BSA:           2.471 m Patient Age:    75 years        BP:           115/76 mmHg Patient Gender: M               HR:           85 bpm. Exam Location:  Inpatient Procedure: 2D Echo, Cardiac Doppler, Color Doppler and 3D Echo Indications:    Myocardial Infact I21,9  History:        Patient has prior history of Echocardiogram examinations, most                 recent 01/20/2021. CAD; Prior CABG. Cardiogenic shock_EF 40 to 45%                 during this admit with normal RV function Acute systolic heart                 failure. Paroxysmal Atrial fibrillation.  Sonographer:    Leta Jungling RDCS Referring Phys: [id number] STEVEN C HENDRICKSON IMPRESSIONS  1. Global hypokinesis. Anteroseptal, mid-apical inferoseptal, apicl inferior, and apical akinesis. Left ventricular ejection fraction, by estimation, is 40 to 45%. The left ventricle has mildly decreased function. The left ventricle demonstrates regional wall motion abnormalities (see scoring diagram/findings for description). There is moderate asymmetric left ventricular hypertrophy of the basal-septal segment. Left ventricular diastolic parameters are consistent with Grade II diastolic dysfunction (pseudonormalization). The average left ventricular global longitudinal strain is -8.8 %. The global longitudinal strain is abnormal.  2. Right ventricular systolic function is normal. The right ventricular size is normal. There is normal pulmonary artery systolic pressure.  3. Small pericarcdial effusion measuring 0.74 cm. a small pericardial effusion is present. The pericardial effusion is posterior to the left ventricle.  4. The mitral valve is normal in structure. No evidence of mitral valve regurgitation. No evidence of mitral stenosis.  5. The aortic valve is tricuspid. Aortic valve regurgitation is not visualized. No aortic stenosis is present.  6. Aortic dilatation noted. There is mild dilatation of the ascending aorta, measuring 41 mm. There is mild dilatation of the aortic root,  measuring 43 mm.  7. The inferior vena cava is normal in size with <50% respiratory variability, suggesting right atrial pressure of 8 mmHg. FINDINGS  Left Ventricle: Global hypokinesis. Anteroseptal, mid-apical  inferoseptal, apicl inferior, and apical akinesis. Left ventricular ejection fraction, by estimation, is 40 to 45%. The left ventricle has mildly decreased function. The left ventricle demonstrates regional wall motion abnormalities. The average left ventricular global longitudinal strain is -8.8 %. The global longitudinal strain is abnormal. The left ventricular internal cavity size was normal in size. There is moderate asymmetric left ventricular hypertrophy of the basal-septal segment. Left ventricular diastolic parameters are consistent with Grade II diastolic dysfunction (pseudonormalization). Indeterminate filling pressures. Right Ventricle: The right ventricular size is normal. No increase in right ventricular wall thickness. Right ventricular systolic function is normal. There is normal pulmonary artery systolic pressure. The tricuspid regurgitant velocity is 1.96 m/s, and  with an assumed right atrial pressure of 8 mmHg, the estimated right ventricular systolic pressure is 23.4 mmHg. Left Atrium: Left atrial size was normal in size. Right Atrium: Right atrial size was normal in size. Pericardium: Small pericarcdial effusion measuring 0.74 cm. A small pericardial effusion is present. The pericardial effusion is posterior to the left ventricle. Mitral Valve: The mitral valve is normal in structure. No evidence of mitral valve regurgitation. No evidence of mitral valve stenosis. Tricuspid Valve: The tricuspid valve is normal in structure. Tricuspid valve regurgitation is trivial. No evidence of tricuspid stenosis. Aortic Valve: The aortic valve is tricuspid. Aortic valve regurgitation is not visualized. No aortic stenosis is present. Pulmonic Valve: The pulmonic valve was normal in structure. Pulmonic  valve regurgitation is trivial. No evidence of pulmonic stenosis. Aorta: Aortic dilatation noted. There is mild dilatation of the ascending aorta, measuring 41 mm. There is mild dilatation of the aortic root, measuring 43 mm. Venous: The inferior vena cava is normal in size with less than 50% respiratory variability, suggesting right atrial pressure of 8 mmHg. IAS/Shunts: No atrial level shunt detected by color flow Doppler.  LEFT VENTRICLE PLAX 2D LVIDd:         4.60 cm  Diastology LVIDs:         3.40 cm  LV e' medial:    6.10 cm/s LV PW:         0.80 cm  LV E/e' medial:  14.6 LV IVS:        1.50 cm  LV e' lateral:   5.69 cm/s LVOT diam:     2.00 cm  LV E/e' lateral: 15.6 LV SV:         61 LV SV Index:   24       2D Longitudinal Strain LVOT Area:     3.14 cm 2D Strain GLS (A2C):   -10.7 %                         2D Strain GLS (A3C):   -4.7 %                         2D Strain GLS (A4C):   -11.1 %                         2D Strain GLS Avg:     -8.8 %                          3D Volume EF:                         3D EF:  49 %                         LV EDV:       218 ml                         LV ESV:       111 ml                         LV SV:        107 ml RIGHT VENTRICLE RV S prime:     11.50 cm/s TAPSE (M-mode): 1.2 cm LEFT ATRIUM             Index       RIGHT ATRIUM           Index LA diam:        3.20 cm 1.29 cm/m  RA Area:     12.80 cm LA Vol (A2C):   35.7 ml 14.45 ml/m RA Volume:   24.20 ml  9.79 ml/m LA Vol (A4C):   16.6 ml 6.72 ml/m LA Biplane Vol: 25.5 ml 10.32 ml/m  AORTIC VALVE LVOT Vmax:   119.67 cm/s LVOT Vmean:  75.400 cm/s LVOT VTI:    0.193 m  AORTA Ao Root diam: 4.30 cm Ao Asc diam:  4.10 cm MITRAL VALVE               TRICUSPID VALVE MV Area (PHT): 4.64 cm    TR Peak grad:   15.4 mmHg MV Decel Time: 163 msec    TR Vmax:        195.95 cm/s MV E velocity: 88.90 cm/s MV A velocity: 72.93 cm/s  SHUNTS MV E/A ratio:  1.22        Systemic VTI:  0.19 m                            Systemic  Diam: 2.00 cm Chilton Si MD Electronically signed by Chilton Si MD Signature Date/Time: 01/25/2021/4:17:47 PM    Final     Cardiac Studies   Echo:  1. Global hypokinesis. Anteroseptal, mid-apical inferoseptal, apical   inferior, and apical akinesis. Left ventricular ejection fraction, by  estimation, is 40 to 45%. The left ventricle has mildly decreased  function. The left ventricle demonstrates  regional wall motion abnormalities (see scoring diagram/findings for  description). There is moderate asymmetric left ventricular hypertrophy of  the basal-septal segment. Left ventricular diastolic parameters are  consistent with Grade II diastolic  dysfunction (pseudonormalization). The average left ventricular global  longitudinal strain is -8.8 %. The global longitudinal strain is abnormal.   2. Right ventricular systolic function is normal. The right ventricular  size is normal. There is normal pulmonary artery systolic pressure.   3. Small pericarcdial effusion measuring 0.74 cm. a small pericardial  effusion is present. The pericardial effusion is posterior to the left  ventricle.   4. The mitral valve is normal in structure. No evidence of mitral valve  regurgitation. No evidence of mitral stenosis.   5. The aortic valve is tricuspid. Aortic valve regurgitation is not  visualized. No aortic stenosis is present.   6. Aortic dilatation noted. There is mild dilatation of the ascending  aorta, measuring 41 mm. There is mild dilatation of the aortic root,  measuring 43 mm.   7. The inferior  vena cava is normal in size with <50% respiratory  variability, suggesting right atrial pressure of 8 mmHg.   Patient Profile     75 y.o. male with anterior lateral STEMI with balloon angioplasty of ostial LAD suboptimal, cardiogenic shock, EF 35%, intra-aortic balloon pump for support, surgical revascularization post CABG.  Assessment & Plan    ISCHEMIC CARDIOMYPATHY:  Mildly reduced EF  as above unchanged from preop.  Off of milrinone.  He had his Coreg dose increased this morning.  BP is too low to consider other med titration at this point.    Added Jardiance yesterday.  Continue Lasix.    PAF:  On amiodarone, digoxin and tolerating PO beta blocker.   Coreg dose increased yesterday.    For questions or updates, please contact [facility] Please consult [email or web address] for contact info under Cardiology/STEMI.   Signed, Rollene Rotunda, MD  01/27/2021, 10:08 AM

## 2021-01-27 NOTE — Progress Notes (Signed)
CARDIAC REHAB PHASE I   PRE:  Rate/Rhythm: 71 SR    BP: sitting 100/61    SaO2: 98 RA  MODE:  Ambulation: 640 ft   POST:  Rate/Rhythm: 90 SR    BP: sitting 116/68     SaO2: 98 RA  Pt moved out of bed independently with HOB elevated. Walked with RW. Steadier with RW than without AD (compared to yesterday). Pt has tendency to scissor right leg and therefore keep a narrow base. Encouraged wider stance but did not maintain long. He will benefit from RW or rollator at home. To recliner, VSS.  1327-1400   Harriet Masson CES, ACSM 01/27/2021 2:14 PM

## 2021-01-27 NOTE — Progress Notes (Addendum)
      [address]       [place] [address]             [phone]      7 Days Post-Op  Procedure(s) (LRB): CORONARY ARTERY BYPASS GRAFTING (CABG) X 3 ON PUMP, USING LEFT INTERNAL MAMMARY ARTERY AND LEFT ENDOSCOPIC GREATER SAPHENOUS VEIN HARVEST CONDUITS (N/A) TRANSESOPHAGEAL ECHOCARDIOGRAM (TEE) (N/A) APPLICATION OF CELL SAVER ENDOVEIN HARVEST OF GREATER SAPHENOUS VEIN (Left)   Total Length of Stay:  LOS: 11 days    SUBJECTIVE: No new concerns.  Good effort with ambulation today.   Vitals:   01/27/21 1706 01/27/21 1800  BP: 115/69 126/81  Pulse: 74 75  Resp: (!) 22 (!) 23  Temp:    SpO2: 98% 97%    Intake/Output      09/15 0701 09/16 0700 09/16 0701 09/17 0700   P.O. 960    I.V. (mL/kg) 4.5 (0) 0 (0)   Total Intake(mL/kg) 964.5 (8.1) 0 (0)   Urine (mL/kg/hr) 1660 (0.6) 1300 (0.9)   Emesis/NG output  0   Other  0   Stool 0 0   Blood  0   Total Output 1660 1300   Net -695.5 -1300        Urine Occurrence  0 x   Stool Occurrence 2 x 0 x   Emesis Occurrence  0 x       sodium chloride      CBC    Component Value Date/Time   WBC 10.1 01/27/2021 0051   RBC 3.07 (L) 01/27/2021 0051   HGB 8.8 (L) 01/27/2021 0051   HCT 28.3 (L) 01/27/2021 0051   PLT 492 (H) 01/27/2021 0051   MCV 92.2 01/27/2021 0051   MCH 28.7 01/27/2021 0051   MCHC 31.1 01/27/2021 0051   RDW 14.9 01/27/2021 0051   CMP     Component Value Date/Time   NA 136 01/27/2021 0051   K 4.3 01/27/2021 0051   CL 100 01/27/2021 0051   CO2 24 01/27/2021 0051   GLUCOSE 104 (H) 01/27/2021 0051   BUN 37 (H) 01/27/2021 0051   CREATININE 1.33 (H) 01/27/2021 0051   CREATININE 1.17 12/24/2016 1015   CALCIUM 8.6 (L) 01/27/2021 0051   PROT 6.4 (L) 01/16/2021 0739   ALBUMIN 3.7 01/16/2021 0739   AST 570 (H) 01/16/2021 0739   ALT 86 (H) 01/16/2021 0739   ALKPHOS 133 (H) 01/16/2021 0739   BILITOT 0.5 01/16/2021 0739   GFRNONAA 56 (L) 01/27/2021 0051   ABG    Component Value  Date/Time   PHART 7.357 01/20/2021 2002   PCO2ART 37.6 01/20/2021 2002   PO2ART 83 01/20/2021 2002   HCO3 20.9 01/20/2021 2002   TCO2 22 01/20/2021 2002   ACIDBASEDEF 4.0 (H) 01/20/2021 2002   O2SAT 68.7 01/26/2021 0326   CBG (last 3)  Recent Labs    01/26/21 1605 01/26/21 2128 01/27/21 0642  GLUCAP 98 124* 113*     ASSESSMENT: Continues to progress with mobility.  Maintaining SR on oral amio.  Awaiting transfer to 4E. Anticipate he will be ready for discharge soon.    Leary Roca, PA-C  Patient seen and examined, agree with above Waiting for tele bed  Salvatore Decent. Dorris Fetch, MD [facility] [phone]

## 2021-01-27 NOTE — Care Management (Signed)
    Durable Medical Equipment  (From admission, onward)           Start     Ordered   01/25/21 1551  For home use only DME 4 wheeled rolling walker with seat  Once       Question Answer Comment  Patient needs a walker to treat with the following condition S/P CABG (coronary artery bypass graft)   Patient needs a walker to treat with the following condition Physical deconditioning      01/25/21 1550   01/25/21 1551  For home use only DME Hospital bed  Once       Question Answer Comment  Length of Need 6 Months   Patient has (list medical condition): s/p cabg   The above medical condition requires: Patient requires the ability to reposition frequently   Bed type Semi-electric      01/25/21 1550

## 2021-01-27 NOTE — Care Management (Signed)
1226 01-27-21 Case Manager spoke with patients daughter regarding durable medical equipment. Case Manager ordered hospital bed and rollator via [employer]. [employer] will contact the patient's daughter for delivery times and dates.

## 2021-01-27 NOTE — Progress Notes (Signed)
7 Days Post-Op Procedure(s) (LRB): CORONARY ARTERY BYPASS GRAFTING (CABG) X 3 ON PUMP, USING LEFT INTERNAL MAMMARY ARTERY AND LEFT ENDOSCOPIC GREATER SAPHENOUS VEIN HARVEST CONDUITS (N/A) TRANSESOPHAGEAL ECHOCARDIOGRAM (TEE) (N/A) APPLICATION OF CELL SAVER ENDOVEIN HARVEST OF GREATER SAPHENOUS VEIN (Left) Subjective: No complaints this AM, Swelling a little better  Objective: Vital signs in last 24 hours: Temp:  [98.1 F (36.7 C)-98.6 F (37 C)] 98.3 F (36.8 C) (09/16 0645) Pulse Rate:  [68-87] 79 (09/16 0700) Cardiac Rhythm: Normal sinus rhythm (09/16 0400) Resp:  [15-30] 24 (09/16 0700) BP: (94-134)/(36-86) 121/77 (09/16 0700) SpO2:  [93 %-99 %] 96 % (09/16 0700) Weight:  [118.8 kg] 118.8 kg (09/16 0440)  Hemodynamic parameters for last 24 hours:    Intake/Output from previous day: 09/15 0701 - 09/16 0700 In: 964.5 [P.O.:960; I.V.:4.5] Out: 1660 [Urine:1660] Intake/Output this shift: No intake/output data recorded.  General appearance: alert, cooperative, and no distress Neurologic: intact Heart: regular rate and rhythm Lungs: diminished breath sounds bibasilar Abdomen: normal findings: soft, non-tender Extremities: edema slightly decreased Wound: clean and dry  Lab Results: Recent Labs    01/26/21 0326 01/27/21 0051  WBC 8.9 10.1  HGB 8.4* 8.8*  HCT 25.9* 28.3*  PLT 407* 492*   BMET:  Recent Labs    01/26/21 0326 01/27/21 0051  NA 137 136  K 4.2 4.3  CL 106 100  CO2 25 24  GLUCOSE 113* 104*  BUN 41* 37*  CREATININE 1.28* 1.33*  CALCIUM 8.1* 8.6*    PT/INR: No results for input(s): LABPROT, INR in the last 72 hours. ABG    Component Value Date/Time   PHART 7.357 01/20/2021 2002   HCO3 20.9 01/20/2021 2002   TCO2 22 01/20/2021 2002   ACIDBASEDEF 4.0 (H) 01/20/2021 2002   O2SAT 68.7 01/26/2021 0326   CBG (last 3)  Recent Labs    01/26/21 1605 01/26/21 2128 01/27/21 0642  GLUCAP 98 124* 113*    Assessment/Plan: S/P Procedure(s)  (LRB): CORONARY ARTERY BYPASS GRAFTING (CABG) X 3 ON PUMP, USING LEFT INTERNAL MAMMARY ARTERY AND LEFT ENDOSCOPIC GREATER SAPHENOUS VEIN HARVEST CONDUITS (N/A) TRANSESOPHAGEAL ECHOCARDIOGRAM (TEE) (N/A) APPLICATION OF CELL SAVER ENDOVEIN HARVEST OF GREATER SAPHENOUS VEIN (Left) Plan for transfer to step-down: see transfer orders Awaiting bed on progressive unit Remains in SR on amiodarone and Coreg Had 4 beats of non sustained VT and PVCs overnight- increase coreg to 12.5 mg BID Still volume overloaded, continue lasix Creatinine up slightly, monitor CBG well controlled- dc CBG/ SSi SCD + enoxaparin Continue ambulation   LOS: 11 days    [name] 01/27/2021

## 2021-01-28 ENCOUNTER — Inpatient Hospital Stay (HOSPITAL_COMMUNITY): Payer: Medicare HMO

## 2021-01-28 LAB — CBC
HCT: 27 % — ABNORMAL LOW (ref 39.0–52.0)
Hemoglobin: 8.6 g/dL — ABNORMAL LOW (ref 13.0–17.0)
MCH: 28.9 pg (ref 26.0–34.0)
MCHC: 31.9 g/dL (ref 30.0–36.0)
MCV: 90.6 fL (ref 80.0–100.0)
Platelets: 590 10*3/uL — ABNORMAL HIGH (ref 150–400)
RBC: 2.98 MIL/uL — ABNORMAL LOW (ref 4.22–5.81)
RDW: 14.6 % (ref 11.5–15.5)
WBC: 12.7 10*3/uL — ABNORMAL HIGH (ref 4.0–10.5)
nRBC: 0 % (ref 0.0–0.2)

## 2021-01-28 LAB — BASIC METABOLIC PANEL
Anion gap: 9 (ref 5–15)
BUN: 38 mg/dL — ABNORMAL HIGH (ref 8–23)
CO2: 25 mmol/L (ref 22–32)
Calcium: 8.4 mg/dL — ABNORMAL LOW (ref 8.9–10.3)
Chloride: 103 mmol/L (ref 98–111)
Creatinine, Ser: 1.47 mg/dL — ABNORMAL HIGH (ref 0.61–1.24)
GFR, Estimated: 50 mL/min — ABNORMAL LOW (ref >60)
Glucose, Bld: 108 mg/dL — ABNORMAL HIGH (ref 70–99)
Potassium: 4.7 mmol/L (ref 3.5–5.1)
Sodium: 137 mmol/L (ref 135–145)

## 2021-01-28 MED ORDER — CARVEDILOL 6.25 MG PO TABS
6.2500 mg | ORAL_TABLET | Freq: Two times a day (BID) | ORAL | Status: DC
  Administered 2021-01-28 – 2021-01-30 (×4): 6.25 mg via ORAL
  Filled 2021-01-28 (×4): qty 1

## 2021-01-28 MED ORDER — AMIODARONE HCL 200 MG PO TABS
200.0000 mg | ORAL_TABLET | Freq: Two times a day (BID) | ORAL | Status: DC
  Administered 2021-01-28 – 2021-01-30 (×5): 200 mg via ORAL
  Filled 2021-01-28 (×5): qty 1

## 2021-01-28 NOTE — Care Management (Addendum)
Requested [employer] to contact family re delivery times of DME at request of nurse.  Per [employer] they are waiting on daughter to return call and provide credit card information to process order. They state they have left 2 voicemails today. Updated RN by secure chat.  Attempt to reach daughter at number listed, wen to VM, LVM requesting callback  Marca Ancona Daughter [phone]  15:30 Spoke w [name] and provided her with Leavy Cella, [employer] equipment coordinator's phone number.

## 2021-01-28 NOTE — Progress Notes (Signed)
CARDIAC REHAB PHASE I   PRE:  Rate/Rhythm: 71 SR  BP:  Sitting: 119/75      SaO2: 97 RA  MODE:  Ambulation: 370 ft   POST:  Rate/Rhythm: 80 SR  BP:  Sitting: 92/32    SaO2: 100 RA   Pt helped to BR than ambulated 338ft in hallway standby assist with rollator. Pt took one short standing rest break c/o SOB. Coached through purse-lipped breathing. Pt returned to EOB, set-up for lunch. Encouraged continued ambulation and IS use. Rollator ordered for home use.  [id number] Reynold Bowen, RN BSN 01/28/2021 11:37 AM

## 2021-01-28 NOTE — Plan of Care (Signed)

## 2021-01-28 NOTE — Plan of Care (Signed)
Pt is progressing towards goals

## 2021-01-28 NOTE — Progress Notes (Signed)
      [address]       [place] [address]             [phone]      No complaints  Did well with ambulation today  Finally got a progressive bed this afternoon  Viviann Spare C. Dorris Fetch, MD [facility] [phone]

## 2021-01-28 NOTE — Progress Notes (Signed)
8 Days Post-Op Procedure(s) (LRB): CORONARY ARTERY BYPASS GRAFTING (CABG) X 3 ON PUMP, USING LEFT INTERNAL MAMMARY ARTERY AND LEFT ENDOSCOPIC GREATER SAPHENOUS VEIN HARVEST CONDUITS (N/A) TRANSESOPHAGEAL ECHOCARDIOGRAM (TEE) (N/A) APPLICATION OF CELL SAVER ENDOVEIN HARVEST OF GREATER SAPHENOUS VEIN (Left) Subjective: No complaints this AM  Objective: Vital signs in last 24 hours: Temp:  [97.9 F (36.6 C)-98.5 F (36.9 C)] 97.9 F (36.6 C) (09/17 0808) Pulse Rate:  [63-75] 75 (09/17 0741) Cardiac Rhythm: Normal sinus rhythm (09/17 0400) Resp:  [16-25] 18 (09/17 0500) BP: (94-126)/(48-81) 125/69 (09/17 0741) SpO2:  [95 %-100 %] 96 % (09/17 0500) Weight:  [384 kg] 119 kg (09/17 0500)  Hemodynamic parameters for last 24 hours:    Intake/Output from previous day: 09/16 0701 - 09/17 0700 In: 100 [P.O.:100] Out: 1575 [Urine:1575] Intake/Output this shift: No intake/output data recorded.  General appearance: alert, cooperative, and no distress Neurologic: intact Heart: regular rate and rhythm Lungs: clear to auscultation bilaterally Extremities: edema unchanged Wound: clean and dry  Lab Results: Recent Labs    01/27/21 0051 01/28/21 0524  WBC 10.1 12.7*  HGB 8.8* 8.6*  HCT 28.3* 27.0*  PLT 492* 590*   BMET:  Recent Labs    01/27/21 0051 01/28/21 0524  NA 136 137  K 4.3 4.7  CL 100 103  CO2 24 25  GLUCOSE 104* 108*  BUN 37* 38*  CREATININE 1.33* 1.47*  CALCIUM 8.6* 8.4*    PT/INR: No results for input(s): LABPROT, INR in the last 72 hours. ABG    Component Value Date/Time   PHART 7.357 01/20/2021 2002   HCO3 20.9 01/20/2021 2002   TCO2 22 01/20/2021 2002   ACIDBASEDEF 4.0 (H) 01/20/2021 2002   O2SAT 68.7 01/26/2021 0326   CBG (last 3)  Recent Labs    01/26/21 1605 01/26/21 2128 01/27/21 0642  GLUCAP 98 124* 113*    Assessment/Plan: S/P Procedure(s) (LRB): CORONARY ARTERY BYPASS GRAFTING (CABG) X 3 ON PUMP, USING LEFT INTERNAL MAMMARY ARTERY  AND LEFT ENDOSCOPIC GREATER SAPHENOUS VEIN HARVEST CONDUITS (N/A) TRANSESOPHAGEAL ECHOCARDIOGRAM (TEE) (N/A) APPLICATION OF CELL SAVER ENDOVEIN HARVEST OF GREATER SAPHENOUS VEIN (Left) - Looks great CV- in SR, will decrease amiodarone to 200 BID, coreg to 6.25 RESP- good sat on RA RENAL- creatinine up slightly, recheck in AM ENDO- CBG well controlled Gi - tolerating PO and + BM Ambulating well Possibly home tomorrow if renal function stable   LOS: 12 days    [name] 01/28/2021

## 2021-01-29 LAB — BASIC METABOLIC PANEL WITH GFR
Anion gap: 10 (ref 5–15)
BUN: 37 mg/dL — ABNORMAL HIGH (ref 8–23)
CO2: 24 mmol/L (ref 22–32)
Calcium: 8.2 mg/dL — ABNORMAL LOW (ref 8.9–10.3)
Chloride: 103 mmol/L (ref 98–111)
Creatinine, Ser: 1.49 mg/dL — ABNORMAL HIGH (ref 0.61–1.24)
GFR, Estimated: 49 mL/min — ABNORMAL LOW
Glucose, Bld: 114 mg/dL — ABNORMAL HIGH (ref 70–99)
Potassium: 4.1 mmol/L (ref 3.5–5.1)
Sodium: 137 mmol/L (ref 135–145)

## 2021-01-29 MED ORDER — ASPIRIN EC 81 MG PO TBEC: 81.0000 mg | DELAYED_RELEASE_TABLET | Freq: Every day | ORAL | 2 refills | Status: DC

## 2021-01-29 MED ORDER — AMIODARONE HCL 200 MG PO TABS: 200.0000 mg | ORAL_TABLET | Freq: Two times a day (BID) | ORAL | 1 refills | Status: DC

## 2021-01-29 MED ORDER — MELATONIN 3 MG PO TABS: 3.0000 mg | ORAL_TABLET | Freq: Every day | ORAL | 0 refills | Status: DC

## 2021-01-29 MED ORDER — CLOPIDOGREL BISULFATE 75 MG PO TABS: 75.0000 mg | ORAL_TABLET | Freq: Every day | ORAL | 11 refills | Status: DC

## 2021-01-29 MED ORDER — FUROSEMIDE 40 MG PO TABS
40.0000 mg | ORAL_TABLET | Freq: Every day | ORAL | 0 refills | Status: DC
Start: 2021-01-29 — End: 2021-02-07

## 2021-01-29 MED ORDER — CARVEDILOL 6.25 MG PO TABS
6.2500 mg | ORAL_TABLET | Freq: Two times a day (BID) | ORAL | 2 refills | Status: DC
Start: 2021-01-29 — End: 2021-06-02

## 2021-01-29 MED ORDER — ATORVASTATIN CALCIUM 80 MG PO TABS
80.0000 mg | ORAL_TABLET | Freq: Every day | ORAL | 2 refills | Status: DC
Start: 2021-01-29 — End: 2021-05-03

## 2021-01-29 MED ORDER — TRAMADOL HCL 50 MG PO TABS: 50.0000 mg | ORAL_TABLET | Freq: Four times a day (QID) | ORAL | 0 refills | Status: AC | PRN

## 2021-01-29 NOTE — Progress Notes (Signed)
Pt was ready to go home, however his Hospital bed DME was not delivered to home this evening.  He was not able to be discharged safely home.

## 2021-01-29 NOTE — Progress Notes (Addendum)
      [address]       [place] [address]             [phone]       9 Days Post-Op CABG x 3 Subjective:  Awake and alert, says he feels well and is anxious to return home with his daughter.  Objective: Vital signs in last 24 hours: Temp:  [97.6 F (36.4 C)-98.4 F (36.9 C)] 98.4 F (36.9 C) (09/18 0824) Pulse Rate:  [66-74] 74 (09/18 0824) Cardiac Rhythm: Normal sinus rhythm (09/18 0700) Resp:  [16-24] 19 (09/18 0824) BP: (85-119)/(56-75) 103/70 (09/18 0824) SpO2:  [95 %-98 %] 95 % (09/18 0824) Weight:  [993 kg] 118 kg (09/18 0542)    Intake/Output from previous day: 09/17 0701 - 09/18 0700 In: 423 [P.O.:420; I.V.:3] Out: 1550 [Urine:1550] Intake/Output this shift: No intake/output data recorded.  General appearance: alert and oriented, no distress Neurologic: intact Heart: regular rate and rhythm, no arrhythmias Lungs: clear to auscultation bilaterally Extremities: edema unchanged Wound: clean and dry  Lab Results: Recent Labs    01/27/21 0051 01/28/21 0524  WBC 10.1 12.7*  HGB 8.8* 8.6*  HCT 28.3* 27.0*  PLT 492* 590*    BMET:  Recent Labs    01/28/21 0524 01/29/21 0032  NA 137 137  K 4.7 4.1  CL 103 103  CO2 25 24  GLUCOSE 108* 114*  BUN 38* 37*  CREATININE 1.47* 1.49*  CALCIUM 8.4* 8.2*     PT/INR: No results for input(s): LABPROT, INR in the last 72 hours. ABG    Component Value Date/Time   PHART 7.357 01/20/2021 2002   HCO3 20.9 01/20/2021 2002   TCO2 22 01/20/2021 2002   ACIDBASEDEF 4.0 (H) 01/20/2021 2002   O2SAT 68.7 01/26/2021 0326   CBG (last 3)  Recent Labs    01/26/21 1605 01/26/21 2128 01/27/21 0642  GLUCAP 98 124* 113*     Assessment/Plan: S/P Procedure(s) (LRB): CORONARY ARTERY BYPASS GRAFTING (CABG) X 3 ON PUMP, USING LEFT INTERNAL MAMMARY ARTERY AND LEFT ENDOSCOPIC GREATER SAPHENOUS VEIN HARVEST CONDUITS (N/A) TRANSESOPHAGEAL ECHOCARDIOGRAM (TEE) (N/A) APPLICATION OF CELL  SAVER ENDOVEIN HARVEST OF GREATER SAPHENOUS VEIN (Left) - -POD9 CABG x 3 for acute STEMI. VS and cardiac rhythm stable. Doing well with mobility. Wt is down to pre-op level. Add Plavix and decrease the ASA to 81mg  daily at discharge for STEMI.  -History of HTN- BP well controlled on carvedilol 6.25mg  BID  -Post-op atrial fibrillation- maintaining SR on oral amiodarone and carvedilol.   -Post-op renal insufficiency- Continues with mild elevation but is stable.  Urine output is adequate, K+ 4.1  -Small left pleural effusion- mild, maintaining good oxygenation on RA. Will plan to discharge on oral Lasix daily and follow up with CXR in the office.   -Expected acute blood loss anemia- Sl down-trend in Hct.     LOS: 13 days    , PA-C  01/29/2021  Patient seen and examined, agree with above Renal function stable Dc Home today  01/31/2021. Salvatore Decent, MD [facility] [phone]

## 2021-01-29 NOTE — Plan of Care (Signed)

## 2021-01-29 NOTE — TOC Transition Note (Signed)
Transition of Care [facility]) - CM/SW Discharge Note   Patient Details  Name: Timothy Munoz MRN: [id number] Date of Birth: [birth year]  [facility]) CM/SW Contact:  Norvel Richards, RN Phone Number: 01/29/2021, 11:19 AM   Clinical Narrative:   [facility] team for discharge planning. Received message from Nurse- Sarah  regarding patient's daughter- [name] is concerned about DME delivery. Call to [facility]- [phone]. She shares she has spoken to [facility] DME coordinator and the delivery person. She confirmed the address the DME is to be delivered to. They could not give her an exact time of delivery today. She shares that as soon as the equipment is delivered she will pick the patient up. The patient will recover in her home. Will continue to monitor for any further discharge needs.    Final next level of care: Home/Self Care Barriers to Discharge: No Barriers Identified   Patient Goals and CMS Choice Patient states their goals for this hospitalization and ongoing recovery are:: to return to his daughters home      Discharge Placement                       Discharge Plan and Services In-house Referral: NA Discharge Planning Services: CM Consult            DME Arranged: Hospital bed DME Agency:  (DME previously arranged through [facility].)                  Social Determinants of Health (SDOH) Interventions     Readmission Risk Interventions No flowsheet data found.

## 2021-01-30 DIAGNOSIS — Z951 Presence of aortocoronary bypass graft: Secondary | ICD-10-CM

## 2021-01-30 NOTE — Care Management Important Message (Signed)
Important Message  Patient Details  Name: Timothy Munoz MRN: [id number] Date of Birth: 19-Oct-1945   Medicare Important Message Given:  Yes     Mardene Sayer 01/30/2021, 12:27 PM

## 2021-01-30 NOTE — Progress Notes (Signed)
CARDIAC REHAB PHASE I   PRE:  Rate/Rhythm: 76 SR    BP: sitting 114/62    SaO2: 95 RA  MODE:  Ambulation: 400 ft   POST:  Rate/Rhythm: 90 SR    BP: sitting 126/82     SaO2: 99 RA   Pt moved out of bed and stood following sternal precautions. Used unit rollator with steady gait, although rollator too short for him (rollator ordered for home). Tolerated well but fatigued with distance. To recliner, which he stated felt good as he had stayed in bed yesterday. Reviewed ed for pt, eager to d/c. Reinforced that pt can sleep in recliner or regular bed in need be.  [id number]  Harriet Masson CES, ACSM 01/30/2021 9:18 AM

## 2021-01-30 NOTE — Progress Notes (Addendum)
      [address]       [place] [address]             [phone]      10 Days Post-Op Procedure(s) (LRB): CORONARY ARTERY BYPASS GRAFTING (CABG) X 3 ON PUMP, USING LEFT INTERNAL MAMMARY ARTERY AND LEFT ENDOSCOPIC GREATER SAPHENOUS VEIN HARVEST CONDUITS (N/A) TRANSESOPHAGEAL ECHOCARDIOGRAM (TEE) (N/A) APPLICATION OF CELL SAVER ENDOVEIN HARVEST OF GREATER SAPHENOUS VEIN (Left)  Subjective:  Patient has no new complaints.  The patient is frustrated he didn't get to go home yesterday, states it was a big let down.  Objective: Vital signs in last 24 hours: Temp:  [98.3 F (36.8 C)-98.4 F (36.9 C)] 98.4 F (36.9 C) (09/19 0258) Pulse Rate:  [66-75] 70 (09/19 0400) Resp:  [17-21] 19 (09/19 0400) BP: (100-108)/(59-75) 102/65 (09/19 0400) SpO2:  [90 %-96 %] 94 % (09/19 0400) Weight:  [117.3 kg] 117.3 kg (09/19 0258)  Intake/Output from previous day: 09/18 0701 - 09/19 0700 In: 240 [P.O.:240] Out: 275 [Urine:275]  General appearance: alert, cooperative, and no distress Heart: regular rate and rhythm Lungs: clear to auscultation bilaterally Abdomen: soft, non-tender; bowel sounds normal; no masses,  no organomegaly Extremities: edema 1+ Wound: clean and dry  Lab Results: Recent Labs    01/28/21 0524  WBC 12.7*  HGB 8.6*  HCT 27.0*  PLT 590*   BMET:  Recent Labs    01/28/21 0524 01/29/21 0032  NA 137 137  K 4.7 4.1  CL 103 103  CO2 25 24  GLUCOSE 108* 114*  BUN 38* 37*  CREATININE 1.47* 1.49*  CALCIUM 8.4* 8.2*    PT/INR: No results for input(s): LABPROT, INR in the last 72 hours. ABG    Component Value Date/Time   PHART 7.357 01/20/2021 2002   HCO3 20.9 01/20/2021 2002   TCO2 22 01/20/2021 2002   ACIDBASEDEF 4.0 (H) 01/20/2021 2002   O2SAT 68.7 01/26/2021 0326   CBG (last 3)  No results for input(s): GLUCAP in the last 72 hours.  Assessment/Plan: S/P Procedure(s) (LRB): CORONARY ARTERY BYPASS GRAFTING (CABG) X 3 ON PUMP, USING  LEFT INTERNAL MAMMARY ARTERY AND LEFT ENDOSCOPIC GREATER SAPHENOUS VEIN HARVEST CONDUITS (N/A) TRANSESOPHAGEAL ECHOCARDIOGRAM (TEE) (N/A) APPLICATION OF CELL SAVER ENDOVEIN HARVEST OF GREATER SAPHENOUS VEIN (Left)  CV- Post op A. Fib, maintaining NSR, BP is controlled on Coreg, Amiodarone Pulm- no acute issues, off oxygen, continue IS Renal- creatinine has been stable, weight remains elevated, + edema on exam- continue Lasix, potassium Dispo- patient stable for discharge today, he was unable to leave yesterday due to hospital bed not being delivered, hopefully this wont be a problem and will be delivered today.   LOS: 14 days   Lowella Dandy, PA-C 01/30/2021 Patient seen and examined, agree with above  Viviann Spare C. Dorris Fetch, MD [facility] [phone]

## 2021-01-30 NOTE — TOC Transition Note (Signed)
Transition of Care [facility]) - CM/SW Discharge Note   Patient Details  Name: Timothy Munoz MRN: [id number] Date of Birth: 1946-02-15  Transition [facility] Care [facility]) CM/SW Contact:  Leone Haven, RN Phone Number: 01/30/2021, 2:12 PM   Clinical Narrative:    Patient is for dc today, the DME is supposed to be delivered to daughters home by 2 pm and then she will come to hospital to transport patient home.  NCM informed Hermine Messick of this information.     Final next level of care: Home/Self Care Barriers to Discharge: No Barriers Identified   Patient Goals and CMS Choice Patient states their goals for this hospitalization and ongoing recovery are:: to return to his daughters home   Choice offered to / list presented to : NA  Discharge Placement                       Discharge Plan and Services In-house Referral: NA Discharge Planning Services: CM Consult            DME Arranged: Hospital bed, Walker rolling with seat DME Agency: [employer] Date DME Agency Contacted: 01/30/21 Time DME Agency Contacted: [phone] Representative spoke with at DME Agency: Silvio Pate HH Arranged: NA          Social Determinants of Health (SDOH) Interventions     Readmission Risk Interventions No flowsheet data found.

## 2021-01-30 NOTE — TOC Initial Note (Signed)
Transition of Care [facility]) - Initial/Assessment Note    Patient Details  Name: Timothy Munoz MRN: [id number] Date of Birth: [birth year]  Transition of Care [facility]) CM/SW Contact:    Leone Haven, RN Phone Number: 01/30/2021, 9:07 AM  Clinical Narrative:                 NCM spoke with daughter [name], she states she has called and left messages for [place] at [employer] , she has not heard back yet about the hospital bed.  It should be delivered to [address] [place].  NCM left message with [place] at [employer] and spoke to [place] with [employer] to look into this.  The hospital bed has to be there before patient gets there per daughter.  Expected Discharge Plan: Home w Home Health Services Barriers to Discharge: No Barriers Identified   Patient Goals and CMS Choice Patient states their goals for this hospitalization and ongoing recovery are:: to return to his daughters home      Expected Discharge Plan and Services Expected Discharge Plan: Home w Home Health Services In-house Referral: NA Discharge Planning Services: CM Consult   Living arrangements for the past 2 months: Single Family Home Expected Discharge Date: 01/30/21               DME Arranged: Hospital bed DME Agency:  (DME previously arranged through [facility].)                  Prior Living Arrangements/Services Living arrangements for the past 2 months: Single Family Home Lives with:: Self (Patient will return to his daughters home once stable.) Patient language and need for interpreter reviewed:: Yes        Need for Family Participation in Patient Care: Yes (Comment) Care giver support system in place?: Yes (comment)   Criminal Activity/Legal Involvement Pertinent to Current Situation/Hospitalization: No - Comment as needed  Activities of Daily Living Home Assistive Devices/Equipment: None ADL Screening (condition at time of admission) Patient's cognitive ability adequate to safely complete daily  activities?: Yes Is the patient deaf or have difficulty hearing?: No Does the patient have difficulty seeing, even when wearing glasses/contacts?: No Does the patient have difficulty concentrating, remembering, or making decisions?: No Patient able to express need for assistance with ADLs?: Yes Does the patient have difficulty dressing or bathing?: No Independently performs ADLs?: Yes (appropriate for developmental age) Does the patient have difficulty walking or climbing stairs?: No Weakness of Legs: None Weakness of Arms/Hands: None  Permission Sought/Granted Permission sought to share information with : Family Supports, Case Manager                Emotional Assessment Appearance:: Appears stated age       Alcohol / Substance Use: Not Applicable Psych Involvement: No (comment)  Admission diagnosis:  STEMI (ST elevation myocardial infarction) (HCC) [I21.3] STEMI involving left anterior descending coronary artery (HCC) [I21.02] Coronary artery disease [I25.10] Patient Active Problem List   Diagnosis Date Noted   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 [place] [address] Phone: [phone] Fax: [phone]     Social Determinants of Health (SDOH) Interventions    Readmission Risk Interventions No flowsheet data found.

## 2021-01-31 NOTE — Care Management Important Message (Signed)
Important Message  Patient Details  Name: Timothy Munoz MRN: [id number] Date of Birth: June 23, 1945   Medicare Important Message Given:  Yes  Patient left prior to IM delivery will mail to the patient home address.    Jeremias Broyhill 01/31/2021, 8:22 AM

## 2021-02-01 ENCOUNTER — Telehealth (HOSPITAL_COMMUNITY): Payer: Self-pay

## 2021-02-01 NOTE — Telephone Encounter (Signed)
Pt insurance is active and benefits verified through [employer] $10, DED 0/0 met, out of pocket $3,900/$471.66 met, co-insurance 0%. no pre-authorization required. Passport, 02/01/2021@11 :52am, REF# [id number]   Will contact patient to see if he is interested in the Cardiac Rehab Program. If interested, patient will need to complete follow up appt. Once completed, patient will be contacted for scheduling upon review by the RN Navigator.

## 2021-02-01 NOTE — Telephone Encounter (Signed)
Called pt to see if he is interested in the cardiac rehab program once he has been cleared, pt advised me to call his daughter Timothy Munoz and speak with her about the cardiac rehab because he is a bit confused about everything that is going on. I called pt daughter Timothy Munoz and she stated that she would talk with her dad and see if this is something he would like to do I explained to her how cardiac rehab works and pt insurance benefits and will call back once pt has been cleared from his f/u.

## 2021-02-02 DIAGNOSIS — R7309 Other abnormal glucose: Secondary | ICD-10-CM | POA: Diagnosis not present

## 2021-02-02 DIAGNOSIS — I10 Essential (primary) hypertension: Secondary | ICD-10-CM | POA: Diagnosis not present

## 2021-02-02 DIAGNOSIS — I213 ST elevation (STEMI) myocardial infarction of unspecified site: Secondary | ICD-10-CM | POA: Diagnosis not present

## 2021-02-06 NOTE — Progress Notes (Signed)
Cardiology Clinic Note   Patient Name: Timothy Munoz Date of Encounter: 02/07/2021  Primary Care Provider:  [name], MD Primary Cardiologist:  [name], MD  Patient Profile    Timothy Munoz 75 year old male presents to the clinic today for follow-up evaluation of his coronary artery disease status post CABG x3 on 01/20/2021.  Past Medical History    Past Medical History:  Diagnosis Date   Hyperlipidemia    mild    Hypertension    Knee pain    Obesity    Tremor    Tremor, essential 03/04/2019   Past Surgical History:  Procedure Laterality Date   APPENDECTOMY     BACK SURGERY     CORONARY ARTERY BYPASS GRAFT N/A 01/20/2021   Procedure: CORONARY ARTERY BYPASS GRAFTING (CABG) X 3 ON PUMP, USING LEFT INTERNAL MAMMARY ARTERY AND LEFT ENDOSCOPIC GREATER SAPHENOUS VEIN HARVEST CONDUITS;  Surgeon: [name], MD;  Location: [facility] OR;  Service: Open Heart Surgery;  Laterality: N/A;   CORONARY/GRAFT ACUTE MI REVASCULARIZATION N/A 01/16/2021   Procedure: Coronary/Graft Acute MI Revascularization;  Surgeon: Lennette Bihari, MD;  Location: [facility] INVASIVE CV LAB;  Service: Cardiovascular;  Laterality: N/A;   ENDOVEIN HARVEST OF GREATER SAPHENOUS VEIN Left 01/20/2021   Procedure: ENDOVEIN HARVEST OF GREATER SAPHENOUS VEIN;  Surgeon: [name], MD;  Location: [facility] OR;  Service: Open Heart Surgery;  Laterality: Left;   HEMORRHOID SURGERY     IABP INSERTION N/A 01/16/2021   Procedure: IABP Insertion;  Surgeon: Lennette Bihari, MD;  Location: [facility] INVASIVE CV LAB;  Service: Cardiovascular;  Laterality: N/A;   LEFT HEART CATH AND CORONARY ANGIOGRAPHY N/A 01/16/2021   Procedure: LEFT HEART CATH AND CORONARY ANGIOGRAPHY;  Surgeon: Lennette Bihari, MD;  Location: [facility] INVASIVE CV LAB;  Service: Cardiovascular;  Laterality: N/A;   TEE WITHOUT CARDIOVERSION N/A 01/20/2021   Procedure: TRANSESOPHAGEAL ECHOCARDIOGRAM (TEE);  Surgeon: [name], MD;  Location: [facility] OR;  Service:  Open Heart Surgery;  Laterality: N/A;    Allergies  No Known Allergies  History of Present Illness    [name] has a PMH of anterior lateral STEMI with balloon angioplasty of the ostial LAD, cardiogenic shock, LVEF 35% which required intra-aortic balloon pump for support, and CABG x3 on 01/20/2021.  He was noted to have postoperative atrial fibrillation and was placed on amiodarone and digoxin.  His echocardiogram 01/25/2021 showed an LVEF of 40-45%, G2 DD, normal right ventricular function, and no significant valvular abnormalities.  He presents the clinic today for follow-up evaluation states he notices some increased work of breathing when he lays back.  He continues to have +2 pitting bilateral lower extremity edema as well as forearm and hand edema.  He has been walking daily.  We reviewed the importance of heart healthy low-sodium diet and limiting fluid.  We reviewed his echocardiogram and start him on losartan 25 mg daily.  We will repeat a BMP in 1 week.  I will increase his furosemide to 60 mg x 3 days then return to 40 mg daily.  We will plan follow-up in 1 month.  Today he denies chest pain, shortness of breath, lower extremity edema, fatigue, palpitations, melena, hematuria, hemoptysis, diaphoresis, weakness, presyncope, syncope, orthopnea, and PND.   Home Medications    Prior to Admission medications   Medication Sig Start Date End Date Taking? Authorizing Provider  amiodarone (PACERONE) 200 MG tablet Take 1 tablet (200 mg total) by mouth 2 (two)  times daily. 01/29/21   Leary Roca, PA-C  aspirin EC 81 MG tablet Take 1 tablet (81 mg total) by mouth daily. Swallow whole. 01/29/21 05/12/22  Leary Roca, PA-C  atorvastatin (LIPITOR) 80 MG tablet Take 1 tablet (80 mg total) by mouth daily. 01/29/21   Leary Roca, PA-C  carvedilol (COREG) 6.25 MG tablet Take 1 tablet (6.25 mg total) by mouth 2 (two) times daily with a meal. 01/29/21   Roddenberry, Cecille Amsterdam,  PA-C  clopidogrel (PLAVIX) 75 MG tablet Take 1 tablet (75 mg total) by mouth daily. 01/29/21 01/29/22  Leary Roca, PA-C  furosemide (LASIX) 40 MG tablet Take 1 tablet (40 mg total) by mouth daily. 01/29/21   Roddenberry, Cecille Amsterdam, PA-C  melatonin 3 MG TABS tablet Take 1 tablet (3 mg total) by mouth at bedtime. 01/29/21   Leary Roca, PA-C  primidone (MYSOLINE) 50 MG tablet Take 1/2 tablet at bedtime for 1 week, then increase to 1 tablet at bedtime Patient taking differently: Take 50 mg by mouth daily. 08/29/20   Glean Salvo, NP    Family History    Family History  Problem Relation Age of Onset   Hypertension Mother    Parkinson's disease Mother    Alzheimer's disease Mother    Hypertension Father    Suicidality Father    He indicated that his mother is deceased. He indicated that his father is deceased. He indicated that his sister is alive. He indicated that his child is alive.  Social History    Social History   Socioeconomic History   Marital status: Single    Spouse name: Not on file   Number of children: Not on file   Years of education: Not on file   Highest education level: Not on file  Occupational History   Occupation: retired    Comment: Pharmacist, hospital  Tobacco Use   Smoking status: Former   Smokeless tobacco: Current    Types: Chew  Substance and Sexual Activity   Alcohol use: Yes    Comment: one beer every two months   Drug use: No   Sexual activity: Not on file  Other Topics Concern   Not on file  Social History Narrative   Right handed    Social Determinants of Health   Financial Resource Strain: Not on file  Food Insecurity: Not on file  Transportation Needs: Not on file  Physical Activity: Not on file  Stress: Not on file  Social Connections: Not on file  Intimate Partner Violence: Not on file     Review of Systems    General:  No chills, fever, night sweats or weight changes.  Cardiovascular:  No chest pain, dyspnea on  exertion, edema, orthopnea, palpitations, paroxysmal nocturnal dyspnea. Dermatological: No rash, lesions/masses Respiratory: No cough, dyspnea Urologic: No hematuria, dysuria Abdominal:   No nausea, vomiting, diarrhea, bright red blood per rectum, melena, or hematemesis Neurologic:  No visual changes, wkns, changes in mental status. All other systems reviewed and are otherwise negative except as noted above.  Physical Exam    VS:  BP 124/72 (BP Location: Right Arm, Patient Position: Sitting, Cuff Size: Normal)   Pulse 73   Ht 6\' 3"  (1.905 m)   Wt 254 lb 3.2 oz (115.3 kg)   SpO2 99%   BMI 31.77 kg/m  , BMI Body mass index is 31.77 kg/m. GEN: Well nourished, well developed, in no acute distress. HEENT: normal. Neck: Supple, no JVD, carotid bruits,  or masses. Cardiac: RRR, no murmurs, rubs, or gallops. No clubbing, cyanosis, edema.  Radials/DP/PT 2+ and equal bilaterally.  Respiratory:  Respirations regular and unlabored, clear to auscultation bilaterally. GI: Soft, nontender, nondistended, BS + x 4. MS: no deformity or atrophy. Skin: warm and dry, no rash. Neuro:  Strength and sensation are intact. Psych: Normal affect.  Accessory Clinical Findings    Recent Labs: 01/16/2021: ALT 86; B Natriuretic Peptide 44.2; TSH 1.959 01/21/2021: Magnesium 2.5 01/28/2021: Hemoglobin 8.6; Platelets 590 01/29/2021: BUN 37; Creatinine, Ser 1.49; Potassium 4.1; Sodium 137   Recent Lipid Panel    Component Value Date/Time   CHOL 163 01/16/2021 0739   TRIG 33 01/16/2021 0739   HDL 42 01/16/2021 0739   CHOLHDL 3.9 01/16/2021 0739   VLDL 7 01/16/2021 0739   LDLCALC 114 (H) 01/16/2021 0739    ECG personally reviewed by me today-normal sinus rhythm anterior septal infarct undetermined age 55 bpm- No acute changes  Echocardiogram 01/16/2021 IMPRESSIONS     1. Left ventricular ejection fraction, by estimation, is 35%. The left  ventricle has moderately decreased function. The left ventricle   demonstrates regional wall motion abnormalities with basal to mid  anteroseptal, inferoseptal, and anterior akinesis,  apical anterior akinesis, apical inferior akinesis. There is mild left  ventricular hypertrophy. Left ventricular diastolic parameters are  consistent with Grade I diastolic dysfunction (impaired relaxation).   2. Hypokinesis of the apical RV. Right ventricular systolic function is  mildly reduced. The right ventricular size is normal. Tricuspid  regurgitation signal is inadequate for assessing PA pressure.   3. The mitral valve is normal in structure. Trivial mitral valve  regurgitation. No evidence of mitral stenosis.   4. The aortic valve is tricuspid. Aortic valve regurgitation is not  visualized. No aortic stenosis is present.   5. Aortic dilatation noted. There is mild dilatation of the ascending  aorta, measuring 42 mm.   6. The inferior vena cava is normal in size with greater than 50%  respiratory variability, suggesting right atrial pressure of 3 mmHg.   Echocardiogram 01/25/2021 IMPRESSIONS     1. Global hypokinesis. Anteroseptal, mid-apical inferoseptal, apicl  inferior, and apical akinesis. Left ventricular ejection fraction, by  estimation, is 40 to 45%. The left ventricle has mildly decreased  function. The left ventricle demonstrates  regional wall motion abnormalities (see scoring diagram/findings for  description). There is moderate asymmetric left ventricular hypertrophy of  the basal-septal segment. Left ventricular diastolic parameters are  consistent with Grade II diastolic  dysfunction (pseudonormalization). The average left ventricular global  longitudinal strain is -8.8 %. The global longitudinal strain is abnormal.   2. Right ventricular systolic function is normal. The right ventricular  size is normal. There is normal pulmonary artery systolic pressure.   3. Small pericarcdial effusion measuring 0.74 cm. a small pericardial  effusion is  present. The pericardial effusion is posterior to the left  ventricle.   4. The mitral valve is normal in structure. No evidence of mitral valve  regurgitation. No evidence of mitral stenosis.   5. The aortic valve is tricuspid. Aortic valve regurgitation is not  visualized. No aortic stenosis is present.   6. Aortic dilatation noted. There is mild dilatation of the ascending  aorta, measuring 41 mm. There is mild dilatation of the aortic root,  measuring 43 mm.   7. The inferior vena cava is normal in size with <50% respiratory  variability, suggesting right atrial pressure of 8 mmHg.  Cardiac catheterization 01/16/2021  Mid LM to Dist LM lesion is 20% stenosed.   Ost LAD to Prox LAD lesion is 100% stenosed.   Ost Cx lesion is 90% stenosed.   Ost RCA lesion is 20% stenosed.   Post intervention, there is a 60% residual stenosis.   There is moderate left ventricular systolic dysfunction.   LV end diastolic pressure is moderately elevated.   The left ventricular ejection fraction is 35-45% by visual estimate.   Acute anterolateral myocardial infarction secondary to total near ostial occlusion of a large LAD which wraps around the LV apex.   Large normal ramus intermediate vessel.   Left circumflex vessel with 85 to 90% ostial stenosis.   Large RCA with 20% irregularity proximally.   PTCA of the LAD ostium with hypotension and idioventricular rhythm with reperfusion requiring initiation of Levophed reestablishment of TIMI-3 flow to a large LAD vessel which wraps around the LV apex.   Insertion of an intra-aortic balloon pump for optimal hemodynamic support with augmented diastolic pressure at 150 millimeters mercury.   RECOMMENDATION: In light of the patient's complex anatomy, stenting was not performed due to high likelihood of jailing a very large ramus intermediate vessel and with concomitant ostial circumflex stenosis it was felt that surgical revascularization once patient  stabilizes from his myocardial infarction would be optimal therapy.  He did receive 1 dose of oral Brilinta in the laboratory which will not be continued.  We will plan 2D echo Doppler today.  Initiate aggressive lipid-lowering therapy.  Dr. Cliffton Asters was contacted who will see the patient and depending upon the patient;s hemodynamic stability perform CABG revascularization surgery this week following washout of his initial Brilinta dose.  Diagnostic Dominance: Right Intervention    Assessment & Plan   1.  CABG x3-denies further episodes of chest discomfort since discharge.  Does note some surgical site soreness.  Was admitted to the hospital on 01/16/2021 with chest discomfort/STEMI and was taken to cardiac Cath Lab emergently.  He required intra-aortic balloon pump support for an EF of 35%.  He received angioplasty to his ostial LAD which was suboptimal.  Cardiovascular surgery was consulted and he underwent CABG x3 on 01/20/2021.  Noted to have postoperative atrial fibrillation. Continue aspirin, atorvastatin, carvedilol, Plavix, amiodarone Heart healthy low-sodium diet-salty 6 given Increase physical activity as tolerated-maintain sternal precautions  Ischemic cardiomyopathy-postoperative echocardiogram showed slight improvement in LVEF to 40-45%. Continue carvedilol, Increase furosemide to 60 mg daily x3 days then return to 40 mg daily Start losartan 25 mg daily Heart healthy low-sodium diet-salty 6 given Increase physical activity as tolerated Order BMP in 1 week. Will continue to titrate guideline directed medical therapy and repeat echocardiogram once medications been optimized.  Atrial fibrillation-noted to have postoperative atrial fibrillation.  EKG today shows normal sinus rhythm anterior septal infarct undetermined age 38 bpm. Continue amiodarone, aspirin, carvedilol, clopidogrel Avoid triggers caffeine, chocolate, EtOH, dehydration etc.  Hyperlipidemia-01/16/2021: Cholesterol 163;  HDL 42; LDL Cholesterol 114; Triglycerides 33; VLDL 7 Continue aspirin, atorvastatin Heart healthy low-sodium high-fiber diet Increase physical activity as tolerated  Disposition: Follow-up with Dr. [name] or me 1 month.  Thomasene Ripple. Oluwasemilore Pascuzzi NP-C    02/07/2021, 11:24 AM [facility] [address] Office [phone] Fax [phone]  Notice: This dictation was prepared with Dragon dictation along with smaller phrase technology. Any transcriptional errors that result from this process are unintentional and may not be corrected upon review.  I spent 14 minutes examining this patient, reviewing medications, and using  patient centered shared decision making involving her cardiac care.  Prior to her visit I spent greater than 20 minutes reviewing her past medical history,  medications, and prior cardiac tests.

## 2021-02-07 ENCOUNTER — Ambulatory Visit: Payer: Medicare HMO | Admitting: General Practice

## 2021-02-07 ENCOUNTER — Other Ambulatory Visit: Payer: Self-pay

## 2021-02-07 ENCOUNTER — Encounter: Payer: Self-pay | Admitting: General Practice

## 2021-02-07 VITALS — BP 124/72 | HR 73 | Ht 75.0 in | Wt 254.2 lb

## 2021-02-07 DIAGNOSIS — I255 Ischemic cardiomyopathy: Secondary | ICD-10-CM | POA: Diagnosis not present

## 2021-02-07 DIAGNOSIS — Z951 Presence of aortocoronary bypass graft: Secondary | ICD-10-CM | POA: Diagnosis not present

## 2021-02-07 DIAGNOSIS — I4891 Unspecified atrial fibrillation: Secondary | ICD-10-CM

## 2021-02-07 DIAGNOSIS — Z79899 Other long term (current) drug therapy: Secondary | ICD-10-CM | POA: Diagnosis not present

## 2021-02-07 DIAGNOSIS — E785 Hyperlipidemia, unspecified: Secondary | ICD-10-CM

## 2021-02-07 MED ORDER — FUROSEMIDE 40 MG PO TABS
40.0000 mg | ORAL_TABLET | Freq: Every day | ORAL | 3 refills | Status: DC
Start: 2021-02-07 — End: 2021-02-21

## 2021-02-07 MED ORDER — LOSARTAN POTASSIUM 25 MG PO TABS: 25.0000 mg | ORAL_TABLET | Freq: Every day | ORAL | 3 refills | Status: DC

## 2021-02-07 NOTE — Patient Instructions (Addendum)
Medication Instructions:  INCREASE FUROSEMIDE 60MG  x3 DAYS THEN BACK TO 40MG   DAILY  START LOSARTAN 25MG  DAILY  *If you need a refill on your cardiac medications before your next appointment, please call your pharmacy*  Lab Work: BMET IN 1 WEEK (02-14-21) If you have labs (blood work) drawn today and your tests are completely normal, you will receive your results only by:  MyChart Message (if you have MyChart) OR A paper copy in the mail.  If you have any lab test that is abnormal or we need to change your treatment, we will call you to review the results. You may go to any [facility] that is convenient for you however, we do have a lab in our office that is able to assist you. You DO NOT need an appointment for our lab. The lab is open 8:00am and closes at 4:00pm. Lunch 12:45 - 1:45pm.  Special Instructions TAKE AND LOG YOUR WEIGHT DAILY  PLEASE READ AND FOLLOW SALTY 6-ATTACHED-1,800 mg daily  FLUID RESTRICTION ONLY 48 oz DAILY  Follow-Up: Your next appointment:  1 month(s) In Person with You may see , MD OR IF UNAVAILABLE JESSE CLEAVER, FNP-C   At [facility], you and your health needs are our priority.  As part of our continuing mission to provide you with exceptional heart care, we have created designated Provider Care Teams.  These Care Teams include your primary Cardiologist (physician) and Advanced Practice Providers (APPs -  Physician Assistants and Nurse Practitioners) who all work together to provide you with the care you need, when you need it.  We recommend signing up for the patient portal called "MyChart".  Sign up information is provided on this After Visit Summary.  MyChart is used to connect with patients for Virtual Visits (Telemedicine).  Patients are able to view lab/test results, encounter notes, upcoming appointments, etc.  Non-urgent messages can be sent to your provider as well.   To learn more about what you can do with MyChart, go to  03-08-1998.

## 2021-02-20 ENCOUNTER — Other Ambulatory Visit: Payer: Self-pay | Admitting: *Deleted

## 2021-02-20 DIAGNOSIS — Z951 Presence of aortocoronary bypass graft: Secondary | ICD-10-CM

## 2021-02-21 ENCOUNTER — Other Ambulatory Visit: Payer: Self-pay

## 2021-02-21 ENCOUNTER — Other Ambulatory Visit: Payer: Self-pay | Admitting: Thoracic Surgery (Cardiothoracic Vascular Surgery)

## 2021-02-21 ENCOUNTER — Ambulatory Visit (INDEPENDENT_AMBULATORY_CARE_PROVIDER_SITE_OTHER): Payer: Self-pay | Admitting: Physician Assistant

## 2021-02-21 ENCOUNTER — Ambulatory Visit
Admission: RE | Admit: 2021-02-21 | Discharge: 2021-02-21 | Disposition: A | Discharge status: Home / Self Care | Payer: Medicare HMO | Source: Ambulatory Visit | Attending: Thoracic Surgery (Cardiothoracic Vascular Surgery) | Admitting: Thoracic Surgery (Cardiothoracic Vascular Surgery)

## 2021-02-21 ENCOUNTER — Telehealth: Payer: Self-pay | Admitting: Cardiovascular Disease

## 2021-02-21 ENCOUNTER — Encounter: Payer: Self-pay | Admitting: Physician Assistant

## 2021-02-21 ENCOUNTER — Ambulatory Visit: Payer: Self-pay | Admitting: Thoracic Surgery (Cardiothoracic Vascular Surgery)

## 2021-02-21 VITALS — BP 123/80 | HR 75 | Resp 20 | Ht 75.0 in | Wt 246.6 lb

## 2021-02-21 DIAGNOSIS — Z951 Presence of aortocoronary bypass graft: Secondary | ICD-10-CM

## 2021-02-21 DIAGNOSIS — R0602 Shortness of breath: Secondary | ICD-10-CM | POA: Diagnosis not present

## 2021-02-21 MED ORDER — FUROSEMIDE 40 MG PO TABS: 40.0000 mg | ORAL_TABLET | Freq: Every day | ORAL | 3 refills | Status: DC

## 2021-02-21 NOTE — Telephone Encounter (Signed)
Pt c/o medication issue:  1. Name of Medication: primidone (MYSOLINE) 50 MG tablet  2. How are you currently taking this medication (dosage and times per day)? Not currently taking   3. Are you having a reaction (difficulty breathing--STAT)? yes  4. What is your medication issue? Pt shaking is getting really bad, would like to restart this medication if possible

## 2021-02-21 NOTE — Telephone Encounter (Signed)
Spoke with pt wife, she reports when they last saw Timothy Munoz he had ask them to not take the primidone and she reports now his tremors are so bad he is having trouble feeding himself. Aware will forward to pharm md to make sure it would be okay for him to restart.

## 2021-02-21 NOTE — Progress Notes (Signed)
[address]       [place] [address]             [phone]       Timothy Munoz is a 75 y.o. male patient who underwent a CABG x 3 on 9/9 with Dr. Dorris Fetch. He developed atrial fibrillation on POD 2 and was treated with IV Amio with subsequent conversion. He initially required milrinone for a low coox. He was also started on aspirin and statin as well as low-dose carvedilol.  The patient was also started on Digoxin to assist with contractility due to acute systolic heart failure. The echo was repeated on post-op day 5 and showed LVEF of 40-45% with no hemodynamically  significant pericardial effusion. RV function was also normal.   Today he presents for his routine follow-up visit following his hospital admission. He is still having some shortness of breath and is unable to due his incentive spirometer as well as he did when he left the hospital.    No diagnosis found. Past Medical History:  Diagnosis Date   Hyperlipidemia    mild    Hypertension    Knee pain    Obesity    Tremor    Tremor, essential 03/04/2019   No past surgical history pertinent negatives on file. Scheduled Meds: Current Outpatient Medications on File Prior to Visit  Medication Sig Dispense Refill   amiodarone (PACERONE) 200 MG tablet Take 1 tablet (200 mg total) by mouth 2 (two) times daily. 60 tablet 1   aspirin EC 81 MG tablet Take 1 tablet (81 mg total) by mouth daily. Swallow whole. 150 tablet 2   atorvastatin (LIPITOR) 80 MG tablet Take 1 tablet (80 mg total) by mouth daily. 30 tablet 2   carvedilol (COREG) 6.25 MG tablet Take 1 tablet (6.25 mg total) by mouth 2 (two) times daily with a meal. 60 tablet 2   clopidogrel (PLAVIX) 75 MG tablet Take 1 tablet (75 mg total) by mouth daily. 30 tablet 11   furosemide (LASIX) 40 MG tablet Take 1 tablet (40 mg total) by mouth daily. 60MG  (1-1/2 TAB) X3 DAYS THEN BACK TO 40MG  QD 30 tablet 3   losartan (COZAAR) 25 MG tablet Take 1 tablet  (25 mg total) by mouth daily. 30 tablet 3   primidone (MYSOLINE) 50 MG tablet Take 1/2 tablet at bedtime for 1 week, then increase to 1 tablet at bedtime (Patient taking differently: Take 50 mg by mouth daily.) 30 tablet 5   traMADol (ULTRAM) 50 MG tablet Take by mouth every 6 (six) hours as needed.     No current facility-administered medications on file prior to visit.     No Known Allergies Active Problems:   * No active hospital problems. *  There were no vitals taken for this visit. Vitals:   02/21/21 1058  BP: 123/80  Pulse: 75  Resp: 20  SpO2: 94%     CLINICAL DATA:  Shortness of breath. History of recent bypass surgery.   EXAM: CHEST - 2 VIEW   COMPARISON:  01/28/2021   FINDINGS: The cardiac silhouette, mediastinal and hilar contours are within normal limits and stable. Stable surgical changes from coronary artery bypass surgery. There is a persistent small left effusion and overlying atelectasis or infiltrate. New airspace opacity in the right lower lobe is suspicious for pneumonia.   IMPRESSION: New right lower lobe infiltrate.  Right   Persistent left pleural effusion and overlying atelectasis or infiltrate.  Electronically Signed   By: Rudie Meyer M.D.   On: 02/21/2021 11:09  Assessment & Plan  Fluid overload- he has 2+ pitting edema in bilateral extremities and a left pleural effusion on CXR. There is also a new right lower lobe infiltrate, no fever or chills per the patient. Vitals are stable. I would recommend lasix 60mg  x 5 days then returning to 40mg  daily. He did  not have his lasix with him today so I'm unsure if he has been taking it at all. I sent a new prescription.   He will also need a BMP done next week to check his renal function.   He is encouraged to drink more water and elevated his legs when sitting. He is encouraged to wear TED hose or compression ACE wrap on his bilateral lower legs to help with his edema.   Plan: I want to  see him back in the office in 2 weeks with another CXR to check on his infiltrate and pleural effusion. Otherwise, he is doing well and has been trying to get three walks in a day. Continue hourly IS.  New prescription: Lasix 60mg  daily for 5 days. The continue lasix 40mg  daily.     02/21/2021

## 2021-02-22 NOTE — Telephone Encounter (Signed)
Returned call to [facility]) she states that Hugo told pt and daughter to "stop for awhile" pt's tremors are "really bad" and would like re-start. I informed daughter that this information is not in Jesse's note nor did he inform me when checking out the pt after the appt. As far as I can infer from progress not, ok to re-start. Informed daughter to call Neuro, she states that pt has upcoming appt, may ask at that time or just re-start.

## 2021-02-22 NOTE — Telephone Encounter (Signed)
Im unsure why he was told to stop taking. Possibly so he could be started on DOAC for afib, but there is no documentation of this. Will have to send to [facility] for clarification

## 2021-02-22 NOTE — Telephone Encounter (Signed)
Pt's daughter [name] is following up on the previous conversation. [name] can be reached at [phone]

## 2021-02-23 DIAGNOSIS — Z951 Presence of aortocoronary bypass graft: Secondary | ICD-10-CM | POA: Diagnosis not present

## 2021-02-28 ENCOUNTER — Encounter: Payer: Self-pay | Admitting: Family Medicine

## 2021-02-28 ENCOUNTER — Ambulatory Visit: Payer: Medicare HMO | Admitting: Family Medicine

## 2021-02-28 VITALS — BP 110/67 | HR 70 | Ht 75.0 in | Wt 243.0 lb

## 2021-02-28 DIAGNOSIS — G250 Essential tremor: Secondary | ICD-10-CM

## 2021-02-28 MED ORDER — PRIMIDONE 50 MG PO TABS: ORAL_TABLET | ORAL | 1 refills | Status: DC

## 2021-02-28 NOTE — Patient Instructions (Signed)
Below is our plan:  We will increase primidone to 1-2 tablets daily for tremor. I will make sure your body is tolerating the primidone.   Please make sure you are staying well hydrated. I recommend 50-60 ounces daily. Well balanced diet and regular exercise encouraged. Consistent sleep schedule with 6-8 hours recommended.   Please continue follow up with care team as directed.   Follow up with Maralyn Sago in 6 months   You may receive a survey regarding today's visit. I encourage you to leave honest feed back as I do use this information to improve patient care. Thank you for seeing me today!

## 2021-02-28 NOTE — Progress Notes (Signed)
Chief Complaint  Patient presents with   Follow-up    RM 2, alone. 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 Primidone going well.     HISTORY OF PRESENT ILLNESS:  02/28/21 ALL:  Timothy Munoz is a 75 y.o. male here today for follow up for tremor. He was last seen by Margie Ege, NP in 08/2020. Propranolol was discontinued due to low heart rate. Primidone 50mg  daily at bedtime started. He has noticed improvement of tremor since. He is tolerating medication well with no obvious adverse effects. He is requesting to increase dose on days when tremor is worse.   He was admitted to the ER in 01/2021 for chest pain. Heart cath not successful. 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 He is recovering fairly well. He returns to see Dr 03/22/2021 on 10/25. He also has follow up with 11/25, NP with [facility] 03/16/2021.   08/29/20 SS: Timothy Munoz is a 75 year old male with history of tremor affecting both upper extremities.  His mother had Alzheimer's disease and Parkinson's disease with tremor.  His tremor worsens over time, has tremor with action, most problematic with handwriting, eating or drinking.  Tremor is greater in the right hand than the left.  When taking Xanax twice daily, resulted in drowsiness.  Currently on propanolol 20 mg in AM, thinks it is somewhat helpful, but heart rate is 51.  He lives alone, remains active.  Is retired.  Is due to see his primary care doctor.  Has had blood work done recently, reports was normal.  Here today for evaluation unaccompanied.   Update 02/29/2020 SS: Timothy Munoz is a 75 year old male with history of tremor affecting both upper extremities.  Reportedly his mother had Alzheimer's disease, and Parkinson's disease with tremor.  He takes Xanax as needed for tremor, never more than once a day, but he thinks he helps when he takes it.  The tremor is gradually progressing over time.  There are times he has significant difficulty with eating, stopped for  spaghetti recently, had terrible time.  Sometimes holding the steering well with his right hand, will notice tremoring, or holding something with a tight grip, affects handwriting.  Tremor more in the right hand than left.  No falls, balance is okay.  He is retired, but remains active.  His overall health is good.  Presents today for evaluation unaccompanied.   HISTORY 03/04/2019 Dr. 03/06/2019: Timothy Munoz is a 75 year old right-handed white male with a history of a tremor affecting both upper extremities for greater than 17 years.  The patient claims that his mother had Alzheimer's disease but she also had tremor but was told she had Parkinson's disease.  He knows of no other family members who have had tremor, he has 1 sister.  The patient indicates that over time, the tremor has gradually worsened.  He is having increasing problems with handwriting and with feeding himself, he likes to do a lot of fishing but has trouble tying the knots on the fishing line.  The patient does not have any tremor when resting the arms.  He reports no tremors associated with the head or neck or with the voice.  He reports some mild gait instability problems but no problems with falling.  He does have urinary frequency but no incontinence.  He reports no significant numbness or weakness of the extremities.  He comes to this office for an evaluation.    REVIEW OF SYSTEMS: Out of a complete 14  system review of symptoms, the patient complains only of the following symptoms, hand tremor and all other reviewed systems are negative.   ALLERGIES: No Known Allergies   HOME MEDICATIONS: Outpatient Medications Prior to Visit  Medication Sig Dispense Refill   amiodarone (PACERONE) 200 MG tablet Take 1 tablet (200 mg total) by mouth 2 (two) times daily. 60 tablet 1   aspirin EC 81 MG tablet Take 1 tablet (81 mg total) by mouth daily. Swallow whole. 150 tablet 2   atorvastatin (LIPITOR) 80 MG tablet Take 1 tablet (80 mg total) by  mouth daily. 30 tablet 2   carvedilol (COREG) 6.25 MG tablet Take 1 tablet (6.25 mg total) by mouth 2 (two) times daily with a meal. 60 tablet 2   clopidogrel (PLAVIX) 75 MG tablet Take 1 tablet (75 mg total) by mouth daily. 30 tablet 11   furosemide (LASIX) 40 MG tablet Take 1 tablet (40 mg total) by mouth daily. Take 60MG  (1-1/2 TAB) X5 DAYS THEN BACK TO 40MG  QD 30 tablet 3   losartan (COZAAR) 25 MG tablet Take 1 tablet (25 mg total) by mouth daily. 30 tablet 3   traMADol (ULTRAM) 50 MG tablet Take by mouth every 6 (six) hours as needed.     primidone (MYSOLINE) 50 MG tablet Take 1/2 tablet at bedtime for 1 week, then increase to 1 tablet at bedtime 30 tablet 5   No facility-administered medications prior to visit.     PAST MEDICAL HISTORY: Past Medical History:  Diagnosis Date   Hyperlipidemia    mild    Hypertension    Knee pain    Obesity    Tremor    Tremor, essential 03/04/2019     PAST SURGICAL HISTORY: Past Surgical History:  Procedure Laterality Date   APPENDECTOMY     BACK SURGERY     CORONARY ARTERY BYPASS GRAFT N/A 01/20/2021   Procedure: CORONARY ARTERY BYPASS GRAFTING (CABG) X 3 ON PUMP, USING LEFT INTERNAL MAMMARY ARTERY AND LEFT ENDOSCOPIC GREATER SAPHENOUS VEIN HARVEST CONDUITS;  Surgeon: 03/06/2019, MD;  Location: [facility] OR;  Service: Open Heart Surgery;  Laterality: N/A;   CORONARY/GRAFT ACUTE MI REVASCULARIZATION N/A 01/16/2021   Procedure: Coronary/Graft Acute MI Revascularization;  Surgeon: [name], MD;  Location: [facility] [place] INVASIVE CV LAB;  Service: Cardiovascular;  Laterality: N/A;   ENDOVEIN HARVEST OF GREATER SAPHENOUS VEIN Left 01/20/2021   Procedure: ENDOVEIN HARVEST OF GREATER SAPHENOUS VEIN;  Surgeon: [facility], MD;  Location: [facility] OR;  Service: Open Heart Surgery;  Laterality: Left;   HEMORRHOID SURGERY     IABP INSERTION N/A 01/16/2021   Procedure: IABP Insertion;  Surgeon: [facility], MD;  Location: [facility] INVASIVE CV LAB;  Service:  Cardiovascular;  Laterality: N/A;   LEFT HEART CATH AND CORONARY ANGIOGRAPHY N/A 01/16/2021   Procedure: LEFT HEART CATH AND CORONARY ANGIOGRAPHY;  Surgeon: Lennette Bihari, MD;  Location: [facility] INVASIVE CV LAB;  Service: Cardiovascular;  Laterality: N/A;   TEE WITHOUT CARDIOVERSION N/A 01/20/2021   Procedure: TRANSESOPHAGEAL ECHOCARDIOGRAM (TEE);  Surgeon: Lennette Bihari, MD;  Location: [facility] OR;  Service: Open Heart Surgery;  Laterality: N/A;     FAMILY HISTORY: Family History  Problem Relation Age of Onset   Hypertension Mother    Parkinson's disease Mother    Alzheimer's disease Mother    Hypertension Father    Suicidality Father      SOCIAL HISTORY: Social History   Socioeconomic History   Marital status: Single  Spouse name: Not on file   Number of children: Not on file   Years of education: Not on file   Highest education level: Not on file  Occupational History   Occupation: retired    Comment: Pharmacist, hospital  Tobacco Use   Smoking status: Former   Smokeless tobacco: Current    Types: Chew  Substance and Sexual Activity   Alcohol use: Yes    Comment: one beer every two months   Drug use: No   Sexual activity: Not on file  Other Topics Concern   Not on file  Social History Narrative   Right handed    Social Determinants of Health   Financial Resource Strain: Not on file  Food Insecurity: Not on file  Transportation Needs: Not on file  Physical Activity: Not on file  Stress: Not on file  Social Connections: Not on file  Intimate Partner Violence: Not on file     PHYSICAL EXAM  Vitals:   02/28/21 0908  BP: 110/67  Pulse: 70  Weight: 243 lb (110.2 kg)  Height: 6\' 3"  (1.905 m)   Body mass index is 30.37 kg/m.  Generalized: Well developed, in no acute distress  Cardiology: normal rate and rhythm, no murmur auscultated  Respiratory: clear to auscultation bilaterally    Neurological examination  Mentation: Alert oriented to time, place,  history taking. Follows all commands speech and language fluent Cranial nerve II-XII: Pupils were equal round reactive to light. Extraocular movements were full, visual field were full on confrontational test. Facial sensation and strength were normal. Uvula tongue midline. Head turning and shoulder shrug  were normal and symmetric. Motor: The motor testing reveals 5 over 5 strength of all 4 extremities. Good symmetric motor tone is noted throughout.  Sensory: Sensory testing is intact to soft touch on all 4 extremities. No evidence of extinction is noted.  Coordination: Cerebellar testing reveals good finger-nose-finger and heel-to-shin bilaterally.  Gait and station: Gait is normal. Tandem gait is normal. Romberg is negative. No drift is seen.  Reflexes: Deep tendon reflexes are symmetric and normal bilaterally.    DIAGNOSTIC DATA (LABS, IMAGING, TESTING) - I reviewed patient records, labs, notes, testing and imaging myself where available.  Lab Results  Component Value Date   WBC 12.7 (H) 01/28/2021   HGB 8.6 (L) 01/28/2021   HCT 27.0 (L) 01/28/2021   MCV 90.6 01/28/2021   PLT 590 (H) 01/28/2021      Component Value Date/Time   NA 137 01/29/2021 0032   K 4.1 01/29/2021 0032   CL 103 01/29/2021 0032   CO2 24 01/29/2021 0032   GLUCOSE 114 (H) 01/29/2021 0032   BUN 37 (H) 01/29/2021 0032   CREATININE 1.49 (H) 01/29/2021 0032   CREATININE 1.17 12/24/2016 1015   CALCIUM 8.2 (L) 01/29/2021 0032   PROT 6.4 (L) 01/16/2021 0739   ALBUMIN 3.7 01/16/2021 0739   AST 570 (H) 01/16/2021 0739   ALT 86 (H) 01/16/2021 0739   ALKPHOS 133 (H) 01/16/2021 0739   BILITOT 0.5 01/16/2021 0739   GFRNONAA 49 (L) 01/29/2021 0032   Lab Results  Component Value Date   CHOL 163 01/16/2021   HDL 42 01/16/2021   LDLCALC 114 (H) 01/16/2021   TRIG 33 01/16/2021   CHOLHDL 3.9 01/16/2021   Lab Results  Component Value Date   HGBA1C 6.0 (H) 01/16/2021   No results found for: 03/18/2021 Lab Results   Component Value Date   TSH 1.959 01/16/2021    No  flowsheet data found.   No flowsheet data found.   ASSESSMENT AND PLAN  75 y.o. year old male  has a past medical history of Hyperlipidemia, Hypertension, Knee pain, Obesity, Tremor, and Tremor, essential (03/04/2019). here with    Tremor, essential - Plan: Primidone level, primidone (MYSOLINE) 50 MG tablet  Timothy Munoz reports significant improvement of bilateral hand (R>L) on primidone. He is tolerating medication well. I will increase dose to 50-100mg  daily. He will call me with any unwanted side effects. We will update labs, today. He will continue close follow up with PCP and cardiology. I will have him return to see Maralyn Sago in 6 months. He verbalizes understanding and agreement with this plan.    Orders Placed This Encounter  Procedures   Primidone level      Meds ordered this encounter  Medications   primidone (MYSOLINE) 50 MG tablet    Sig: Take 1-2 tablets daily for tremor    Dispense:  180 tablet    Refill:  1    Order Specific Question:   Supervising Provider    Answer:   Anson Fret [[id number]]       Shawnie Dapper, MSN, FNP-C 02/28/2021, 9:46 AM  [facility] [address] [place], [place] [address] [phone]

## 2021-03-01 DIAGNOSIS — I2510 Atherosclerotic heart disease of native coronary artery without angina pectoris: Secondary | ICD-10-CM | POA: Diagnosis not present

## 2021-03-01 LAB — PRIMIDONE, SERUM
Phenobarbital, Serum: NOT DETECTED ug/mL (ref 15–40)
Primidone Lvl: 1.7 ug/mL — ABNORMAL LOW (ref 5.0–12.0)

## 2021-03-02 ENCOUNTER — Telehealth: Payer: Self-pay | Admitting: *Deleted

## 2021-03-02 NOTE — Telephone Encounter (Signed)
-----   Message from Shawnie Dapper, NP sent at 03/02/2021 10:14 AM EDT ----- Level was a little low but not concerning. We are ok to increase dose. TY!

## 2021-03-02 NOTE — Telephone Encounter (Signed)
Called and spoke w/ pt about lab results. Pt verbalized understanding.

## 2021-03-06 ENCOUNTER — Other Ambulatory Visit: Payer: Self-pay | Admitting: Thoracic Surgery (Cardiothoracic Vascular Surgery)

## 2021-03-06 DIAGNOSIS — Z951 Presence of aortocoronary bypass graft: Secondary | ICD-10-CM

## 2021-03-07 ENCOUNTER — Encounter: Payer: Self-pay | Admitting: Thoracic Surgery (Cardiothoracic Vascular Surgery)

## 2021-03-07 ENCOUNTER — Ambulatory Visit
Admission: RE | Admit: 2021-03-07 | Discharge: 2021-03-07 | Disposition: A | Discharge status: Home / Self Care | Payer: Medicare HMO | Source: Ambulatory Visit | Attending: Thoracic Surgery (Cardiothoracic Vascular Surgery) | Admitting: Thoracic Surgery (Cardiothoracic Vascular Surgery)

## 2021-03-07 ENCOUNTER — Ambulatory Visit (INDEPENDENT_AMBULATORY_CARE_PROVIDER_SITE_OTHER): Payer: Self-pay | Admitting: Thoracic Surgery (Cardiothoracic Vascular Surgery)

## 2021-03-07 ENCOUNTER — Other Ambulatory Visit: Payer: Self-pay

## 2021-03-07 VITALS — BP 97/61 | HR 71 | Resp 20 | Ht 75.0 in | Wt 236.0 lb

## 2021-03-07 DIAGNOSIS — R918 Other nonspecific abnormal finding of lung field: Secondary | ICD-10-CM | POA: Diagnosis not present

## 2021-03-07 DIAGNOSIS — Z951 Presence of aortocoronary bypass graft: Secondary | ICD-10-CM

## 2021-03-07 DIAGNOSIS — J984 Other disorders of lung: Secondary | ICD-10-CM | POA: Diagnosis not present

## 2021-03-07 DIAGNOSIS — J90 Pleural effusion, not elsewhere classified: Secondary | ICD-10-CM | POA: Diagnosis not present

## 2021-03-07 DIAGNOSIS — J9811 Atelectasis: Secondary | ICD-10-CM | POA: Diagnosis not present

## 2021-03-07 MED ORDER — AMIODARONE HCL 200 MG PO TABS: 200.0000 mg | ORAL_TABLET | Freq: Every day | ORAL | 1 refills | Status: DC

## 2021-03-07 NOTE — Progress Notes (Signed)
[address]       [place] [address]             [phone]     HPI: Mr. Weekley returns for a scheduled follow-up visit  Rosalie Gelpi is a 75 year old man with a history of hypertension and hyperlipidemia and also an essential tremor.  He presented with an ST elevation MI.  He underwent coronary bypass grafting x3 on 01/20/2021.  His postoperative course was complicated by atrial fibrillation.  He went home on amiodarone.  He saw Jari Favre in the office on 02/21/2021.  He was volume overloaded she increased his Lasix to 60 mg a day for a few days and then back to 40 mg daily.  He is currently on amiodarone 200 mg twice daily.  Since his last visit he has been feeling better.  He is not having any significant pain.  He is driving.  He has noticed improvement in his breathing.    Past Medical History:  Diagnosis Date   Hyperlipidemia    mild    Hypertension    Knee pain    Obesity    Tremor    Tremor, essential 03/04/2019    Current Outpatient Medications  Medication Sig Dispense Refill   aspirin EC 81 MG tablet Take 1 tablet (81 mg total) by mouth daily. Swallow whole. 150 tablet 2   atorvastatin (LIPITOR) 80 MG tablet Take 1 tablet (80 mg total) by mouth daily. 30 tablet 2   carvedilol (COREG) 6.25 MG tablet Take 1 tablet (6.25 mg total) by mouth 2 (two) times daily with a meal. 60 tablet 2   clopidogrel (PLAVIX) 75 MG tablet Take 1 tablet (75 mg total) by mouth daily. 30 tablet 11   furosemide (LASIX) 40 MG tablet Take 1 tablet (40 mg total) by mouth daily. Take 60MG  (1-1/2 TAB) X5 DAYS THEN BACK TO 40MG  QD 30 tablet 3   losartan (COZAAR) 25 MG tablet Take 1 tablet (25 mg total) by mouth daily. 30 tablet 3   primidone (MYSOLINE) 50 MG tablet Take 1-2 tablets daily for tremor 180 tablet 1   traMADol (ULTRAM) 50 MG tablet Take by mouth every 6 (six) hours as needed.     amiodarone (PACERONE) 200 MG tablet Take 1 tablet (200 mg total) by mouth daily. 60  tablet 1   No current facility-administered medications for this visit.    Physical Exam BP 97/61   Pulse 71   Resp 20   Ht 6\' 3"  (1.905 m)   Wt 236 lb (107 kg)   SpO2 95% Comment: RA  BMI 29.46 kg/m  75 year old man in no acute distress Alert and oriented x3 with no focal deficits Lungs slightly diminished at left base but otherwise clear Cardiac regular rate and rhythm Sternum stable, incision well-healed No peripheral edema  Diagnostic Tests: CHEST - 2 VIEW   COMPARISON:  02/21/2021   FINDINGS: status post median sternotomy and CABG procedure. Stable cardiomediastinal contours. Decreased volume of left pleural effusion. Small right effusion unchanged. Scar within the left midlung is stable. Persistent atelectasis within the retrocardiac left lower lobe. Persistent airspace opacities within the right upper lobe and right lower lobe.   IMPRESSION: 1. Decreased volume of left pleural effusion. 2. Persistent airspace opacities within the right upper lobe and right lower lobe.     Electronically Signed   By: 45 M.D.   On: 03/07/2021 09:56 I personally reviewed his chest x-ray images.  No change in the airspace opacities on the right.  Improved left pleural effusion.  Impression: Jamorian Dimaria is a 75 year old man with a history of hypertension, hyperlipidemia, and essential tremor who presented with a ST elevation MI.  He was temporized with an angioplasty and managed medically.  He underwent coronary bypass grafting on 01/20/2021.  Postoperatively he had atrial fibrillation.  Status post coronary bypass grafting-no recurrent angina.  Wounds are healing well.  He is now 6 weeks out so no longer has to be on restrictions in terms of weight but was advised to build into new activities gradually.  Encouraged him to continue with walking as he is doing at home.  Postoperative left pleural effusion -improved.  Continue Lasix.  Right lung airspace opacities-were  present on his last chest x-ray.  He has had some clear mucus but no fevers or chills.  No indication for antibiotic therapy.  Plan: Decrease amiodarone to 200 mg daily Follow-up with cardiology I will be happy to see Mr. Tomaszewski back anytime in the future if I can be of any further assistance with his care  [name], MD [facility] [phone]

## 2021-03-13 ENCOUNTER — Telehealth (HOSPITAL_COMMUNITY): Payer: Self-pay

## 2021-03-13 NOTE — Telephone Encounter (Signed)
Pt is not interested in the cardiac rehab at this time. ?Closed referral. ?

## 2021-03-15 NOTE — Progress Notes (Addendum)
Cardiology Clinic Note   Patient Name: Timothy Munoz Date of Encounter: 03/16/2021  Primary Care Provider:  Orpah Melter, MD Primary Cardiologist:  Shelva Majestic, MD  Patient Profile    Timothy Munoz 75 year old male presents to the clinic today for follow-up evaluation of his CAD status post CABG x3 on 01/20/2021.  Past Medical History    Past Medical History:  Diagnosis Date   Hyperlipidemia    mild    Hypertension    Knee pain    Obesity    Tremor    Tremor, essential 03/04/2019   Past Surgical History:  Procedure Laterality Date   APPENDECTOMY     BACK SURGERY     CORONARY ARTERY BYPASS GRAFT N/A 01/20/2021   Procedure: CORONARY ARTERY BYPASS GRAFTING (CABG) X 3 ON PUMP, USING LEFT INTERNAL MAMMARY ARTERY AND LEFT ENDOSCOPIC GREATER SAPHENOUS VEIN HARVEST CONDUITS;  Surgeon: Melrose Nakayama, MD;  Location: [place];  Service: Open Heart Surgery;  Laterality: N/A;   CORONARY/GRAFT ACUTE MI REVASCULARIZATION N/A 01/16/2021   Procedure: Coronary/Graft Acute MI Revascularization;  Surgeon: Troy Sine, MD;  Location: [place] [facility];  Service: Cardiovascular;  Laterality: N/A;   ENDOVEIN HARVEST OF GREATER SAPHENOUS VEIN Left 01/20/2021   Procedure: ENDOVEIN HARVEST OF GREATER SAPHENOUS VEIN;  Surgeon: Melrose Nakayama, MD;  Location: [place];  Service: Open Heart Surgery;  Laterality: Left;   HEMORRHOID SURGERY     IABP INSERTION N/A 01/16/2021   Procedure: IABP Insertion;  Surgeon: Troy Sine, MD;  Location: [place] [facility];  Service: Cardiovascular;  Laterality: N/A;   LEFT HEART CATH AND CORONARY ANGIOGRAPHY N/A 01/16/2021   Procedure: LEFT HEART CATH AND CORONARY ANGIOGRAPHY;  Surgeon: Troy Sine, MD;  Location: [place] [facility];  Service: Cardiovascular;  Laterality: N/A;   TEE WITHOUT CARDIOVERSION N/A 01/20/2021   Procedure: TRANSESOPHAGEAL ECHOCARDIOGRAM (TEE);  Surgeon: Melrose Nakayama, MD;  Location: [place];  Service: Open Heart Surgery;   Laterality: N/A;    Allergies  No Known Allergies  History of Present Illness    Timothy Munoz has a PMH of anterior lateral STEMI with balloon angioplasty of the ostial LAD, cardiogenic shock, LVEF 35% which required intra-aortic balloon pump for support, and CABG x3 on 01/20/2021.  He was noted to have postoperative atrial fibrillation and was placed on amiodarone and digoxin.  His echocardiogram 01/25/2021 showed an LVEF of 40-45%, G2 DD, normal right ventricular function, and no significant valvular abnormalities.   He presented to the clinic 02/07/2021 for follow-up evaluation stated he noticed some increased work of breathing when he laid back.  He continued to have +2 pitting bilateral lower extremity edema as well as forearm and hand edema.  He had been walking daily.  We reviewed the importance of heart healthy low-sodium diet and limiting fluid.  We reviewed his echocardiogram and started him on losartan 25 mg daily.  We planned to repeat a BMP in 1 week.  I  increased his furosemide to 60 mg x 3 days then planned return to 40 mg daily.   Follow-up was planned for 1 month.  He presents to the clinic today for follow-up evaluation states he is feeling much better.  He is only walking about 15 minutes once a day and occasionally twice a day.  His weight has decreased by about 40 pounds.  He does not appear to be fluid volume overloaded at this time.  He reports that his breathing  is much better.  He has not had any bleeding issues and denies palpitations.  He is now on amiodarone 200 mg daily.  We reviewed his recent chest x-ray.  He has noticed trouble with daily.  We discussed the importance of being able to climb 2 flights of stairs without increased work of breathing.  I advised him to contact the office once he is he is able to climb 2 flights of stairs and that we would consider prescribing medication for ED.  I will order a BMP today and plan to increase his losartan if his renal function  continues to be stable.  His blood pressure today is 116/62.  I plan to increase his losartan to 37.5 if his labs are stable.  I will give him the salty 6 diet sheet, have him continue to increase his physical activity, plan to repeat echocardiogram once guideline directed medical therapy has been optimized.  We will plan follow-up in 1 to 2 months.   Today he denies chest pain, shortness of breath, lower extremity edema, fatigue, palpitations, melena, hematuria, hemoptysis, diaphoresis, weakness, presyncope, syncope, orthopnea, and PND.  Home Medications    Prior to Admission medications   Medication Sig Start Date End Date Taking? Authorizing Provider  amiodarone (PACERONE) 200 MG tablet Take 1 tablet (200 mg total) by mouth daily. 03/07/21   Melrose Nakayama, MD  aspirin EC 81 MG tablet Take 1 tablet (81 mg total) by mouth daily. Swallow whole. 01/29/21 05/12/22  Antony Odea, PA-C  atorvastatin (LIPITOR) 80 MG tablet Take 1 tablet (80 mg total) by mouth daily. 01/29/21   Antony Odea, PA-C  carvedilol (COREG) 6.25 MG tablet Take 1 tablet (6.25 mg total) by mouth 2 (two) times daily with a meal. 01/29/21   Roddenberry, Arlis Porta, PA-C  clopidogrel (PLAVIX) 75 MG tablet Take 1 tablet (75 mg total) by mouth daily. 01/29/21 01/29/22  Antony Odea, PA-C  furosemide (LASIX) 40 MG tablet Take 1 tablet (40 mg total) by mouth daily. Take 60MG  (1-1/2 TAB) X5 DAYS THEN BACK TO 40MG  QD 02/21/21   Elgie Collard, PA-C  losartan (COZAAR) 25 MG tablet Take 1 tablet (25 mg total) by mouth daily. 02/07/21 05/08/21  Deberah Pelton, NP  primidone (MYSOLINE) 50 MG tablet Take 1-2 tablets daily for tremor 02/28/21   Lomax, Amy, NP  traMADol (ULTRAM) 50 MG tablet Take by mouth every 6 (six) hours as needed.    [provider]    Family History    Family History  Problem Relation Age of Onset   Hypertension Mother    Parkinson's disease Mother    Alzheimer's disease Mother     Hypertension Father    Suicidality Father    He indicated that his mother is deceased. He indicated that his father is deceased. He indicated that his sister is alive. He indicated that his child is alive.  Social History    Social History   Socioeconomic History   Marital status: Single    Spouse name: Not on file   Number of children: Not on file   Years of education: Not on file   Highest education level: Not on file  Occupational History   Occupation: retired    Comment: Clinical cytogeneticist  Tobacco Use   Smoking status: Former   Smokeless tobacco: Current    Types: Chew  Substance and Sexual Activity   Alcohol use: Yes    Comment: one beer every two months  Drug use: No   Sexual activity: Not on file  Other Topics Concern   Not on file  Social History Narrative   Right handed    Social Determinants of Health   Financial Resource Strain: Not on file  Food Insecurity: Not on file  Transportation Needs: Not on file  Physical Activity: Not on file  Stress: Not on file  Social Connections: Not on file  Intimate Partner Violence: Not on file     Review of Systems    General:  No chills, fever, night sweats or weight changes.  Cardiovascular:  No chest pain, dyspnea on exertion, edema, orthopnea, palpitations, paroxysmal nocturnal dyspnea. Dermatological: No rash, lesions/masses Respiratory: No cough, dyspnea Urologic: No hematuria, dysuria Abdominal:   No nausea, vomiting, diarrhea, bright red blood per rectum, melena, or hematemesis Neurologic:  No visual changes, wkns, changes in mental status. All other systems reviewed and are otherwise negative except as noted above.  Physical Exam    VS:  BP 116/62   Pulse 68   Ht 6\' 3"  (1.905 m)   Wt 229 lb 12.8 oz (104.2 kg)   SpO2 98%   BMI 28.72 kg/m  , BMI Body mass index is 28.72 kg/m. GEN: Well nourished, well developed, in no acute distress. HEENT: normal. Neck: Supple, no JVD, carotid bruits, or  masses. Cardiac: RRR, no murmurs, rubs, or gallops. No clubbing, cyanosis, edema.  Radials/DP/PT 2+ and equal bilaterally.  Respiratory:  Respirations regular and unlabored, clear to auscultation bilaterally. GI: Soft, nontender, nondistended, BS + x 4. MS: no deformity or atrophy. Skin: warm and dry, no rash. Neuro:  Strength and sensation are intact. Psych: Normal affect.  Accessory Clinical Findings    Recent Labs: 01/16/2021: ALT 86; B Natriuretic Peptide 44.2; TSH 1.959 01/21/2021: Magnesium 2.5 01/28/2021: Hemoglobin 8.6; Platelets 590 01/29/2021: BUN 37; Creatinine, Ser 1.49; Potassium 4.1; Sodium 137   Recent Lipid Panel    Component Value Date/Time   CHOL 163 01/16/2021 0739   TRIG 33 01/16/2021 0739   HDL 42 01/16/2021 0739   CHOLHDL 3.9 01/16/2021 0739   VLDL 7 01/16/2021 0739   LDLCALC 114 (H) 01/16/2021 0739    ECG personally reviewed by me today-none today.  EKG 02/07/2021 normal sinus rhythm anterior septal infarct undetermined age 80 bpm- No acute changes   Echocardiogram 01/16/2021 IMPRESSIONS     1. Left ventricular ejection fraction, by estimation, is 35%. The left  ventricle has moderately decreased function. The left ventricle  demonstrates regional wall motion abnormalities with basal to mid  anteroseptal, inferoseptal, and anterior akinesis,  apical anterior akinesis, apical inferior akinesis. There is mild left  ventricular hypertrophy. Left ventricular diastolic parameters are  consistent with Grade I diastolic dysfunction (impaired relaxation).   2. Hypokinesis of the apical RV. Right ventricular systolic function is  mildly reduced. The right ventricular size is normal. Tricuspid  regurgitation signal is inadequate for assessing PA pressure.   3. The mitral valve is normal in structure. Trivial mitral valve  regurgitation. No evidence of mitral stenosis.   4. The aortic valve is tricuspid. Aortic valve regurgitation is not  visualized. No aortic  stenosis is present.   5. Aortic dilatation noted. There is mild dilatation of the ascending  aorta, measuring 42 mm.   6. The inferior vena cava is normal in size with greater than 50%  respiratory variability, suggesting right atrial pressure of 3 mmHg.    Echocardiogram 01/25/2021 IMPRESSIONS     1. Global hypokinesis. Anteroseptal,  mid-apical inferoseptal, apicl  inferior, and apical akinesis. Left ventricular ejection fraction, by  estimation, is 40 to 45%. The left ventricle has mildly decreased  function. The left ventricle demonstrates  regional wall motion abnormalities (see scoring diagram/findings for  description). There is moderate asymmetric left ventricular hypertrophy of  the basal-septal segment. Left ventricular diastolic parameters are  consistent with Grade II diastolic  dysfunction (pseudonormalization). The average left ventricular global  longitudinal strain is -8.8 %. The global longitudinal strain is abnormal.   2. Right ventricular systolic function is normal. The right ventricular  size is normal. There is normal pulmonary artery systolic pressure.   3. Small pericarcdial effusion measuring 0.74 cm. a small pericardial  effusion is present. The pericardial effusion is posterior to the left  ventricle.   4. The mitral valve is normal in structure. No evidence of mitral valve  regurgitation. No evidence of mitral stenosis.   5. The aortic valve is tricuspid. Aortic valve regurgitation is not  visualized. No aortic stenosis is present.   6. Aortic dilatation noted. There is mild dilatation of the ascending  aorta, measuring 41 mm. There is mild dilatation of the aortic root,  measuring 43 mm.   7. The inferior vena cava is normal in size with <50% respiratory  variability, suggesting right atrial pressure of 8 mmHg.   Cardiac catheterization 01/16/2021   Mid LM to Dist LM lesion is 20% stenosed.   Ost LAD to Prox LAD lesion is 100% stenosed.   Ost Cx lesion  is 90% stenosed.   Ost RCA lesion is 20% stenosed.   Post intervention, there is a 60% residual stenosis.   There is moderate left ventricular systolic dysfunction.   LV end diastolic pressure is moderately elevated.   The left ventricular ejection fraction is 35-45% by visual estimate.   Acute anterolateral myocardial infarction secondary to total near ostial occlusion of a large LAD which wraps around the LV apex.   Large normal ramus intermediate vessel.   Left circumflex vessel with 85 to 90% ostial stenosis.   Large RCA with 20% irregularity proximally.   PTCA of the LAD ostium with hypotension and idioventricular rhythm with reperfusion requiring initiation of Levophed reestablishment of TIMI-3 flow to a large LAD vessel which wraps around the LV apex.   Insertion of an intra-aortic balloon pump for optimal hemodynamic support with augmented diastolic pressure at 150 millimeters mercury.   RECOMMENDATION: In light of the patient's complex anatomy, stenting was not performed due to high likelihood of jailing a very large ramus intermediate vessel and with concomitant ostial circumflex stenosis it was felt that surgical revascularization once patient stabilizes from his myocardial infarction would be optimal therapy.  He did receive 1 dose of oral Brilinta in the laboratory which will not be continued.  We will plan 2D echo Doppler today.  Initiate aggressive lipid-lowering therapy.  Dr. Cliffton Asters was contacted who will see the patient and depending upon the patient;s hemodynamic stability perform CABG revascularization surgery this week following washout of his initial Brilinta dose.   Diagnostic Dominance: Right Intervention      Assessment & Plan   1.  Coronary artery disease-denies chest pain today.  Sternal surgical site continues well-healed and approximated.  Admitted to the hospital on 01/16/2021 with chest discomfort/STEMI and was taken to cardiac Cath Lab emergently.  He  required intra-aortic balloon pump support for an EF of 35%.  He received angioplasty to his ostial LAD which was suboptimal.  Cardiovascular surgery  was consulted and he underwent CABG x3 on 01/20/2021.  Noted to have postoperative atrial fibrillation. Continue aspirin, atorvastatin, carvedilol, Plavix, amiodarone Heart healthy low-sodium diet-salty 6 given Increase physical activity as tolerated-maintain sternal precautions   Ischemic cardiomyopathy-continues to increase physical activity.  Postoperative echocardiogram showed slight improvement in LVEF to 40-45%. Continue carvedilol, losartan Decrease furosemide to 20 mg daily Heart healthy low-sodium diet-salty 6 given Increase physical activity as tolerated Order BMP  Plan to increase losartan to 37.5 if BMP remained stable. Will continue to titrate guideline directed medical therapy and repeat echocardiogram once medications been optimized.   Atrial fibrillation-heart rate today 68 bpm.  Noted to have postoperative atrial fibrillation.   Continue amiodarone, aspirin, carvedilol, clopidogrel Avoid triggers caffeine, chocolate, EtOH, dehydration etc.   Hyperlipidemia-01/16/2021: Cholesterol 163; HDL 42; LDL Cholesterol 114; Triglycerides 33; VLDL 7 Continue aspirin, atorvastatin Heart healthy low-sodium high-fiber diet Increase physical activity as tolerated   Disposition: Follow-up with Dr. Claiborne Billings or me in 1-2 months.   Jossie Ng. Joelyn Lover NP-C    03/16/2021, 9:41 AM [place] [address] Office [phone] Fax [phone]  Notice: This dictation was prepared with Dragon dictation along with smaller phrase technology. Any transcriptional errors that result from this process are unintentional and may not be corrected upon review.  I spent 14 minutes examining this patient, reviewing medications, and using patient centered shared decision making involving her cardiac care.  Prior to her visit  I spent greater than 20 minutes reviewing her past medical history,  medications, and prior cardiac tests.

## 2021-03-16 ENCOUNTER — Encounter: Payer: Self-pay | Admitting: General Practice

## 2021-03-16 ENCOUNTER — Ambulatory Visit: Payer: Medicare HMO | Admitting: General Practice

## 2021-03-16 ENCOUNTER — Other Ambulatory Visit: Payer: Self-pay

## 2021-03-16 VITALS — BP 116/62 | HR 68 | Ht 75.0 in | Wt 229.8 lb

## 2021-03-16 DIAGNOSIS — I255 Ischemic cardiomyopathy: Secondary | ICD-10-CM | POA: Diagnosis not present

## 2021-03-16 DIAGNOSIS — I4891 Unspecified atrial fibrillation: Secondary | ICD-10-CM

## 2021-03-16 DIAGNOSIS — Z951 Presence of aortocoronary bypass graft: Secondary | ICD-10-CM

## 2021-03-16 DIAGNOSIS — E785 Hyperlipidemia, unspecified: Secondary | ICD-10-CM | POA: Diagnosis not present

## 2021-03-16 LAB — BASIC METABOLIC PANEL
BUN/Creatinine Ratio: 17 (ref 10–24)
BUN: 25 mg/dL (ref 8–27)
CO2: 24 mmol/L (ref 20–29)
Calcium: 9.1 mg/dL (ref 8.6–10.2)
Chloride: 97 mmol/L (ref 96–106)
Creatinine, Ser: 1.48 mg/dL — ABNORMAL HIGH (ref 0.76–1.27)
Glucose: 96 mg/dL (ref 70–99)
Potassium: 4.2 mmol/L (ref 3.5–5.2)
Sodium: 136 mmol/L (ref 134–144)
eGFR: 49 mL/min/{1.73_m2} — ABNORMAL LOW (ref >59)

## 2021-03-16 NOTE — Patient Instructions (Signed)
Medication Instructions:  Your physician recommends that you continue on your current medications as directed. Please refer to the Current Medication list given to you today.  *If you need a refill on your cardiac medications before your next appointment, please call your pharmacy*  Lab Work: Your physician recommends that you return for lab work TODAY:  BMET  If you have labs (blood work) drawn today and your tests are completely normal, you will receive your results only by: MyChart Message (if you have MyChart) OR A paper copy in the mail If you have any lab test that is abnormal or we need to change your treatment, we will call you to review the results.  Testing/Procedures: NONE ordered at this time of appointment   Follow-Up: At [facility], you and your health needs are our priority.  As part of our continuing mission to provide you with exceptional heart care, we have created designated Provider Care Teams.  These Care Teams include your primary Cardiologist (physician) and Advanced Practice Providers (APPs -  Physician Assistants and Nurse Practitioners) who all work together to provide you with the care you need, when you need it.  We recommend signing up for the patient portal called "MyChart".  Sign up information is provided on this After Visit Summary.  MyChart is used to connect with patients for Virtual Visits (Telemedicine).  Patients are able to view lab/test results, encounter notes, upcoming appointments, etc.  Non-urgent messages can be sent to your provider as well.   To learn more about what you can do with MyChart, go to [email or web address].    Your next appointment:   1-2 month(s)  The format for your next appointment:   In Person  Provider:   Edd Fabian, FNP  Other Instructions INCREASE physical activity to 20 mins a day for 1 -2 times a week When you are able to walk/climb 2 flights of stairs without stopping please giv eour office a  call. Continue Low Sodium Diet   How to Increase Your Level of Physical Activity Getting regular physical activity is important for your overall health and well-being. Most people do not get enough exercise. There are easy ways to increase your level of physical activity, even if you have not been very active in the past or if you are just starting out. What are the benefits of physical activity? Physical activity has many short-term and long-term benefits. Being active on a regular basis can improve your physical and mental health as well as provide other benefits. Physical health benefits Helping you lose weight or maintain a healthy weight. Strengthening your muscles and bones. Reducing your risk of certain long-term (chronic) diseases, including heart disease, cancer, and diabetes. Being able to move around more easily and for longer periods of time without getting tired (increased endurance or stamina). Improving your ability to fight off illness (enhanced immunity). Being able to sleep better. Helping you stay healthy as you get older, including: Helping you stay mobile, or capable of walking and moving around. Preventing accidents, such as falls. Increasing life expectancy. Mental health benefits Boosting your mood and improving your self-esteem. Lowering your chance of having mental health problems, such as depression or anxiety. Helping you feel good about your body. Other benefits Finding new sources of fun and enjoyment. Meeting new people who share a common interest. Before you begin If you have a chronic illness or have not been active for a while, check with your health care provider about how to get started.  Ask your health care provider what activities are safe for you. Start out slowly. Walking or doing some simple chair exercises is a good place to start, especially if you have not been active before or for a long time. Set goals that you can work toward. Ask your  health care provider how much exercise is best for you. In general, most adults should: Do moderate-intensity exercise for at least 150 minutes each week (30 minutes on most days of the week) or vigorous exercise for at least 75 minutes each week, or a combination of these. Moderate-intensity exercise can include walking at a quick pace, biking, yoga, water aerobics, or gardening. Vigorous exercise involves activities that take more effort, such as jogging or running, playing sports, swimming laps, or jumping rope. Do strength exercises on at least 2 days each week. This can include weight lifting, body weight exercises, and resistance-band exercises. How to be more physically active Make a plan  Try to find activities that you enjoy. You are more likely to commit to an exercise routine if it does not feel like a chore. If you have bone or joint problems, choose low-impact exercises, like walking or swimming. Use these tips for being successful with an exercise plan: Find a workout partner for accountability. Join a group or class, such as an aerobics class, cycling class, or sports team. Make family time active. Go for a walk, bike, or swim. Include a variety of exercises each week. Consider using a fitness tracker, such as a mobile phone app or a device worn like a watch, that will count the number of steps you take each day. Many people strive to reach 10,000 steps a day. Find ways to be active in your daily routines Besides your formal exercise plans, you can find ways to do physical activity during your daily routines, such as: Walking or biking to work or to the store. Taking the stairs instead of the elevator. Parking farther away from the door at work or at the store. Planning walking meetings. Walking around while you are on the phone. Where to find more information Centers for Disease Control and Prevention: CampusCasting.com.pt [employer]'s Council on Fitness, Sports &  Nutrition: www.fitness.gov ChooseMyPlate: http://www.harvey.com/ Contact a health care provider if: You have headaches, muscle aches, or joint pain that is concerning. You feel dizzy or light-headed while exercising. You faint. You feel your heart skipping, racing, or fluttering. You have chest pain while exercising. Summary Exercise benefits your mind and body at any age, even if you are just starting out. If you have a chronic illness or have not been active for a while, check with your health care provider before increasing your physical activity. Choose activities that are safe and enjoyable for you. Ask your health care provider what activities are safe for you. Start slowly. Tell your health care provider if you have problems as you start to increase your activity level. This information is not intended to replace advice given to you by your health care provider. Make sure you discuss any questions you have with your health care provider. Document Revised: 08/26/2020 Document Reviewed: 08/26/2020 Elsevier Patient Education  2022 [employer].

## 2021-05-03 ENCOUNTER — Other Ambulatory Visit: Payer: Self-pay | Admitting: Cardiovascular Disease

## 2021-05-23 NOTE — Progress Notes (Signed)
Cardiology Clinic Note   Patient Name: Timothy Munoz Date of Encounter: 05/26/2021  Primary Care Provider:  Orpah Melter, MD Primary Cardiologist:  Shelva Majestic, MD  Patient Profile    Timothy Munoz 76 year old male presents to the clinic today for follow-up evaluation of his ischemic cardiomyopathy and CAD status post CABG x3 on 01/20/2021.  Past Medical History    Past Medical History:  Diagnosis Date   Hyperlipidemia    mild    Hypertension    Knee pain    Obesity    Tremor    Tremor, essential 03/04/2019   Past Surgical History:  Procedure Laterality Date   APPENDECTOMY     BACK SURGERY     CORONARY ARTERY BYPASS GRAFT N/A 01/20/2021   Procedure: CORONARY ARTERY BYPASS GRAFTING (CABG) X 3 ON PUMP, USING LEFT INTERNAL MAMMARY ARTERY AND LEFT ENDOSCOPIC GREATER SAPHENOUS VEIN HARVEST CONDUITS;  Surgeon: Melrose Nakayama, MD;  Location: [place];  Service: Open Heart Surgery;  Laterality: N/A;   CORONARY/GRAFT ACUTE MI REVASCULARIZATION N/A 01/16/2021   Procedure: Coronary/Graft Acute MI Revascularization;  Surgeon: Troy Sine, MD;  Location: [place] [facility];  Service: Cardiovascular;  Laterality: N/A;   ENDOVEIN HARVEST OF GREATER SAPHENOUS VEIN Left 01/20/2021   Procedure: ENDOVEIN HARVEST OF GREATER SAPHENOUS VEIN;  Surgeon: Melrose Nakayama, MD;  Location: [place];  Service: Open Heart Surgery;  Laterality: Left;   HEMORRHOID SURGERY     IABP INSERTION N/A 01/16/2021   Procedure: IABP Insertion;  Surgeon: Troy Sine, MD;  Location: [place] [facility];  Service: Cardiovascular;  Laterality: N/A;   LEFT HEART CATH AND CORONARY ANGIOGRAPHY N/A 01/16/2021   Procedure: LEFT HEART CATH AND CORONARY ANGIOGRAPHY;  Surgeon: Troy Sine, MD;  Location: [place] [facility];  Service: Cardiovascular;  Laterality: N/A;   TEE WITHOUT CARDIOVERSION N/A 01/20/2021   Procedure: TRANSESOPHAGEAL ECHOCARDIOGRAM (TEE);  Surgeon: Melrose Nakayama, MD;  Location: [place];   Service: Open Heart Surgery;  Laterality: N/A;    Allergies  No Known Allergies  History of Present Illness    Timothy Munoz has a PMH of anterior lateral STEMI with balloon angioplasty of the ostial LAD, cardiogenic shock, LVEF 35% which required intra-aortic balloon pump for support, and CABG x3 on 01/20/2021.  He was noted to have postoperative atrial fibrillation and was placed on amiodarone and digoxin.  His echocardiogram 01/25/2021 showed an LVEF of 40-45%, G2 DD, normal right ventricular function, and no significant valvular abnormalities.   He presented to the clinic 02/07/2021 for follow-up evaluation stated he noticed some increased work of breathing when he laid back.  He continued to have +2 pitting bilateral lower extremity edema as well as forearm and hand edema.  He had been walking daily.  We reviewed the importance of heart healthy low-sodium diet and limiting fluid.  We reviewed his echocardiogram and started him on losartan 25 mg daily.  We planned to repeat a BMP in 1 week.  I  increased his furosemide to 60 mg x 3 days then planned return to 40 mg daily.   Follow-up was planned for 1 month.  He presented to the clinic 03/16/2021 for follow-up evaluation stated he was feeling much better.  He was only walking about 15 minutes once a day and occasionally twice a day.  His weight had decreased by about 40 pounds.  He did not appear to be fluid volume overloaded at the time.  He reported  that his breathing was much better.  He had not had any bleeding issues and denied palpitations.  He was on amiodarone 200 mg daily.  We reviewed his chest x-ray.  We discussed the importance of being able to climb 2 flights of stairs without increased work of breathing.  I advised him to contact the office once he is he is able to climb 2 flights of stairs and that we would consider prescribing medication for ED.  I  ordered a BMP  and planned to increase his losartan if his renal function continued to  be stable.  His blood pressure today is 116/62.  I plan to increase his losartan to 37.5 if his labs are stable.  I gave him the salty 6 diet sheet, asked him to continue to increase his physical activity, planned to repeat echocardiogram once guideline directed medical therapy had been optimized.  Follow-up was planned for 1-2 months.  History of PE BMP showed stable but continued elevated creatinine at 1.48.  His furosemide was reduced and his losartan was maintained at 25 mg.  He presents to the clinic today for follow-up evaluation states he feels well.  He reports that he was able to climb the stairs from the parking deck to our office this morning without any trouble.  He continues to lose weight.  He has not had as big of an appetite since being discharged from the hospital.  His weight today is 224 pounds and has remained stable at home.  I will change his furosemide to 20 mg as needed.  We will prescribe sildenafil 20 mg (precautions surrounding nitrates discussed).  I will have him maintain his physical activity and current heart healthy low-sodium diet.  We will repeat a BMP and schedule a follow-up echocardiogram.  We will plan follow-up with Dr. Claiborne Billings in 4 months.  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   Today he denies chest pain, shortness of breath, lower extremity edema, fatigue, palpitations, melena, hematuria, hemoptysis, diaphoresis, weakness, presyncope, syncope, orthopnea, and PND.  Home Medications    Prior to Admission medications   Medication Sig Start Date End Date Taking? Authorizing Provider  amiodarone (PACERONE) 200 MG tablet Take 1 tablet (200 mg total) by mouth daily. 03/07/21   Melrose Nakayama, MD  aspirin EC 81 MG tablet Take 1 tablet (81 mg total) by mouth daily. Swallow whole. 01/29/21 05/12/22  Antony Odea, PA-C  atorvastatin (LIPITOR) 80 MG tablet Take 1 tablet (80 mg total) by mouth daily. 01/29/21   Antony Odea, PA-C  carvedilol (COREG) 6.25 MG tablet Take 1 tablet (6.25 mg total) by mouth 2 (two) times daily with a meal. 01/29/21   Roddenberry, Arlis Porta, PA-C  clopidogrel (PLAVIX) 75 MG tablet Take 1 tablet (75 mg total) by mouth daily. 01/29/21 01/29/22  Antony Odea, PA-C  furosemide (LASIX) 40 MG tablet Take 1 tablet (40 mg total) by mouth daily. Take 60MG  (1-1/2 TAB) X5 DAYS THEN BACK TO 40MG  QD 02/21/21   Elgie Collard, PA-C  losartan (COZAAR) 25 MG tablet Take 1 tablet (25 mg total) by mouth daily. 02/07/21 05/08/21  Deberah Pelton, NP  primidone (MYSOLINE) 50 MG tablet Take 1-2 tablets daily for tremor 02/28/21   Lomax, Amy, NP  traMADol (ULTRAM) 50 MG tablet Take by mouth every 6 (six) hours as needed.    [provider]    Family History  Family History  Problem Relation Age of Onset   Hypertension Mother    Parkinson's disease Mother    Alzheimer's disease Mother    Hypertension Father    Suicidality Father    He indicated that his mother is deceased. He indicated that his father is deceased. He indicated that his sister is alive. He indicated that his child is alive.   Social History    Social History   Socioeconomic History   Marital status: Single    Spouse name: Not on file   Number of children: Not on file   Years of education: Not on file   Highest education level: Not on file  Occupational History   Occupation: retired    Comment: Clinical cytogeneticist  Tobacco Use   Smoking status: Former   Smokeless tobacco: Current    Types: Chew  Substance and Sexual Activity   Alcohol use: Yes    Comment: one beer every two months   Drug use: No   Sexual activity: Not on file  Other Topics Concern   Not on file  Social History Narrative   Right handed    Social Determinants of Health   Financial Resource Strain: Not on file  Food Insecurity: Not on file  Transportation Needs: Not on file  Physical Activity: Not on file  Stress: Not on file   Social Connections: Not on file  Intimate Partner Violence: Not on file     Review of Systems    General:  No chills, fever, night sweats or weight changes.  Cardiovascular:  No chest pain, dyspnea on exertion, edema, orthopnea, palpitations, paroxysmal nocturnal dyspnea. Dermatological: No rash, lesions/masses Respiratory: No cough, dyspnea Urologic: No hematuria, dysuria Abdominal:   No nausea, vomiting, diarrhea, bright red blood per rectum, melena, or hematemesis Neurologic:  No visual changes, wkns, changes in mental status. All other systems reviewed and are otherwise negative except as noted above.  Physical Exam    VS:  BP 118/72 (BP Location: Left Arm, Patient Position: Sitting, Cuff Size: Large)    Pulse 63    Wt 224 lb (101.6 kg)    BMI 28.00 kg/m  , BMI Body mass index is 28 kg/m. GEN: Well nourished, well developed, in no acute distress. HEENT: normal. Neck: Supple, no JVD, carotid bruits, or masses. Cardiac: RRR, no murmurs, rubs, or gallops. No clubbing, cyanosis, edema.  Radials/DP/PT 2+ and equal bilaterally.  Respiratory:  Respirations regular and unlabored, clear to auscultation bilaterally. GI: Soft, nontender, nondistended, BS + x 4. MS: no deformity or atrophy. Skin: warm and dry, no rash. Neuro:  Strength and sensation are intact. Psych: Normal affect.  Accessory Clinical Findings    Recent Labs: 01/16/2021: ALT 86; B Natriuretic Peptide 44.2; TSH 1.959 01/21/2021: Magnesium 2.5 01/28/2021: Hemoglobin 8.6; Platelets 590 03/16/2021: BUN 25; Creatinine, Ser 1.48; Potassium 4.2; Sodium 136   Recent Lipid Panel    Component Value Date/Time   CHOL 163 01/16/2021 0739   TRIG 33 01/16/2021 0739   HDL 42 01/16/2021 0739   CHOLHDL 3.9 01/16/2021 0739   VLDL 7 01/16/2021 0739   LDLCALC 114 (H) 01/16/2021 0739    ECG personally reviewed by me today-none today.  EKG 02/07/2021 normal sinus rhythm anterior septal infarct undetermined age 80 bpm- No acute  changes   Echocardiogram 01/16/2021 IMPRESSIONS     1. Left ventricular ejection fraction, by estimation, is 35%. The left  ventricle has moderately decreased function. The left ventricle  demonstrates regional wall motion abnormalities  with basal to mid  anteroseptal, inferoseptal, and anterior akinesis,  apical anterior akinesis, apical inferior akinesis. There is mild left  ventricular hypertrophy. Left ventricular diastolic parameters are  consistent with Grade I diastolic dysfunction (impaired relaxation).   2. Hypokinesis of the apical RV. Right ventricular systolic function is  mildly reduced. The right ventricular size is normal. Tricuspid  regurgitation signal is inadequate for assessing PA pressure.   3. The mitral valve is normal in structure. Trivial mitral valve  regurgitation. No evidence of mitral stenosis.   4. The aortic valve is tricuspid. Aortic valve regurgitation is not  visualized. No aortic stenosis is present.   5. Aortic dilatation noted. There is mild dilatation of the ascending  aorta, measuring 42 mm.   6. The inferior vena cava is normal in size with greater than 50%  respiratory variability, suggesting right atrial pressure of 3 mmHg.    Echocardiogram 01/25/2021 IMPRESSIONS     1. Global hypokinesis. Anteroseptal, mid-apical inferoseptal, apicl  inferior, and apical akinesis. Left ventricular ejection fraction, by  estimation, is 40 to 45%. The left ventricle has mildly decreased  function. The left ventricle demonstrates  regional wall motion abnormalities (see scoring diagram/findings for  description). There is moderate asymmetric left ventricular hypertrophy of  the basal-septal segment. Left ventricular diastolic parameters are  consistent with Grade II diastolic  dysfunction (pseudonormalization). The average left ventricular global  longitudinal strain is -8.8 %. The global longitudinal strain is abnormal.   2. Right ventricular systolic  function is normal. The right ventricular  size is normal. There is normal pulmonary artery systolic pressure.   3. Small pericarcdial effusion measuring 0.74 cm. a small pericardial  effusion is present. The pericardial effusion is posterior to the left  ventricle.   4. The mitral valve is normal in structure. No evidence of mitral valve  regurgitation. No evidence of mitral stenosis.   5. The aortic valve is tricuspid. Aortic valve regurgitation is not  visualized. No aortic stenosis is present.   6. Aortic dilatation noted. There is mild dilatation of the ascending  aorta, measuring 41 mm. There is mild dilatation of the aortic root,  measuring 43 mm.   7. The inferior vena cava is normal in size with <50% respiratory  variability, suggesting right atrial pressure of 8 mmHg.   Cardiac catheterization 01/16/2021   Mid LM to Dist LM lesion is 20% stenosed.   Ost LAD to Prox LAD lesion is 100% stenosed.   Ost Cx lesion is 90% stenosed.   Ost RCA lesion is 20% stenosed.   Post intervention, there is a 60% residual stenosis.   There is moderate left ventricular systolic dysfunction.   LV end diastolic pressure is moderately elevated.   The left ventricular ejection fraction is 35-45% by visual estimate.   Acute anterolateral myocardial infarction secondary to total near ostial occlusion of a large LAD which wraps around the LV apex.   Large normal ramus intermediate vessel.   Left circumflex vessel with 85 to 90% ostial stenosis.   Large RCA with 20% irregularity proximally.   PTCA of the LAD ostium with hypotension and idioventricular rhythm with reperfusion requiring initiation of Levophed reestablishment of TIMI-3 flow to a large LAD vessel which wraps around the LV apex.   Insertion of an intra-aortic balloon pump for optimal hemodynamic support with augmented diastolic pressure at Q000111Q millimeters mercury.   RECOMMENDATION: In light of the patient's complex anatomy, stenting was  not performed due to high likelihood  of jailing a very large ramus intermediate vessel and with concomitant ostial circumflex stenosis it was felt that surgical revascularization once patient stabilizes from his myocardial infarction would be optimal therapy.  He did receive 1 dose of oral Brilinta in the laboratory which will not be continued.  We will plan 2D echo Doppler today.  Initiate aggressive lipid-lowering therapy.  Dr. Kipp Brood was contacted who will see the patient and depending upon the patient;s hemodynamic stability perform CABG revascularization surgery this week following washout of his initial Brilinta dose.   Diagnostic Dominance: Right Intervention      Assessment & Plan   Ischemic cardiomyopathy-continues to increase physical activity.  Postoperative echocardiogram showed slight improvement in LVEF to 40-45%.  Weight is now down to 224 pounds.  Euvolemic. Continue carvedilol, losartan,  Change furosemide 20 mg as needed for weight increase of 2 pounds overnight or 5 pounds in 1 week Heart healthy low-sodium diet-salty 6 given Increase physical activity as tolerated Repeat BMP  Will continue to titrate guideline directed medical therapy and repeat echocardiogram once medications been optimized. Repeat echocardiogram Continue weight log  Coronary artery disease- denies chest pain today.   Admitted to the hospital on 01/16/2021 with chest discomfort/STEMI and was taken to cardiac Cath Lab emergently.  He required intra-aortic balloon pump support for an EF of 35%.  He received angioplasty to his ostial LAD which was suboptimal.  Cardiovascular surgery was consulted and he underwent CABG x3 on 01/20/2021.  Noted to have postoperative atrial fibrillation.  Continues to progress with physical activity. Continue aspirin, atorvastatin, carvedilol, Plavix, amiodarone Heart healthy low-sodium diet-salty 6 given Increase physical activity as tolerated   Atrial fibrillation-heart rate  today 63 bpm.  Noted postoperatively.  Denies further episodes of irregular or accelerated heartbeat. Continue  aspirin, carvedilol, clopidogrel Stop amiodarone Avoid triggers caffeine, chocolate, EtOH, dehydration etc. Order 30-day cardiac event monitor  Hyperlipidemia-01/16/2021: Cholesterol 163; HDL 42; LDL Cholesterol 114; Triglycerides 33; VLDL 7.  Has been out of his atorvastatin for around 1 month. Continue aspirin, atorvastatin Heart healthy low-sodium high-fiber diet Increase physical activity as tolerated Recommend repeat fasting lipids and LFTs during next appointment  Disposition: Follow-up with Dr. Claiborne Billings or me in 3-4 months.   Jossie Ng. Smith Potenza NP-C    05/26/2021, 9:52 AM [address] Office [phone] Fax [phone]  Notice: This dictation was prepared with Dragon dictation along with smaller phrase technology. Any transcriptional errors that result from this process are unintentional and may not be corrected upon review.  I spent 14 minutes examining this patient, reviewing medications, and using patient centered shared decision making involving her cardiac care.  Prior to her visit I spent greater than 20 minutes reviewing her past medical history,  medications, and prior cardiac tests.

## 2021-05-26 ENCOUNTER — Encounter: Payer: Self-pay | Admitting: General Practice

## 2021-05-26 ENCOUNTER — Other Ambulatory Visit: Payer: Self-pay

## 2021-05-26 ENCOUNTER — Ambulatory Visit: Payer: Medicare HMO | Admitting: General Practice

## 2021-05-26 VITALS — BP 118/72 | HR 63 | Wt 224.0 lb

## 2021-05-26 DIAGNOSIS — E785 Hyperlipidemia, unspecified: Secondary | ICD-10-CM | POA: Diagnosis not present

## 2021-05-26 DIAGNOSIS — I4891 Unspecified atrial fibrillation: Secondary | ICD-10-CM

## 2021-05-26 DIAGNOSIS — Z79899 Other long term (current) drug therapy: Secondary | ICD-10-CM | POA: Diagnosis not present

## 2021-05-26 DIAGNOSIS — Z951 Presence of aortocoronary bypass graft: Secondary | ICD-10-CM | POA: Diagnosis not present

## 2021-05-26 DIAGNOSIS — I255 Ischemic cardiomyopathy: Secondary | ICD-10-CM | POA: Diagnosis not present

## 2021-05-26 MED ORDER — FUROSEMIDE 20 MG PO TABS: 20.0000 mg | ORAL_TABLET | Freq: Every day | ORAL | 6 refills | Status: DC | PRN

## 2021-05-26 MED ORDER — FUROSEMIDE 20 MG PO TABS: 20.0000 mg | ORAL_TABLET | Freq: Every day | ORAL | 6 refills | Status: DC

## 2021-05-26 MED ORDER — ATORVASTATIN CALCIUM 80 MG PO TABS: 80.0000 mg | ORAL_TABLET | Freq: Every day | ORAL | 6 refills | Status: DC

## 2021-05-26 MED ORDER — SILDENAFIL CITRATE 25 MG PO TABS: 25.0000 mg | ORAL_TABLET | Freq: Every day | ORAL | 0 refills | Status: DC | PRN

## 2021-05-26 NOTE — Patient Instructions (Signed)
Medication Instructions:  TAKE FUROSEMIDE 20MG  AS NEEDED FOR WEIGHT GAIN 2lbs/DAY -OR- 5lbs/WEEKLY  SILDENAFIL AS NEEDED-REFILL WITH PCP  *If you need a refill on your cardiac medications before your next appointment, please call your pharmacy*  Lab Work: BMET TODAY If you have labs (blood work) drawn today and your tests are completely normal, you will receive your results only by:  MyChart Message (if you have MyChart) OR A paper copy in the mail.  If you have any lab test that is abnormal or we need to change your treatment, we will call you to review the results. You may go to any [facility] that is convenient for you however, we do have a lab in our office that is able to assist you. You DO NOT need an appointment for our lab. The lab is open 8:00am and closes at 4:00pm. Lunch 12:45 - 1:45pm.  Testing/Procedures: Echocardiogram - Your physician has requested that you have an echocardiogram. Echocardiography is a painless test that uses sound waves to create images of your heart. It provides your doctor with information about the size and shape of your heart and how well your hearts chambers and valves are working. This procedure takes approximately one hour. There are no restrictions for this procedure. This will be performed at either our [facility] location - [address], [address].  -or- [facility] location [address] 2nd floor.  Special Instructions TAKE AND LOG YOUR WEIGHT DAILY  PLEASE MAINTAIN PHYSICAL ACTIVITY AS TOLERATED  MAINTAIN DIET  Follow-Up: Your next appointment:  09-27-2021 at  920AM  In Person with 09-29-2021, MD   At [facility], you and your health needs are our priority.  As part of our continuing mission to provide you with exceptional heart care, we have created designated Provider Care Teams.  These Care Teams include your primary Cardiologist (physician) and Advanced Practice Providers (APPs -  Physician Assistants and Nurse Practitioners) who  all work together to provide you with the care you need, when you need it.  We recommend signing up for the patient portal called "MyChart".  Sign up information is provided on this After Visit Summary.  MyChart is used to connect with patients for Virtual Visits (Telemedicine).  Patients are able to view lab/test results, encounter notes, upcoming appointments, etc.  Non-urgent messages can be sent to your provider as well.   To learn more about what you can do with MyChart, go to [facility].

## 2021-05-27 LAB — BASIC METABOLIC PANEL
BUN/Creatinine Ratio: 21 (ref 10–24)
BUN: 30 mg/dL — ABNORMAL HIGH (ref 8–27)
CO2: 24 mmol/L (ref 20–29)
Calcium: 9.5 mg/dL (ref 8.6–10.2)
Chloride: 103 mmol/L (ref 96–106)
Creatinine, Ser: 1.45 mg/dL — ABNORMAL HIGH (ref 0.76–1.27)
Glucose: 79 mg/dL (ref 70–99)
Potassium: 5.2 mmol/L (ref 3.5–5.2)
Sodium: 142 mmol/L (ref 134–144)
eGFR: 51 mL/min/{1.73_m2} — ABNORMAL LOW (ref >59)

## 2021-06-01 ENCOUNTER — Other Ambulatory Visit: Payer: Self-pay | Admitting: Physician Assistant

## 2021-06-01 ENCOUNTER — Other Ambulatory Visit: Payer: Self-pay | Admitting: General Practice

## 2021-06-02 ENCOUNTER — Telehealth: Payer: Self-pay | Admitting: Cardiovascular Disease

## 2021-06-02 MED ORDER — CARVEDILOL 6.25 MG PO TABS: 6.2500 mg | ORAL_TABLET | Freq: Two times a day (BID) | ORAL | 1 refills | Status: DC

## 2021-06-02 NOTE — Telephone Encounter (Signed)
°*  STAT* If patient is at the pharmacy, call can be transferred to refill team.   1. Which medications need to be refilled? (please list name of each medication and dose if known)   carvedilol (COREG) 6.25 MG tablet    2. Which pharmacy/location (including street and city if local pharmacy) is medication to be sent to? [facility] - [place], [place] - [address]  3. Do they need a 30 day or 90 day supply?  90 day  

## 2021-06-02 NOTE — Telephone Encounter (Signed)
Refill sent to pharmacy.   

## 2021-06-06 ENCOUNTER — Other Ambulatory Visit: Payer: Self-pay

## 2021-06-06 ENCOUNTER — Ambulatory Visit (HOSPITAL_COMMUNITY): Payer: Medicare HMO | Attending: Cardiology

## 2021-06-06 DIAGNOSIS — I255 Ischemic cardiomyopathy: Secondary | ICD-10-CM | POA: Diagnosis not present

## 2021-06-06 DIAGNOSIS — Z79899 Other long term (current) drug therapy: Secondary | ICD-10-CM

## 2021-06-06 DIAGNOSIS — I5021 Acute systolic (congestive) heart failure: Secondary | ICD-10-CM

## 2021-06-06 LAB — ECHOCARDIOGRAM COMPLETE
MV M vel: 4.45 m/s
MV Peak grad: 79.2 mmHg
S' Lateral: 4.9 cm

## 2021-06-06 MED ORDER — DAPAGLIFLOZIN PROPANEDIOL 10 MG PO TABS: 10.0000 mg | ORAL_TABLET | Freq: Every day | ORAL | 6 refills | Status: DC

## 2021-06-07 ENCOUNTER — Telehealth: Payer: Self-pay | Admitting: General Practice

## 2021-06-07 NOTE — Telephone Encounter (Signed)
Follow Up:      Patient would like for the nurse to call him and go over his Echo results again please.

## 2021-06-07 NOTE — Telephone Encounter (Signed)
Returned call to pt and went over ECHO results again and informed pt to start [place] today and have lab done again next week for kidney function on the Farxiga. Verbalized understanding.

## 2021-06-15 ENCOUNTER — Other Ambulatory Visit: Payer: Self-pay

## 2021-06-15 ENCOUNTER — Other Ambulatory Visit: Payer: Medicare HMO

## 2021-06-15 DIAGNOSIS — Z79899 Other long term (current) drug therapy: Secondary | ICD-10-CM | POA: Diagnosis not present

## 2021-06-15 DIAGNOSIS — I5021 Acute systolic (congestive) heart failure: Secondary | ICD-10-CM | POA: Diagnosis not present

## 2021-06-15 LAB — BASIC METABOLIC PANEL
BUN/Creatinine Ratio: 20 (ref 10–24)
BUN: 31 mg/dL — ABNORMAL HIGH (ref 8–27)
CO2: 23 mmol/L (ref 20–29)
Calcium: 9.1 mg/dL (ref 8.6–10.2)
Chloride: 105 mmol/L (ref 96–106)
Creatinine, Ser: 1.58 mg/dL — ABNORMAL HIGH (ref 0.76–1.27)
Glucose: 71 mg/dL (ref 70–99)
Potassium: 4.7 mmol/L (ref 3.5–5.2)
Sodium: 141 mmol/L (ref 134–144)
eGFR: 46 mL/min/{1.73_m2} — ABNORMAL LOW (ref >59)

## 2021-07-31 ENCOUNTER — Other Ambulatory Visit: Payer: Self-pay | Admitting: Thoracic Surgery (Cardiothoracic Vascular Surgery)

## 2021-08-11 DIAGNOSIS — I10 Essential (primary) hypertension: Secondary | ICD-10-CM | POA: Diagnosis not present

## 2021-08-11 DIAGNOSIS — N401 Enlarged prostate with lower urinary tract symptoms: Secondary | ICD-10-CM | POA: Diagnosis not present

## 2021-08-11 DIAGNOSIS — E785 Hyperlipidemia, unspecified: Secondary | ICD-10-CM | POA: Diagnosis not present

## 2021-08-11 DIAGNOSIS — G8929 Other chronic pain: Secondary | ICD-10-CM | POA: Diagnosis not present

## 2021-08-25 ENCOUNTER — Ambulatory Visit: Payer: Medicare HMO | Admitting: Physician Assistant

## 2021-08-25 ENCOUNTER — Encounter: Payer: Self-pay | Admitting: Physician Assistant

## 2021-08-25 VITALS — BP 108/74 | HR 103 | Ht 75.0 in | Wt 219.0 lb

## 2021-08-25 DIAGNOSIS — Z79899 Other long term (current) drug therapy: Secondary | ICD-10-CM

## 2021-08-25 DIAGNOSIS — I10 Essential (primary) hypertension: Secondary | ICD-10-CM | POA: Diagnosis not present

## 2021-08-25 DIAGNOSIS — I5021 Acute systolic (congestive) heart failure: Secondary | ICD-10-CM | POA: Diagnosis not present

## 2021-08-25 DIAGNOSIS — M79606 Pain in leg, unspecified: Secondary | ICD-10-CM | POA: Diagnosis not present

## 2021-08-25 DIAGNOSIS — I25810 Atherosclerosis of coronary artery bypass graft(s) without angina pectoris: Secondary | ICD-10-CM | POA: Diagnosis not present

## 2021-08-25 DIAGNOSIS — E785 Hyperlipidemia, unspecified: Secondary | ICD-10-CM | POA: Diagnosis not present

## 2021-08-25 MED ORDER — ENTRESTO 24-26 MG PO TABS
1.0000 | ORAL_TABLET | Freq: Two times a day (BID) | ORAL | 3 refills | Status: DC
Start: 2021-08-25 — End: 2021-09-21

## 2021-08-25 NOTE — Patient Instructions (Signed)
Medication Instructions:  ?STOP Losartan  ?START Entresto 24-26 mg 2 times a day  ?TAKE Lasix 20 mg daily for 5 days then resume as needed going forward  ?*If you need a refill on your cardiac medications before your next appointment, please call your pharmacy* ? ?Lab Work: ?Your physician recommends that you return for lab work TODAY:  ?BMET ?BNP ?CBC ? ?Your physician recommends that you return for lab work in 1 week:  ?BMP ?BNP ? ?If you have labs (blood work) drawn today and your tests are completely normal, you will receive your results only by: ?MyChart Message (if you have MyChart) OR ?A paper copy in the mail ?If you have any lab test that is abnormal or we need to change your treatment, we will call you to review the results. ? ?Testing/Procedures: ?Your physician has requested that you have an ankle brachial index (ABI). During this test an ultrasound and blood pressure cuff are used to evaluate the arteries that supply the arms and legs with blood. Allow thirty minutes for this exam. There are no restrictions or special instructions. ? ?Follow-Up: ?At [facility], you and your health needs are our priority.  As part of our continuing mission to provide you with exceptional heart care, we have created designated Provider Care Teams.  These Care Teams include your primary Cardiologist (physician) and Advanced Practice Providers (APPs -  Physician Assistants and Nurse Practitioners) who all work together to provide you with the care you need, when you need it. ? ?Your next appointment:   ?As previously scheduled-09/27/21 @ 9:20 AM   ? ?The format for your next appointment:   ?In Person ? ?Provider:   ?[name], MD   ? ? ?Other Instructions ? ? ?Important Information About Sugar ? ? ? ? ? ? ?

## 2021-08-25 NOTE — Progress Notes (Signed)
?Cardiology Office Note:   ? ?Date:  08/27/2021  ? ?ID:  Timothy Munoz, DOB May 14, 1946, MRN [id number] ? ?PCP:  Orpah Melter, MD ?  ?[facility] Providers ?Cardiologist:  Shelva Majestic, MD    ? ?Referring MD: Orpah Melter, MD  ? ?Chief Complaint  ?Patient presents with  ? Follow-up  ?  Seen for Dr. Claiborne Billings  ? ? ?History of Present Illness:   ? ?Timothy Munoz is a 76 y.o. male with a hx of CAD s/p CABG x 3 on 01/20/2021, hypertension, hyperlipidemia and obesity.  Patient had anterolateral STEMI with balloon angioplasty of ostial LAD.  Hospital course was complicated by cardiogenic shock.  EF 35%.  He required intra-aortic balloon pump for support.  Patient ultimately underwent CABG x3 on 01/20/2021.  He did have postop atrial fibrillation and was placed on amiodarone and the digoxin.  Echocardiogram obtained on 01/25/2021 showed EF 40 to 45%, grade 2 DD, normal RV, no significant valve issue.  On follow-up, he was volume overloaded and was treated with diuretic therapy.  Patient was last seen by Coletta Memos on 05/26/2021, his Lasix has been changed to as needed.  Amiodarone was stopped.  He was given a prescription for sildenafil 20 mg.  Repeat echocardiogram obtained on 06/06/2021 demonstrated EF 35 to 40%, grade 2 DD, moderate elevated pulmonary artery systolic pressure with RVSP 53.4 mmHg, biatrial moderate enlargement, moderate to severe MR.  He was started on Farxiga, Farxiga dose was reduced by half due to elevation of the creatinine. ? ?Patient presents today for follow-up.  For the past week, he has been having worsening dyspnea on exertion.  He says he can barely walk to his mailbox before he has to stop.  After walking a certain distance, he will develop leg pain.  I will plan to obtain ABI.  He also complained of worsening dyspnea on exertion.  He appears to be mildly volume overloaded on physical exam.  Recent echocardiogram showed moderate to severe MR.  I will stop his losartan and switch him to  Entresto 24-26 mg twice a day.  I will obtain blood work today including basic metabolic panel, CBC and BNP.  He will return in 1 week to repeat basic metabolic panel and a BNP.  I recommend that he continue taking the Lasix daily for the next 5 days before changed back to as needed.  She can follow-up with Dr. Claiborne Billings as usual on May 17. ? ?Past Medical History:  ?Diagnosis Date  ? Hyperlipidemia   ? mild   ? Hypertension   ? Knee pain   ? Obesity   ? Tremor   ? 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 ? ?Past Surgical History:  ?Procedure Laterality Date  ? APPENDECTOMY    ? BACK SURGERY    ? CORONARY ARTERY BYPASS GRAFT N/A 01/20/2021  ? Procedure: CORONARY ARTERY BYPASS GRAFTING (CABG) X 3 ON PUMP, USING LEFT INTERNAL MAMMARY ARTERY AND LEFT ENDOSCOPIC GREATER SAPHENOUS VEIN HARVEST CONDUITS;  Surgeon: Melrose Nakayama, MD;  Location: [place];  Service: Open Heart Surgery;  Laterality: N/A;  ? CORONARY/GRAFT ACUTE MI REVASCULARIZATION N/A 01/16/2021  ? Procedure: Coronary/Graft Acute MI Revascularization;  Surgeon: Troy Sine, MD;  Location: [place] [facility];  Service: Cardiovascular;  Laterality: N/A;  ? ENDOVEIN HARVEST OF GREATER SAPHENOUS VEIN Left 01/20/2021  ? Procedure: ENDOVEIN HARVEST OF GREATER SAPHENOUS VEIN;  Surgeon: Melrose Nakayama, MD;  Location: [place];  Service: Open Heart Surgery;  Laterality: Left;  ?  HEMORRHOID SURGERY    ? IABP INSERTION N/A 01/16/2021  ? Procedure: IABP Insertion;  Surgeon: Troy Sine, MD;  Location: [place] [facility];  Service: Cardiovascular;  Laterality: N/A;  ? LEFT HEART CATH AND CORONARY ANGIOGRAPHY N/A 01/16/2021  ? Procedure: LEFT HEART CATH AND CORONARY ANGIOGRAPHY;  Surgeon: Troy Sine, MD;  Location: [place] [facility];  Service: Cardiovascular;  Laterality: N/A;  ? TEE WITHOUT CARDIOVERSION N/A 01/20/2021  ? Procedure: TRANSESOPHAGEAL ECHOCARDIOGRAM (TEE);  Surgeon: Melrose Nakayama, MD;  Location: [place];  Service: Open Heart Surgery;  Laterality: N/A;   ? ? ?Current Medications: ?Current Meds  ?Medication Sig  ? aspirin EC 81 MG tablet Take 1 tablet (81 mg total) by mouth daily. Swallow whole.  ? atorvastatin (LIPITOR) 80 MG tablet Take 1 tablet (80 mg total) by mouth daily.  ? carvedilol (COREG) 6.25 MG tablet Take 1 tablet (6.25 mg total) by mouth 2 (two) times daily with a meal.  ? clopidogrel (PLAVIX) 75 MG tablet Take 1 tablet (75 mg total) by mouth daily.  ? dapagliflozin propanediol (FARXIGA) 10 MG TABS tablet Take 1 tablet (10 mg total) by mouth daily before breakfast. (Patient taking differently: Take 5 mg by mouth daily before breakfast. Take half tablet aily)  ? furosemide (LASIX) 20 MG tablet Take 1 tablet (20 mg total) by mouth daily as needed for edema.  ? primidone (MYSOLINE) 50 MG tablet Take 1-2 tablets daily for tremor  ? sacubitril-valsartan (ENTRESTO) 24-26 MG Take 1 tablet by mouth 2 (two) times daily.  ? sildenafil (VIAGRA) 25 MG tablet Take 1 tablet (25 mg total) by mouth daily as needed for erectile dysfunction (REFILLS WITH PCP PNLY).  ? traMADol (ULTRAM) 50 MG tablet Take by mouth every 6 (six) hours as needed.  ?  ? ?Allergies:   Patient has no known allergies.  ? ?Social History  ? ?Socioeconomic History  ? Marital status: Single  ?  Spouse name: Not on file  ? Number of children: Not on file  ? Years of education: Not on file  ? Highest education level: Not on file  ?Occupational History  ? 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 Smoking status: Former  ? Smokeless tobacco: Current  ?  Types: Chew  ?Substance and Sexual Activity  ? Alcohol use: Yes  ?  Comment: one beer every two months  ? Drug use: No  ? Sexual activity: Not on file  ?Other Topics Concern  ? Not on file  ?Social History Narrative  ? Right handed   ? ?Social Determinants of Health  ? ?Financial Resource Strain: Not on file  ?Food Insecurity: Not on file  ?Transportation Needs: Not on file  ?Physical Activity: Not on file  ?Stress: Not on file   ?Social Connections: Not on file  ?  ? ?Family History: ?The patient's family history includes Alzheimer's disease in his mother; Hypertension in his father and mother; Parkinson's disease in his mother; Suicidality in his father. ? ?ROS:   ?Please see the history of present illness.    ? All other systems reviewed and are negative. ? ?EKGs/Labs/Other Studies Reviewed:   ? ?The following studies were reviewed today: ? ?Echo 06/06/2021 ? 1. Left ventricular ejection fraction, by estimation, is 35 to 40%. The  ?left ventricle has moderately decreased function. The left ventricle has  ?no regional wall motion abnormalities. Left ventricular diastolic  ?parameters are consistent with Grade II  ?diastolic  dysfunction (pseudonormalization).  ? 2. Right ventricular systolic function is normal. The right ventricular  ?size is mildly enlarged. There is moderately elevated pulmonary artery  ?systolic pressure. The estimated right ventricular systolic pressure is  ?[id number] mmHg.  ? 3. Left atrial size was moderately dilated.  ? 4. Right atrial size was moderately dilated.  ? 5. The mitral valve is normal in structure. Moderate to severe mitral  ?valve regurgitation. No evidence of mitral stenosis.  ? 6. The aortic valve is tricuspid. There is mild calcification of the  ?aortic valve. Aortic valve regurgitation is not visualized. Aortic valve  ?sclerosis is present, with no evidence of aortic valve stenosis.  ? 7. Aortic dilatation noted. 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  ? 8. The inferior vena cava is normal in size with greater than 50%  ?respiratory variability, suggesting right atrial pressure of 3 mmHg.  ? ?Comparison(s): Prior images reviewed side by side. No MR reported on prior  ?echo.  ? ?EKG:  EKG is not ordered today.  The ekg ordered today demonstrates normal sinus rhythm, Q waves in the septal lead, T wave inversion in the inferior lead. ? ?Recent Labs: ?01/16/2021: ALT 86; TSH  1.959 ?01/21/2021: Magnesium 2.5 ?08/25/2021: BNP 436.3; BUN 39; Creatinine, Ser 1.43; Hemoglobin 10.0; Platelets 390; Potassium 5.3; Sodium 139  ?Recent Lipid Panel ?   ?Component Value Date/Time  ? CHOL 163 09/05

## 2021-08-26 LAB — CBC
Hematocrit: 31.1 % — ABNORMAL LOW (ref 37.5–51.0)
Hemoglobin: 10 g/dL — ABNORMAL LOW (ref 13.0–17.7)
MCH: 25.7 pg — ABNORMAL LOW (ref 26.6–33.0)
MCHC: 32.2 g/dL (ref 31.5–35.7)
MCV: 80 fL (ref 79–97)
Platelets: 390 10*3/uL (ref 150–450)
RBC: 3.89 x10E6/uL — ABNORMAL LOW (ref 4.14–5.80)
RDW: 15.8 % — ABNORMAL HIGH (ref 11.6–15.4)
WBC: 8.1 10*3/uL (ref 3.4–10.8)

## 2021-08-26 LAB — BRAIN NATRIURETIC PEPTIDE: BNP: 436.3 pg/mL — ABNORMAL HIGH (ref 0.0–100.0)

## 2021-08-26 LAB — BASIC METABOLIC PANEL
BUN/Creatinine Ratio: 27 — ABNORMAL HIGH (ref 10–24)
BUN: 39 mg/dL — ABNORMAL HIGH (ref 8–27)
CO2: 23 mmol/L (ref 20–29)
Calcium: 9.3 mg/dL (ref 8.6–10.2)
Chloride: 103 mmol/L (ref 96–106)
Creatinine, Ser: 1.43 mg/dL — ABNORMAL HIGH (ref 0.76–1.27)
Glucose: 92 mg/dL (ref 70–99)
Potassium: 5.3 mmol/L — ABNORMAL HIGH (ref 3.5–5.2)
Sodium: 139 mmol/L (ref 134–144)
eGFR: 51 mL/min/{1.73_m2} — ABNORMAL LOW (ref >59)

## 2021-08-27 ENCOUNTER — Encounter: Payer: Self-pay | Admitting: Physician Assistant

## 2021-08-29 ENCOUNTER — Other Ambulatory Visit: Payer: Self-pay

## 2021-08-29 ENCOUNTER — Telehealth: Payer: Self-pay | Admitting: Physician Assistant

## 2021-08-29 DIAGNOSIS — I5021 Acute systolic (congestive) heart failure: Secondary | ICD-10-CM

## 2021-08-29 NOTE — Telephone Encounter (Signed)
Attempted to call patient. Unable to leave voicemail.  

## 2021-08-29 NOTE — Telephone Encounter (Addendum)
Patient informed of the following orders from [facility], [place].Marland KitchenMarland Kitchen"Hold lasix. If BP continue to drop down to the 80s despite holding lasix, then reduce carvedilol to 3.125mg  BID (essentially 1/2 tablet twice a day). Make sure he has stopped losartan. Plan for BMP and BNP blood work this Friday is still unchanged." ?Patient repeated instructions in his own words. Recommended that he send in BP reading to our clinic tomorrow. Losartan removed from medication list. ?

## 2021-08-29 NOTE — Telephone Encounter (Signed)
Pt returning nurse's call. Please advise

## 2021-08-29 NOTE — Telephone Encounter (Signed)
Pt c/o BP issue: STAT if pt c/o blurred vision, one-sided weakness or slurred speech ? ?1. What are your last 5 BP readings? 80/71 ? ?2. Are you having any other symptoms (ex. Dizziness, headache, blurred vision, passed out)? No ? ?3. What is your BP issue? Pt states that he was told to call if top number of BP got below 95. Please advise ? ? ? ? ?

## 2021-08-29 NOTE — Telephone Encounter (Signed)
Hold lasix. If BP continue to drop down to the 80s despite holding lasix, then reduce carvedilol to 3.125mg  BID (essentially 1/2 tablet twice a day). Make sure he has stopped losartan, for some reason, losartan is still on his med list. Plan for BMP and BNP blood work this Friday is still unchanged.  ?

## 2021-08-29 NOTE — Telephone Encounter (Signed)
Patient reports BP 80/71 to 102/76. He denies dizziness or lightheadedness. His SOB has not increased. Please advise. ?

## 2021-08-30 NOTE — Progress Notes (Addendum)
? ? ?Patient: Timothy Munoz ?Date of Birth: 1945-12-26 ? ?Reason for Visit: follow up for tremor ?History From: patient ?Primary Neurologist: Dr. Frances FurbishAthar ? ?HISTORY OF PRESENT ILLNESS: ?Today 08/31/21 ?Timothy Munoz here today for follow-up.  Primidone level was 1.7 at last visit in October 2022. Seeing cardiology closely, BP up and down, just started Entresto, feeling real good today. Stopped losartan. Feels more energetic and stronger. Tremor right hand> left with action, affects eating at times, has not given up anything because of tremor.. Taking primidone 100 mg daily. No side effect, higher dose helps, knows nothing will take away completely. Is worse some days than others. Handwriting sample shows mild to moderate tremor translation. Has lost almost 30 lbs since October 2022, has changed eating habits.  ? ?02/28/21 ALL:  ?Timothy Munoz is a 76 y.o. male here today for follow up for tremor. He was last seen by Timothy EgeSarah Xena Propst, NP in 08/2020. Propranolol was discontinued due to low heart rate. Primidone 50mg  daily at bedtime started. He has noticed improvement of tremor since. He is tolerating medication well with no obvious adverse effects. He is requesting to increase dose on days when tremor is worse.  ?  ?He was admitted to the ER in 01/2021 for chest pain. Heart cath not successful. 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 He is recovering fairly well. He returns to see Dr Dorris FetchHendrickson on 10/25. He also has follow up with Edd FabianJesse Cleaver, NP [facility]/16/20[facility]. ? ?Update 08/29/2020 SS: Timothy Munoz is a 76 year old male with history of tremor affecting both upper extremities.  His mother had Alzheimer's disease and Parkinson's disease with tremor.  His tremor worsens over time, has tremor with action, most problematic with handwriting, eating or drinking.  Tremor is greater in the right hand than the left.  When taking Xanax twice daily, resulted in drowsiness.  Currently on propanolol 20 mg in AM, thinks it is  somewhat helpful, but heart rate is 51.  He lives alone, remains active.  Is retired.  Is due to see his primary care doctor.  Has had blood work done recently, reports was normal.  Here today for evaluation unaccompanied. ? ?Update 02/29/2020 SS: Timothy Munoz is a 76 year old male with history of tremor affecting both upper extremities.  Reportedly his mother had Alzheimer's disease, and Parkinson's disease with tremor.  He takes Xanax as needed for tremor, never more than once a day, but he thinks he helps when he takes it.  The tremor is gradually progressing over time.  There are times he has significant difficulty with eating, stopped for spaghetti recently, had terrible time.  Sometimes holding the steering well with his right hand, will notice tremoring, or holding something with a tight grip, affects handwriting.  Tremor more in the right hand than left.  No falls, balance is okay.  He is retired, but remains active.  His overall health is good.  Presents today for evaluation unaccompanied. ? ?HISTORY ?03/04/2019 Dr. Anne HahnWillis: Timothy Munoz is a 76 year old right-handed white male with a history of a tremor affecting both upper extremities for greater than 17 years.  The patient claims that his mother had Alzheimer's disease but she also had tremor but was told she had Parkinson's disease.  He knows of no other family members who have had tremor, he has 1 sister.  The patient indicates that over time, the tremor has gradually worsened.  He is having increasing problems with handwriting and with feeding himself, he likes  to do a lot of fishing but has trouble tying the knots on the fishing line.  The patient does not have any tremor when resting the arms.  He reports no tremors associated with the head or neck or with the voice.  He reports some mild gait instability problems but no problems with falling.  He does have urinary frequency but no incontinence.  He reports no significant numbness or weakness of the  extremities.  He comes to this office for an evaluation.  ? ?REVIEW OF SYSTEMS: Out of a complete 14 system review of symptoms, the patient complains only of the following symptoms, and all other reviewed systems are negative. ? ?See HPI ? ?ALLERGIES: ?No Known Allergies ? ?HOME MEDICATIONS: ?Outpatient Medications Prior to Visit  ?Medication Sig Dispense Refill  ? aspirin EC 81 MG tablet Take 1 tablet (81 mg total) by mouth daily. Swallow whole. 150 tablet 2  ? atorvastatin (LIPITOR) 80 MG tablet Take 1 tablet (80 mg total) by mouth daily. 30 tablet 6  ? carvedilol (COREG) 6.25 MG tablet Take 1 tablet (6.25 mg total) by mouth 2 (two) times daily with a meal. 180 tablet 1  ? clopidogrel (PLAVIX) 75 MG tablet Take 1 tablet (75 mg total) by mouth daily. 30 tablet 11  ? dapagliflozin propanediol (FARXIGA) 10 MG TABS tablet Take 1 tablet (10 mg total) by mouth daily before breakfast. (Patient taking differently: Take 5 mg by mouth daily before breakfast. Take half tablet aily) 30 tablet 6  ? sacubitril-valsartan (ENTRESTO) 24-26 MG Take 1 tablet by mouth 2 (two) times daily. 60 tablet 3  ? sildenafil (VIAGRA) 25 MG tablet Take 1 tablet (25 mg total) by mouth daily as needed for erectile dysfunction (REFILLS WITH PCP PNLY). 10 tablet 0  ? traMADol (ULTRAM) 50 MG tablet Take by mouth every 6 (six) hours as needed.    ? primidone (MYSOLINE) 50 MG tablet Take 1-2 tablets daily for tremor 180 tablet 1  ? furosemide (LASIX) 20 MG tablet Take 1 tablet (20 mg total) by mouth daily as needed for edema. 30 tablet 6  ? ?No facility-administered medications prior to visit.  ? ? ?PAST MEDICAL HISTORY: ?Past Medical History:  ?Diagnosis Date  ? Hyperlipidemia   ? mild   ? Hypertension   ? Knee pain   ? Obesity   ? Tremor   ? 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 ? ?PAST SURGICAL HISTORY: ?Past Surgical History:  ?Procedure Laterality Date  ? APPENDECTOMY    ? BACK SURGERY    ? CORONARY ARTERY BYPASS GRAFT N/A 01/20/2021  ? Procedure:  CORONARY ARTERY BYPASS GRAFTING (CABG) X 3 ON PUMP, USING LEFT INTERNAL MAMMARY ARTERY AND LEFT ENDOSCOPIC GREATER SAPHENOUS VEIN HARVEST CONDUITS;  Surgeon: [name], MD;  Location: [facility];  Service: Open Heart Surgery;  Laterality: N/A;  ? CORONARY/GRAFT ACUTE MI REVASCULARIZATION N/A 01/16/2021  ? Procedure: Coronary/Graft Acute MI Revascularization;  Surgeon: Lennette Bihari, MD;  [facility]: [facility];  Service: Cardiovascular;  Laterality: N/A;  ? ENDOVEIN HARVEST OF GREATER SAPHENOUS VEIN Left 01/20/2021  ? Procedure: ENDOVEIN HARVEST OF GREATER SAPHENOUS VEIN;  Surgeon: [name], MD;  Location: [facility];  Service: Open Heart Surgery;  Laterality: Left;  ? HEMORRHOID SURGERY    ? IABP INSERTION N/A 01/16/2021  ? Procedure: IABP Insertion;  Surgeon: Lennette Bihari, MD;  Location: [facility];  Service: Cardiovascular;  Laterality: N/A;  ? LEFT HEART CATH AND CORONARY ANGIOGRAPHY  N/A 01/16/2021  ? Procedure: LEFT HEART CATH AND CORONARY ANGIOGRAPHY;  Surgeon: Lennette Bihari, MD;  Location: [facility];  Service: Cardiovascular;  Laterality: N/A;  ? TEE WITHOUT CARDIOVERSION N/A 01/20/2021  ? Procedure: TRANSESOPHAGEAL ECHOCARDIOGRAM (TEE);  Surgeon: [name], MD;  Location: [facility] OR;  Service: Open Heart Surgery;  Laterality: N/A;  ? ? ?FAMILY HISTORY: ?Family History  ?Problem Relation Age of Onset  ? Hypertension Mother   ? Parkinson's disease Mother   ? Alzheimer's disease Mother   ? Hypertension Father   ? Suicidality Father   ? ? ?SOCIAL HISTORY: ?Social History  ? ?Socioeconomic History  ? Marital status: Single  ?  Spouse name: Not on file  ? Number of children: Not on file  ? Years of education: Not on file  ? Highest education level: Not on file  ?Occupational History  ? 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 Smoking status: Former  ? Smokeless tobacco: Current  ?  Types: Chew  ?Substance and Sexual Activity  ? Alcohol use: Yes  ?   Comment: one beer every two months  ? Drug use: No  ? Sexual activity: Not on file  ?Other Topics Concern  ? Not on file  ?Social History Narrative  ? Right handed   ? ?Social Determinants of Health  ? ?Merri Brunette

## 2021-08-31 ENCOUNTER — Ambulatory Visit: Payer: Medicare HMO | Admitting: Neurology

## 2021-08-31 ENCOUNTER — Encounter: Payer: Self-pay | Admitting: Neurology

## 2021-08-31 VITALS — BP 117/84 | HR 100 | Ht 75.0 in | Wt 215.0 lb

## 2021-08-31 DIAGNOSIS — G250 Essential tremor: Secondary | ICD-10-CM

## 2021-08-31 MED ORDER — PRIMIDONE 50 MG PO TABS: ORAL_TABLET | ORAL | 3 refills | Status: DC

## 2021-08-31 NOTE — Patient Instructions (Signed)
You look good today  ?Continue primidone at current dose ?Check primidone level today  ?Call for any issues, will see you back in 1 year  ?

## 2021-09-01 DIAGNOSIS — I5021 Acute systolic (congestive) heart failure: Secondary | ICD-10-CM | POA: Diagnosis not present

## 2021-09-01 LAB — PRIMIDONE, SERUM
Phenobarbital, Serum: NOT DETECTED ug/mL (ref 15–40)
Primidone Lvl: 4.1 ug/mL — ABNORMAL LOW (ref 5.0–12.0)

## 2021-09-02 LAB — BASIC METABOLIC PANEL
BUN/Creatinine Ratio: 24 (ref 10–24)
BUN: 32 mg/dL — ABNORMAL HIGH (ref 8–27)
CO2: 24 mmol/L (ref 20–29)
Calcium: 9.1 mg/dL (ref 8.6–10.2)
Chloride: 105 mmol/L (ref 96–106)
Creatinine, Ser: 1.34 mg/dL — ABNORMAL HIGH (ref 0.76–1.27)
Glucose: 106 mg/dL — ABNORMAL HIGH (ref 70–99)
Potassium: 5.2 mmol/L (ref 3.5–5.2)
Sodium: 141 mmol/L (ref 134–144)
eGFR: 55 mL/min/{1.73_m2} — ABNORMAL LOW (ref >59)

## 2021-09-02 LAB — BRAIN NATRIURETIC PEPTIDE: BNP: 719.7 pg/mL — ABNORMAL HIGH (ref 0.0–100.0)

## 2021-09-07 ENCOUNTER — Other Ambulatory Visit: Payer: Self-pay

## 2021-09-07 DIAGNOSIS — I5021 Acute systolic (congestive) heart failure: Secondary | ICD-10-CM

## 2021-09-07 MED ORDER — FUROSEMIDE 20 MG PO TABS: 20.0000 mg | ORAL_TABLET | Freq: Every day | ORAL | 3 refills | Status: DC

## 2021-09-07 NOTE — Progress Notes (Signed)
Stable renal function and electrolyte. However BNP increased suggesting volume accumulation, recommend resume lasix at 20mg  daily. Repeat BMET and BNP in 2 weeks

## 2021-09-12 ENCOUNTER — Inpatient Hospital Stay (HOSPITAL_COMMUNITY): Admission: RE | Admit: 2021-09-12 | Payer: Medicare HMO | Source: Ambulatory Visit

## 2021-09-15 ENCOUNTER — Telehealth: Payer: Self-pay | Admitting: Cardiovascular Disease

## 2021-09-15 NOTE — Telephone Encounter (Signed)
Left message to call back  

## 2021-09-15 NOTE — Telephone Encounter (Signed)
Pt c/o Shortness Of Breath: STAT if SOB developed within the last 24 hours or pt is noticeably SOB on the phone ? ?1. Are you currently SOB (can you hear that pt is SOB on the phone)? no ? ?2. How long have you been experiencing SOB? For a month or two, but it has been getting worse over the past week or two.  ? ?3. Are you SOB when sitting or when up moving around? Up moving around, and sometimes when he moves onto his back when he is sleeping.  ? ?4. Are you currently experiencing any other symptoms? It feels like his ear are clogged up. He doesn't think he has a cold. He can't walk very far he legs get give out on him. He has an appt on May 17th, but he feels he needs to be seen before that.   ?

## 2021-09-17 ENCOUNTER — Inpatient Hospital Stay (HOSPITAL_COMMUNITY)
Admission: EM | Admit: 2021-09-17 | Discharge: 2021-09-21 | DRG: 291 | Disposition: A | Discharge status: Home / Self Care | Payer: Medicare HMO | Attending: Internal Medicine | Admitting: Internal Medicine

## 2021-09-17 ENCOUNTER — Telehealth: Payer: Self-pay | Admitting: Physician Assistant

## 2021-09-17 ENCOUNTER — Other Ambulatory Visit: Payer: Self-pay

## 2021-09-17 ENCOUNTER — Encounter (HOSPITAL_COMMUNITY): Payer: Self-pay

## 2021-09-17 ENCOUNTER — Emergency Department (HOSPITAL_COMMUNITY): Payer: Medicare HMO

## 2021-09-17 DIAGNOSIS — I5043 Acute on chronic combined systolic (congestive) and diastolic (congestive) heart failure: Secondary | ICD-10-CM | POA: Diagnosis present

## 2021-09-17 DIAGNOSIS — I700 Atherosclerosis of aorta: Secondary | ICD-10-CM | POA: Diagnosis not present

## 2021-09-17 DIAGNOSIS — Z7982 Long term (current) use of aspirin: Secondary | ICD-10-CM

## 2021-09-17 DIAGNOSIS — R040 Epistaxis: Secondary | ICD-10-CM | POA: Diagnosis not present

## 2021-09-17 DIAGNOSIS — I252 Old myocardial infarction: Secondary | ICD-10-CM

## 2021-09-17 DIAGNOSIS — Z79899 Other long term (current) drug therapy: Secondary | ICD-10-CM | POA: Diagnosis not present

## 2021-09-17 DIAGNOSIS — I5020 Unspecified systolic (congestive) heart failure: Secondary | ICD-10-CM | POA: Diagnosis not present

## 2021-09-17 DIAGNOSIS — Z951 Presence of aortocoronary bypass graft: Secondary | ICD-10-CM

## 2021-09-17 DIAGNOSIS — I5021 Acute systolic (congestive) heart failure: Secondary | ICD-10-CM | POA: Diagnosis not present

## 2021-09-17 DIAGNOSIS — I509 Heart failure, unspecified: Principal | ICD-10-CM

## 2021-09-17 DIAGNOSIS — E785 Hyperlipidemia, unspecified: Secondary | ICD-10-CM | POA: Diagnosis present

## 2021-09-17 DIAGNOSIS — I5023 Acute on chronic systolic (congestive) heart failure: Secondary | ICD-10-CM | POA: Diagnosis not present

## 2021-09-17 DIAGNOSIS — Z7902 Long term (current) use of antithrombotics/antiplatelets: Secondary | ICD-10-CM

## 2021-09-17 DIAGNOSIS — I2510 Atherosclerotic heart disease of native coronary artery without angina pectoris: Secondary | ICD-10-CM | POA: Diagnosis not present

## 2021-09-17 DIAGNOSIS — I340 Nonrheumatic mitral (valve) insufficiency: Secondary | ICD-10-CM | POA: Diagnosis not present

## 2021-09-17 DIAGNOSIS — I4892 Unspecified atrial flutter: Secondary | ICD-10-CM | POA: Diagnosis not present

## 2021-09-17 DIAGNOSIS — I440 Atrioventricular block, first degree: Secondary | ICD-10-CM | POA: Diagnosis present

## 2021-09-17 DIAGNOSIS — I110 Hypertensive heart disease with heart failure: Secondary | ICD-10-CM | POA: Diagnosis not present

## 2021-09-17 DIAGNOSIS — R0602 Shortness of breath: Secondary | ICD-10-CM | POA: Diagnosis not present

## 2021-09-17 DIAGNOSIS — I484 Atypical atrial flutter: Secondary | ICD-10-CM | POA: Diagnosis not present

## 2021-09-17 DIAGNOSIS — I10 Essential (primary) hypertension: Secondary | ICD-10-CM | POA: Diagnosis present

## 2021-09-17 DIAGNOSIS — Z8249 Family history of ischemic heart disease and other diseases of the circulatory system: Secondary | ICD-10-CM

## 2021-09-17 DIAGNOSIS — F17220 Nicotine dependence, chewing tobacco, uncomplicated: Secondary | ICD-10-CM | POA: Diagnosis present

## 2021-09-17 DIAGNOSIS — I255 Ischemic cardiomyopathy: Secondary | ICD-10-CM | POA: Diagnosis not present

## 2021-09-17 DIAGNOSIS — I4891 Unspecified atrial fibrillation: Secondary | ICD-10-CM | POA: Diagnosis not present

## 2021-09-17 LAB — COMPREHENSIVE METABOLIC PANEL
ALT: 19 U/L (ref 0–44)
AST: 21 U/L (ref 15–41)
Albumin: 3.6 g/dL (ref 3.5–5.0)
Alkaline Phosphatase: 121 U/L (ref 38–126)
Anion gap: 7 (ref 5–15)
BUN: 30 mg/dL — ABNORMAL HIGH (ref 8–23)
CO2: 26 mmol/L (ref 22–32)
Calcium: 9.1 mg/dL (ref 8.9–10.3)
Chloride: 107 mmol/L (ref 98–111)
Creatinine, Ser: 1.42 mg/dL — ABNORMAL HIGH (ref 0.61–1.24)
GFR, Estimated: 52 mL/min — ABNORMAL LOW (ref >60)
Glucose, Bld: 87 mg/dL (ref 70–99)
Potassium: 4.3 mmol/L (ref 3.5–5.1)
Sodium: 140 mmol/L (ref 135–145)
Total Bilirubin: 0.8 mg/dL (ref 0.3–1.2)
Total Protein: 6.7 g/dL (ref 6.5–8.1)

## 2021-09-17 LAB — CBC WITH DIFFERENTIAL/PLATELET
Abs Immature Granulocytes: 0.01 10*3/uL (ref 0.00–0.07)
Basophils Absolute: 0 10*3/uL (ref 0.0–0.1)
Basophils Relative: 1 %
Eosinophils Absolute: 0.2 10*3/uL (ref 0.0–0.5)
Eosinophils Relative: 3 %
HCT: 39.1 % (ref 39.0–52.0)
Hemoglobin: 11.6 g/dL — ABNORMAL LOW (ref 13.0–17.0)
Immature Granulocytes: 0 %
Lymphocytes Relative: 17 %
Lymphs Abs: 1 10*3/uL (ref 0.7–4.0)
MCH: 25.4 pg — ABNORMAL LOW (ref 26.0–34.0)
MCHC: 29.7 g/dL — ABNORMAL LOW (ref 30.0–36.0)
MCV: 85.6 fL (ref 80.0–100.0)
Monocytes Absolute: 0.4 10*3/uL (ref 0.1–1.0)
Monocytes Relative: 8 %
Neutro Abs: 4 10*3/uL (ref 1.7–7.7)
Neutrophils Relative %: 71 %
Platelets: 303 10*3/uL (ref 150–400)
RBC: 4.57 MIL/uL (ref 4.22–5.81)
RDW: 18 % — ABNORMAL HIGH (ref 11.5–15.5)
WBC: 5.6 10*3/uL (ref 4.0–10.5)
nRBC: 0 % (ref 0.0–0.2)

## 2021-09-17 LAB — TSH: TSH: 5.397 u[IU]/mL — ABNORMAL HIGH (ref 0.350–4.500)

## 2021-09-17 LAB — BRAIN NATRIURETIC PEPTIDE
B Natriuretic Peptide: 1214.9 pg/mL — ABNORMAL HIGH (ref 0.0–100.0)
B Natriuretic Peptide: 824.1 pg/mL — ABNORMAL HIGH (ref 0.0–100.0)

## 2021-09-17 LAB — TROPONIN I (HIGH SENSITIVITY)
Troponin I (High Sensitivity): 15 ng/L (ref <18)
Troponin I (High Sensitivity): 13 ng/L (ref <18)

## 2021-09-17 MED ORDER — SODIUM CHLORIDE 0.9% FLUSH
3.0000 mL | Freq: Two times a day (BID) | INTRAVENOUS | Status: DC
  Administered 2021-09-17 – 2021-09-21 (×8): 3 mL via INTRAVENOUS

## 2021-09-17 MED ORDER — FUROSEMIDE 10 MG/ML IJ SOLN
40.0000 mg | Freq: Once | INTRAMUSCULAR | Status: AC
  Administered 2021-09-17: 40 mg via INTRAVENOUS
  Filled 2021-09-17: qty 4

## 2021-09-17 MED ORDER — SODIUM CHLORIDE 0.9% FLUSH: 3.0000 mL | INTRAVENOUS | Status: DC | PRN

## 2021-09-17 MED ORDER — PRIMIDONE 50 MG PO TABS
100.0000 mg | ORAL_TABLET | Freq: Every day | ORAL | Status: DC
  Administered 2021-09-18 – 2021-09-21 (×4): 100 mg via ORAL
  Filled 2021-09-17 (×4): qty 2

## 2021-09-17 MED ORDER — ENOXAPARIN SODIUM 40 MG/0.4ML IJ SOSY
40.0000 mg | PREFILLED_SYRINGE | INTRAMUSCULAR | Status: DC
  Administered 2021-09-17: 40 mg via SUBCUTANEOUS
  Filled 2021-09-17: qty 0.4

## 2021-09-17 MED ORDER — POTASSIUM CHLORIDE CRYS ER 20 MEQ PO TBCR
20.0000 meq | EXTENDED_RELEASE_TABLET | Freq: Every day | ORAL | Status: DC
  Administered 2021-09-17 – 2021-09-21 (×5): 20 meq via ORAL
  Filled 2021-09-17 (×5): qty 1

## 2021-09-17 MED ORDER — ATORVASTATIN CALCIUM 80 MG PO TABS
80.0000 mg | ORAL_TABLET | Freq: Every day | ORAL | Status: DC
  Administered 2021-09-18 – 2021-09-21 (×4): 80 mg via ORAL
  Filled 2021-09-17 (×4): qty 1

## 2021-09-17 MED ORDER — ACETAMINOPHEN 325 MG PO TABS: 650.0000 mg | ORAL_TABLET | ORAL | Status: DC | PRN

## 2021-09-17 MED ORDER — ONDANSETRON HCL 4 MG/2ML IJ SOLN: 4.0000 mg | Freq: Four times a day (QID) | INTRAMUSCULAR | Status: DC | PRN

## 2021-09-17 MED ORDER — FUROSEMIDE 10 MG/ML IJ SOLN
40.0000 mg | Freq: Two times a day (BID) | INTRAMUSCULAR | Status: DC
  Administered 2021-09-17 – 2021-09-20 (×5): 40 mg via INTRAVENOUS
  Filled 2021-09-17 (×6): qty 4

## 2021-09-17 MED ORDER — ASPIRIN EC 81 MG PO TBEC
81.0000 mg | DELAYED_RELEASE_TABLET | Freq: Every day | ORAL | Status: DC
  Administered 2021-09-18 – 2021-09-21 (×4): 81 mg via ORAL
  Filled 2021-09-17 (×4): qty 1

## 2021-09-17 MED ORDER — CARVEDILOL 6.25 MG PO TABS
6.2500 mg | ORAL_TABLET | Freq: Two times a day (BID) | ORAL | Status: DC
  Administered 2021-09-17 – 2021-09-18 (×2): 6.25 mg via ORAL
  Filled 2021-09-17 (×2): qty 1

## 2021-09-17 MED ORDER — SODIUM CHLORIDE 0.9 % IV SOLN: 250.0000 mL | INTRAVENOUS | Status: DC | PRN

## 2021-09-17 MED ORDER — DAPAGLIFLOZIN PROPANEDIOL 5 MG PO TABS
5.0000 mg | ORAL_TABLET | Freq: Every day | ORAL | Status: DC
  Administered 2021-09-18: 5 mg via ORAL
  Filled 2021-09-17: qty 1

## 2021-09-17 MED ORDER — CLOPIDOGREL BISULFATE 75 MG PO TABS
75.0000 mg | ORAL_TABLET | Freq: Every day | ORAL | Status: DC
  Administered 2021-09-18 – 2021-09-21 (×4): 75 mg via ORAL
  Filled 2021-09-17 (×4): qty 1

## 2021-09-17 NOTE — ED Provider Notes (Signed)
? ?Emergency Department Provider Note ? ? ?I have reviewed the triage vital signs and the nursing notes. ? ? ?HISTORY ? ?Chief Complaint ?Shortness of Breath ? ? ?HPI ?Timothy Munoz is a 76 y.o. male with past medical history of CAD and cardiomyopathy (EF 35-40%) presents emergency department with increasing dyspnea, orthopnea, swelling in the legs and weight gain.  Patient is followed by [facility] and compliant with his home diuretic.  He is having increased swelling in the legs, 5 pound weight gain in the past week, and inability to sleep lying flat.  He is feeling increasingly short of breath, now even at rest.  He is having significant dyspnea with exertion.  Reports that he can barely make it to his mailbox and back without needing to take a rest which is unusual for him.  He is not having chest pain/pressure/tightness.  No fevers or chills.  He called the advice line with his cardiology group today and was referred to the emergency department. ? ? ?Past Medical History:  ?Diagnosis Date  ? Hyperlipidemia   ? mild   ? Hypertension   ? Knee pain   ? Obesity   ? Tremor   ? 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 ? ?Review of Systems ? ?Constitutional: No fever/chills ?Cardiovascular: Denies chest pain. Positive LE edema.  ?Respiratory: Positive shortness of breath. ?Gastrointestinal: No abdominal pain.  No nausea, no vomiting.  No diarrhea.  No constipation. ?Genitourinary: Negative for dysuria. ?Musculoskeletal: Negative for back pain. ?Skin: Negative for rash. ?Neurological: Negative for headaches. ? ? ?____________________________________________ ? ? ?PHYSICAL EXAM: ? ?VITAL SIGNS: ?ED Triage Vitals  ?Enc Vitals Group  ?   BP 09/17/21 1006 (!) 118/91  ?   Pulse Rate 09/17/21 1006 (!) 113  ?   Resp 09/17/21 1006 17  ?   Temp 09/17/21 1006 (!) 97.5 ?F (36.4 ?C)  ?   Temp Source 09/17/21 1006 Oral  ?   SpO2 09/17/21 1006 99 %  ?   Weight 09/17/21 1028 220 lb (99.8 kg)  ?   Height 09/17/21 1028 6\' 3"  (1.905 m)   ? ?Constitutional: Alert and oriented. Well appearing and in no acute distress. ?Eyes: Conjunctivae are normal.  ?Head: Atraumatic. ?Nose: No congestion/rhinnorhea. ?Mouth/Throat: Mucous membranes are moist.   ?Neck: No stridor. ?Cardiovascular: Normal rate, regular rhythm. Good peripheral circulation. Grossly normal heart sounds. Positive JVP to the mandible.  ?Respiratory: Normal respiratory effort.  No retractions. Lungs CTAB. ?Gastrointestinal: Soft and nontender. No distention.  ?Musculoskeletal: No lower extremity tenderness. 2+ pitting edema in the bilateral LEs. No gross deformities of extremities. ?Neurologic:  Normal speech and language. No gross focal neurologic deficits are appreciated.  ?Skin:  Skin is warm, dry and intact. No rash noted. ? ? ?____________________________________________ ?  ?LABS ?(all labs ordered are listed, but only abnormal results are displayed) ? ?Labs Reviewed  ?COMPREHENSIVE METABOLIC PANEL - Abnormal; Notable for the following components:  ?    Result Value  ? BUN 30 (*)   ? Creatinine, Ser 1.42 (*)   ? GFR, Estimated 52 (*)   ? All other components within normal limits  ?BRAIN NATRIURETIC PEPTIDE - Abnormal; Notable for the following components:  ? B Natriuretic Peptide 1,214.9 (*)   ? All other components within normal limits  ?CBC WITH DIFFERENTIAL/PLATELET - Abnormal; Notable for the following components:  ? Hemoglobin 11.6 (*)   ? MCH 25.4 (*)   ? MCHC 29.7 (*)   ? RDW 18.0 (*)   ?  All other components within normal limits  ?BRAIN NATRIURETIC PEPTIDE - Abnormal; Notable for the following components:  ? B Natriuretic Peptide 824.1 (*)   ? All other components within normal limits  ?TSH - Abnormal; Notable for the following components:  ? TSH 5.397 (*)   ? All other components within normal limits  ?BASIC METABOLIC PANEL - Abnormal; Notable for the following components:  ? BUN 27 (*)   ? Creatinine, Ser 1.49 (*)   ? Calcium 8.7 (*)   ? GFR, Estimated 49 (*)   ? Anion gap 4  (*)   ? All other components within normal limits  ?BASIC METABOLIC PANEL - Abnormal; Notable for the following components:  ? BUN 32 (*)   ? Creatinine, Ser 1.39 (*)   ? Calcium 8.8 (*)   ? GFR, Estimated 53 (*)   ? All other components within normal limits  ?MAGNESIUM  ?TROPONIN I (HIGH SENSITIVITY)  ?TROPONIN I (HIGH SENSITIVITY)  ? ?____________________________________________ ? ?EKG ? ? EKG Interpretation ? ?Date/Time:  Sunday Sep 17 2021 10:05:11 EDT ?Ventricular Rate:  113 ?PR Interval:    ?QRS Duration: 110 ?QT Interval:  416 ?QTC Calculation: 570 ?R Axis:   -6 ?Text Interpretation: Accelerated Junctional rhythm with retrograde conduction Incomplete right bundle branch block Anteroseptal infarct , age undetermined ST & T wave abnormality, consider inferior ischemia Prolonged QT Abnormal ECG When compared with ECG of 21-Jan-2021 07:29, PREVIOUS ECG IS PRESENT Confirmed by Alecea Trego ([id number]) on 09/17/2021 10:12:41 AM ?  ? ?  ? ? ?____________________________________________ ? ?RADIOLOGY ? ?ECHOCARDIOGRAM LIMITED ? ?Result Date: 09/18/2021 ?   ECHOCARDIOGRAM LIMITED REPORT   Patient Name:   Timothy Munoz Date of Exam: 09/18/2021 Medical Rec #:  [id number]       Height:       75.0 in Accession #:    [id number]      Weight:       210.3 lb Date of Birth:  05/01/1946       BSA:          2.242 m? Patient Age:    76 years        BP:           100/79 mmHg Patient Gender: M               HR:           11 0 bpm. Exam Location:  Inpatient Procedure: Limited Echo, 3D Echo, Color Doppler, Cardiac Doppler and            Intracardiac Opacification Agent Indications:    CHF-Acute Systolic [id number]                 Mitral valve insufficiency I34.0  History:        Patient has prior history of Echocardiogram examinations, most                 recent 06/06/2021. Risk Factors:Hypertension and Dyslipidemia.  Sonographer:    Bernadene Person RDCS Referring Phys: [place]  1. Left ventricular ejection fraction by 3D  volume is 26 %. The left ventricle has severely decreased function. The left ventricle demonstrates global hypokinesis. The left ventricular internal cavity size was moderately dilated.  2. Right ventricular systolic function is normal. The right ventricular size is normal. There is normal pulmonary artery systolic pressure. The estimated right ventricular systolic pressure is 123456 mmHg.  3. PISA radius 0.4 cm. The mitral valve is normal in  structure. Moderate to severe mitral valve regurgitation. No evidence of mitral stenosis.  4. Tricuspid valve regurgitation is moderate.  5. The aortic valve is normal in structure. Aortic valve regurgitation is not visualized. No aortic stenosis is present.  6. Aortic dilatation noted. There is mild dilatation of the aortic root, measuring 43 mm.  7. The inferior vena cava is normal in size with greater than 50% respiratory variability, suggesting right atrial pressure of 3 mmHg. Comparison(s): Prior images reviewed side by side. Mitral regurgitation was previously trivial/ mild. Likely increased from overall reduced EF, dilation. FINDINGS  Left Ventricle: Left ventricular ejection fraction by 3D volume is 26 %. The left ventricle has severely decreased function. The left ventricle demonstrates global hypokinesis. Definity contrast agent was given IV to delineate the left ventricular endocardial borders. The left ventricular internal cavity size was moderately dilated. There is no left ventricular hypertrophy. Right Ventricle: The right ventricular size is normal. No increase in right ventricular wall thickness. Right ventricular systolic function is normal. There is normal pulmonary artery systolic pressure. The tricuspid regurgitant velocity is 2.66 m/s, and  with an assumed right atrial pressure of 3 mmHg, the estimated right ventricular systolic pressure is 123456 mmHg. Left Atrium: Left atrial size was normal in size. Right Atrium: Right atrial size was normal in size.  Pericardium: There is no evidence of pericardial effusion. Mitral Valve: PISA radius 0.4 cm. The mitral valve is normal in structure. There is mild thickening of the mitral valve leaflet(s). There is mild calcification of t

## 2021-09-17 NOTE — Consult Note (Deleted)
?Cardiology Consultation:  ? ?Patient ID: Timothy Munoz ?MRN: [id number]; DOB: 1945/11/06 ? ?Admit date: 09/17/2021 ?Date of Consult: 09/17/2021 ? ?PCP:  Orpah Melter, MD ?  ?[facility] Providers ?Cardiologist:  Shelva Majestic, MD      ? ? ?Patient Profile:  ? ?Timothy Munoz is a 76 y.o. 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 Laverta Baltimore. ? ?History of Present Illness:  ? ?Timothy Munoz is a 76 y.o. male with a hx of CAD s/p CABG x 3 on 01/20/2021, hypertension, hyperlipidemia and obesity.  Patient had anterolateral STEMI with balloon angioplasty of ostial LAD.  Hospital course was complicated by cardiogenic shock.  EF 35%.  He required intra-aortic balloon pump for support.  Patient ultimately underwent CABG x3 on 01/20/2021.  He did have postop atrial fibrillation and was placed on amiodarone and the digoxin.  Echocardiogram obtained on 01/25/2021 showed EF 40 to 45%, grade 2 DD, normal RV, no significant valve issue.  On follow-up, he was volume overloaded and was treated with diuretic therapy.  Patient was last seen by Coletta Memos on 05/26/2021, his Lasix has been changed to as needed.  Amiodarone was stopped.  He was given a prescription for sildenafil 20 mg.  Repeat echocardiogram obtained on 06/06/2021 demonstrated EF 35 to 40%, grade 2 DD, moderate elevated pulmonary artery systolic pressure with RVSP 53.4 mmHg, biatrial moderate enlargement, moderate to severe MR.  He was started on Farxiga, Farxiga dose was reduced by half due to elevation of the creatinine. Saw Hao in follow up in April. He endorsed SOB on exertion. He was start on entresto. He was recommended to take lasix 20 mg daily for 5 days then switch to PRN. He remained SOB. He presents here with dyspnea with mild exertion. About ~5  pound weight gain (99kg; was 117 kg when he left the hospital). +orthopnea an PND. Legs with pitting edema. Notes low appetite and muscle loss.  BNP 1214. Cxray shows mild interstitial edema. Notes some productive cough. No report of fever. He notes he is taking the lasix 20 mg daily. He denies chest pressure. Symptoms are not similar to his MI. He is otherwise stable. Sinus tachycardia. On room air satting high 90s-100 ? ? ?Wt Readings from Last 3 Encounters:  ?09/17/21 99.8 kg  ?08/31/21 97.5 kg  ?08/25/21 99.3 kg  ? ? ? ?Past Medical History:  ?Diagnosis Date  ? Hyperlipidemia   ? mild   ? Hypertension   ? Knee pain   ? Obesity   ? Tremor   ? 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 ? ?Past Surgical History:  ?Procedure Laterality Date  ? APPENDECTOMY    ? BACK SURGERY    ? CORONARY ARTERY BYPASS GRAFT N/A 01/20/2021  ? Procedure: CORONARY ARTERY BYPASS GRAFTING (CABG) X 3 ON PUMP, USING LEFT INTERNAL MAMMARY ARTERY AND LEFT ENDOSCOPIC GREATER SAPHENOUS VEIN HARVEST CONDUITS;  Surgeon: Melrose Nakayama, MD;  Location: [facility];  Service: Open Heart Surgery;  Laterality: N/A;  ? CORONARY/GRAFT ACUTE MI REVASCULARIZATION N/A 01/16/2021  ? Procedure: Coronary/Graft Acute MI Revascularization;  Surgeon: Troy Sine, MD;  Location: [facility];  Service: Cardiovascular;  Laterality: N/A;  ? ENDOVEIN HARVEST OF GREATER SAPHENOUS VEIN Left 01/20/2021  ? Procedure: ENDOVEIN HARVEST OF GREATER SAPHENOUS VEIN;  Surgeon: Melrose Nakayama, MD;  Location: [place];  Service: Open Heart Surgery;  Laterality: Left;  ? HEMORRHOID SURGERY    ? IABP INSERTION N/A 01/16/2021  ? Procedure: IABP Insertion;  Surgeon: Troy Sine, MD;  Location: [facility];  Service: Cardiovascular;  Laterality: N/A;  ? LEFT HEART CATH AND CORONARY ANGIOGRAPHY N/A 01/16/2021  ? Procedure: LEFT HEART CATH AND CORONARY ANGIOGRAPHY;  Surgeon: Troy Sine, MD;  Location: [facility];  Service: Cardiovascular;  Laterality: N/A;  ? TEE  WITHOUT CARDIOVERSION N/A 01/20/2021  ? Procedure: TRANSESOPHAGEAL ECHOCARDIOGRAM (TEE);  Surgeon: Melrose Nakayama, MD;  Location: [facility];  Service: Open Heart Surgery;  Laterality: N/A;  ?  ? ?Home Medications:  ?Prior to Admission medications   ?Medication Sig Start Date End Date Taking? Authorizing Provider  ?aspirin EC 81 MG tablet Take 1 tablet (81 mg total) by mouth daily. Swallow whole. ?Patient taking differently: Take 81 mg by mouth daily. 01/29/21 05/12/22 Yes Roddenberry, Arlis Porta, PA-C  ?atorvastatin (LIPITOR) 80 MG tablet Take 1 tablet (80 mg total) by mouth daily. 05/26/21  Yes Deberah Pelton, NP  ?carvedilol (COREG) 6.25 MG tablet Take 1 tablet (6.25 mg total) by mouth 2 (two) times daily with a meal. 06/02/21  Yes Troy Sine, MD  ?clopidogrel (PLAVIX) 75 MG tablet Take 1 tablet (75 mg total) by mouth daily. 01/29/21 01/29/22 Yes Roddenberry, Arlis Porta, PA-C  ?dapagliflozin propanediol (FARXIGA) 10 MG TABS tablet Take 1 tablet (10 mg total) by mouth daily before breakfast. ?Patient taking differently: Take 5 mg by mouth daily before breakfast. 06/06/21  Yes Cleaver, Jossie Ng, NP  ?furosemide (LASIX) 20 MG tablet Take 1 tablet (20 mg total) by mouth daily. 09/07/21  Yes Almyra Deforest, PA  ?sacubitril-valsartan (ENTRESTO) 24-26 MG Take 1 tablet by mouth 2 (two) times daily. 08/25/21  Yes Almyra Deforest, PA  ?sildenafil (VIAGRA) 25 MG tablet Take 1 tablet (25 mg total) by mouth daily as needed for erectile dysfunction (REFILLS WITH PCP PNLY). ?Patient taking differently: Take 25 mg by mouth daily as needed for erectile dysfunction. 05/26/21  Yes Deberah Pelton, NP  ?primidone (MYSOLINE) 50 MG tablet Take 1-2 tablets daily for tremor ?Patient taking differently: Take 100 mg by mouth daily. 08/31/21   Suzzanne Cloud, NP  ? ? ?Inpatient Medications: ?Scheduled Meds: ? ?Continuous Infusions: ? ?PRN Meds: ? ? ?Allergies:   No Known Allergies ? ?Social History:   ?Social History  ? ?Socioeconomic History  ? Marital  status: Single  ?  Spouse name: Not on file  ? Number of children: Not on file  ? Years of education: Not on file  ? Highest education level: Not on file  ?Occupational History  ? 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 Smoking status: Former  ? Smokeless tobacco: Current  ?  Types: Chew  ?Substance and Sexual Activity  ? Alcohol use: Yes  ?  Comment: one beer every two months  ? Drug use: No  ? Sexual activity: Not on file  ?Other Topics Concern  ? Not on file  ?Social History Narrative  ? Right handed   ? ?Social Determinants of Health  ? ?Financial Resource Strain: Not on file  ?Food Insecurity: Not on file  ?Transportation Needs: Not on file  ?Physical Activity: Not on file  ?Stress: Not on file  ?Social Connections: Not on file  ?Intimate Partner Violence: Not on file  ?  ?  Family History:   ? ?Family History  ?Problem Relation Age of Onset  ? Hypertension Mother   ? Parkinson's disease Mother   ? Alzheimer's disease Mother   ? Hypertension Father   ? Suicidality Father   ?  ? ?ROS:  ?Please see the history of present illness.  ? ?All other ROS reviewed and negative.    ? ?Physical Exam/Data:  ? ?Vitals:  ? 09/17/21 1200 09/17/21 1215 09/17/21 1230 09/17/21 1300  ?BP: (!) 121/93 114/86 (!) 130/101 (!) 128/102  ?Pulse: (!) 111 (!) 110 (!) 110 (!) 110  ?Resp: 19 (!) 21 (!) 25 (!) 25  ?Temp:      ?TempSrc:      ?SpO2: 98% 100% 99% 100%  ?Weight:      ?Height:      ? ?No intake or output data in the 24 hours ending 09/17/21 1312 ? ?  09/17/2021  ? 10:28 AM 08/31/2021  ?  8:00 AM 08/25/2021  ?  8:48 AM  ?Last 3 Weights  ?Weight (lbs) 220 lb 215 lb 219 lb  ?Weight (kg) 99.791 kg 97.523 kg 99.338 kg  ?   ?Body mass index is 27.5 kg/m?.  ?General:  mild distress ?HEENT: normal ?Neck: +++ JVD ?Vascular: No carotid bruits; Distal pulses 2+ bilaterally ?Cardiac:  normal S1, S2; tachycardic;  ?Lungs:  mild increased WOB, decreased BS in the bases ?Abd: soft, nontender, no hepatomegaly  ?Ext: no  edema ?Musculoskeletal:  No deformities, BL LE pitting edema ?Skin: warm and dry  ?Neuro:  CNs 2-12 intact, no focal abnormalities noted ?Psych:  Normal affect  ? ?EKG:  The EKG was personally reviewed and demonstrat

## 2021-09-17 NOTE — Telephone Encounter (Signed)
76 yo male with CAD, s/p prior MI and CABG in 01/2021, HFrEF. ?He was seen by Azalee Course, PA-C in April 2023 for volume overload.  His ARB was changed to [facility].  He was to take Lasix for 5 days. ? ?The patient called the answering service today with worsening shortness of breath.  He has been taking Lasix every day since his last visit without improvement in symptoms.  He notes shortness of breath at rest and with minimal exertion.  He notes orthopnea, leg edema.  He is not having chest pain. ? ?The patient is having symptoms of acute HFrEF, NYHA class III-IV.   ? ?PLAN:  ?I have recommended that he come to the emergency room for evaluation and management.  He agrees with this plan. ? ?Timothy Newcomer, PA-C    ?09/17/2021 9:16 AM   ?

## 2021-09-17 NOTE — ED Triage Notes (Signed)
Patient complains of increasing SOB over the past month. Taking diuretics and still noted 5lb weight gain. Denies pain. Pedal edema noted. Patient reports SOB even at rest ?

## 2021-09-18 ENCOUNTER — Inpatient Hospital Stay (HOSPITAL_COMMUNITY): Payer: Medicare HMO

## 2021-09-18 DIAGNOSIS — I484 Atypical atrial flutter: Secondary | ICD-10-CM | POA: Diagnosis not present

## 2021-09-18 DIAGNOSIS — I5023 Acute on chronic systolic (congestive) heart failure: Secondary | ICD-10-CM

## 2021-09-18 DIAGNOSIS — I5021 Acute systolic (congestive) heart failure: Secondary | ICD-10-CM

## 2021-09-18 DIAGNOSIS — I340 Nonrheumatic mitral (valve) insufficiency: Secondary | ICD-10-CM | POA: Diagnosis not present

## 2021-09-18 LAB — BASIC METABOLIC PANEL
Anion gap: 4 — ABNORMAL LOW (ref 5–15)
BUN: 27 mg/dL — ABNORMAL HIGH (ref 8–23)
CO2: 29 mmol/L (ref 22–32)
Calcium: 8.7 mg/dL — ABNORMAL LOW (ref 8.9–10.3)
Chloride: 106 mmol/L (ref 98–111)
Creatinine, Ser: 1.49 mg/dL — ABNORMAL HIGH (ref 0.61–1.24)
GFR, Estimated: 49 mL/min — ABNORMAL LOW (ref >60)
Glucose, Bld: 87 mg/dL (ref 70–99)
Potassium: 4 mmol/L (ref 3.5–5.1)
Sodium: 139 mmol/L (ref 135–145)

## 2021-09-18 LAB — ECHOCARDIOGRAM LIMITED
Calc EF: 24.7 %
Height: 75 in
MV M vel: 3.99 m/s
MV Peak grad: 63.7 mmHg
Radius: 0.4 cm
S' Lateral: 5.5 cm
Single Plane A2C EF: 22.8 %
Single Plane A4C EF: 24.6 %
Weight: 3365.1 oz

## 2021-09-18 LAB — MAGNESIUM: Magnesium: 2.2 mg/dL (ref 1.7–2.4)

## 2021-09-18 MED ORDER — DAPAGLIFLOZIN PROPANEDIOL 10 MG PO TABS
10.0000 mg | ORAL_TABLET | Freq: Every day | ORAL | Status: DC
  Administered 2021-09-19 – 2021-09-21 (×3): 10 mg via ORAL
  Filled 2021-09-18 (×3): qty 1

## 2021-09-18 MED ORDER — METOPROLOL TARTRATE 25 MG PO TABS: 25.0000 mg | ORAL_TABLET | Freq: Two times a day (BID) | ORAL | Status: DC

## 2021-09-18 MED ORDER — APIXABAN 5 MG PO TABS
5.0000 mg | ORAL_TABLET | Freq: Two times a day (BID) | ORAL | Status: DC
  Administered 2021-09-18 – 2021-09-21 (×7): 5 mg via ORAL
  Filled 2021-09-18 (×7): qty 1

## 2021-09-18 MED ORDER — PERFLUTREN LIPID MICROSPHERE
1.0000 mL | INTRAVENOUS | Status: AC | PRN
  Administered 2021-09-18: 2 mL via INTRAVENOUS
  Filled 2021-09-18: qty 10

## 2021-09-18 MED ORDER — METOPROLOL TARTRATE 25 MG PO TABS
25.0000 mg | ORAL_TABLET | Freq: Two times a day (BID) | ORAL | Status: DC
  Administered 2021-09-18 – 2021-09-21 (×5): 25 mg via ORAL
  Filled 2021-09-18 (×6): qty 1

## 2021-09-18 NOTE — H&P (Addendum)
?Cardiology Consultation:  ? ?Patient ID: Timothy Munoz ?MRN: [id number]; DOB: [birth year] ? ?Admit date: 09/17/2021 ?Date of Consult: 09/18/2021 ? ?PCP:  Orpah Melter, MD ?  ?[facility] Providers ?Cardiologist:  Shelva Majestic, MD      ? ? ?Patient Profile:  ? ?Timothy Munoz is a 76 y.o. male with a hx anterolateral STEMI with balloon angioplasty of ostial LAD, cardiogenic shock requiring IABP  s/p CABG x 3 on 01/20/2021, hypertension, hyperlipidemia and obesity, who is being seen 09/18/2021 for the evaluation of heart failure at the request of Dr. Laverta Baltimore. ? ?History of Present Illness:  ? ?Timothy Munoz is a 76 y.o. male with a hx of CAD s/p CABG x 3 on 01/20/2021, hypertension, hyperlipidemia and obesity.  Patient had anterolateral STEMI with balloon angioplasty of ostial LAD.  Hospital course was complicated by cardiogenic shock.  EF 35%.  He required intra-aortic balloon pump for support.  Patient ultimately underwent CABG x3 on 01/20/2021.  He did have postop atrial fibrillation and was placed on amiodarone and the digoxin.  Echocardiogram obtained on 01/25/2021 showed EF 40 to 45%, grade 2 DD, normal RV, no significant valve issue.  On follow-up, he was volume overloaded and was treated with diuretic therapy.  Patient was last seen by Coletta Memos on 05/26/2021, his Lasix has been changed to as needed.  Amiodarone was stopped.  He was given a prescription for sildenafil 20 mg.  Repeat echocardiogram obtained on 06/06/2021 demonstrated EF 35 to 40%, grade 2 DD, moderate elevated pulmonary artery systolic pressure with RVSP 53.4 mmHg, biatrial moderate enlargement, moderate to severe MR.  He was started on Farxiga, Farxiga dose was reduced by half due to elevation of the creatinine. Saw Hao in follow up in April. He endorsed SOB on exertion. He was start on entresto. He was recommended to take lasix 20 mg daily for 5 days then switch to PRN. He remained SOB. He presents here with dyspnea with mild exertion. About ~5  pound weight gain (99kg; was 117 kg when he left the hospital). +orthopnea an PND. Legs with pitting edema. Notes low appetite and muscle loss.  BNP 1214. Cxray shows mild interstitial edema. Notes some productive cough. No report of fever. He notes he is taking the lasix 20 mg daily. He denies chest pressure. Symptoms are not similar to his MI. He is otherwise stable. Sinus tachycardia. On room air satting high 90s-100 ? ? ?Wt Readings from Last 3 Encounters:  ?09/18/21 95.4 kg  ?08/31/21 97.5 kg  ?08/25/21 99.3 kg  ? ? ? ?Past Medical History:  ?Diagnosis Date  ? Hyperlipidemia   ? mild   ? Hypertension   ? Knee pain   ? Obesity   ? Tremor   ? 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 ? ?Past Surgical History:  ?Procedure Laterality Date  ? APPENDECTOMY    ? BACK SURGERY    ? CORONARY ARTERY BYPASS GRAFT N/A 01/20/2021  ? Procedure: CORONARY ARTERY BYPASS GRAFTING (CABG) X 3 ON PUMP, USING LEFT INTERNAL MAMMARY ARTERY AND LEFT ENDOSCOPIC GREATER SAPHENOUS VEIN HARVEST CONDUITS;  Surgeon: Melrose Nakayama, MD;  Location: [facility];  Service: Open Heart Surgery;  Laterality: N/A;  ? CORONARY/GRAFT ACUTE MI REVASCULARIZATION N/A 01/16/2021  ? Procedure: Coronary/Graft Acute MI Revascularization;  Surgeon: Troy Sine, MD;  Location: [facility];  Service: Cardiovascular;  Laterality: N/A;  ? ENDOVEIN HARVEST OF GREATER SAPHENOUS VEIN Left 01/20/2021  ? Procedure: ENDOVEIN HARVEST OF GREATER SAPHENOUS VEIN;  Surgeon: Melrose Nakayama, MD;  Location: [facility];  Service: Open Heart Surgery;  Laterality: Left;  ? HEMORRHOID SURGERY    ? IABP INSERTION N/A 01/16/2021  ? Procedure: IABP Insertion;  Surgeon: Troy Sine, MD;  Location: [facility];  Service: Cardiovascular;  Laterality: N/A;  ? LEFT HEART CATH AND CORONARY ANGIOGRAPHY N/A 01/16/2021  ? Procedure: LEFT HEART CATH AND CORONARY ANGIOGRAPHY;  Surgeon: Troy Sine, MD;  Location: [facility];  Service: Cardiovascular;  Laterality: N/A;  ? TEE  WITHOUT CARDIOVERSION N/A 01/20/2021  ? Procedure: TRANSESOPHAGEAL ECHOCARDIOGRAM (TEE);  Surgeon: Melrose Nakayama, MD;  Location: [facility];  Service: Open Heart Surgery;  Laterality: N/A;  ?  ? ?Home Medications:  ?Prior to Admission medications   ?Medication Sig Start Date End Date Taking? Authorizing Provider  ?aspirin EC 81 MG tablet Take 1 tablet (81 mg total) by mouth daily. Swallow whole. ?Patient taking differently: Take 81 mg by mouth daily. 01/29/21 05/12/22 Yes Roddenberry, Arlis Porta, PA-C  ?atorvastatin (LIPITOR) 80 MG tablet Take 1 tablet (80 mg total) by mouth daily. 05/26/21  Yes Deberah Pelton, NP  ?carvedilol (COREG) 6.25 MG tablet Take 1 tablet (6.25 mg total) by mouth 2 (two) times daily with a meal. 06/02/21  Yes Troy Sine, MD  ?clopidogrel (PLAVIX) 75 MG tablet Take 1 tablet (75 mg total) by mouth daily. 01/29/21 01/29/22 Yes Roddenberry, Arlis Porta, PA-C  ?dapagliflozin propanediol (FARXIGA) 10 MG TABS tablet Take 1 tablet (10 mg total) by mouth daily before breakfast. ?Patient taking differently: Take 5 mg by mouth daily before breakfast. 06/06/21  Yes Cleaver, Jossie Ng, NP  ?furosemide (LASIX) 20 MG tablet Take 1 tablet (20 mg total) by mouth daily. 09/07/21  Yes Almyra Deforest, PA  ?sacubitril-valsartan (ENTRESTO) 24-26 MG Take 1 tablet by mouth 2 (two) times daily. 08/25/21  Yes Almyra Deforest, PA  ?sildenafil (VIAGRA) 25 MG tablet Take 1 tablet (25 mg total) by mouth daily as needed for erectile dysfunction (REFILLS WITH PCP PNLY). ?Patient taking differently: Take 25 mg by mouth daily as needed for erectile dysfunction. 05/26/21  Yes Deberah Pelton, NP  ?primidone (MYSOLINE) 50 MG tablet Take 1-2 tablets daily for tremor ?Patient taking differently: Take 100 mg by mouth daily. 08/31/21   Suzzanne Cloud, NP  ? ? ?Inpatient Medications: ?Scheduled Meds: ? ?Continuous Infusions: ? ?PRN Meds: ? ? ?Allergies:   No Known Allergies ? ?Social History:   ?Social History  ? ?Socioeconomic History  ? Marital  status: Single  ?  Spouse name: Not on file  ? Number of children: Not on file  ? Years of education: Not on file  ? Highest education level: Not on file  ?Occupational History  ? 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 Smoking status: Former  ? Smokeless tobacco: Current  ?  Types: Chew  ?Substance and Sexual Activity  ? Alcohol use: Yes  ?  Comment: one beer every two months  ? Drug use: No  ? Sexual activity: Not on file  ?Other Topics Concern  ? Not on file  ?Social History Narrative  ? Right handed   ? ?Social Determinants of Health  ? ?Financial Resource Strain: Not on file  ?Food Insecurity: Not on file  ?Transportation Needs: Not on file  ?Physical Activity: Not on file  ?Stress: Not on file  ?Social Connections: Not on file  ?Intimate Partner Violence: Not on file  ?  ?  Family History:   ? ?Family History  ?Problem Relation Age of Onset  ? Hypertension Mother   ? Parkinson's disease Mother   ? Alzheimer's disease Mother   ? Hypertension Father   ? Suicidality Father   ?  ? ?ROS:  ?Please see the history of present illness.  ? ?All other ROS reviewed and negative.    ? ?Physical Exam/Data:  ? ?Vitals:  ? 09/18/21 [id number] 09/18/21 [id number] 09/18/21 [id number] 09/18/21 [id number]  ?BP:  100/79    ?Pulse: (!) 108 (!) 109 82   ?Resp:    20  ?Temp:  97.8 ?F (36.6 ?C)    ?TempSrc:  Oral    ?SpO2:  97%    ?Weight:      ?Height:      ? ? ?Intake/Output Summary (Last 24 hours) at 09/18/2021 0937 ?Last data filed at 09/18/2021 0700 ?Gross per 24 hour  ?Intake 480 ml  ?Output 3325 ml  ?Net -2845 ml  ? ? ?  09/18/2021  ? 12:10 AM 09/17/2021  ?  3:47 PM 09/17/2021  ? 10:28 AM  ?Last 3 Weights  ?Weight (lbs) 210 lb 5.1 oz 214 lb 8.1 oz 220 lb  ?Weight (kg) 95.4 kg 97.3 kg 99.791 kg  ?   ?Body mass index is 26.29 kg/m?.  ?General:  mild distress ?HEENT: normal ?Neck: +++ JVD ?Vascular: No carotid bruits; Distal pulses 2+ bilaterally ?Cardiac:  normal S1, S2; tachycardic;  ?Lungs:  mild increased WOB, decreased BS in the  bases ?Abd: soft, nontender, no hepatomegaly  ?Ext: no edema ?Musculoskeletal:  No deformities, BL LE pitting edema ?Skin: warm and dry  ?Neuro:  CNs 2-12 intact, no focal abnormalities noted ?Psych:  Normal affect

## 2021-09-18 NOTE — Progress Notes (Signed)
?  Mobility Specialist Criteria Algorithm Info. ? ? 09/18/21 1637  ?Mobility  ?Activity Ambulated independently in hallway  ?Range of Motion/Exercises Active;All extremities  ?Level of Assistance Independent  ?Assistive Device None  ?Distance Ambulated (ft) 360 ft  ?Activity Response Tolerated well  ? ?Patient received in supine agreeable to participate in mobility. Ambulated in hallway independently with steady gait. Returned to room without complaint or incident. Was left in bed with all needs met, call bell in reach.  ? ?09/18/2021 ?4:38 PM ? ?Martinique Teniqua Marron, CMS, BS EXP ?Acute Rehabilitation Services  ?YYPEJ:[phone] ?Office: [phone] ? ?

## 2021-09-18 NOTE — Progress Notes (Signed)
Central tele called that patient converted to A Flutter, VSS patient denies pain. 12 lead EKG done. Cardiology and Card PA notified. See epic for intervention. ?

## 2021-09-18 NOTE — Progress Notes (Signed)
2010 Patient blood pressure 87/61 HR 100-110's remains atrial flutter asymptomatic .Dr. Cherly Beach notified. Will continue to monitor patient and continue with current plan of care. ?

## 2021-09-18 NOTE — Progress Notes (Signed)
? ?Progress Note ? ?Patient Name: Timothy Munoz ?Date of Encounter: 09/18/2021 ? ?[facility] Cardiologist: Shelva Majestic, MD   ? ?Subjective  ? ?76 year old gentleman with a history of coronary artery disease, coronary artery bypass grafting, hypertension, hyperlipidemia, obesity. ?He was admitted yesterday with shortness of breath and diagnosed with worsening congestive heart failure. ? ?He is he was recently put on Entresto and his Lasix was changed to as needed. ?Echocardiogram today reveals an LVEF of 26%.  He has severe LV dysfunction.  Moderate to severe mitral regurgitation ?The MR was previously trivial - mild . Is thought to be due to LV dilatation  ? ?He has diuresed 3 liters so far this admission ? ?He appears to be in rapid atrial flutter. ?He cannot tell that his HR is elevated.    ?Is tachycardic - ecg appears to be atrial flutter  ?This tachycardia may be the etiology of his LV dysfunction. ? ?Inpatient Medications  ?  ?Scheduled Meds: ? aspirin EC  81 mg Oral Daily  ? atorvastatin  80 mg Oral Daily  ? carvedilol  6.25 mg Oral BID WC  ? clopidogrel  75 mg Oral Daily  ? dapagliflozin propanediol  5 mg Oral QAC breakfast  ? enoxaparin (LOVENOX) injection  40 mg Subcutaneous Q24H  ? furosemide  40 mg Intravenous BID  ? potassium chloride  20 mEq Oral Daily  ? primidone  100 mg Oral Daily  ? sodium chloride flush  3 mL Intravenous Q12H  ? ?Continuous Infusions: ? sodium chloride    ? ?PRN Meds: ?sodium chloride, acetaminophen, ondansetron (ZOFRAN) IV, sodium chloride flush  ? ?Vital Signs  ?  ?Vitals:  ? 09/18/21 [id number] 09/18/21 [id number] 09/18/21 [id number] 09/18/21 [id number]  ?BP:  100/79    ?Pulse: (!) 108 (!) 109 82   ?Resp:    20  ?Temp:  97.8 ?F (36.6 ?C)    ?TempSrc:  Oral    ?SpO2:  97%    ?Weight:      ?Height:      ? ? ?Intake/Output Summary (Last 24 hours) at 09/18/2021 0933 ?Last data filed at 09/18/2021 0700 ?Gross per 24 hour  ?Intake 480 ml  ?Output 3325 ml  ?Net -2845 ml  ? ? ?  09/18/2021  ? 12:10 AM  09/17/2021  ?  3:47 PM 09/17/2021  ? 10:28 AM  ?Last 3 Weights  ?Weight (lbs) 210 lb 5.1 oz 214 lb 8.1 oz 220 lb  ?Weight (kg) 95.4 kg 97.3 kg 99.791 kg  ?   ? ?Telemetry  ?  ?Atrial flutter with 2:1 AV block  - Personally Reviewed ? ?ECG  ?  ? - Personally Reviewed ? ?Physical Exam  ? ?GEN:  elderly male , NAD  ?Neck: No JVD ?Cardiac: RRR, no murmurs, rubs, or gallops.  ?Respiratory: rales, posteriorly  ?GI: Soft, nontender, non-distended  ?MS: No edema; No deformity. ?Neuro:  Nonfocal  ?Psych: Normal affect  ? ?Labs  ?  ?High Sensitivity Troponin:   ?Recent Labs  ?Lab 09/17/21 ?1035 09/17/21 ?1221  ?TROPONINIHS 15 13  ?   ?Chemistry ?Recent Labs  ?Lab 09/17/21 ?1035 09/18/21 ?0331  ?NA 140 139  ?K 4.3 4.0  ?CL 107 106  ?CO2 26 29  ?GLUCOSE 87 87  ?BUN 30* 27*  ?CREATININE 1.42* 1.49*  ?CALCIUM 9.1 8.7*  ?PROT 6.7  --   ?ALBUMIN 3.6  --   ?AST 21  --   ?ALT 19  --   ?ALKPHOS 121  --   ?  BILITOT 0.8  --   ?GFRNONAA 52* 49*  ?ANIONGAP 7 4*  ?  ?Lipids No results for input(s): CHOL, TRIG, HDL, LABVLDL, LDLCALC, CHOLHDL in the last 168 hours.  ?Hematology ?Recent Labs  ?Lab 09/17/21 ?1035  ?WBC 5.6  ?RBC 4.57  ?HGB 11.6*  ?HCT 39.1  ?MCV 85.6  ?MCH 25.4*  ?MCHC 29.7*  ?RDW 18.0*  ?PLT 303  ? ?Thyroid  ?Recent Labs  ?Lab 09/17/21 ?1905  ?TSH 5.397*  ?  ?BNP ?Recent Labs  ?Lab 09/17/21 ?1035 09/17/21 ?1905  ?BNP 1,214.9* 824.1*  ?  ?DDimer No results for input(s): DDIMER in the last 168 hours.  ? ?Radiology  ?  ?DG Chest 2 View ? ?Result Date: 09/17/2021 ?CLINICAL DATA:  Shortness of breath EXAM: CHEST - 2 VIEW COMPARISON:  03/07/2021 and prior studies FINDINGS: UPPER limits normal heart size noted. Interstitial opacities are present suggestive of mild interstitial edema. Trace bilateral pleural effusions are noted. There is no evidence of pneumothorax or acute bony abnormality. IMPRESSION: Interstitial opacities suggestive of mild interstitial edema. Trace bilateral pleural effusions. Electronically Signed   By: Margarette Canada  M.D.   On: 09/17/2021 11:56   ? ?Cardiac Studies  ? ?  ? ?Patient Profile  ?   ?76 y.o. male   ? ?Assessment & Plan  ?  ?  ? Coronary artery disease: He is not having any episodes of angina.  He status post bypass surgery last year. ? ?2.  Acute on chronic combined systolic and diastolic congestive heart failure: His ejection fraction is 26%.  This is decreased since his surgery.  He has had tachycardia for the past several months I suspect.  This appears to be due to atrial flutter.  Some of this may be rate related cardiomyopathy. ?Will schedule him for TEE / CV on Wednesday  ? ?3.  Atrial flutter: We will start him on Eliquis.  We will change the carvedilol to metoprolol in an effort to slow his heart rate down.  I suspect that we will need to do a transesophageal echo cardioversion in the next several days. ? ? ? ? ?   ? ?For questions or updates, please contact [facility] ?Please consult [email or web address] for contact info under  ? ?  ?   ?Signed, ?Mertie Moores, MD  ?09/18/2021, 9:33 AM   ? ?

## 2021-09-18 NOTE — Evaluation (Signed)
Physical Therapy Evaluation ?Patient Details ?Name: Timothy Munoz ?MRN: [id number] ?DOB: 05/15/1945 ?Today's Date: 09/18/2021 ? ?History of Present Illness ? The pt is a 76 yo male presenting 5/7 with SOB x1 month with 5lb wt gain. Upon work up, pt found to have ischemic cardiomyopathy with CHF exacerbation. PMH includes: CABG x3 01/2021, STEMI with baloon angioplasty of LAD, cardiogenic shock, HTN, HLD, and obesity. ?  ?Clinical Impression ? Pt in bed upon arrival of PT, agreeable to evaluation at this time. Prior to admission the pt was completely independent, but reports progressive difficulties with SOB and endurance. The pt was able to demo good stability and independence with all transfers and hallway ambulation, and even completed 10 steps with use of single rail and VSS on RA. The pt did have slightly elevated RR with exertion, educated on progressive endurance training he can complete at home. The pt Will benefit from skilled PT to further progress endurance and capacity for higher-intensity activities, as well as education on HEP the pt can complete at home. Safe to return home when medically stable.    ?  ?Gait Speed: 0.27m/s. (Gait speed < 1.63m/s indicates increased risk of falls)  ? ? ?Recommendations for follow up therapy are one component of a multi-disciplinary discharge planning process, led by the attending physician.  Recommendations may be updated based on patient status, additional functional criteria and insurance authorization. ? ?Follow Up Recommendations No PT follow up ? ?  ?Assistance Recommended at Discharge PRN  ?Patient can return home with the following ?   ? ?  ?Equipment Recommendations None recommended by PT  ?Recommendations for Other Services ?    ?  ?Functional Status Assessment Patient has had a recent decline in their functional status and demonstrates the ability to make significant improvements in function in a reasonable and predictable amount of time.  ? ?  ?Precautions /  Restrictions Precautions ?Precautions: None ?Restrictions ?Weight Bearing Restrictions: No  ? ?  ? ?Mobility ? Bed Mobility ?Overal bed mobility: Independent ?  ?  ?  ?  ?  ?  ?  ?  ? ?Transfers ?Overall transfer level: Independent ?Equipment used: None ?  ?  ?  ?  ?  ?  ?  ?General transfer comment: no evidence of instability ?  ? ?Ambulation/Gait ?Ambulation/Gait assistance: Independent ?Gait Distance (Feet): 200 Feet ?Assistive device: None ?Gait Pattern/deviations: WFL(Within Functional Limits) ?Gait velocity: 0.71 m/s ?Gait velocity interpretation: 1.31 - 2.62 ft/sec, indicative of limited community ambulator ?  ?General Gait Details: pt with steady gait, reports is his baseline ? ?Stairs ?Stairs: Yes ?Stairs assistance: Supervision ?Stair Management: One rail Right, Alternating pattern, Forwards ?Number of Stairs: 10 ?General stair comments: steady with use of single rail, HR steady 111-112, SpO2 99-100% ? ?  ? ?Balance Overall balance assessment: Independent ?  ?  ?  ?  ?  ?  ?  ?  ?  ?  ?  ?  ?  ?  ?  ?  ?  ?  ?   ? ? ? ?Pertinent Vitals/Pain Pain Assessment ?Pain Assessment: No/denies pain  ? ? ?Home Living Family/patient expects to be discharged to:: Private residence ?Living Arrangements: Alone ?Available Help at Discharge: Family;Available 24 hours/day ?Type of Home: House ?Home Access: Level entry ?  ?  ?Alternate Level Stairs-Number of Steps: split level home with 6-7 steps ?Home Layout: Multi-level ?Home Equipment: Conservation officer, nature (2 wheels) ?   ?  ?Prior Function Prior Level of Function :  Independent/Modified Independent;Driving ?  ?  ?  ?  ?  ?  ?Mobility Comments: independent without DME, no consistent exercise ?  ?  ? ? ?Hand Dominance  ? Dominant Hand: Right ? ?  ?Extremity/Trunk Assessment  ? Upper Extremity Assessment ?Upper Extremity Assessment: Overall WFL for tasks assessed ?  ? ?Lower Extremity Assessment ?Lower Extremity Assessment: Overall WFL for tasks assessed ?  ? ?Cervical / Trunk  Assessment ?Cervical / Trunk Assessment: Normal  ?Communication  ? Communication: No difficulties  ?Cognition Arousal/Alertness: Awake/alert ?Behavior During Therapy: [facility] for tasks assessed/performed ?Overall Cognitive Status: Within Functional Limits for tasks assessed ?  ?  ?  ?  ?  ?  ?  ?  ?  ?  ?  ?  ?  ?  ?  ?  ?  ?  ?  ? ?  ?General Comments General comments (skin integrity, edema, etc.): VSS with all activity, RR to high of 31 after activity ? ?  ?   ? ?Assessment/Plan  ?  ?PT Assessment Patient needs continued PT services  ?PT Problem List Cardiopulmonary status limiting activity;Decreased activity tolerance ? ?   ?  ?PT Treatment Interventions Therapeutic exercise;Patient/family education   ? ?PT Goals (Current goals can be found in the Care Plan section)  ?Acute Rehab PT Goals ?Patient Stated Goal: return home and improve endurance ?PT Goal Formulation: With patient ?Time For Goal Achievement: 10/02/21 ?Potential to Achieve Goals: Good ? ?  ?Frequency Min 1X/week ?  ? ? ?   ?AM-PAC PT "6 Clicks" Mobility  ?Outcome Measure Help needed turning from your back to your side while in a flat bed without using bedrails?: None ?Help needed moving from lying on your back to sitting on the side of a flat bed without using bedrails?: None ?Help needed moving to and from a bed to a chair (including a wheelchair)?: None ?Help needed standing up from a chair using your arms (e.g., wheelchair or bedside chair)?: None ?Help needed to walk in hospital room?: None ?Help needed climbing 3-5 steps with a railing? : A Little ?6 Click Score: 23 ? ?  ?End of Session   ?Activity Tolerance: Patient tolerated treatment well ?Patient left: in bed (with echo arriving) ?Nurse Communication: Mobility status ?PT Visit Diagnosis: Other abnormalities of gait and mobility (R26.89) ?  ? ?Time: [id number] ?PT Time Calculation (min) (ACUTE ONLY): 9 min ? ? ?Charges:   PT Evaluation ?$PT Eval Low Complexity: 1 Low ?  ?  ?   ? ? ?[place] Car[place], PT,  DPT  ? ?Acute Rehabilitation Department ?Pager #: [phone] - [phone] ? ?Sandra Cockayne ?09/18/2021, 8:54 AM ? ?

## 2021-09-18 NOTE — Progress Notes (Addendum)
Heart Failure Stewardship Pharmacist Progress Note ? ? ?PCP: Orpah Melter, MD ?PCP-Cardiologist: Shelva Majestic, MD  ? ? ?HPI:  ?76 yo male with PMH of CAD, STEMI s/p three-vessel CABG in 01/2021, HTN, HLD, obesity. LVEF of 40-45% unchanged prior to and after CABG on 01/19/2021. Repeat ECHO in 06/06/2021 showed reduced LVEF of 35-40% with mild aortic calcification, moderate-severe mitral valve regurgitation, moderately elevated pulmonary pressures, and grade II diastolic dysfunction. Wilder Glade was reduced to 5 mg daily on 06/16/2021 after slight SCr increase (1.45 > 1.58). Repeat SCr 1.43, Entresto 24-26 mg twice daily was added 04/14 and repeat SCr continued to trend down to 1.34. Farxiga 5 mg continued.  ? ?Presented 05/07 to ED with SOB for 1 month and endorsed 5lb weight gain. CXR with mild interstitial edema and trace bilateral pleural effusions. ECHO this admission revealed severely reduced LV function with EF 26% and moderate-severe MVR. Of note, admission complicated by A flutter and tachycardia in 100s - carvedilol switched to metoprolol tartrate for additional rate control. Due for TEE/DCCV on Wednesday. ? ?Current HF Medications: ?Diuretic: furosemide 40 mg IV twice daily ?Beta Blocker: metoprolol 25 mg twice daily ?SGLT2i: dapagliflozin 5 mg daily ?Other: potassium 20 mEq daily ? ?Prior to admission HF Medications: ?Diuretic: furosemide 20 mg daily ?Beta blocker: carvedilol 6.25 mg twice daily ?ACE/ARB/ARNI: Delene Loll 24-26 mg twice daily ?SGLT2i: dapagliflozin 5 mg daily  ? ?Pertinent Lab Values: ?Serum creatinine 1.49, BUN 27, Potassium 4.0, Sodium 139, BNP 719.7, Magnesium 2.5 (01/21/2021), A1c 6.0% (01/16/2021)  ? ?Vital Signs: ?Weight: 210 lbs (admission weight: 214 lbs) ?Blood pressure: 100s/70s  ?Heart rate: 110s on admission >> 80-90s  ?I/O: -2.3L yesterday; net -3.78L ? ?Medication Assistance / Insurance Benefits Check: ?Does the patient have prescription insurance?  Yes ?Type of insurance plan:  Medicare ? ? ?Outpatient Pharmacy:  ?Prior to admission outpatient pharmacy: [place], [place] ?Is the patient willing to use [facility] pharmacy at discharge? Pending ?Is the patient willing to transition their outpatient pharmacy to utilize a [facility] outpatient pharmacy?   Pending ?  ? ?Assessment: ?1. Acute on chronic combined systolic and diastolic CHF (LVEF 25%), reduced since CABG in September 2022. NYHA class III symptoms. ?- Continue diuresis with furosemide IV 40 mg twice daily ?- Continue metoprolol tartrate 25 mg twice daily ?- PTA Entresto on hold for acute on chronic kidney injury and soft BP ?- No history of MRA ?  ?Plan: ?1) Medication changes recommended at this time: ?- Increase dapaglifozin to 10 mg daily (eGFR 55, target HF dose) ?- Consider restarting PTA Entresto pending BP and renal function after cardioversion Wednesday  ?- Consider adding spironolactone 12.5 mg if BP, renal fxn and potassium allows before discharge ?- F/U magnesium  ? ?2) Patient assistance: ?- Pending ? ?3)  Education  ?- To be completed prior to discharge ? ?Laurey Arrow, PharmD ?PGY1 Pharmacy Resident ?09/18/2021  2:12 PM ? ?Kerby Nora, PharmD, BCPS ?Heart Failure Stewardship Pharmacist ?Phone [phone] ?

## 2021-09-18 NOTE — Progress Notes (Signed)
?  Echocardiogram ?2D Echocardiogram has been performed. ? ?Augustine Radar ?09/18/2021, 10:22 AM ?

## 2021-09-18 NOTE — Progress Notes (Signed)
Heart Failure Navigator Progress Note  Following this hospitalization to assess for HV TOC readiness.   ECHO pending?  Amyrah Pinkhasov, BSN, RN Heart Failure Nurse Navigator Secure Chat Only  

## 2021-09-18 NOTE — Evaluation (Signed)
Occupational Therapy Evaluation ?Patient Details ?Name: Timothy Munoz ?MRN: [id number] ?DOB: Jul 10, 1945 ?Today's Date: 09/18/2021 ? ? ?History of Present Illness The pt is a 76 yo male presenting 5/7 with SOB x1 month with 5lb wt gain. Upon work up, pt found to have ischemic cardiomyopathy with CHF exacerbation. PMH includes: CABG x3 01/2021, STEMI with baloon angioplasty of LAD, cardiogenic shock, HTN, HLD, and obesity.  ? ?Clinical Impression ?  ?Prior to this admission, patient was independent in all ADLs and IADLs and still driving. Patient is currently independent with ADLs and handed off to PT for mobility assessment. Energy conservation handout provided at end of session for patient reference. OT signing off at this time, with OT encouraging patient to work with mobility specialists while in the hospital to increase overall activity tolerance. ?   ? ?Recommendations for follow up therapy are one component of a multi-disciplinary discharge planning process, led by the attending physician.  Recommendations may be updated based on patient status, additional functional criteria and insurance authorization.  ? ?Follow Up Recommendations ? No OT follow up  ?  ?Assistance Recommended at Discharge None  ?Patient can return home with the following   ? ?  ?Functional Status Assessment ? Patient has had a recent decline in their functional status and demonstrates the ability to make significant improvements in function in a reasonable and predictable amount of time.  ?Equipment Recommendations ? None recommended by OT  ?  ?Recommendations for Other Services   ? ? ?  ?Precautions / Restrictions Precautions ?Precautions: None ?Restrictions ?Weight Bearing Restrictions: No  ? ?  ? ?Mobility Bed Mobility ?Overal bed mobility: Independent ?  ?  ?  ?  ?  ?  ?  ?  ? ?Transfers ?Overall transfer level: Independent ?Equipment used: None ?  ?  ?  ?  ?  ?  ?  ?General transfer comment: no evidence of instability ?  ? ?  ?Balance  Overall balance assessment: Independent ?  ?  ?  ?  ?  ?  ?  ?  ?  ?  ?  ?  ?  ?  ?  ?  ?  ?  ?   ? ?ADL either performed or assessed with clinical judgement  ? ?ADL Overall ADL's : At baseline;Independent ?  ?  ?  ?  ?  ?  ?  ?  ?  ?  ?  ?  ?  ?  ?  ?  ?  ?  ?  ?   ? ? ? ?Vision Baseline Vision/History: 1 Wears glasses ?Ability to See in Adequate Light: 0 Adequate ?Patient Visual Report: No change from baseline ?   ?   ?Perception   ?  ?Praxis   ?  ? ?Pertinent Vitals/Pain Pain Assessment ?Pain Assessment: No/denies pain  ? ? ? ?Hand Dominance Right ?  ?Extremity/Trunk Assessment Upper Extremity Assessment ?Upper Extremity Assessment: Overall WFL for tasks assessed ?  ?Lower Extremity Assessment ?Lower Extremity Assessment: Defer to PT evaluation ?  ?Cervical / Trunk Assessment ?Cervical / Trunk Assessment: Normal ?  ?Communication Communication ?Communication: No difficulties ?  ?Cognition Arousal/Alertness: Awake/alert ?Behavior During Therapy: [facility] for tasks assessed/performed ?Overall Cognitive Status: Within Functional Limits for tasks assessed ?  ?  ?  ?  ?  ?  ?  ?  ?  ?  ?  ?  ?  ?  ?  ?  ?  ?  ?  ?General Comments  VSS with all activity, RR to high of 31 after activity ? ?  ?Exercises   ?  ?Shoulder Instructions    ? ? ?Home Living Family/patient expects to be discharged to:: Private residence ?Living Arrangements: Alone ?Available Help at Discharge: Family;Available 24 hours/day ?Type of Home: House ?Home Access: Level entry ?  ?  ?Home Layout: Multi-level ?Alternate Level Stairs-Number of Steps: split level home with 6-7 steps ?Alternate Level Stairs-Rails: Right ?Bathroom Shower/Tub: Tub/shower unit ?  ?Bathroom Toilet: Standard ?  ?  ?Home Equipment: Agricultural consultant (2 wheels) ?  ?  ?  ? ?  ?Prior Functioning/Environment Prior Level of Function : Independent/Modified Independent;Driving ?  ?  ?  ?  ?  ?  ?Mobility Comments: independent without DME, no consistent exercise ?ADLs Comments: Independent,  driving, and completing all IADLs ?  ? ?  ?  ?OT Problem List: Decreased activity tolerance ?  ?   ?OT Treatment/Interventions:    ?  ?OT Goals(Current goals can be found in the care plan section) Acute Rehab OT Goals ?Patient Stated Goal: to go home ?OT Goal Formulation: With patient ?Time For Goal Achievement: 10/02/21 ?Potential to Achieve Goals: Good  ?OT Frequency:   ?  ? ?Co-evaluation   ?  ?  ?  ?  ? ?  ?AM-PAC OT "6 Clicks" Daily Activity     ?Outcome Measure Help from another person eating meals?: None ?Help from another person taking care of personal grooming?: None ?Help from another person toileting, which includes using toliet, bedpan, or urinal?: None ?Help from another person bathing (including washing, rinsing, drying)?: None ?Help from another person to put on and taking off regular upper body clothing?: None ?Help from another person to put on and taking off regular lower body clothing?: None ?6 Click Score: 24 ?  ?End of Session Nurse Communication: Mobility status;Other (comment) (No OT follow up) ? ?Activity Tolerance: Patient tolerated treatment well ?Patient left: Other (comment) (handed off to PT for mobility) ? ?OT Visit Diagnosis: Other abnormalities of gait and mobility (R26.89)  ?              ?Time: 4259-5638 ?OT Time Calculation (min): 9 min ?Charges:  OT General Charges ?$OT Visit: 1 Visit ?OT Evaluation ?$OT Eval Moderate Complexity: 1 Mod ? ?Pollyann Glen E. Olyver Hawes, OTR/L ?Acute Rehabilitation Services ?[phone] ?[phone]  ? ?Pollyann Glen Elisavet Buehrer ?09/18/2021, 12:09 PM ?

## 2021-09-18 NOTE — Progress Notes (Signed)
Informed by RN that patient is scheduled for doses of both metoprolol tartrate 25 mg daily and lasix 40 mg now. BP 100/77. Told RN to hold lasix for now but to administer metoprolol to maintain rate control.  ? ?Jonita Albee, PA-C ?09/18/2021 6:07 PM ? ?

## 2021-09-19 ENCOUNTER — Other Ambulatory Visit (HOSPITAL_COMMUNITY): Payer: Self-pay

## 2021-09-19 DIAGNOSIS — I484 Atypical atrial flutter: Secondary | ICD-10-CM | POA: Diagnosis not present

## 2021-09-19 DIAGNOSIS — I5023 Acute on chronic systolic (congestive) heart failure: Secondary | ICD-10-CM | POA: Diagnosis not present

## 2021-09-19 DIAGNOSIS — I340 Nonrheumatic mitral (valve) insufficiency: Secondary | ICD-10-CM | POA: Diagnosis not present

## 2021-09-19 LAB — BASIC METABOLIC PANEL
Anion gap: 8 (ref 5–15)
BUN: 32 mg/dL — ABNORMAL HIGH (ref 8–23)
CO2: 26 mmol/L (ref 22–32)
Calcium: 8.8 mg/dL — ABNORMAL LOW (ref 8.9–10.3)
Chloride: 106 mmol/L (ref 98–111)
Creatinine, Ser: 1.39 mg/dL — ABNORMAL HIGH (ref 0.61–1.24)
GFR, Estimated: 53 mL/min — ABNORMAL LOW (ref >60)
Glucose, Bld: 86 mg/dL (ref 70–99)
Potassium: 4.2 mmol/L (ref 3.5–5.1)
Sodium: 140 mmol/L (ref 135–145)

## 2021-09-19 LAB — PROTIME-INR
INR: 1.3 — ABNORMAL HIGH (ref 0.8–1.2)
Prothrombin Time: 16.2 seconds — ABNORMAL HIGH (ref 11.4–15.2)

## 2021-09-19 MED ORDER — SODIUM CHLORIDE 0.9 % IV SOLN: INTRAVENOUS | Status: DC

## 2021-09-19 MED ORDER — POLYETHYLENE GLYCOL 3350 17 G PO PACK
17.0000 g | PACK | Freq: Every day | ORAL | Status: DC | PRN
  Administered 2021-09-19 – 2021-09-21 (×2): 17 g via ORAL
  Filled 2021-09-19 (×3): qty 1

## 2021-09-19 NOTE — Progress Notes (Signed)
Heart Failure Navigator Progress Note ? ?Following this hospitalization to assess for HV TOC readiness.  ? ?EF 26 % down from 35-40% ( 05/2021) ? ?TEE/ CV planned for Wednesday 09/20/21.per note.  ? ?Rhae Hammock, BSN, RN ?Heart Failure Nurse Navigator ?Secure Chat Only  ?

## 2021-09-19 NOTE — Progress Notes (Addendum)
Heart Failure Stewardship Pharmacist Progress Note ? ? ?PCP: Orpah Melter, MD ?PCP-Cardiologist: Shelva Majestic, MD  ? ? ?HPI:  ?76 yo male with PMH of CAD, STEMI s/p three-vessel CABG in 01/2021, HTN, HLD, obesity. LVEF of 40-45% unchanged prior to and after CABG on 01/19/2021. Repeat ECHO in 06/06/2021 showed reduced LVEF of 35-40% with mild aortic calcification, moderate-severe mitral valve regurgitation, moderately elevated pulmonary pressures, and grade II diastolic dysfunction. Wilder Glade was reduced to 5 mg daily on 06/16/2021 after slight SCr increase (1.45 > 1.58). Repeat SCr 1.43, Entresto 24-26 mg twice daily was added 04/14 and repeat SCr continued to trend down to 1.34. Farxiga 5 mg continued.  ? ?Presented 05/07 to ED with SOB for 1 month and endorsed 5lb weight gain. CXR with mild interstitial edema and trace bilateral pleural effusions. ECHO this admission revealed severely reduced LV function with EF 26% and moderate-severe MVR. Of note, admission complicated by A flutter and tachycardia in 100s - carvedilol switched to metoprolol tartrate for additional rate control. Due for TEE/DCCV on Wednesday. ? ?Current HF Medications: ?Diuretic: furosemide 40 mg IV twice daily ?Beta Blocker: metoprolol 25 mg twice daily ?SGLT2i: dapagliflozin 5 mg daily ?Other: potassium 20 mEq daily ? ?Prior to admission HF Medications: ?Diuretic: furosemide 20 mg daily ?Beta blocker: carvedilol 6.25 mg twice daily ?ACE/ARB/ARNI: Delene Loll 24-26 mg twice daily ?SGLT2i: dapagliflozin 5 mg daily  ? ?Pertinent Lab Values: ?Serum creatinine 1.39, BUN 32, Potassium 4.2, Sodium 140, BNP 719.7, Magnesium 2.2, A1c 6.0% (01/16/2021)  ? ?Vital Signs: ?Weight: 209 lbs (admission weight: 214 lbs) ?Blood pressure: 100s/70s  ?Heart rate: 110s on admission >> 80-90s  ?I/O: -2.5L yesterday; net -4.9L ? ?Medication Assistance / Insurance Benefits Check: ?Does the patient have prescription insurance?  Yes ?Type of insurance plan:  Medicare ? ? ?Outpatient Pharmacy:  ?Prior to admission outpatient pharmacy: [place], [place] ?Is the patient willing to use [place] pharmacy at discharge? Pending ?Is the patient willing to transition their outpatient pharmacy to utilize a [facility] outpatient pharmacy?   Pending ?  ? ?Assessment: ?1. 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 NYHA class III symptoms. ?He is still remains tachycardic despite beta-blocker. ?- Continue diuresis with furosemide IV 40 mg twice daily ?- Continue metoprolol tartrate 25 mg twice daily ?- PTA Entresto on hold for acute on chronic kidney injury and soft BP ?- No history of MRA ?- No digoxin with age, labile renal function, RV okay ?- Could consider ivabradine with persistent tachycardia in the 100s but in A flutter ?  ?Plan: ?1) Medication changes recommended at this time: ?- Increase dapaglifozin to 10 mg daily (eGFR 55, target HF dose) ?- Consider restarting PTA Entresto pending BP and renal function after cardioversion Wednesday  ?- Consider adding spironolactone 12.5 mg if BP, renal fxn and potassium allows before discharge ? ?2) Patient assistance: ?- Pending ? ?3)  Education  ?- To be completed prior to discharge ? ?Laurey Arrow, PharmD ?PGY1 Pharmacy Resident ?09/19/2021  2:23 PM ? ?Kerby Nora, PharmD, BCPS ?Heart Failure Stewardship Pharmacist ?Phone [phone] ?

## 2021-09-19 NOTE — Plan of Care (Signed)
  Problem: Clinical Measurements: Goal: Respiratory complications will improve Outcome: Progressing   Problem: Activity: Goal: Risk for activity intolerance will decrease Outcome: Progressing   

## 2021-09-19 NOTE — Progress Notes (Signed)
?  Mobility Specialist Criteria Algorithm Info. ? ? 09/19/21 1145  ?Mobility  ?Activity Ambulated with assistance in hallway  ?Range of Motion/Exercises Active;All extremities  ?Level of Assistance Independent  ?Distance Ambulated (ft) 400 ft  ?Activity Response Tolerated well  ? ?Patient received in supine agreeable to participate in mobility. Ambulated in hallway independently with steady gait. SpO2 temporarily dropped to 88% but recovered quickly to mid 90's with cues for pursed lip breathing. Returned to room without complaint or incident. Was left in bed with all needs met, call bell in reach.  ? ?09/19/2021 ?3:48 PM ? ?Martinique Dequandre Cordova, CMS, BS EXP ?Acute Rehabilitation Services  ?HQITU:[phone] ?Office: [phone] ? ?

## 2021-09-19 NOTE — Plan of Care (Signed)

## 2021-09-19 NOTE — Progress Notes (Addendum)
? ?Progress Note ? ?Patient Name: Timothy Munoz ?Date of Encounter: 09/19/2021 ? ?[facility] Cardiologist: Shelva Majestic, MD  ? ?Subjective  ? ?Patient denies any chest pain, palpitations. Continues to have some mild sob.  ? ?Inpatient Medications  ?  ?Scheduled Meds: ? apixaban  5 mg Oral BID  ? aspirin EC  81 mg Oral Daily  ? atorvastatin  80 mg Oral Daily  ? clopidogrel  75 mg Oral Daily  ? dapagliflozin propanediol  10 mg Oral QAC breakfast  ? furosemide  40 mg Intravenous BID  ? metoprolol tartrate  25 mg Oral BID  ? potassium chloride  20 mEq Oral Daily  ? primidone  100 mg Oral Daily  ? sodium chloride flush  3 mL Intravenous Q12H  ? ?Continuous Infusions: ? sodium chloride    ? ?PRN Meds: ?sodium chloride, acetaminophen, ondansetron (ZOFRAN) IV, sodium chloride flush  ? ?Vital Signs  ?  ?Vitals:  ? 09/19/21 0400 09/19/21 0600 09/19/21 0851 09/19/21 0936  ?BP: 97/73 102/80 119/81   ?Pulse: (!) 108 (!) 107 (!) 111 63  ?Resp: 18 17    ?Temp: 97.6 ?F (36.4 ?C)     ?TempSrc: Oral     ?SpO2: 91% 94%    ?Weight: 94.9 kg     ?Height:      ? ? ?Intake/Output Summary (Last 24 hours) at 09/19/2021 1057 ?Last data filed at 09/19/2021 0600 ?Gross per 24 hour  ?Intake 567 ml  ?Output 1475 ml  ?Net -908 ml  ? ? ?  09/19/2021  ?  4:00 AM 09/18/2021  ? 12:10 AM 09/17/2021  ?  3:47 PM  ?Last 3 Weights  ?Weight (lbs) 209 lb 3.2 oz 210 lb 5.1 oz 214 lb 8.1 oz  ?Weight (kg) 94.892 kg 95.4 kg 97.3 kg  ?   ? ?Telemetry  ?  ?Atrial flutter, 2:1. HR 110 - Personally Reviewed ? ?ECG  ?  ?No new tracings today - Personally Reviewed ? ?Physical Exam  ? ?GEN: No acute distress.   ?Neck: No JVD ?Cardiac: RRR, faint systolic murmur at apex. Radial pulses 2+ bilaterally  ?Respiratory: Fine crackles in bilateral lung bases  ?GI: Soft, nontender, non-distended  ?MS: No edema; No deformity. ?Neuro:  Nonfocal  ?Psych: Normal affect  ? ?Labs  ?  ?High Sensitivity Troponin:   ?Recent Labs  ?Lab 09/17/21 ?1035 09/17/21 ?1221  ?TROPONINIHS 15 13  ?    ?Chemistry ?Recent Labs  ?Lab 09/17/21 ?1035 09/18/21 ?0331 09/19/21 ?0411  ?NA 140 139 140  ?K 4.3 4.0 4.2  ?CL 107 106 106  ?CO2 26 29 26   ?GLUCOSE 87 87 86  ?BUN 30* 27* 32*  ?CREATININE 1.42* 1.49* 1.39*  ?CALCIUM 9.1 8.7* 8.8*  ?MG  --  2.2  --   ?PROT 6.7  --   --   ?ALBUMIN 3.6  --   --   ?AST 21  --   --   ?ALT 19  --   --   ?ALKPHOS 121  --   --   ?BILITOT 0.8  --   --   ?GFRNONAA 52* 49* 53*  ?ANIONGAP 7 4* 8  ?  ?Lipids No results for input(s): CHOL, TRIG, HDL, LABVLDL, LDLCALC, CHOLHDL in the last 168 hours.  ?Hematology ?Recent Labs  ?Lab 09/17/21 ?1035  ?WBC 5.6  ?RBC 4.57  ?HGB 11.6*  ?HCT 39.1  ?MCV 85.6  ?MCH 25.4*  ?MCHC 29.7*  ?RDW 18.0*  ?PLT 303  ? ?Thyroid  ?  Recent Labs  ?Lab 09/17/21 ?1905  ?TSH 5.397*  ?  ?BNP ?Recent Labs  ?Lab 09/17/21 ?1035 09/17/21 ?1905  ?BNP 1,214.9* 824.1*  ?  ?DDimer No results for input(s): DDIMER in the last 168 hours.  ? ?Radiology  ?  ?DG Chest 2 View ? ?Result Date: 09/17/2021 ?CLINICAL DATA:  Shortness of breath EXAM: CHEST - 2 VIEW COMPARISON:  03/07/2021 and prior studies FINDINGS: UPPER limits normal heart size noted. Interstitial opacities are present suggestive of mild interstitial edema. Trace bilateral pleural effusions are noted. There is no evidence of pneumothorax or acute bony abnormality. IMPRESSION: Interstitial opacities suggestive of mild interstitial edema. Trace bilateral pleural effusions. Electronically Signed   By: Margarette Canada M.D.   On: 09/17/2021 11:56  ? ?ECHOCARDIOGRAM LIMITED ? ?Result Date: 09/18/2021 ?   ECHOCARDIOGRAM LIMITED REPORT   Patient Name:   Timothy Munoz Date of Exam: 09/18/2021 Medical Rec #:  [id number]       Height:       75.0 in Accession #:    [id number]      Weight:       210.3 lb Date of Birth:  Nov 04, 1945       BSA:          2.242 m? Patient Age:    76 years        BP:           100/79 mmHg Patient Gender: M               HR:           110 bpm. Exam Location:  Inpatient Procedure: Limited Echo, 3D Echo, Color Doppler,  Cardiac Doppler and            Intracardiac Opacification Agent Indications:    CHF-Acute Systolic [id number]                 Mitral valve insufficiency I34.0  History:        Patient has prior history of Echocardiogram examinations, most                 recent 06/06/2021. Risk Factors:Hypertension and Dyslipidemia.  Sonographer:    Bernadene Person RDCS Referring Phys: [place]  1. Left ventricular ejection fraction by 3D volume is 26 %. The left ventricle has severely decreased function. The left ventricle demonstrates global hypokinesis. The left ventricular internal cavity size was moderately dilated.  2. Right ventricular systolic function is normal. The right ventricular size is normal. There is normal pulmonary artery systolic pressure. The estimated right ventricular systolic pressure is 123456 mmHg.  3. PISA radius 0.4 cm. The mitral valve is normal in structure. Moderate to severe mitral valve regurgitation. No evidence of mitral stenosis.  4. Tricuspid valve regurgitation is moderate.  5. The aortic valve is normal in structure. Aortic valve regurgitation is not visualized. No aortic stenosis is present.  6. Aortic dilatation noted. There is mild dilatation of the aortic root, measuring 43 mm.  7. The inferior vena cava is normal in size with greater than 50% respiratory variability, suggesting right atrial pressure of 3 mmHg. Comparison(s): Prior images reviewed side by side. Mitral regurgitation was previously trivial/ mild. Likely increased from overall reduced EF, dilation. FINDINGS  Left Ventricle: Left ventricular ejection fraction by 3D volume is 26 %. The left ventricle has severely decreased function. The left ventricle demonstrates global hypokinesis. Definity contrast agent was given IV to delineate the left ventricular endocardial borders. The  left ventricular internal cavity size was moderately dilated. There is no left ventricular hypertrophy. Right Ventricle: The right  ventricular size is normal. No increase in right ventricular wall thickness. Right ventricular systolic function is normal. There is normal pulmonary artery systolic pressure. The tricuspid regurgitant velocity is 2.66 m/s, and  with an assumed right atrial pressure of 3 mmHg, the estimated right ventricular systolic pressure is 31.3 mmHg. Left Atrium: Left atrial size was normal in size. Right Atrium: Right atrial size was normal in size. Pericardium: There is no evidence of pericardial effusion. Mitral Valve: PISA radius 0.4 cm. The mitral valve is normal in structure. There is mild thickening of the mitral valve leaflet(s). There is mild calcification of the mitral valve leaflet(s). Mild mitral annular calcification. Moderate to severe mitral valve regurgitation. No evidence of mitral valve stenosis. Tricuspid Valve: The tricuspid valve is normal in structure. Tricuspid valve regurgitation is moderate . No evidence of tricuspid stenosis. Aortic Valve: The aortic valve is normal in structure. Aortic valve regurgitation is not visualized. No aortic stenosis is present. Pulmonic Valve: The pulmonic valve was normal in structure. Pulmonic valve regurgitation is mild. No evidence of pulmonic stenosis. Aorta: Aortic dilatation noted. There is mild dilatation of the aortic root, measuring 43 mm. Venous: The inferior vena cava is normal in size with greater than 50% respiratory variability, suggesting right atrial pressure of 3 mmHg. IAS/Shunts: No atrial level shunt detected by color flow Doppler. LEFT VENTRICLE PLAX 2D LVIDd:         6.20 cm LVIDs:         5.50 cm LV PW:         0.80 cm         3D Volume EF LV IVS:        0.80 cm         LV 3D EF:    Left LVOT diam:     2.10 cm                      ventricul LV SV:         36                           ar LV SV Index:   16                           ejection LVOT Area:     3.46 cm?                     fraction                                             by 3D                                              volume is LV Volumes (MOD)                            26 %. LV vol d, MOD    184.0 ml A2C: LV vol d, MOD    187.0 ml  3D Volume EF: A4C:                           3D EF:

## 2021-09-20 ENCOUNTER — Inpatient Hospital Stay (HOSPITAL_COMMUNITY): Payer: Medicare HMO

## 2021-09-20 ENCOUNTER — Encounter (HOSPITAL_COMMUNITY)
Admission: EM | Disposition: A | Discharge status: Home / Self Care | Payer: Self-pay | Source: Home / Self Care | Attending: Internal Medicine

## 2021-09-20 ENCOUNTER — Encounter (HOSPITAL_COMMUNITY): Payer: Self-pay | Admitting: Internal Medicine

## 2021-09-20 ENCOUNTER — Inpatient Hospital Stay (HOSPITAL_COMMUNITY): Payer: Medicare HMO | Admitting: Certified Registered Nurse Anesthetist

## 2021-09-20 DIAGNOSIS — I5023 Acute on chronic systolic (congestive) heart failure: Secondary | ICD-10-CM | POA: Diagnosis not present

## 2021-09-20 DIAGNOSIS — I4891 Unspecified atrial fibrillation: Secondary | ICD-10-CM

## 2021-09-20 DIAGNOSIS — I2510 Atherosclerotic heart disease of native coronary artery without angina pectoris: Secondary | ICD-10-CM

## 2021-09-20 DIAGNOSIS — I340 Nonrheumatic mitral (valve) insufficiency: Secondary | ICD-10-CM

## 2021-09-20 DIAGNOSIS — I700 Atherosclerosis of aorta: Secondary | ICD-10-CM

## 2021-09-20 DIAGNOSIS — I484 Atypical atrial flutter: Secondary | ICD-10-CM | POA: Diagnosis not present

## 2021-09-20 HISTORY — PX: CARDIOVERSION: SHX1299

## 2021-09-20 HISTORY — PX: TEE WITHOUT CARDIOVERSION: SHX5443

## 2021-09-20 LAB — BASIC METABOLIC PANEL
Anion gap: 5 (ref 5–15)
BUN: 34 mg/dL — ABNORMAL HIGH (ref 8–23)
CO2: 30 mmol/L (ref 22–32)
Calcium: 8.8 mg/dL — ABNORMAL LOW (ref 8.9–10.3)
Chloride: 103 mmol/L (ref 98–111)
Creatinine, Ser: 1.46 mg/dL — ABNORMAL HIGH (ref 0.61–1.24)
GFR, Estimated: 50 mL/min — ABNORMAL LOW (ref >60)
Glucose, Bld: 87 mg/dL (ref 70–99)
Potassium: 4.1 mmol/L (ref 3.5–5.1)
Sodium: 138 mmol/L (ref 135–145)

## 2021-09-20 LAB — ECHO TEE
MV M vel: 3.43 m/s
MV Peak grad: 47.1 mmHg
Radius: 0.4 cm

## 2021-09-20 SURGERY — ECHOCARDIOGRAM, TRANSESOPHAGEAL
Anesthesia: Monitor Anesthesia Care

## 2021-09-20 MED ORDER — PHENYLEPHRINE 80 MCG/ML (10ML) SYRINGE FOR IV PUSH (FOR BLOOD PRESSURE SUPPORT)
PREFILLED_SYRINGE | INTRAVENOUS | Status: DC | PRN
  Administered 2021-09-20: 80 ug via INTRAVENOUS
  Administered 2021-09-20 (×2): 160 ug via INTRAVENOUS

## 2021-09-20 MED ORDER — PROPOFOL 10 MG/ML IV BOLUS
INTRAVENOUS | Status: DC | PRN
  Administered 2021-09-20 (×2): 10 mg via INTRAVENOUS

## 2021-09-20 MED ORDER — FUROSEMIDE 40 MG PO TABS
40.0000 mg | ORAL_TABLET | Freq: Every day | ORAL | Status: DC
  Administered 2021-09-21: 40 mg via ORAL
  Filled 2021-09-20: qty 1

## 2021-09-20 MED ORDER — FUROSEMIDE 10 MG/ML IJ SOLN
40.0000 mg | Freq: Two times a day (BID) | INTRAMUSCULAR | Status: AC
Start: 2021-09-20 — End: 2021-09-20
  Administered 2021-09-20: 40 mg via INTRAVENOUS
  Filled 2021-09-20: qty 4

## 2021-09-20 MED ORDER — PROPOFOL 500 MG/50ML IV EMUL
INTRAVENOUS | Status: DC | PRN
Start: 2021-09-20 — End: 2021-09-20
  Administered 2021-09-20: 100 ug/kg/min via INTRAVENOUS

## 2021-09-20 MED ORDER — LIDOCAINE 2% (20 MG/ML) 5 ML SYRINGE
INTRAMUSCULAR | Status: DC | PRN
  Administered 2021-09-20: 100 mg via INTRAVENOUS

## 2021-09-20 NOTE — Progress Notes (Signed)
Mobility Specialist Progress Note: ? ? 09/20/21 1030  ?Mobility  ?Activity Ambulated independently in hallway  ?Level of Assistance Independent  ?Assistive Device None  ?Distance Ambulated (ft) 470 ft  ?Activity Response Tolerated well  ?$Mobility charge 1 Mobility  ? ?Pt agreeable to mobility session. Required no physical assistance. Pt with slightly unsteady gait this am, able to self-correct. SpO2 >95% on RA. Back in bed with all needs met.  ? ?Nelta Numbers ?Acute Rehab ?Phone: [phone] ?Office Phone: [phone] ? ?

## 2021-09-20 NOTE — H&P (View-Only) (Signed)
? ?Progress Note ? ?Patient Name: Timothy Munoz ?Date of Encounter: 09/20/2021 ? ?[facility] Cardiologist: [name], MD  ? ?Subjective  ? ?Patient denies chest pain, palpitations, sob, orthopnea. Had a nose bleed this AM, spontaneously resolved.  ? ?Inpatient Medications  ?  ?Scheduled Meds: ? apixaban  5 mg Oral BID  ? aspirin EC  81 mg Oral Daily  ? atorvastatin  80 mg Oral Daily  ? clopidogrel  75 mg Oral Daily  ? dapagliflozin propanediol  10 mg Oral QAC breakfast  ? furosemide  40 mg Intravenous BID  ? metoprolol tartrate  25 mg Oral BID  ? potassium chloride  20 mEq Oral Daily  ? primidone  100 mg Oral Daily  ? sodium chloride flush  3 mL Intravenous Q12H  ? ?Continuous Infusions: ? sodium chloride    ? sodium chloride    ? ?PRN Meds: ?sodium chloride, acetaminophen, ondansetron (ZOFRAN) IV, polyethylene glycol, sodium chloride flush  ? ?Vital Signs  ?  ?Vitals:  ? 09/19/21 1917 09/20/21 0000 09/20/21 0200 09/20/21 0255  ?BP: 98/76 99/81 93/72  105/77  ?Pulse: (!) 104   (!) 109  ?Resp: 20   20  ?Temp: 98.1 ?F (36.7 ?C)   97.7 ?F (36.5 ?C)  ?TempSrc: Oral   Oral  ?SpO2: 95%   97%  ?Weight:    92.9 kg  ?Height:      ? ? ?Intake/Output Summary (Last 24 hours) at 09/20/2021 0944 ?Last data filed at 09/20/2021 0735 ?Gross per 24 hour  ?Intake 480 ml  ?Output 2625 ml  ?Net -2145 ml  ? ? ?  09/20/2021  ?  2:55 AM 09/19/2021  ?  4:00 AM 09/18/2021  ? 12:10 AM  ?Last 3 Weights  ?Weight (lbs) 204 lb 11.2 oz 209 lb 3.2 oz 210 lb 5.1 oz  ?Weight (kg) 92.851 kg 94.892 kg 95.4 kg  ?   ? ?Telemetry  ?  ?Predominantly 2:1 atrial flutter with a HR 110. When HR decreases, clearly atrial flutter - Personally Reviewed ? ?ECG  ?  ?No new tracings - Personally Reviewed ? ?Physical Exam  ? ?GEN: No acute distress.  Laying comfortably in the bed  ?Neck: No JVD ?Cardiac: RRR, no murmurs, rubs, or gallops.  ?Respiratory: Faint crackles in bilateral lung bases  ?GI: Soft, nontender, non-distended  ?MS: No edema; No deformity. ?Neuro:   Nonfocal  ?Psych: Normal affect  ? ?Labs  ?  ?High Sensitivity Troponin:   ?Recent Labs  ?Lab 09/17/21 ?1035 09/17/21 ?1221  ?TROPONINIHS 15 13  ?   ?Chemistry ?Recent Labs  ?Lab 09/17/21 ?1035 09/18/21 ?0331 09/19/21 ?0411 09/20/21 ?0409  ?NA 140 139 140 138  ?K 4.3 4.0 4.2 4.1  ?CL 107 106 106 103  ?CO2 26 29 26 30   ?GLUCOSE 87 87 86 87  ?BUN 30* 27* 32* 34*  ?CREATININE 1.42* 1.49* 1.39* 1.46*  ?CALCIUM 9.1 8.7* 8.8* 8.8*  ?MG  --  2.2  --   --   ?PROT 6.7  --   --   --   ?ALBUMIN 3.6  --   --   --   ?AST 21  --   --   --   ?ALT 19  --   --   --   ?ALKPHOS 121  --   --   --   ?BILITOT 0.8  --   --   --   ?GFRNONAA 52* 49* 53* 50*  ?ANIONGAP 7 4* 8 5  ?  ?  Lipids No results for input(s): CHOL, TRIG, HDL, LABVLDL, LDLCALC, CHOLHDL in the last 168 hours.  ?Hematology ?Recent Labs  ?Lab 09/17/21 ?1035  ?WBC 5.6  ?RBC 4.57  ?HGB 11.6*  ?HCT 39.1  ?MCV 85.6  ?MCH 25.4*  ?MCHC 29.7*  ?RDW 18.0*  ?PLT 303  ? ?Thyroid  ?Recent Labs  ?Lab 09/17/21 ?1905  ?TSH 5.397*  ?  ?BNP ?Recent Labs  ?Lab 09/17/21 ?1035 09/17/21 ?1905  ?BNP 1,214.9* 824.1*  ?  ?DDimer No results for input(s): DDIMER in the last 168 hours.  ? ?Radiology  ?  ?No results found. ? ?Cardiac Studies  ? ?Echocardiogram 09/18/21 ?1. Left ventricular ejection fraction by 3D volume is 26 %. The left  ?ventricle has severely decreased function. The left ventricle demonstrates  ?global hypokinesis. The left ventricular internal cavity size was  ?moderately dilated.  ? 2. Right ventricular systolic function is normal. The right ventricular  ?size is normal. There is normal pulmonary artery systolic pressure. The  ?estimated right ventricular systolic pressure is 31.3 mmHg.  ? 3. PISA radius 0.4 cm. The mitral valve is normal in structure. Moderate  ?to severe mitral valve regurgitation. No evidence of mitral stenosis.  ? 4. Tricuspid valve regurgitation is moderate.  ? 5. The aortic valve is normal in structure. Aortic valve regurgitation is  ?not visualized. No  aortic stenosis is present.  ? 6. Aortic dilatation noted. There is mild dilatation of the aortic root,  ?measuring 43 mm.  ? 7. The inferior vena cava is normal in size with greater than 50%  ?respiratory variability, suggesting right atrial pressure of 3 mmHg.  ? ?Comparison(s): Prior images reviewed side by side. Mitral regurgitation  ?was previously trivial/ mild. Likely increased from overall reduced EF,  ?dilation.  ? ?Patient Profile  ?   ?76 y.o. male with a history of coronary artery disease, coronary artery bypass grafting, hypertension, hyperlipidemia, obesity. He was admitted 5/7 with shortness of breath and diagnosed with worsening congestive heart failure ? ?Assessment & Plan  ?  ?Acute on chronic combined systolic and diastolic CHF  ?- Echo this admission showed EF 26%, decreased since 05/2021 (EF 35-40%)  ?- Patient has been on IV lasix 40 mg BID. Output 2.9 L urine yesterday and is currently net -6.1 L since admission. Creatinine stable. Give last dose of IV lasix this evening with transition to oral lasix tomorrow AM   ?- Patient has borderline low BP ?- Continue metoprolol 25 mg BID (also assists with HR control)  ?- Continue farxiga 10 mg daily  ?- Additional GDMT difficult due to low BP and renal function. Was on entresto PTA, but BP has been low and creatinine up. Suspect low BP is in part due to atrial flutter, may be able to restart after cardioversion today  ?  ?Atrial flutter  ?- Per telemetry, patient is maintaining a HR around 110 with 2:1 atrial flutter. HR occasionally slower, with clear atrial flutter waves present   ?- Scheduled for TEE guided cardioversion today  ?- Likely that uncontrolled atrial flutter could be contributing to his cardiomyopathy ?- Continue metoprolol 25 mg BID  ?- Continue eliquis  ?- EKG 5/8 AM showed QT/QTcB 408/554. QT prolongation not present on EKG from 5/9. Will recheck QT after cardioversion  ?  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 Now also on eliquis. Will confirm with MD that it is okay  to drop one of his antiplatelets as patient likely does not require triple therapy  ?- Continue metoprolol  ?- Continue lipitor 80 mg daily  ?  ?Moderate-severe MR  ?- Likely caused by increased dilation/reduced EF  ?- Follow as an outpatient, may eventually need evaluation for repair  ?  ?   ? ?For questions or updates, please contact [facility] ?Please consult [email or web address] for contact info under  ? ?  ?   ?Signed, ?Jonita Albee, PA-C  ?09/20/2021, 9:44 AM   ? ?Attending Note:  ? ?The patient was seen and examined.  Agree with assessment and plan as noted above.  Changes made to the above note as needed. ? ?Patient seen and independently examined with Robet Leu, PA.   We discussed all aspects of the encounter. I agree with the assessment and plan as stated above.  ? ? aTrial flutter:   remains tachycardic.   For TEE CV today . ?Will have him see EP as OP to consider flutter ablation  ? ?2. CAD :   no angian  ? ?3.  CHF :  should improve after cardioversion  ?Hope to restart [facility] following cardioversion  ? ? ? ? I have spent a total of 40 minutes with patient reviewing hospital  notes , telemetry, EKGs, labs and examining patient as well as establishing an assessment and plan that was discussed with the patient.  > 50% of time was spent in direct patient care. ? ? ? ?Vesta Mixer, Montez Hageman., MD, [facility] ?09/20/2021, 11:10 AM ?[address],  [address] ?Office - [phone] ?Pager [phone] ? ? ? ?

## 2021-09-20 NOTE — CV Procedure (Signed)
? ?  Transesophageal Echocardiogram ? ?Indications: Atrial fibrillation, mitral regurgitation evaluation ? ?Time out performed ? ?During this procedure the patient was administered propofol under anesthesiology supervision to achieve and maintain moderate sedation.  The patient's heart rate, blood pressure, and oxygen saturation are monitored continuously during the procedure.  ? ?Findings: ? ?Left Ventricle: Severely reduced ejection fraction 20 to 25% ? ?Mitral Valve: Mildly degenerative mitral valve with moderate mitral regurgitation, PISA radius 0.4 cm, no evidence of pulmonary vein flow reversal.  Regurgitation likely secondary to mal coaptation in the setting of reduced ejection fraction likely result of ongoing atrial fibrillation/tachycardia mediated in part ? ?Aortic Valve: Trileaflet, no regurgitation ? ?Tricuspid Valve: Trace TR ? ?Left Atrium: Mildly dilated, no left atrial appendage thrombus ? ?Right Atrium: Mildly dilated ? ?Intraatrial septum: No evidence of shunt ? ?Bubble Contrast Study: Not performed ? ?Aorta: Mild atherosclerosis ? ?[name], MD ? ? ? ? ?Electrical Cardioversion Procedure Note ?Timothy Munoz ?[id number] ?[birth year] ? ?Procedure: Electrical Cardioversion ?Indications:  Atrial Fibrillation ? ?Time Out: Verified patient identification, verified procedure,medications/allergies/relevent history reviewed, required imaging and test results available.  Performed ? ?Procedure Details ? ?The patient was NPO after midnight. Anesthesia was administered at the beside  by Dr.Witman with propofol.  Cardioversion was performed with synchronized biphasic defibrillation via AP pads with 200 joules.  1 attempt(s) were performed.  The patient converted to normal sinus rhythm. The patient tolerated the procedure well  ? ?IMPRESSION: ? ?Successful cardioversion of atrial fibrillation following transesophageal echocardiogram ? ? ? ?[name] ?09/20/2021, 1:34 PM ? ?  ?  ?

## 2021-09-20 NOTE — Interval H&P Note (Signed)
History and Physical Interval Note: ? ?09/20/2021 ?12:38 PM ? ?[name]  has presented today for surgery, with the diagnosis of atrail fibrillation.  The various methods of treatment have been discussed with the patient and family. After consideration of risks, benefits and other options for treatment, the patient has consented to  Procedure(s): ?TRANSESOPHAGEAL ECHOCARDIOGRAM (TEE) (N/A) ?CARDIOVERSION (N/A) as a surgical intervention.  The patient's history has been reviewed, patient examined, no change in status, stable for surgery.  I have reviewed the patient's chart and labs.  Questions were answered to the patient's satisfaction.   ? ? ?Timothy Munoz ? ? ?

## 2021-09-20 NOTE — Plan of Care (Signed)

## 2021-09-20 NOTE — Anesthesia Procedure Notes (Signed)
Procedure Name: General with mask airway ?Date/Time: 09/20/2021 1:05 PM ?Performed by: Lelon Perla, CRNA ?Pre-anesthesia Checklist: Timeout performed, Patient being monitored, Suction available, Emergency Drugs available and Patient identified ?Patient Re-evaluated:Patient Re-evaluated prior to induction ?Oxygen Delivery Method: Nasal cannula ?Preoxygenation: Pre-oxygenation with 100% oxygen ?Induction Type: IV induction ?Placement Confirmation: positive ETCO2 and CO2 detector ?Dental Injury: Teeth and Oropharynx as per pre-operative assessment  ? ? ? ? ?

## 2021-09-20 NOTE — Progress Notes (Signed)
Mobility Specialist Progress Note: ? ? 09/20/21 1645  ?Mobility  ?Activity Ambulated independently in hallway  ?Level of Assistance Independent  ?Assistive Device None  ?Distance Ambulated (ft) 470 ft  ?Activity Response Tolerated well  ?$Mobility charge 1 Mobility  ? ?Pt agreeable to second mobility session this afternoon. No physical assistance required. Still with minor unsteady gait. Pt back in bed with all needs met, ordering food now.  ? ?Nelta Numbers ?Acute Rehab ?Phone: [phone] ?Office Phone: [phone] ? ?

## 2021-09-20 NOTE — Progress Notes (Incomplete)
Heart Failure Stewardship Pharmacist Progress Note ? ? ?PCP: Orpah Melter, MD ?PCP-Cardiologist: Shelva Majestic, MD  ? ? ?HPI:  ?76 yo male with PMH of CAD, STEMI s/p three-vessel CABG in 01/2021, HTN, HLD, obesity. LVEF of 40-45% unchanged prior to and after CABG on 01/19/2021. Repeat ECHO in 06/06/2021 showed reduced LVEF of 35-40% with mild aortic calcification, moderate-severe mitral valve regurgitation, moderately elevated pulmonary pressures, and grade II diastolic dysfunction. Wilder Glade was reduced to 5 mg daily on 06/16/2021 after slight SCr increase (1.45 > 1.58). Repeat SCr 1.43, Entresto 24-26 mg twice daily was added 04/14 and repeat SCr continued to trend down to 1.34. Farxiga 5 mg continued.  ? ?Presented 05/07 to ED with SOB for 1 month and endorsed 5lb weight gain. CXR with mild interstitial edema and trace bilateral pleural effusions. ECHO this admission revealed severely reduced LV function with EF 26% and moderate-severe MVR. Of note, admission complicated by A flutter and tachycardia in 100s - carvedilol switched to metoprolol tartrate for additional rate control. Due for TEE/DCCV on 05/10. ? ?Current HF Medications: ?Diuretic: furosemide 40 mg IV twice daily ?Beta Blocker: metoprolol 25 mg twice daily ?SGLT2i: dapagliflozin 10 mg daily ?Other: potassium 20 mEq daily ? ?Prior to admission HF Medications: ?Diuretic: furosemide 20 mg daily ?Beta blocker: carvedilol 6.25 mg twice daily ?ACE/ARB/ARNI: Delene Loll 24-26 mg twice daily ?SGLT2i: dapagliflozin 5 mg daily  ? ?Pertinent Lab Values: ?Serum creatinine 1.46 (up), BUN 34 (up), Potassium 4.1, Sodium 138, BNP 824 (05/07), Magnesium 2.2, A1c 6.0% (01/16/2021)  ? ?Vital Signs: ?Weight: 204 lbs (admission weight: 214 lbs) ?Blood pressure: labile - 90-120s/70s ?Heart rate: 100-110s  ?I/O: -2.2L yesterday; net -8.17L ? ?Medication Assistance / Insurance Benefits Check: ?Does the patient have prescription insurance?  Yes ?Type of insurance plan:  Medicare ? ? ?Outpatient Pharmacy:  ?Prior to admission outpatient pharmacy: [place], [place] ?Is the patient willing to use [place] pharmacy at discharge? Pending ?Is the patient willing to transition their outpatient pharmacy to utilize a [facility] outpatient pharmacy?   Pending ?  ? ?Assessment: ?1. Acute on chronic combined systolic and diastolic CHF (LVEF 99991111), reduced since CABG in September 2022. NYHA class III symptoms. ?He is still remains tachycardic despite beta-blocker. ?- Continue diuresis with furosemide IV 40 mg twice daily ?- Continue metoprolol tartrate 25 mg twice daily ?- Continue dapagliflozin 10 mg daily ?- PTA Entresto on hold for acute on chronic kidney injury and soft BP ?- No history of MRA ?- No digoxin with age, labile renal function, RV okay ?- Could consider ivabradine with persistent tachycardia in the 100s but in A flutter ?  ?Plan: ?1) Medication changes recommended at this time: ?- Start spironolactone 12.5 mg once daily ?- Consider restarting PTA Entresto pending BP and renal function after cardioversion Wednesday  ? ?2) Patient assistance: ?- Pending ? ?3)  Education  ?- To be completed prior to discharge ? ?Laurey Arrow, PharmD ?PGY1 Pharmacy Resident ?09/20/2021  10:51 AM ? ?Kerby Nora, PharmD, BCPS ?Heart Failure Stewardship Pharmacist ?Phone [phone] ?

## 2021-09-20 NOTE — Plan of Care (Signed)
?  Problem: Health Behavior/Discharge Planning: ?Goal: Ability to manage health-related needs will improve ?Outcome: Progressing ?  ?Problem: Clinical Measurements: ?Goal: Will remain free from infection ?Outcome: Progressing ?Goal: Cardiovascular complication will be avoided ?Outcome: Progressing ?  ?Problem: Activity: ?Goal: Risk for activity intolerance will decrease ?Outcome: Progressing ?  ?Problem: Nutrition: ?Goal: Adequate nutrition will be maintained ?Outcome: Progressing ?  ?Problem: Coping: ?Goal: Level of anxiety will decrease ?Outcome: Progressing ?  ?Problem: Elimination: ?Goal: Will not experience complications related to urinary retention ?Outcome: Progressing ?  ?Problem: Pain Managment: ?Goal: General experience of comfort will improve ?Outcome: Progressing ?  ?Problem: Safety: ?Goal: Ability to remain free from injury will improve ?Outcome: Progressing ?  ?

## 2021-09-20 NOTE — Progress Notes (Signed)
Heart Failure Navigator Progress Note ? ?Assessed for Heart & Vascular TOC clinic readiness.  ?Patient has a follow up wit Dr. [name] on 09/27/2021. .  ? ?Navigator available for reassessment of patient.  ? ?Rhae Hammock, BSN, RN ?Heart Failure Nurse Navigator ?Secure Chat Only   ?

## 2021-09-20 NOTE — TOC Progression Note (Signed)
Transition of Care (TOC) - Progression Note  ? ? ?Patient Details  ?Name: Timothy Munoz ?MRN: [id number] ?Date of Birth: 1945-08-09 ? ?Transition of Care (TOC) CM/SW Contact  ?Zenon Mayo, RN ?Phone Number: ?09/20/2021, 4:14 PM ? ?Clinical Narrative:    ?HF, cardioversion today,  will change to po lasix tomorrow. TOC will continue to follow for dc needs. ? ? ?  ?  ? ?Expected Discharge Plan and Services ?  ?  ?  ?  ?  ?                ?  ?  ?  ?  ?  ?  ?  ?  ?  ?  ? ? ?Social Determinants of Health (SDOH) Interventions ?  ? ?Readmission Risk Interventions ?   ? View : No data to display.  ?  ?  ?  ? ? ?

## 2021-09-20 NOTE — Progress Notes (Signed)
?  Echocardiogram ?Echocardiogram Transesophageal has been performed. ? Timothy Munoz ?09/20/2021, 1:47 PM ?

## 2021-09-20 NOTE — Transfer of Care (Signed)
Immediate Anesthesia Transfer of Care Note ? ?Patient: SOLAN VOSLER ? ?Procedure(s) Performed: TRANSESOPHAGEAL ECHOCARDIOGRAM (TEE) ?CARDIOVERSION ? ?Patient Location: Endoscopy Unit ? ?Anesthesia Type:General ? ?Level of Consciousness: awake and drowsy ? ?Airway & Oxygen Therapy: Patient Spontanous Breathing and Patient connected to nasal cannula oxygen ? ?Post-op Assessment: Report given to RN and Post -op Vital signs reviewed and stable ? ?Post vital signs: Reviewed and stable ? ?Last Vitals:  ?Vitals Value Taken Time  ?BP 84/62 09/20/21 1331  ?Temp    ?Pulse 63 09/20/21 1331  ?Resp 22 09/20/21 1331  ?SpO2 100 % 09/20/21 1331  ?Vitals shown include unvalidated device data. ? ?Last Pain:  ?Vitals:  ? 09/20/21 1217  ?TempSrc: Oral  ?PainSc: 0-No pain  ?   ? ?  ? ?Complications: No notable events documented. ?

## 2021-09-20 NOTE — Anesthesia Preprocedure Evaluation (Signed)
Anesthesia Evaluation  ?Patient identified by MRN, date of birth, ID band ?Patient awake ? ? ? ?Reviewed: ?Allergy & Precautions, NPO status , Patient's Chart, lab work & pertinent test results ? ?History of Anesthesia Complications ?Negative for: history of anesthetic complications ? ?Airway ?Mallampati: II ? ?TM Distance: >3 FB ?Neck ROM: Full ? ? ? Dental ?  ?Pulmonary ?neg pulmonary ROS, former smoker,  ?  ?Pulmonary exam normal ? ? ? ? ? ? ? Cardiovascular ?hypertension, Pt. on medications and Pt. on home beta blockers ?+ CAD, + Past MI, + CABG (01/20/21) and +CHF  ?+ dysrhythmias Atrial Fibrillation  ?Rhythm:Irregular Rate:Tachycardia ? ? ?Echo 09/18/21: EF 26%, global hypokinesis, normal RVSF, normal PASP, mod-severe MR, mod TR, no AS, aortic root 43 ?  ?Neuro/Psych ?negative neurological ROS ?   ? GI/Hepatic ?negative GI ROS, Neg liver ROS,   ?Endo/Other  ?negative endocrine ROS ? Renal/GU ?Renal disease  ?negative genitourinary ?  ?Musculoskeletal ?negative musculoskeletal ROS ?(+)  ? Abdominal ?  ?Peds ? Hematology ?negative hematology ROS ?(+)   ?Anesthesia Other Findings ? ? Reproductive/Obstetrics ? ?  ? ? ? ? ? ? ? ? ? ? ? ? ? ?  ?  ? ? ? ? ? ? ? ? ?Anesthesia Physical ?Anesthesia Plan ? ?ASA: 4 ? ?Anesthesia Plan: MAC  ? ?Post-op Pain Management: Minimal or no pain anticipated  ? ?Induction: Intravenous ? ?PONV Risk Score and Plan: 1 and Propofol infusion, TIVA and Treatment may vary due to age or medical condition ? ?Airway Management Planned: Natural Airway, Nasal Cannula and Simple Face Mask ? ?Additional Equipment: None ? ?Intra-op Plan:  ? ?Post-operative Plan:  ? ?Informed Consent: I have reviewed the patients History and Physical, chart, labs and discussed the procedure including the risks, benefits and alternatives for the proposed anesthesia with the patient or authorized representative who has indicated his/her understanding and acceptance.  ? ? ? ? ? ?Plan  Discussed with:  ? ?Anesthesia Plan Comments:   ? ? ? ? ? ? ?Anesthesia Quick Evaluation ? ?

## 2021-09-20 NOTE — Anesthesia Postprocedure Evaluation (Signed)
Anesthesia Post Note ? ?Patient: Timothy Munoz ? ?Procedure(s) Performed: TRANSESOPHAGEAL ECHOCARDIOGRAM (TEE) ?CARDIOVERSION ? ?  ? ?Patient location during evaluation: Endoscopy ?Anesthesia Type: MAC ?Level of consciousness: awake and alert ?Pain management: pain level controlled ?Vital Signs Assessment: post-procedure vital signs reviewed and stable ?Respiratory status: spontaneous breathing, nonlabored ventilation and respiratory function stable ?Cardiovascular status: blood pressure returned to baseline and stable ?Postop Assessment: no apparent nausea or vomiting ?Anesthetic complications: no ? ? ?No notable events documented. ? ?Last Vitals:  ?Vitals:  ? 09/20/21 1400 09/20/21 1410  ?BP: 91/70 99/76  ?Pulse: 72 77  ?Resp: (!) 21 20  ?Temp:    ?SpO2: 97% 97%  ?  ?Last Pain:  ?Vitals:  ? 09/20/21 1400  ?TempSrc:   ?PainSc: 0-No pain  ? ? ?  ?  ?  ?  ?  ?  ? ?Lidia Collum ? ? ? ? ?

## 2021-09-20 NOTE — Progress Notes (Addendum)
? ?Progress Note ? ?Patient Name: Timothy Munoz ?Date of Encounter: 09/20/2021 ? ?[facility] Cardiologist: [name], MD  ? ?Subjective  ? ?Patient denies chest pain, palpitations, sob, orthopnea. Had a nose bleed this AM, spontaneously resolved.  ? ?Inpatient Medications  ?  ?Scheduled Meds: ? apixaban  5 mg Oral BID  ? aspirin EC  81 mg Oral Daily  ? atorvastatin  80 mg Oral Daily  ? clopidogrel  75 mg Oral Daily  ? dapagliflozin propanediol  10 mg Oral QAC breakfast  ? furosemide  40 mg Intravenous BID  ? metoprolol tartrate  25 mg Oral BID  ? potassium chloride  20 mEq Oral Daily  ? primidone  100 mg Oral Daily  ? sodium chloride flush  3 mL Intravenous Q12H  ? ?Continuous Infusions: ? sodium chloride    ? sodium chloride    ? ?PRN Meds: ?sodium chloride, acetaminophen, ondansetron (ZOFRAN) IV, polyethylene glycol, sodium chloride flush  ? ?Vital Signs  ?  ?Vitals:  ? 09/19/21 1917 09/20/21 0000 09/20/21 0200 09/20/21 0255  ?BP: 98/76 99/81 93/72  105/77  ?Pulse: (!) 104   (!) 109  ?Resp: 20   20  ?Temp: 98.1 ?F (36.7 ?C)   97.7 ?F (36.5 ?C)  ?TempSrc: Oral   Oral  ?SpO2: 95%   97%  ?Weight:    92.9 kg  ?Height:      ? ? ?Intake/Output Summary (Last 24 hours) at 09/20/2021 0944 ?Last data filed at 09/20/2021 0735 ?Gross per 24 hour  ?Intake 480 ml  ?Output 2625 ml  ?Net -2145 ml  ? ? ?  09/20/2021  ?  2:55 AM 09/19/2021  ?  4:00 AM 09/18/2021  ? 12:10 AM  ?Last 3 Weights  ?Weight (lbs) 204 lb 11.2 oz 209 lb 3.2 oz 210 lb 5.1 oz  ?Weight (kg) 92.851 kg 94.892 kg 95.4 kg  ?   ? ?Telemetry  ?  ?Predominantly 2:1 atrial flutter with a HR 110. When HR decreases, clearly atrial flutter - Personally Reviewed ? ?ECG  ?  ?No new tracings - Personally Reviewed ? ?Physical Exam  ? ?GEN: No acute distress.  Laying comfortably in the bed  ?Neck: No JVD ?Cardiac: RRR, no murmurs, rubs, or gallops.  ?Respiratory: Faint crackles in bilateral lung bases  ?GI: Soft, nontender, non-distended  ?MS: No edema; No deformity. ?Neuro:   Nonfocal  ?Psych: Normal affect  ? ?Labs  ?  ?High Sensitivity Troponin:   ?Recent Labs  ?Lab 09/17/21 ?1035 09/17/21 ?1221  ?TROPONINIHS 15 13  ?   ?Chemistry ?Recent Labs  ?Lab 09/17/21 ?1035 09/18/21 ?0331 09/19/21 ?0411 09/20/21 ?0409  ?NA 140 139 140 138  ?K 4.3 4.0 4.2 4.1  ?CL 107 106 106 103  ?CO2 26 29 26 30   ?GLUCOSE 87 87 86 87  ?BUN 30* 27* 32* 34*  ?CREATININE 1.42* 1.49* 1.39* 1.46*  ?CALCIUM 9.1 8.7* 8.8* 8.8*  ?MG  --  2.2  --   --   ?PROT 6.7  --   --   --   ?ALBUMIN 3.6  --   --   --   ?AST 21  --   --   --   ?ALT 19  --   --   --   ?ALKPHOS 121  --   --   --   ?BILITOT 0.8  --   --   --   ?GFRNONAA 52* 49* 53* 50*  ?ANIONGAP 7 4* 8 5  ?  ?  Lipids No results for input(s): CHOL, TRIG, HDL, LABVLDL, LDLCALC, CHOLHDL in the last 168 hours.  ?Hematology ?Recent Labs  ?Lab 09/17/21 ?1035  ?WBC 5.6  ?RBC 4.57  ?HGB 11.6*  ?HCT 39.1  ?MCV 85.6  ?MCH 25.4*  ?MCHC 29.7*  ?RDW 18.0*  ?PLT 303  ? ?Thyroid  ?Recent Labs  ?Lab 09/17/21 ?1905  ?TSH 5.397*  ?  ?BNP ?Recent Labs  ?Lab 09/17/21 ?1035 09/17/21 ?1905  ?BNP 1,214.9* 824.1*  ?  ?DDimer No results for input(s): DDIMER in the last 168 hours.  ? ?Radiology  ?  ?No results found. ? ?Cardiac Studies  ? ?Echocardiogram 09/18/21 ?1. Left ventricular ejection fraction by 3D volume is 26 %. The left  ?ventricle has severely decreased function. The left ventricle demonstrates  ?global hypokinesis. The left ventricular internal cavity size was  ?moderately dilated.  ? 2. Right ventricular systolic function is normal. The right ventricular  ?size is normal. There is normal pulmonary artery systolic pressure. The  ?estimated right ventricular systolic pressure is 31.3 mmHg.  ? 3. PISA radius 0.4 cm. The mitral valve is normal in structure. Moderate  ?to severe mitral valve regurgitation. No evidence of mitral stenosis.  ? 4. Tricuspid valve regurgitation is moderate.  ? 5. The aortic valve is normal in structure. Aortic valve regurgitation is  ?not visualized. No  aortic stenosis is present.  ? 6. Aortic dilatation noted. There is mild dilatation of the aortic root,  ?measuring 43 mm.  ? 7. The inferior vena cava is normal in size with greater than 50%  ?respiratory variability, suggesting right atrial pressure of 3 mmHg.  ? ?Comparison(s): Prior images reviewed side by side. Mitral regurgitation  ?was previously trivial/ mild. Likely increased from overall reduced EF,  ?dilation.  ? ?Patient Profile  ?   ?76 y.o. male with a history of coronary artery disease, coronary artery bypass grafting, hypertension, hyperlipidemia, obesity. He was admitted 5/7 with shortness of breath and diagnosed with worsening congestive heart failure ? ?Assessment & Plan  ?  ?Acute on chronic combined systolic and diastolic CHF  ?- Echo this admission showed EF 26%, decreased since 05/2021 (EF 35-40%)  ?- Patient has been on IV lasix 40 mg BID. Output 2.9 L urine yesterday and is currently net -6.1 L since admission. Creatinine stable. Give last dose of IV lasix this evening with transition to oral lasix tomorrow AM   ?- Patient has borderline low BP ?- Continue metoprolol 25 mg BID (also assists with HR control)  ?- Continue farxiga 10 mg daily  ?- Additional GDMT difficult due to low BP and renal function. Was on entresto PTA, but BP has been low and creatinine up. Suspect low BP is in part due to atrial flutter, may be able to restart after cardioversion today  ?  ?Atrial flutter  ?- Per telemetry, patient is maintaining a HR around 110 with 2:1 atrial flutter. HR occasionally slower, with clear atrial flutter waves present   ?- Scheduled for TEE guided cardioversion today  ?- Likely that uncontrolled atrial flutter could be contributing to his cardiomyopathy ?- Continue metoprolol 25 mg BID  ?- Continue eliquis  ?- EKG 5/8 AM showed QT/QTcB 408/554. QT prolongation not present on EKG from 5/9. Will recheck QT after cardioversion  ?  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 Now also on eliquis. Will confirm with MD that it is okay   to drop one of his antiplatelets as patient likely does not require triple therapy  ?- Continue metoprolol  ?- Continue lipitor 80 mg daily  ?  ?Moderate-severe MR  ?- Likely caused by increased dilation/reduced EF  ?- Follow as an outpatient, may eventually need evaluation for repair  ?  ?   ? ?For questions or updates, please contact [facility] ?Please consult [email or web address] for contact info under  ? ?  ?   ?Signed, ?Jonita Albee, PA-C  ?09/20/2021, 9:44 AM   ? ?Attending Note:  ? ?The patient was seen and examined.  Agree with assessment and plan as noted above.  Changes made to the above note as needed. ? ?Patient seen and independently examined with Robet Leu, PA.   We discussed all aspects of the encounter. I agree with the assessment and plan as stated above.  ? ? aTrial flutter:   remains tachycardic.   For TEE CV today . ?Will have him see EP as OP to consider flutter ablation  ? ?2. CAD :   no angian  ? ?3.  CHF :  should improve after cardioversion  ?Hope to restart [facility] following cardioversion  ? ? ? ? I have spent a total of 40 minutes with patient reviewing hospital  notes , telemetry, EKGs, labs and examining patient as well as establishing an assessment and plan that was discussed with the patient.  > 50% of time was spent in direct patient care. ? ? ? ?Vesta Mixer, Montez Hageman., MD, [facility] ?09/20/2021, 11:10 AM ?[address],  [address] ?Office - [phone] ?Pager [phone] ? ? ? ?

## 2021-09-21 ENCOUNTER — Other Ambulatory Visit (HOSPITAL_COMMUNITY): Payer: Self-pay

## 2021-09-21 ENCOUNTER — Encounter (HOSPITAL_COMMUNITY): Payer: Self-pay | Admitting: Cardiology

## 2021-09-21 DIAGNOSIS — I4892 Unspecified atrial flutter: Secondary | ICD-10-CM

## 2021-09-21 DIAGNOSIS — I484 Atypical atrial flutter: Secondary | ICD-10-CM | POA: Diagnosis not present

## 2021-09-21 DIAGNOSIS — I5023 Acute on chronic systolic (congestive) heart failure: Secondary | ICD-10-CM | POA: Diagnosis not present

## 2021-09-21 MED ORDER — POTASSIUM CHLORIDE CRYS ER 20 MEQ PO TBCR: 20.0000 meq | EXTENDED_RELEASE_TABLET | Freq: Every day | ORAL | Status: DC | PRN

## 2021-09-21 MED ORDER — APIXABAN 5 MG PO TABS
5.0000 mg | ORAL_TABLET | Freq: Two times a day (BID) | ORAL | 3 refills | Status: DC
  Filled 2021-09-21: qty 60, 30d supply, fill #0

## 2021-09-21 MED ORDER — CARVEDILOL 6.25 MG PO TABS: 6.2500 mg | ORAL_TABLET | Freq: Two times a day (BID) | ORAL | Status: DC

## 2021-09-21 MED ORDER — FUROSEMIDE 40 MG PO TABS: 40.0000 mg | ORAL_TABLET | Freq: Every day | ORAL | Status: DC | PRN

## 2021-09-21 MED ORDER — POTASSIUM CHLORIDE CRYS ER 20 MEQ PO TBCR
20.0000 meq | EXTENDED_RELEASE_TABLET | Freq: Every day | ORAL | 3 refills | Status: DC | PRN
Start: 2021-09-21 — End: 2022-03-12
  Filled 2021-09-21: qty 90, 90d supply, fill #0

## 2021-09-21 MED ORDER — ENTRESTO 24-26 MG PO TABS
1.0000 | ORAL_TABLET | Freq: Two times a day (BID) | ORAL | 3 refills | Status: DC
  Filled 2021-09-21: qty 60, 30d supply, fill #0

## 2021-09-21 MED ORDER — SACUBITRIL-VALSARTAN 24-26 MG PO TABS: 1.0000 | ORAL_TABLET | Freq: Two times a day (BID) | ORAL | Status: DC

## 2021-09-21 MED ORDER — FUROSEMIDE 40 MG PO TABS
40.0000 mg | ORAL_TABLET | Freq: Every day | ORAL | 3 refills | Status: DC | PRN
  Filled 2021-09-21: qty 90, 90d supply, fill #0

## 2021-09-21 NOTE — Progress Notes (Signed)
Called [facility] pharmacy and person stated it is okay for patient to pick up medications downstairs on the way out the hospital.  ?Patient called his ride and the person is meeting him outside in front of the building.  ?Pt is alert and oriented x 4, ambulated in the hallway independently prior to discharge and took all belongings.  ?Denied pain and showed no signs nor symptoms of discomfort. ? ?Education completed. Care plan resolved. ?

## 2021-09-21 NOTE — Progress Notes (Signed)
Mobility Specialist Progress Note: ? ? 09/21/21 0920  ?Mobility  ?Activity Ambulated independently in hallway  ?Level of Assistance Independent  ?Assistive Device None  ?Distance Ambulated (ft) 470 ft  ?Activity Response Tolerated well  ?$Mobility charge 1 Mobility  ? ?Pt eager for mobility session. Required no physical assistance. Pt with increased steadiness this am. SpO2 >93% throughout. Eager for d/c, back in bed with all needs met.  ? ?Nelta Numbers ?Acute Rehab ?Phone: [phone] ?Office Phone: [phone] ? ?

## 2021-09-21 NOTE — Progress Notes (Signed)
Dr. Elease Hashimoto ?Spoke with patient about his care in the hospital and his follow up appointment after discharge. ? ?

## 2021-09-21 NOTE — TOC Transition Note (Signed)
Transition of Care (TOC) - CM/SW Discharge Note ? ? ?Patient Details  ?Name: Timothy Munoz ?MRN: [id number] ?Date of Birth: [birth year] ? ?Transition of Care (TOC) CM/SW Contact:  ?Leone Haven, RN ?Phone Number: ?09/21/2021, 1:10 PM ? ? ?Clinical Narrative:    ?Patient is for dc today, he has no needs. TOC to fill medications.  ? ? ?  ?  ? ? ?Patient Goals and CMS Choice ?  ?  ?  ? ?Discharge Placement ?  ?           ?  ?  ?  ?  ? ?Discharge Plan and Services ?  ?  ?           ?  ?  ?  ?  ?  ?  ?  ?  ?  ?  ? ?Social Determinants of Health (SDOH) Interventions ?  ? ? ?Readmission Risk Interventions ?   ? View : No data to display.  ?  ?  ?  ? ? ? ? ? ?

## 2021-09-21 NOTE — Discharge Summary (Addendum)
?Discharge Summary  ?  ?Patient ID: Timothy Munoz ?MRN: [id number]; DOB: June 08, 1945 ? ?Admit date: 09/17/2021 ?Discharge date: 09/21/2021 ? ?PCP:  Orpah Melter, MD ?  ?[facility] Providers ?Cardiologist:  Shelva Majestic, MD    ? ? ?Discharge Diagnoses  ?  ?Principal Problem: ?  Acute on chronic HFrEF (heart failure with reduced ejection fraction) ([place]) ?Active Problems: ?  HTN (hypertension) ?  Mild hyperlipidemia ?  S/P CABG x 3 ?  Coronary artery disease ?  Atrial flutter ([place]) ? ? ? ?Diagnostic Studies/Procedures  ?  ?Echocardiogram 09/18/21  ? 1. Left ventricular ejection fraction by 3D volume is 26 %. The left  ?ventricle has severely decreased function. The left ventricle demonstrates  ?global hypokinesis. The left ventricular internal cavity size was  ?moderately dilated.  ? 2. Right ventricular systolic function is normal. The right ventricular  ?size is normal. There is normal pulmonary artery systolic pressure. The  ?estimated right ventricular systolic pressure is 123456 mmHg.  ? 3. PISA radius 0.4 cm. The mitral valve is normal in structure. Moderate  ?to severe mitral valve regurgitation. No evidence of mitral stenosis.  ? 4. Tricuspid valve regurgitation is moderate.  ? 5. The aortic valve is normal in structure. Aortic valve regurgitation is  ?not visualized. No aortic stenosis is present.  ? 6. Aortic dilatation noted. There is mild dilatation of the aortic root,  ?measuring 43 mm.  ? 7. The inferior vena cava is normal in size with greater than 50%  ?respiratory variability, suggesting right atrial pressure of 3 mmHg.  ? ?Comparison(s): Prior images reviewed side by side. Mitral regurgitation  ?was previously trivial/ mild. Likely increased from overall reduced EF,  ?dilation.  ?  ?Tee 09/20/21 ?1. Left ventricular ejection fraction, by estimation, is 20 to 25%. The  ?left ventricle has severely decreased function. The left ventricle has no  ?regional wall motion abnormalities.  ? 2. Right  ventricular systolic function is normal. The right ventricular  ?size is normal.  ? 3. No left atrial/left atrial appendage thrombus was detected.  ? 4. PISA radius 0.4 cm  ?    Annular diameter 3.07 cm  ?    Mal Coaptation 0.26 cm  ?    MR ERO 0.13 cm2  ?    MR Vol 12 ml  ?    Normal pulmonary vein flow. The mitral valve is degenerative. Moderate  ?mitral valve regurgitation. No evidence of mitral stenosis.  ? 5. The aortic valve is normal in structure. Aortic valve regurgitation is  ?not visualized. No aortic stenosis is present.  ? 6. There is mild (Grade II) plaque.  ? 7. The inferior vena cava is normal in size with greater than 50%  ?respiratory variability, suggesting right atrial pressure of 3 mmHg.  ?_____________ ?  ?History of Present Illness   ?  ?Timothy Munoz is a 76 y.o. 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 ? ?Timothy Munoz is a 76 y.o. male with a hx of CAD s/p CABG x 3 on 01/20/2021, hypertension, hyperlipidemia and obesity.  In 01/2021, Patient had anterolateral STEMI with balloon angioplasty of ostial LAD.  Hospital course was complicated by cardiogenic shock.  EF 35%.  He required intra-aortic balloon pump for support.  Patient ultimately underwent CABG x3 on 01/20/2021.  He did have postop atrial fibrillation and was placed on amiodarone and digoxin.  Echocardiogram obtained on 01/25/2021 showed EF 40 to 45%, grade 2 DD, normal RV, no significant valve issue. At follow up, he was volume overloaded and was treated with diuretic therapy.  Patient was seen by Coletta Memos on 05/26/2021, and at that appointment his Lasix was changed to PRN dosing.  Amiodarone was stopped.  He was given a prescription for sildenafil 20 mg.  Repeat echocardiogram obtained on 06/06/2021 demonstrated EF 35 to 40%,  grade 2 DD, moderate elevated pulmonary artery systolic pressure with RVSP 53.4 mmHg, biatrial moderate enlargement, moderate to severe MR.  He was started on Farxiga, Farxiga dose was reduced by half due to elevation of the creatinine. Saw Almyra Deforest PA in follow up in April 2023. He endorsed SOB on exertion. He was start on entresto. He was recommended to take lasix 20 mg daily for 5 days then switch to PRN. He remained SOB. He presented to the ER on 5/7 with dyspnea with mild exertion. Reported ~5 pound weight gain (99kg; was 117 kg when he left the hospital). +orthopnea and PND. Legs with pitting edema. Notes low appetite and muscle loss.  BNP 1214. Cxray showed mild interstitial edema. Noted some productive cough. No reports of fever. He noted he was taking the lasix 20 mg daily. He denied chest pressure. Symptoms were not similar to his MI. He was otherwise stable. On room air satting high 90s-100 ? ?Hospital Course  ?   ?Consultants: None  ? ?Acute on chronic combined systolic and diastolic CHF  ?- Echo this admission showed EF 26%, decreased since 05/2021 (EF 35-40%)  ?- Patient was given IV lasix 40 mg BID while admitted. Output 2.25 L urine yesterday and is currently net -7.9 L since admission. Creatinine has been stable.  ?- Transitioned to oral lasix 40 mg daily PRN with supplemental K on days that he takes lasix. Stressed the importance of daily weights. Will need BMP at follow up   ?- Continue carvedilol 6.25 mg BID  ?- Continue farxiga 10 mg daily  ?- Continue entresto 24/26 ?- Additional GDMT difficult due to low BP and renal function.  ?- Encouraged patient to keep a log of his BP, HR, and weight and to bring to follow up appointments   ?- Patient has a follow up appointment with Dr. Claiborne Billings on 5/17. Needs BMP at that visit to assess renal function and K  ?  ?Atrial flutter  ?- Underwent TEE guided cardioversion on 5/10, maintaining sinus rhythm per telemetry. Symptoms improved   ?- Likely that recent  uncontrolled atrial flutter could be contributing to his cardiomyopathy ?- Continue carvedilol 6.25 mg BID  ?- Continue eliquis  ?- Outpatient referral to EP for consideration of aflutter ablation-- message has been sent to the office for scheduling   ?  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 Now also on eliquis. Confirmed with Dr. Acie Fredrickson that patient does not need to continue to take ASA on discharge.  ?- Continue plavix and eliquis  ?- Continue carvedilol  ?- Continue lipitor 80 mg daily  ?- Patient never completed outpatient cardiac rehab after CABG. Would like to at this time, I placed an outpatient referral  ?  ?Moderate-severe MR  ?- Likely caused by increased dilation/reduced EF  ?- Follow as an outpatient, may eventually need evaluation for repair  ? ?  Did the patient have an acute coronary syndrome (MI, NSTEMI, STEMI, etc) this admission?:  No                               ?Did the patient have a percutaneous coronary intervention (stent / angioplasty)?:  No.   ? ?   ? Patient was seen and examined by Dr. Acie Fredrickson and deemed stable for discharge.  ? ?Patient has a follow up appointment on 5/17 ?_____________ ? ?Discharge Vitals ?Blood pressure 114/85, pulse 84, temperature 97.7 ?F (36.5 ?C), temperature source Oral, resp. rate 16, height 6\' 3"  (1.905 m), weight 91.2 kg, SpO2 100 %.  ?Filed Weights  ? 09/19/21 0400 09/20/21 0255 09/21/21 0514  ?Weight: 94.9 kg 92.9 kg 91.2 kg  ? ? ?Labs & Radiologic Studies  ?  ?CBC ?No results for input(s): WBC, NEUTROABS, HGB, HCT, MCV, PLT in the last 72 hours. ?Basic Metabolic Panel ?Recent Labs  ?  09/19/21 ?0411 09/20/21 ?0409  ?NA 140 138  ?K 4.2 4.1  ?CL 106 103  ?CO2 26 30  ?GLUCOSE 86 87  ?BUN 32* 34*  ?CREATININE 1.39* 1.46*  ?CALCIUM 8.8* 8.8*  ? ?Liver Function Tests ?No results for input(s): AST, ALT, ALKPHOS, BILITOT, PROT, ALBUMIN in the last 72 hours. ?No results for input(s): LIPASE, AMYLASE  in the last 72 hours. ?High Sensitivity Troponin:   ?Recent Labs  ?Lab 09/17/21 ?1035 09/17/21 ?1221  ?TROPONINIHS 15 13  ?  ?BNP ?Invalid input(s): POCBNP ?D-Dimer ?No results for input(s): DDIMER in the last 72

## 2021-09-21 NOTE — Care Management Important Message (Signed)
Important Message ? ?Patient Details  ?Name: Timothy Munoz ?MRN: [id number] ?Date of Birth: 15-Sep-1945 ? ? ?Medicare Important Message Given:  Yes ? ? ? ? ?Renie Ora ?09/21/2021, 10:04 AM ?

## 2021-09-21 NOTE — Discharge Instructions (Signed)
Nursing education: ?Take medications as ordered. Take lasix and potassium chloride on the same days as ordered. Try to eat a low salt diet, avoid fast foods, processed foods including meats like hot dogs, and avoid can vegetables. Fresh or frozen vegetables. If possible, eat fresh along with a Mediterranean type diet. ?Keep all doctor appointments.  ?

## 2021-09-21 NOTE — Progress Notes (Addendum)
? ?Progress Note ? ?Patient Name: Timothy Munoz ?Date of Encounter: 09/21/2021 ? ?[facility] Cardiologist: Shelva Majestic, MD  ? ?Subjective  ? ?Patient reports his breathing is significantly better after cardioversion yesterday. Denies any chest pain, palpitations, dizziness/lightheadedness.  ? ?Inpatient Medications  ?  ?Scheduled Meds: ? apixaban  5 mg Oral BID  ? aspirin EC  81 mg Oral Daily  ? atorvastatin  80 mg Oral Daily  ? clopidogrel  75 mg Oral Daily  ? dapagliflozin propanediol  10 mg Oral QAC breakfast  ? furosemide  40 mg Oral Daily  ? metoprolol tartrate  25 mg Oral BID  ? potassium chloride  20 mEq Oral Daily  ? primidone  100 mg Oral Daily  ? sodium chloride flush  3 mL Intravenous Q12H  ? ?Continuous Infusions: ? sodium chloride    ? ?PRN Meds: ?sodium chloride, acetaminophen, ondansetron (ZOFRAN) IV, polyethylene glycol, sodium chloride flush  ? ?Vital Signs  ?  ?Vitals:  ? 09/20/21 2222 09/20/21 2228 09/21/21 0514 09/21/21 0724  ?BP:  98/79 93/76 111/83  ?Pulse: 77 79 78 73  ?Resp:  18 18 16   ?Temp:  97.8 ?F (36.6 ?C) 97.7 ?F (36.5 ?C) 97.9 ?F (36.6 ?C)  ?TempSrc:  Oral Oral Oral  ?SpO2: 91% 96% 98% 100%  ?Weight:   91.2 kg   ?Height:      ? ? ?Intake/Output Summary (Last 24 hours) at 09/21/2021 0733 ?Last data filed at 09/21/2021 0724 ?Gross per 24 hour  ?Intake 557 ml  ?Output 2350 ml  ?Net -1793 ml  ? ? ?  09/21/2021  ?  5:14 AM 09/20/2021  ?  2:55 AM 09/19/2021  ?  4:00 AM  ?Last 3 Weights  ?Weight (lbs) 201 lb 204 lb 11.2 oz 209 lb 3.2 oz  ?Weight (kg) 91.173 kg 92.851 kg 94.892 kg  ?   ? ?Telemetry  ?  ?Sinus rhythm, HR in the 70s-80s - Personally Reviewed ? ?ECG  ?  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 ?Physical Exam  ? ?GEN: No acute distress.  Laying flat in the bed, not wearing nasal cannula  ?Neck: No JVD ?Cardiac: RRR, no murmurs, rubs, or gallops.  ?Respiratory: Clear to auscultation bilaterally. ?GI: Soft, nontender, non-distended  ?MS: No edema; No  deformity. ?Neuro:  Nonfocal  ?Psych: Normal affect  ? ?Labs  ?  ?High Sensitivity Troponin:   ?Recent Labs  ?Lab 09/17/21 ?1035 09/17/21 ?1221  ?TROPONINIHS 15 13  ?   ?Chemistry ?Recent Labs  ?Lab 09/17/21 ?1035 09/18/21 ?0331 09/19/21 ?0411 09/20/21 ?0409  ?NA 140 139 140 138  ?K 4.3 4.0 4.2 4.1  ?CL 107 106 106 103  ?CO2 26 29 26 30   ?GLUCOSE 87 87 86 87  ?BUN 30* 27* 32* 34*  ?CREATININE 1.42* 1.49* 1.39* 1.46*  ?CALCIUM 9.1 8.7* 8.8* 8.8*  ?MG  --  2.2  --   --   ?PROT 6.7  --   --   --   ?ALBUMIN 3.6  --   --   --   ?AST 21  --   --   --   ?ALT 19  --   --   --   ?ALKPHOS 121  --   --   --   ?BILITOT 0.8  --   --   --   ?GFRNONAA 52* 49* 53* 50*  ?ANIONGAP 7 4* 8 5  ?  ?Lipids No results for input(s): CHOL, TRIG,  HDL, LABVLDL, LDLCALC, CHOLHDL in the last 168 hours.  ?Hematology ?Recent Labs  ?Lab 09/17/21 ?1035  ?WBC 5.6  ?RBC 4.57  ?HGB 11.6*  ?HCT 39.1  ?MCV 85.6  ?MCH 25.4*  ?MCHC 29.7*  ?RDW 18.0*  ?PLT 303  ? ?Thyroid  ?Recent Labs  ?Lab 09/17/21 ?1905  ?TSH 5.397*  ?  ?BNP ?Recent Labs  ?Lab 09/17/21 ?1035 09/17/21 ?1905  ?BNP 1,214.9* 824.1*  ?  ?DDimer No results for input(s): DDIMER in the last 168 hours.  ? ?Radiology  ?  ?ECHO TEE ? ?Result Date: 09/20/2021 ?   TRANSESOPHOGEAL ECHO REPORT   Patient Name:   Timothy Munoz Date of Exam: 09/20/2021 Medical Rec #:  [id number]       Height:       75 in Accession #:    [id number]      Weight:       204.7 lb Date of Birth:  [birth year]       BSA:          2.216 m? Patient Age:    76 years        BP:           105/83 mmHg Patient Gender: M               HR:           102 bpm. Exam Location:  Inpatient Procedure: Transesophageal Echo, Cardiac Doppler, Color Doppler and 3D Echo Indications:     atrail fibrillation  History:         Patient has prior history of Echocardiogram examinations, most                  recent 09/18/2021. CHF, CAD, Prior CABG; Risk                  Factors:Hypertension and Dyslipidemia.  Sonographer:     Darlina Sicilian RDCS Referring  Phys:  [id number] Margie Billet Diagnosing Phys: Candee Furbish MD PROCEDURE: After discussion of the risks and benefits of a TEE, an informed consent was obtained from the patient. TEE procedure time was 12 minutes. The transesophogeal probe was passed without difficulty through the esophogus of the patient. Imaged were obtained with the patient in a left lateral decubitus position. Sedation performed by different physician. The patient was monitored while under deep sedation. Anesthestetic sedation was provided intravenously by Anesthesiology: 177.93mg  of Propofol, 100mg  of Lidocaine. Image quality was good. The patient's vital signs; including heart rate, blood pressure, and oxygen saturation; remained stable throughout the procedure. The patient developed no complications during the procedure. A successful direct current cardioversion was performed at 200 joules with 1 attempt. IMPRESSIONS  1. Left ventricular ejection fraction, by estimation, is 20 to 25%. The left ventricle has severely decreased function. The left ventricle has no regional wall motion abnormalities.  2. Right ventricular systolic function is normal. The right ventricular size is normal.  3. No left atrial/left atrial appendage thrombus was detected.  4. PISA radius 0.4 cm     Annular diameter 3.07 cm     Mal Coaptation 0.26 cm     MR ERO 0.13 cm2     MR Vol 12 ml     Normal pulmonary vein flow. The mitral valve is degenerative. Moderate mitral valve regurgitation. No evidence of mitral stenosis.  5. The aortic valve is normal in structure. Aortic valve regurgitation is not visualized. No aortic stenosis is present.  6. There is mild (Grade  II) plaque.  7. The inferior vena cava is normal in size with greater than 50% respiratory variability, suggesting right atrial pressure of 3 mmHg. FINDINGS  Left Ventricle: Left ventricular ejection fraction, by estimation, is 20 to 25%. The left ventricle has severely decreased function. The left ventricle  has no regional wall motion abnormalities. The left ventricular internal cavity size was normal in size. There is no left ventricular hypertrophy. Right Ventricle: The right ventricular size is normal. No increase in right ventricular wall thickness. Right ventricular systolic function is normal. Left Atrium: Left atrial size was normal in size. No left atrial/left atrial appendage thrombus was detected. Right Atrium: Right atrial size was normal in size. Pericardium: There is no evidence of pericardial effusion. Mitral Valve: PISA radius 0.4 cm Annular diameter 3.07 cm Mal Coaptation 0.26 cm MR ERO 0.13 cm2 MR Vol 12 ml Normal pulmonary vein flow. The mitral valve is degenerative in appearance. Moderate mitral valve regurgitation. No evidence of mitral valve stenosis. Tricuspid Valve: The tricuspid valve is normal in structure. Tricuspid valve regurgitation is mild . No evidence of tricuspid stenosis. Aortic Valve: The aortic valve is normal in structure. Aortic valve regurgitation is not visualized. No aortic stenosis is present. Pulmonic Valve: The pulmonic valve was normal in structure. Pulmonic valve regurgitation is not visualized. No evidence of pulmonic stenosis. Aorta: The aortic root is normal in size and structure. There is mild (Grade II) plaque. Venous: The inferior vena cava is normal in size with greater than 50% respiratory variability, suggesting right atrial pressure of 3 mmHg. IAS/Shunts: No atrial level shunt detected by color flow Doppler.  LEFT VENTRICLE PLAX 2D LVOT diam:     2.30 cm LVOT Area:     4.15 cm?  MR Peak grad:    47.1 mmHg MR Mean grad:    28.5 mmHg    SHUNTS MR Vmax:         343.00 cm/s  Systemic Diam: 2.30 cm MR Vmean:        242.5 cm/s MR PISA:         1.01 cm? MR PISA Eff ROA: 11 mm? MR PISA Radius:  0.40 cm Candee Furbish MD Electronically signed by Candee Furbish MD Signature Date/Time: 09/20/2021/3:26:29 PM    Final    ? ?Cardiac Studies  ? ?Echocardiogram 09/18/21  ? 1. Left  ventricular ejection fraction by 3D volume is 26 %. The left  ?ventricle has severely decreased function. The left ventricle demonstrates  ?global hypokinesis. The left ventricular internal cavity size was  ?mod

## 2021-09-22 ENCOUNTER — Other Ambulatory Visit: Payer: Self-pay | Admitting: Physician Assistant

## 2021-09-22 DIAGNOSIS — M79606 Pain in leg, unspecified: Secondary | ICD-10-CM

## 2021-09-25 ENCOUNTER — Other Ambulatory Visit: Payer: Self-pay

## 2021-09-25 ENCOUNTER — Ambulatory Visit (HOSPITAL_COMMUNITY)
Admission: RE | Admit: 2021-09-25 | Discharge: 2021-09-25 | Disposition: A | Discharge status: Home / Self Care | Payer: Medicare HMO | Source: Ambulatory Visit | Attending: Physician Assistant | Admitting: Physician Assistant

## 2021-09-25 DIAGNOSIS — M79605 Pain in left leg: Secondary | ICD-10-CM | POA: Diagnosis not present

## 2021-09-25 DIAGNOSIS — M79604 Pain in right leg: Secondary | ICD-10-CM

## 2021-09-25 DIAGNOSIS — I5021 Acute systolic (congestive) heart failure: Secondary | ICD-10-CM

## 2021-09-25 DIAGNOSIS — M79606 Pain in leg, unspecified: Secondary | ICD-10-CM | POA: Insufficient documentation

## 2021-09-25 NOTE — Telephone Encounter (Signed)
Patient has been seen in the ER 

## 2021-09-26 ENCOUNTER — Other Ambulatory Visit: Payer: Self-pay | Admitting: Cardiology

## 2021-09-26 DIAGNOSIS — Z951 Presence of aortocoronary bypass graft: Secondary | ICD-10-CM

## 2021-09-26 DIAGNOSIS — I255 Ischemic cardiomyopathy: Secondary | ICD-10-CM

## 2021-09-27 ENCOUNTER — Encounter: Payer: Self-pay | Admitting: Cardiovascular Disease

## 2021-09-27 ENCOUNTER — Ambulatory Visit: Payer: Medicare HMO | Admitting: Cardiovascular Disease

## 2021-09-27 VITALS — BP 110/77 | HR 65 | Ht 74.0 in | Wt 210.0 lb

## 2021-09-27 DIAGNOSIS — I483 Typical atrial flutter: Secondary | ICD-10-CM | POA: Diagnosis not present

## 2021-09-27 DIAGNOSIS — I5021 Acute systolic (congestive) heart failure: Secondary | ICD-10-CM | POA: Diagnosis not present

## 2021-09-27 DIAGNOSIS — E785 Hyperlipidemia, unspecified: Secondary | ICD-10-CM | POA: Diagnosis not present

## 2021-09-27 DIAGNOSIS — Z951 Presence of aortocoronary bypass graft: Secondary | ICD-10-CM | POA: Diagnosis not present

## 2021-09-27 DIAGNOSIS — I2102 ST elevation (STEMI) myocardial infarction involving left anterior descending coronary artery: Secondary | ICD-10-CM | POA: Diagnosis not present

## 2021-09-27 MED ORDER — ROSUVASTATIN CALCIUM 20 MG PO TABS: 20.0000 mg | ORAL_TABLET | Freq: Every day | ORAL | 3 refills | Status: DC

## 2021-09-27 NOTE — Progress Notes (Signed)
Cardiology Office Note    Date:  10/07/2021   ID:  Timothy Munoz, DOB January 20, 1946, MRN [id number]  PCP:  Orpah Melter, MD  Cardiologist:  Shelva Majestic, MD   Initial office visit with me   History of Present Illness:  Timothy Munoz is a 76 y.o. male who suffered an anterolateral ST segment elevation myocardial infarction in September 2022 and underwent initial balloon angioplasty of his ostial LAD.  His course was complicated by cardiogenic shock with reduced LV function with EF at 35%.  He required intra-aortic balloon counterpulsation for hemodynamic support and ultimately underwent CABG revascularization surgery x3 in January 20, 2021 by Dr. Roxan Hockey.  He had developed postoperative atrial fibrillation requiring amiodarone and was also treated with digoxin.  An echo Doppler study on January 25, 2021 showed slight improvement in LV function with EF at 40 to 45% with grade 2 diastolic dysfunction.  He was on furosemide for volume overload.  He was subsequently seen in the office setting by Coletta Memos, NP on May 26, 2020 and his Lasix was changed to as needed.  Repeat echo Doppler study in June 06, 2021 showed EF 35 to 40% with moderate PA systolic pressure elevation at 53.4 mm, biatrial enlargement, and moderate to severe MR.  He was started on Farxiga with subsequent dose reduction due to creatinine elevation.  He was seen in April 2023 by Almyra Deforest, PA and was transition from ARB therapy to Entresto 24/26 mg twice a day.  He was hospitalized on May 7 with discharge Sep 21, 2021 increasing dyspnea and 5 pound weight gain in addition to significant lower extremity edema.  He was treated with IV furosemide and an echo showed further reduction of EF at 26% on Sep 18, 2021.  He was in atrial flutter and on May 10 underwent TEE guided cardioversion with restoration of sinus rhythm and plan for  subsequent EP evaluation was recommended with Dr. Lovena Le.  Presently, he feels much better  and denies chest pain or shortness of breath.  He currently is on carvedilol 6.25 mg twice a day, Entresto 24/26 mg twice a day, as needed furosemide, Farxiga 5 mg, and is on Plaquenil 75 mg and Eliquis 5 mg twice a day.  He presents for his initial office evaluation with me.  Past Medical History:  Diagnosis Date   Hyperlipidemia    mild    Hypertension    Knee pain    Obesity    Tremor    Tremor, essential 03/04/2019    Past Surgical History:  Procedure Laterality Date   APPENDECTOMY     BACK SURGERY     CARDIOVERSION N/A 09/20/2021   Procedure: CARDIOVERSION;  Surgeon: Jerline Pain, MD;  Location: [facility];  Service: Cardiovascular;  Laterality: N/A;   CORONARY ARTERY BYPASS GRAFT N/A 01/20/2021   Procedure: CORONARY ARTERY BYPASS GRAFTING (CABG) X 3 ON PUMP, USING LEFT INTERNAL MAMMARY ARTERY AND LEFT ENDOSCOPIC GREATER SAPHENOUS VEIN HARVEST CONDUITS;  Surgeon: Melrose Nakayama, MD;  Location: [facility];  Service: Open Heart Surgery;  Laterality: N/A;   CORONARY/GRAFT ACUTE MI REVASCULARIZATION N/A 01/16/2021   Procedure: Coronary/Graft Acute MI Revascularization;  Surgeon: Troy Sine, MD;  Location: [facility];  Service: Cardiovascular;  Laterality: N/A;   ENDOVEIN HARVEST OF GREATER SAPHENOUS VEIN Left 01/20/2021   Procedure: ENDOVEIN HARVEST OF GREATER SAPHENOUS VEIN;  Surgeon: Melrose Nakayama, MD;  Location: [facility];  Service: Open Heart Surgery;  Laterality: Left;  HEMORRHOID SURGERY     IABP INSERTION N/A 01/16/2021   Procedure: IABP Insertion;  Surgeon: Troy Sine, MD;  Location: [facility];  Service: Cardiovascular;  Laterality: N/A;   LEFT HEART CATH AND CORONARY ANGIOGRAPHY N/A 01/16/2021   Procedure: LEFT HEART CATH AND CORONARY ANGIOGRAPHY;  Surgeon: Troy Sine, MD;  Location: [facility];  Service: Cardiovascular;  Laterality: N/A;   TEE WITHOUT CARDIOVERSION N/A 01/20/2021   Procedure: TRANSESOPHAGEAL ECHOCARDIOGRAM (TEE);   Surgeon: Melrose Nakayama, MD;  Location: [facility];  Service: Open Heart Surgery;  Laterality: N/A;   TEE WITHOUT CARDIOVERSION N/A 09/20/2021   Procedure: TRANSESOPHAGEAL ECHOCARDIOGRAM (TEE);  Surgeon: Jerline Pain, MD;  Location: [facility];  Service: Cardiovascular;  Laterality: N/A;    Current Medications: Outpatient Medications Prior to Visit  Medication Sig Dispense Refill   apixaban (ELIQUIS) 5 MG TABS tablet Take 1 tablet (5 mg total) by mouth 2 (two) times daily. 180 tablet 3   carvedilol (COREG) 6.25 MG tablet Take 1 tablet (6.25 mg total) by mouth 2 (two) times daily with a meal. 180 tablet 1   clopidogrel (PLAVIX) 75 MG tablet Take 1 tablet (75 mg total) by mouth daily. 30 tablet 11   dapagliflozin propanediol (FARXIGA) 10 MG TABS tablet Take 1 tablet (10 mg total) by mouth daily before breakfast. (Patient taking differently: Take 5 mg by mouth daily before breakfast.) 30 tablet 6   furosemide (LASIX) 40 MG tablet Take 1 tablet (40 mg total) by mouth daily as needed for fluid. 90 tablet 3   potassium chloride SA (KLOR-CON M) 20 MEQ tablet Take 1 tablet (20 mEq total) by mouth daily as needed (take along with furosemide). 90 tablet 3   primidone (MYSOLINE) 50 MG tablet Take 1-2 tablets daily for tremor (Patient taking differently: Take 100 mg by mouth daily.) 180 tablet 3   sacubitril-valsartan (ENTRESTO) 24-26 MG Take 1 tablet by mouth 2 (two) times daily. 60 tablet 3   atorvastatin (LIPITOR) 80 MG tablet Take 1 tablet (80 mg total) by mouth daily. 30 tablet 6   sildenafil (VIAGRA) 25 MG tablet Take 1 tablet (25 mg total) by mouth daily as needed for erectile dysfunction (REFILLS WITH PCP PNLY). (Patient taking differently: Take 25 mg by mouth daily as needed for erectile dysfunction.) 10 tablet 0   No facility-administered medications prior to visit.     Allergies:   Patient has no known allergies.   Social History   Socioeconomic History   Marital status: Single     Spouse name: Not on file   Number of children: Not on file   Years of education: Not on file   Highest education level: Not on file  Occupational History   Occupation: retired    Comment: Clinical cytogeneticist  Tobacco Use   Smoking status: Former   Smokeless tobacco: Current    Types: Chew  Substance and Sexual Activity   Alcohol use: Yes    Comment: one beer every two months   Drug use: No   Sexual activity: Not on file  Other Topics Concern   Not on file  Social History Narrative   Right handed    Social Determinants of Health   Financial Resource Strain: Not on file  Food Insecurity: Not on file  Transportation Needs: Not on file  Physical Activity: Not on file  Stress: Not on file  Social Connections: Not on file    He is retired and was in  the plumbing and heating and air-conditioning business.  He worked for over 40 years.  Prior tobacco history, quit smoking over 30 years ago  Family History:  The patient's family history includes Alzheimer's disease in his mother; Hypertension in his father and mother; Parkinson's disease in his mother; Suicidality in his father.   ROS General: Negative; No fevers, chills, or night sweats;  HEENT: Negative; No changes in vision or hearing, sinus congestion, difficulty swallowing Pulmonary: Negative; No cough, wheezing, shortness of breath, hemoptysis Cardiovascular: Negative; No chest pain, presyncope, syncope, palpitations GI: Negative; No nausea, vomiting, diarrhea, or abdominal pain GU: Negative; No dysuria, hematuria, or difficulty voiding Musculoskeletal: Negative; no myalgias, joint pain, or weakness Hematologic/Oncology: Negative; no easy bruising, bleeding Endocrine: Negative; no heat/cold intolerance; no diabetes Neuro: Negative; no changes in balance, headaches Skin: Negative; No rashes or skin lesions Psychiatric: Negative; No behavioral problems, depression Sleep: Negative; No snoring, daytime sleepiness, hypersomnolence,  bruxism, restless legs, hypnogognic hallucinations, no cataplexy Other comprehensive 14 point system review is negative.   PHYSICAL EXAM:   VS:  BP 110/77   Pulse 65   Ht 6' 2"  (1.88 m)   Wt 210 lb (95.3 kg)   SpO2 99%   BMI 26.96 kg/m     Repeat blood pressure by me was 102/60 supine and 102/64 standing.  Wt Readings from Last 3 Encounters:  10/06/21 215 lb 6.4 oz (97.7 kg)  09/27/21 210 lb (95.3 kg)  09/21/21 201 lb (91.2 kg)    General: Alert, oriented, no distress.  Skin: normal turgor, no rashes, warm and dry HEENT: Normocephalic, atraumatic. Pupils equal round and reactive to light; sclera anicteric; extraocular muscles intact;  Nose without nasal septal hypertrophy Mouth/Parynx benign; Mallinpatti scale 3 Neck: No JVD, no carotid bruits; normal carotid upstroke Lungs: clear to ausculatation and percussion; no wheezing or rales Chest wall: without tenderness to palpitation Heart: PMI not displaced, RRR, s1 s2 normal, 1/6 systolic murmur, no diastolic murmur, no rubs, gallops, thrills, or heaves Abdomen: soft, nontender; no hepatosplenomehaly, BS+; abdominal aorta nontender and not dilated by palpation. Back: no CVA tenderness Pulses 2+ Musculoskeletal: full range of motion, normal strength, no joint deformities Extremities: no clubbing cyanosis or edema, Homan's sign negative  Neurologic: grossly nonfocal; Cranial nerves grossly wnl Psychologic: Normal mood and affect   Studies/Labs Reviewed:   ECG (independently read by me): Sinus rhythm at 70 bpm with first-degree AV block, PR interval 260 ms.  QS complex V1 through V5 consistent with prior anterior MI.  QTc increased at 473 ms.  Recent Labs:    Latest Ref Rng & Units 09/27/2021   10:50 AM 09/20/2021    4:09 AM 09/19/2021    4:11 AM  BMP  Glucose 70 - 99 mg/dL 88   87   86    BUN 8 - 27 mg/dL 30   34   32    Creatinine 0.76 - 1.27 mg/dL 1.27   1.46   1.39    BUN/Creat Ratio 10 - 24 24      Sodium 134 - 144  mmol/L 139   138   140    Potassium 3.5 - 5.2 mmol/L 4.9   4.1   4.2    Chloride 96 - 106 mmol/L 102   103   106    CO2 20 - 29 mmol/L 24   30   26     Calcium 8.6 - 10.2 mg/dL 9.0   8.8   8.8  Latest Ref Rng & Units 09/27/2021   10:50 AM 09/17/2021   10:35 AM 01/16/2021    7:39 AM  Hepatic Function  Total Protein 6.0 - 8.5 g/dL 6.7   6.7   6.4    Albumin 3.7 - 4.7 g/dL 3.9   3.6   3.7    AST 0 - 40 IU/L 26   21   570    ALT 0 - 44 IU/L 21   19   86    Alk Phosphatase 44 - 121 IU/L 153   121   133    Total Bilirubin 0.0 - 1.2 mg/dL 0.4   0.8   0.5         Latest Ref Rng & Units 09/17/2021   10:35 AM 08/25/2021   10:10 AM 01/28/2021    5:24 AM  CBC  WBC 4.0 - 10.5 K/uL 5.6   8.1   12.7    Hemoglobin 13.0 - 17.0 g/dL 11.6   10.0   8.6    Hematocrit 39.0 - 52.0 % 39.1   31.1   27.0    Platelets 150 - 400 K/uL 303   390   590     Lab Results  Component Value Date   MCV 85.6 09/17/2021   MCV 80 08/25/2021   MCV 90.6 01/28/2021   Lab Results  Component Value Date   TSH 5.397 (H) 09/17/2021   Lab Results  Component Value Date   HGBA1C 6.0 (H) 01/16/2021     BNP    Component Value Date/Time   BNP 824.1 (H) 09/17/2021 1905    ProBNP    Component Value Date/Time   PROBNP 3,278 (H) 09/27/2021 1050     Lipid Panel     Component Value Date/Time   CHOL 163 01/16/2021 0739   TRIG 33 01/16/2021 0739   HDL 42 01/16/2021 0739   CHOLHDL 3.9 01/16/2021 0739   VLDL 7 01/16/2021 0739   LDLCALC 114 (H) 01/16/2021 0739     RADIOLOGY: DG Chest 2 View  Result Date: 09/17/2021 CLINICAL DATA:  Shortness of breath EXAM: CHEST - 2 VIEW COMPARISON:  03/07/2021 and prior studies FINDINGS: UPPER limits normal heart size noted. Interstitial opacities are present suggestive of mild interstitial edema. Trace bilateral pleural effusions are noted. There is no evidence of pneumothorax or acute bony abnormality. IMPRESSION: Interstitial opacities suggestive of mild interstitial  edema. Trace bilateral pleural effusions. Electronically Signed   By: Margarette Canada M.D.   On: 09/17/2021 11:56   VAS [place] ABI WITH/WO TBI  Result Date: 09/25/2021  LOWER EXTREMITY DOPPLER STUDY Patient Name:  Timothy Munoz  Date of Exam:   09/25/2021 Medical Rec #: [id number]        Accession #:    [id number] Date of Birth: 1946/01/16        Patient Gender: M Patient Age:   43 years Exam Location:  [facility] Procedure:      VAS [place] ABI WITH/WO TBI Referring Phys: HAO MENG --------------------------------------------------------------------------------  Indications: Patient reports feeling like his calves are going to give out when              walking about 150 feet to his mailbox or up an incline for several              months prior to having his triple bypass on 01/20/2021. Today, he              states the symptoms in his legs  are less severe after having his              surgery as well as having 13 pounds of fluid coming off his legs              after starting Lasix last week. High Risk Factors: Hypertension, hyperlipidemia, past history of smoking, prior                    MI, coronary artery disease. Other Factors: CABG x 3.  Comparison Study: In 01/2021, a lower arterial Doppler performed at [facility] for pre CABG showed an ABI of 1.10 on the right and                   .95 on the left. Performing Technologist: Sharlett Iles RVT  Examination Guidelines: A complete evaluation includes at minimum, Doppler waveform signals and systolic blood pressure reading at the level of bilateral brachial, anterior tibial, and posterior tibial arteries, when vessel segments are accessible. Bilateral testing is considered an integral part of a complete examination. Photoelectric Plethysmograph (PPG) waveforms and toe systolic pressure readings are included as required and additional duplex testing as needed. Limited examinations for reoccurring indications may be performed as noted.  ABI  Findings: +---------+------------------+-----+---------+--------+ Right    Rt Pressure (mmHg)IndexWaveform Comment  +---------+------------------+-----+---------+--------+ Brachial 96                                       +---------+------------------+-----+---------+--------+ PTA      113               1.10 triphasic         +---------+------------------+-----+---------+--------+ PERO     101               0.98 triphasic         +---------+------------------+-----+---------+--------+ DP       111               1.08 triphasic         +---------+------------------+-----+---------+--------+ Great Toe155               1.50 Normal            +---------+------------------+-----+---------+--------+ +---------+------------------+-----+---------+-------+ Left     Lt Pressure (mmHg)IndexWaveform Comment +---------+------------------+-----+---------+-------+ Brachial 103                                     +---------+------------------+-----+---------+-------+ PTA      113               1.10 triphasic        +---------+------------------+-----+---------+-------+ PERO     122               1.27 triphasic        +---------+------------------+-----+---------+-------+ DP       116               1.13 triphasic        +---------+------------------+-----+---------+-------+ Ellender Hose               1.32 Normal           +---------+------------------+-----+---------+-------+ +-------+-----------+-----------+------------+------------+ ABI/TBIToday's ABIToday's TBIPrevious ABIPrevious TBI +-------+-----------+-----------+------------+------------+ Right  1.10  1.50       1.10                     +-------+-----------+-----------+------------+------------+ Left   1.18       1.32       .95                      +-------+-----------+-----------+------------+------------+  Bilateral ABIs appear essentially unchanged compared to prior study on  01/18/2021.  Summary: Right: Resting right ankle-brachial index is within normal range. No evidence of significant right lower extremity arterial disease. The right toe-brachial index is normal. Left: Resting left ankle-brachial index is within normal range. No evidence of significant left lower extremity arterial disease. The left toe-brachial index is normal. *See table(s) above for measurements and observations.  See LE Arterial duplex report. Electronically signed by Jenkins Rouge MD on 09/25/2021 at 2:27:43 PM.    Final    ECHO TEE  Result Date: 09/20/2021    TRANSESOPHOGEAL ECHO REPORT   Patient Name:   Timothy Munoz Date of Exam: 09/20/2021 Medical Rec #:  [id number]       Height:       75.0 in Accession #:    [id number]      Weight:       204.7 lb Date of Birth:  19-May-1945       BSA:          2.216 m Patient Age:    72 years        BP:           105/83 mmHg Patient Gender: M               HR:           102 bpm. Exam Location:  Inpatient Procedure: Transesophageal Echo, Cardiac Doppler, Color Doppler and 3D Echo Indications:     atrail fibrillation  History:         Patient has prior history of Echocardiogram examinations, most                  recent 09/18/2021. CHF, CAD, Prior CABG; Risk                  Factors:Hypertension and Dyslipidemia.  Sonographer:     Darlina Sicilian RDCS Referring Phys:  [id number] Margie Billet Diagnosing Phys: Candee Furbish MD PROCEDURE: After discussion of the risks and benefits of a TEE, an informed consent was obtained from the patient. TEE procedure time was 12 minutes. The transesophogeal probe was passed without difficulty through the esophogus of the patient. Imaged were obtained with the patient in a left lateral decubitus position. Sedation performed by different physician. The patient was monitored while under deep sedation. Anesthestetic sedation was provided intravenously by Anesthesiology: 177.32m of Propofol, 103mof Lidocaine. Image quality was good. The  patient's vital signs; including heart rate, blood pressure, and oxygen saturation; remained stable throughout the procedure. The patient developed no complications during the procedure. A successful direct current cardioversion was performed at 200 joules with 1 attempt. IMPRESSIONS  1. Left ventricular ejection fraction, by estimation, is 20 to 25%. The left ventricle has severely decreased function. The left ventricle has no regional wall motion abnormalities.  2. Right ventricular systolic function is normal. The right ventricular size is normal.  3. No left atrial/left atrial appendage thrombus was detected.  4. PISA radius 0.4 cm     Annular diameter 3.07 cm  Mal Coaptation 0.26 cm     MR ERO 0.13 cm2     MR Vol 12 ml     Normal pulmonary vein flow. The mitral valve is degenerative. Moderate mitral valve regurgitation. No evidence of mitral stenosis.  5. The aortic valve is normal in structure. Aortic valve regurgitation is not visualized. No aortic stenosis is present.  6. There is mild (Grade II) plaque.  7. The inferior vena cava is normal in size with greater than 50% respiratory variability, suggesting right atrial pressure of 3 mmHg. FINDINGS  Left Ventricle: Left ventricular ejection fraction, by estimation, is 20 to 25%. The left ventricle has severely decreased function. The left ventricle has no regional wall motion abnormalities. The left ventricular internal cavity size was normal in size. There is no left ventricular hypertrophy. Right Ventricle: The right ventricular size is normal. No increase in right ventricular wall thickness. Right ventricular systolic function is normal. Left Atrium: Left atrial size was normal in size. No left atrial/left atrial appendage thrombus was detected. Right Atrium: Right atrial size was normal in size. Pericardium: There is no evidence of pericardial effusion. Mitral Valve: PISA radius 0.4 cm Annular diameter 3.07 cm Mal Coaptation 0.26 cm MR ERO 0.13 cm2 MR  Vol 12 ml Normal pulmonary vein flow. The mitral valve is degenerative in appearance. Moderate mitral valve regurgitation. No evidence of mitral valve stenosis. Tricuspid Valve: The tricuspid valve is normal in structure. Tricuspid valve regurgitation is mild . No evidence of tricuspid stenosis. Aortic Valve: The aortic valve is normal in structure. Aortic valve regurgitation is not visualized. No aortic stenosis is present. Pulmonic Valve: The pulmonic valve was normal in structure. Pulmonic valve regurgitation is not visualized. No evidence of pulmonic stenosis. Aorta: The aortic root is normal in size and structure. There is mild (Grade II) plaque. Venous: The inferior vena cava is normal in size with greater than 50% respiratory variability, suggesting right atrial pressure of 3 mmHg. IAS/Shunts: No atrial level shunt detected by color flow Doppler.  LEFT VENTRICLE PLAX 2D LVOT diam:     2.30 cm LVOT Area:     4.15 cm  MR Peak grad:    47.1 mmHg MR Mean grad:    28.5 mmHg    SHUNTS MR Vmax:         343.00 cm/s  Systemic Diam: 2.30 cm MR Vmean:        242.5 cm/s MR PISA:         1.01 cm MR PISA Eff ROA: 11 mm MR PISA Radius:  0.40 cm Candee Furbish MD Electronically signed by Candee Furbish MD Signature Date/Time: 09/20/2021/3:26:29 PM    Final    VAS [place] LOWER EXTREMITY ARTERIAL DUPLEX  Result Date: 09/25/2021 LOWER EXTREMITY ARTERIAL DUPLEX STUDY Patient Name:  Timothy Munoz  Date of Exam:   09/25/2021 Medical Rec #: [id number]        Accession #:    [id number] Date of Birth: Jul 03, 1945        Patient Gender: M Patient Age:   56 years Exam Location:  [facility] Procedure:      VAS [place] LOWER EXTREMITY ARTERIAL DUPLEX Referring Phys: HAO MENG --------------------------------------------------------------------------------  Indications: Patient reports feeling like his calves are going to give out when              walking about 150 feet to his mailbox or up an incline for several              months  prior to having  his triple bypass on 01/20/2021. Today, he              states the symptoms in his legs are less severe after having his              surgery as well as having 13 pounds of fluid coming off his legs              after starting Lasix last week. High Risk Factors: Hypertension, hyperlipidemia, past history of smoking, prior                    MI, coronary artery disease. Other Factors: CABG x 3.  Current ABI: 1.10 on the right and 1.18 on the left. Comparison Study: NA Performing Technologist: Sharlett Iles RVT  Examination Guidelines: A complete evaluation includes B-mode imaging, spectral Doppler, color Doppler, and power Doppler as needed of all accessible portions of each vessel. Bilateral testing is considered an integral part of a complete examination. Limited examinations for reoccurring indications may be performed as noted.  +-----------+--------+-----+--------+---------+--------+ RIGHT      PSV cm/sRatioStenosisWaveform Comments +-----------+--------+-----+--------+---------+--------+ CFA Prox   101                  triphasic         +-----------+--------+-----+--------+---------+--------+ DFA        86                   triphasic         +-----------+--------+-----+--------+---------+--------+ SFA Prox   65                   triphasic         +-----------+--------+-----+--------+---------+--------+ SFA Mid    85                   triphasic         +-----------+--------+-----+--------+---------+--------+ SFA Distal 88                   triphasic         +-----------+--------+-----+--------+---------+--------+ POP Prox   91                   triphasic         +-----------+--------+-----+--------+---------+--------+ POP Mid    85                   triphasic         +-----------+--------+-----+--------+---------+--------+ POP Distal 65                   triphasic         +-----------+--------+-----+--------+---------+--------+ TP Trunk   56                    triphasic         +-----------+--------+-----+--------+---------+--------+ ATA Distal 63                   triphasic         +-----------+--------+-----+--------+---------+--------+ PTA Distal 68                   triphasic         +-----------+--------+-----+--------+---------+--------+ PERO Distal46                   triphasic         +-----------+--------+-----+--------+---------+--------+  +-----------+--------+-----+--------+---------+--------+ LEFT       PSV cm/sRatioStenosisWaveform Comments +-----------+--------+-----+--------+---------+--------+  CFA Prox   88                   triphasic         +-----------+--------+-----+--------+---------+--------+ DFA        64                   triphasic         +-----------+--------+-----+--------+---------+--------+ SFA Prox   80                   triphasic         +-----------+--------+-----+--------+---------+--------+ SFA Mid    85                   triphasic         +-----------+--------+-----+--------+---------+--------+ SFA Distal 64                   triphasic         +-----------+--------+-----+--------+---------+--------+ POP Prox   76                   triphasic         +-----------+--------+-----+--------+---------+--------+ POP Mid    71                   triphasic         +-----------+--------+-----+--------+---------+--------+ POP Distal 55                   triphasic         +-----------+--------+-----+--------+---------+--------+ TP Trunk   83                   triphasic         +-----------+--------+-----+--------+---------+--------+ ATA Distal 44                   triphasic         +-----------+--------+-----+--------+---------+--------+ PTA Distal 61                   triphasic         +-----------+--------+-----+--------+---------+--------+ PERO Distal52                   triphasic          +-----------+--------+-----+--------+---------+--------+  Summary: Right: Minimum atherosclerosis noted throughout the lower extremity; no evidence of significant arterial obstruction. Left: Minimum atherosclerosis noted throughout the lower extremity; no evidence of significant arterial obstruction.  See table(s) above for measurements and observations. See ABI report. Electronically signed by Jenkins Rouge MD on 09/25/2021 at 3:14:56 PM.    Final    ECHOCARDIOGRAM LIMITED  Result Date: 09/18/2021    ECHOCARDIOGRAM LIMITED REPORT   Patient Name:   Timothy Munoz Date of Exam: 09/18/2021 Medical Rec #:  [id number]       Height:       75.0 in Accession #:    [id number]      Weight:       210.3 lb Date of Birth:  08-26-1945       BSA:          2.242 m Patient Age:    57 years        BP:           100/79 mmHg Patient Gender: M               HR:           110 bpm. Exam Location:  Inpatient  Procedure: Limited Echo, 3D Echo, Color Doppler, Cardiac Doppler and            Intracardiac Opacification Agent Indications:    CHF-Acute Systolic O67.67                 Mitral valve insufficiency I34.0  History:        Patient has prior history of Echocardiogram examinations, most                 recent 06/06/2021. Risk Factors:Hypertension and Dyslipidemia.  Sonographer:    Bernadene Person RDCS Referring Phys: [place]  1. Left ventricular ejection fraction by 3D volume is 26 %. The left ventricle has severely decreased function. The left ventricle demonstrates global hypokinesis. The left ventricular internal cavity size was moderately dilated.  2. Right ventricular systolic function is normal. The right ventricular size is normal. There is normal pulmonary artery systolic pressure. The estimated right ventricular systolic pressure is 20.9 mmHg.  3. PISA radius 0.4 cm. The mitral valve is normal in structure. Moderate to severe mitral valve regurgitation. No evidence of mitral stenosis.  4. Tricuspid valve  regurgitation is moderate.  5. The aortic valve is normal in structure. Aortic valve regurgitation is not visualized. No aortic stenosis is present.  6. Aortic dilatation noted. There is mild dilatation of the aortic root, measuring 43 mm.  7. The inferior vena cava is normal in size with greater than 50% respiratory variability, suggesting right atrial pressure of 3 mmHg. Comparison(s): Prior images reviewed side by side. Mitral regurgitation was previously trivial/ mild. Likely increased from overall reduced EF, dilation. FINDINGS  Left Ventricle: Left ventricular ejection fraction by 3D volume is 26 %. The left ventricle has severely decreased function. The left ventricle demonstrates global hypokinesis. Definity contrast agent was given IV to delineate the left ventricular endocardial borders. The left ventricular internal cavity size was moderately dilated. There is no left ventricular hypertrophy. Right Ventricle: The right ventricular size is normal. No increase in right ventricular wall thickness. Right ventricular systolic function is normal. There is normal pulmonary artery systolic pressure. The tricuspid regurgitant velocity is 2.66 m/s, and  with an assumed right atrial pressure of 3 mmHg, the estimated right ventricular systolic pressure is 47.0 mmHg. Left Atrium: Left atrial size was normal in size. Right Atrium: Right atrial size was normal in size. Pericardium: There is no evidence of pericardial effusion. Mitral Valve: PISA radius 0.4 cm. The mitral valve is normal in structure. There is mild thickening of the mitral valve leaflet(s). There is mild calcification of the mitral valve leaflet(s). Mild mitral annular calcification. Moderate to severe mitral valve regurgitation. No evidence of mitral valve stenosis. Tricuspid Valve: The tricuspid valve is normal in structure. Tricuspid valve regurgitation is moderate . No evidence of tricuspid stenosis. Aortic Valve: The aortic valve is normal in  structure. Aortic valve regurgitation is not visualized. No aortic stenosis is present. Pulmonic Valve: The pulmonic valve was normal in structure. Pulmonic valve regurgitation is mild. No evidence of pulmonic stenosis. Aorta: Aortic dilatation noted. There is mild dilatation of the aortic root, measuring 43 mm. Venous: The inferior vena cava is normal in size with greater than 50% respiratory variability, suggesting right atrial pressure of 3 mmHg. IAS/Shunts: No atrial level shunt detected by color flow Doppler. LEFT VENTRICLE PLAX 2D LVIDd:         6.20 cm LVIDs:         5.50 cm LV PW:  0.80 cm         3D Volume EF LV IVS:        0.80 cm         LV 3D EF:    Left LVOT diam:     2.10 cm                      ventricul LV SV:         36                           ar LV SV Index:   16                           ejection LVOT Area:     3.46 cm                     fraction                                             by 3D                                             volume is LV Volumes (MOD)                            26 %. LV vol d, MOD    184.0 ml A2C: LV vol d, MOD    187.0 ml      3D Volume EF: A4C:                           3D EF:        26 % LV vol s, MOD    142.0 ml      LV EDV:       201 ml A2C:                           LV ESV:       148 ml LV vol s, MOD    141.0 ml      LV SV:        53 ml A4C: LV SV MOD A2C:   42.0 ml LV SV MOD A4C:   187.0 ml LV SV MOD BP:    46.4 ml RIGHT VENTRICLE RV S prime:     7.14 cm/s TAPSE (M-mode): 1.2 cm LEFT ATRIUM         Index LA diam:    3.80 cm 1.70 cm/m  AORTIC VALVE LVOT Vmax:   70.60 cm/s LVOT Vmean:  49.300 cm/s LVOT VTI:    0.105 m  AORTA Ao Root diam: 4.30 cm MR Peak grad:    63.7 mmHg    TRICUSPID VALVE MR Mean grad:    44.5 mmHg    TR Peak grad:   28.3 mmHg MR Vmax:         399.00 cm/s  TR Vmax:        266.00 cm/s MR Vmean:        317.0 cm/s MR  PISA:         1.01 cm     SHUNTS MR PISA Eff ROA: 10 mm       Systemic VTI:  0.10 m MR PISA Radius:  0.40 cm       Systemic Diam: 2.10 cm Candee Furbish MD Electronically signed by Candee Furbish MD Signature Date/Time: 09/18/2021/11:38:05 AM    Final      Additional studies/ records that were reviewed today include:   I have reviewed his extensive records since his anterolateral STEMI on January 20, 2021 with subsequent echoes and evaluations.    Mid LM to Dist LM lesion is 20% stenosed.   Ost LAD to Prox LAD lesion is 100% stenosed.   Ost Cx lesion is 90% stenosed.   Ost RCA lesion is 20% stenosed.   Post intervention, there is a 60% residual stenosis.   There is moderate left ventricular systolic dysfunction.   LV end diastolic pressure is moderately elevated.   The left ventricular ejection fraction is 35-45% by visual estimate.   Acute anterolateral myocardial infarction secondary to total near ostial occlusion of a large LAD which wraps around the LV apex.   Large normal ramus intermediate vessel.   Left circumflex vessel with 85 to 90% ostial stenosis.   Large RCA with 20% irregularity proximally.   PTCA of the LAD ostium with hypotension and idioventricular rhythm with reperfusion requiring initiation of Levophed reestablishment of TIMI-3 flow to a large LAD vessel which wraps around the LV apex.   Insertion of an intra-aortic balloon pump for optimal hemodynamic support with augmented diastolic pressure at 818 millimeters mercury.   RECOMMENDATION: In light of the patient's complex anatomy, stenting was not performed due to high likelihood of jailing a very large ramus intermediate vessel and with concomitant ostial circumflex stenosis it was felt that surgical revascularization once patient stabilizes from his myocardial infarction would be optimal therapy.  He did receive 1 dose of oral Brilinta in the laboratory which will not be continued.  We will plan 2D echo Doppler today.  Initiate aggressive lipid-lowering therapy.  Dr. Kipp Brood was contacted who will see the patient and depending upon  the patient;s hemodynamic stability perform CABG revascularization surgery this week following washout of his initial Brilinta dose.   Intervention  I   TEE: 09/20/2021  1. Left ventricular ejection fraction, by estimation, is 20 to 25%. The  left ventricle has severely decreased function. The left ventricle has no  regional wall motion abnormalities.   2. Right ventricular systolic function is normal. The right ventricular  size is normal.   3. No left atrial/left atrial appendage thrombus was detected.   4. PISA radius 0.4 cm      Annular diameter 3.07 cm      Mal Coaptation 0.26 cm      MR ERO 0.13 cm2      MR Vol 12 ml      Normal pulmonary vein flow. The mitral valve is degenerative. Moderate  mitral valve regurgitation. No evidence of mitral stenosis.   5. The aortic valve is normal in structure. Aortic valve regurgitation is  not visualized. No aortic stenosis is present.   6. There is mild (Grade II) plaque.   7. The inferior vena cava is normal in size with greater than 50%  respiratory variability, suggesting right atrial pressure of 3 mmHg.    ASSESSMENT:    1. Acute systolic CHF (congestive heart failure) ([place])   2. STEMI involving left  anterior descending coronary artery ([place]): 01/16/2021   3. S/P CABG x 3   4. Typical atrial flutter ([place])   5. Hyperlipidemia LDL goal <70     PLAN:  Timothy Munoz is a 76 year old retired gentleman who suffered an anterolateral myocardial infarction on January 14, 2021 leading to cardiogenic shock.  He underwent balloon angioplasty of an ostial total LAD occlusion, intra-aortic balloon counterpulsation with plans for CABG revascularization surgery following Brilinta washout and stability.  He has been found to have a subsequent ischemic cardiomyopathy and has had issues with volume overload.  He has been transitioned to [facility] from ARB therapy and is also currently on carvedilol 6.25 mg twice a day, reduced dose Farxiga 5 mg  daily, and continues to be on clopidogrel in addition to Eliquis anticoagulation.  He has been found to have significant LV dysfunction and his most recent echo Doppler study during his readmission for acute on chronic CHF showed EF of 26%.  He was in atrial flutter and ultimately underwent successful TEE guided cardioversion on Sep 20, 2021.  Subsequently, he has been maintaining sinus rhythm.  He is scheduled to see Dr. Crissie Sickles for consideration of possible future atrial flutter ablation.  Presently, he believes he is breathing well and denies significant recurrent chest pain or shortness of breath.  His blood pressure today is stable but low without orthostatic change at 102/60 supine and 102/64 standing.  Of note, in September 2022 LDL cholesterol was 114.  Does not appear that he is on any lipid-lowering therapy and I am initiating rosuvastatin at 20 mg daily.  He has a remote tobacco history but fortunately quit smoking 30 years ago.  His blood pressure today precludes further titration of medication for his guideline directed medical therapy.  I am recommending comprehensive metabolic panel and proBNP obtained.  Depending upon laboratory, he may be a candidate for low-dose spironolactone since he is only taking furosemide on a as needed basis.  I have recommended he follow-up and see how Meng, PA in 6 weeks with plans to see me in approximately 3 months for further evaluation.   Medication Adjustments/Labs and Tests Ordered: Current medicines are reviewed at length with the patient today.  Concerns regarding medicines are outlined above.  Medication changes, Labs and Tests ordered today are listed in the Patient Instructions below. Patient Instructions  Medication Instructions:  START back on Rosuvastatin 20 mg daily   *If you need a refill on your cardiac medications before your next appointment, please call your pharmacy*   Lab Work: CMET, Pro BNP today   If you have labs (blood work)  drawn today and your tests are completely normal, you will receive your results only by: [place] (if you have MyChart) OR A paper copy in the mail If you have any lab test that is abnormal or we need to change your treatment, we will call you to review the results.   Follow-Up: At [facility], you and your health needs are our priority.  As part of our continuing mission to provide you with exceptional heart care, we have created designated Provider Care Teams.  These Care Teams include your primary Cardiologist (physician) and Advanced Practice Providers (APPs -  Physician Assistants and Nurse Practitioners) who all work together to provide you with the care you need, when you need it.  We recommend signing up for the patient portal called "MyChart".  Sign up information is provided on this After Visit Summary.  MyChart is  used to connect with patients for Virtual Visits (Telemedicine).  Patients are able to view lab/test results, encounter notes, upcoming appointments, etc.  Non-urgent messages can be sent to your provider as well.   To learn more about what you can do with MyChart, go to [email or web address].    Your next appointment:   6 week(s)  The format for your next appointment:   In Person  Provider:   Almyra Deforest, PA-C    Then, Shelva Majestic, MD will plan to see you again in 4 month(s).            Signed, Shelva Majestic, MD  10/07/2021 11:00 AM    [place] [address] [place], [place], [place]  [address] Phone: [phone]

## 2021-09-27 NOTE — Patient Instructions (Signed)
Medication Instructions:  ?START back on Rosuvastatin 20 mg daily  ? ?*If you need a refill on your cardiac medications before your next appointment, please call your pharmacy* ? ? ?Lab Work: ?CMET, Pro BNP today  ? ?If you have labs (blood work) drawn today and your tests are completely normal, you will receive your results only by: ?MyChart Message (if you have MyChart) OR ?A paper copy in the mail ?If you have any lab test that is abnormal or we need to change your treatment, we will call you to review the results. ? ? ?Follow-Up: ?At [facility], you and your health needs are our priority.  As part of our continuing mission to provide you with exceptional heart care, we have created designated Provider Care Teams.  These Care Teams include your primary Cardiologist (physician) and Advanced Practice Providers (APPs -  Physician Assistants and Nurse Practitioners) who all work together to provide you with the care you need, when you need it. ? ?We recommend signing up for the patient portal called "MyChart".  Sign up information is provided on this After Visit Summary.  MyChart is used to connect with patients for Virtual Visits (Telemedicine).  Patients are able to view lab/test results, encounter notes, upcoming appointments, etc.  Non-urgent messages can be sent to your provider as well.   ?To learn more about what you can do with MyChart, go to [email or web address].   ? ?Your next appointment:   ?6 week(s) ? ?The format for your next appointment:   ?In Person ? ?Provider:   ?Azalee Course, PA-C    Then, [name], MD will plan to see you again in 4 month(s).  ? ? ? ? ? ? ? ? ?

## 2021-09-28 ENCOUNTER — Other Ambulatory Visit (INDEPENDENT_AMBULATORY_CARE_PROVIDER_SITE_OTHER): Payer: Medicare HMO

## 2021-09-28 DIAGNOSIS — I5043 Acute on chronic combined systolic (congestive) and diastolic (congestive) heart failure: Secondary | ICD-10-CM

## 2021-09-28 DIAGNOSIS — I5021 Acute systolic (congestive) heart failure: Secondary | ICD-10-CM | POA: Diagnosis not present

## 2021-09-29 LAB — COMPREHENSIVE METABOLIC PANEL
ALT: 21 IU/L (ref 0–44)
AST: 26 IU/L (ref 0–40)
Albumin/Globulin Ratio: 1.4 (ref 1.2–2.2)
Albumin: 3.9 g/dL (ref 3.7–4.7)
Alkaline Phosphatase: 153 IU/L — ABNORMAL HIGH (ref 44–121)
BUN/Creatinine Ratio: 24 (ref 10–24)
BUN: 30 mg/dL — ABNORMAL HIGH (ref 8–27)
Bilirubin Total: 0.4 mg/dL (ref 0.0–1.2)
CO2: 24 mmol/L (ref 20–29)
Calcium: 9 mg/dL (ref 8.6–10.2)
Chloride: 102 mmol/L (ref 96–106)
Creatinine, Ser: 1.27 mg/dL (ref 0.76–1.27)
Globulin, Total: 2.8 g/dL (ref 1.5–4.5)
Glucose: 88 mg/dL (ref 70–99)
Potassium: 4.9 mmol/L (ref 3.5–5.2)
Sodium: 139 mmol/L (ref 134–144)
Total Protein: 6.7 g/dL (ref 6.0–8.5)
eGFR: 59 mL/min/{1.73_m2} — ABNORMAL LOW (ref >59)

## 2021-09-29 LAB — PRO B NATRIURETIC PEPTIDE: NT-Pro BNP: 3278 pg/mL — ABNORMAL HIGH (ref 0–486)

## 2021-10-04 DIAGNOSIS — I4892 Unspecified atrial flutter: Secondary | ICD-10-CM | POA: Diagnosis not present

## 2021-10-04 DIAGNOSIS — E785 Hyperlipidemia, unspecified: Secondary | ICD-10-CM | POA: Diagnosis not present

## 2021-10-04 DIAGNOSIS — I10 Essential (primary) hypertension: Secondary | ICD-10-CM | POA: Diagnosis not present

## 2021-10-04 DIAGNOSIS — I5023 Acute on chronic systolic (congestive) heart failure: Secondary | ICD-10-CM | POA: Diagnosis not present

## 2021-10-04 DIAGNOSIS — I252 Old myocardial infarction: Secondary | ICD-10-CM | POA: Diagnosis not present

## 2021-10-05 ENCOUNTER — Other Ambulatory Visit (HOSPITAL_COMMUNITY): Payer: Self-pay

## 2021-10-05 ENCOUNTER — Telehealth (HOSPITAL_COMMUNITY): Payer: Self-pay

## 2021-10-05 NOTE — Telephone Encounter (Signed)
Pharmacy Transitions of Care Follow-up Telephone Call  Date of discharge: 09/21/21  Discharge Diagnosis: HF exacerbation  Medication changes made at discharge: START taking: Eliquis (apixaban)  potassium chloride SA (KLOR-CON M)  CHANGE how you take: furosemide (LASIX)  STOP taking: aspirin EC 81 MG tablet   Medication changes verified by the patient? Yes   Medication Accessibility:  Home Pharmacy: [facility]   Was the patient provided with refills on discharged medications? Yes   Have all prescriptions been transferred from [facility] to home pharmacy? Yes   Is the patient able to afford medications? Yes  Final Patient Assessment: -Declined patient education

## 2021-10-06 ENCOUNTER — Ambulatory Visit: Payer: Medicare HMO | Admitting: Internal Medicine

## 2021-10-06 ENCOUNTER — Encounter: Payer: Self-pay | Admitting: Internal Medicine

## 2021-10-06 VITALS — BP 100/60 | HR 66 | Ht 75.0 in | Wt 215.4 lb

## 2021-10-06 DIAGNOSIS — I483 Typical atrial flutter: Secondary | ICD-10-CM | POA: Diagnosis not present

## 2021-10-06 NOTE — Progress Notes (Signed)
HPI Timothy Munoz is referred by Dr. Elease Hashimoto for e valuation of atrial flutter. He is a pleasant 76 yo man with a h/o CAD, s/p CABG, with a h/o anterior STEMI. He has improved. He then developed atrial flutter and worsening CHF symptoms and was found to be out of rhythm and underwent DCCV. He has maintained NSR on GDMT. He has a h/o atrial fib remotely after his CABG.  No Known Allergies   Current Outpatient Medications  Medication Sig Dispense Refill   apixaban (ELIQUIS) 5 MG TABS tablet Take 1 tablet (5 mg total) by mouth 2 (two) times daily. 180 tablet 3   carvedilol (COREG) 6.25 MG tablet Take 1 tablet (6.25 mg total) by mouth 2 (two) times daily with a meal. 180 tablet 1   clopidogrel (PLAVIX) 75 MG tablet Take 1 tablet (75 mg total) by mouth daily. 30 tablet 11   dapagliflozin propanediol (FARXIGA) 10 MG TABS tablet Take 1 tablet (10 mg total) by mouth daily before breakfast. (Patient taking differently: Take 5 mg by mouth daily before breakfast.) 30 tablet 6   furosemide (LASIX) 40 MG tablet Take 1 tablet (40 mg total) by mouth daily as needed for fluid. 90 tablet 3   potassium chloride SA (KLOR-CON M) 20 MEQ tablet Take 1 tablet (20 mEq total) by mouth daily as needed (take along with furosemide). 90 tablet 3   primidone (MYSOLINE) 50 MG tablet Take 1-2 tablets daily for tremor (Patient taking differently: Take 100 mg by mouth daily.) 180 tablet 3   rosuvastatin (CRESTOR) 20 MG tablet Take 1 tablet (20 mg total) by mouth daily. 90 tablet 3   sacubitril-valsartan (ENTRESTO) 24-26 MG Take 1 tablet by mouth 2 (two) times daily. 60 tablet 3   No current facility-administered medications for this visit.     Past Medical History:  Diagnosis Date   Hyperlipidemia    mild    Hypertension    Knee pain    Obesity    Tremor    Tremor, essential 03/04/2019    ROS:   All systems reviewed and negative except as noted in the HPI.   Past Surgical History:  Procedure Laterality  Date   APPENDECTOMY     BACK SURGERY     CARDIOVERSION N/A 09/20/2021   Procedure: CARDIOVERSION;  Surgeon: Jake Bathe, MD;  Location: [facility] ENDOSCOPY;  Service: Cardiovascular;  Laterality: N/A;   CORONARY ARTERY BYPASS GRAFT N/A 01/20/2021   Procedure: CORONARY ARTERY BYPASS GRAFTING (CABG) X 3 ON PUMP, USING LEFT INTERNAL MAMMARY ARTERY AND LEFT ENDOSCOPIC GREATER SAPHENOUS VEIN HARVEST CONDUITS;  Surgeon: [name], MD;  Location: [facility] OR;  Service: Open Heart Surgery;  Laterality: N/A;   CORONARY/GRAFT ACUTE MI REVASCULARIZATION N/A 01/16/2021   Procedure: Coronary/Graft Acute MI Revascularization;  Surgeon: Lennette Bihari, MD;  Location: [facility] INVASIVE CV LAB;  Service: Cardiovascular;  Laterality: N/A;   ENDOVEIN HARVEST OF GREATER SAPHENOUS VEIN Left 01/20/2021   Procedure: ENDOVEIN HARVEST OF GREATER SAPHENOUS VEIN;  Surgeon: [name], MD;  Location[facility] OR;  Service: Open Heart Surgery;  Laterality: Left;   HEMORRHOID SURGERY     IABP INSERTION N/A 01/16/2021   Procedure: IABP Insertion;  Surgeon: Lennette Bihari, MD;  Location: [facility] INVASIVE CV LAB;  Service: Cardiovascular;  Laterality: N/A;   LEFT HEART CATH AND CORONARY ANGIOGRAPHY N/A 01/16/2021   Procedure: LEFT HEART CATH AND CORONARY ANGIOGRAPHY;  Surgeon: Lennette Bihari, MD;  Location[facility] INVASIVE  CV LAB;  Service: Cardiovascular;  Laterality: N/A;   TEE WITHOUT CARDIOVERSION N/A 01/20/2021   Procedure: TRANSESOPHAGEAL ECHOCARDIOGRAM (TEE);  Surgeon: [name], MD;  Location: [facility] OR;  Service: Open Heart Surgery;  Laterality: N/A;   TEE WITHOUT CARDIOVERSION N/A 09/20/2021   Procedure: TRANSESOPHAGEAL ECHOCARDIOGRAM (TEE);  Surgeon: Jake Bathe, MD;  Location: [facility] ENDOSCOPY;  Service: Cardiovascular;  Laterality: N/A;     Family History  Problem Relation Age of Onset   Hypertension Mother    Parkinson's disease Mother    Alzheimer's disease Mother    Hypertension Father    Suicidality Father       Social History   Socioeconomic History   Marital status: Single    Spouse name: Not on file   Number of children: Not on file   Years of education: Not on file   Highest education level: Not on file  Occupational History   Occupation: retired    Comment: Pharmacist, hospital  Tobacco Use   Smoking status: Former   Smokeless tobacco: Current    Types: Chew  Substance and Sexual Activity   Alcohol use: Yes    Comment: one beer every two months   Drug use: No   Sexual activity: Not on file  Other Topics Concern   Not on file  Social History Narrative   Right handed    Social Determinants of Health   Financial Resource Strain: Not on file  Food Insecurity: Not on file  Transportation Needs: Not on file  Physical Activity: Not on file  Stress: Not on file  Social Connections: Not on file  Intimate Partner Violence: Not on file     BP 100/60   Pulse 66   Ht 6\' 3"  (1.905 m)   Wt 215 lb 6.4 oz (97.7 kg)   SpO2 99%   BMI 26.92 kg/m   Physical Exam:  Well appearing NAD HEENT: Unremarkable Neck:  No JVD, no thyromegally Lymphatics:  No adenopathy Back:  No CVA tenderness Lungs:  Clear with no wheezes HEART:  Regular rate rhythm, no murmurs, no rubs, no clicks Abd:  soft, positive bowel sounds, no organomegally, no rebound, no guarding Ext:  2 plus pulses, no edema, no cyanosis, no clubbing Skin:  No rashes no nodules Neuro:  CN II through XII intact, motor grossly intact  EKG - reviewed  Assess/Plan:  Atrial flutter - He has had one episode. I have recommended he undergo watchful waiting. If he has more flutter, then I would recommend catheter ablation. CAD - he is s/p CABG and denies anginal symptoms.  Atrial fib - he will continue eliquis.  Chronic systolic heart failure - he will continue GDMT.  Braelyn Jenson,MD

## 2021-10-06 NOTE — Patient Instructions (Addendum)
Medication Instructions:  ?Your physician recommends that you continue on your current medications as directed. Please refer to the Current Medication list given to you today. ? ?Labwork: ?None ordered. ? ?Testing/Procedures: ?None ordered. ? ?Follow-Up: ?Your physician wants you to follow-up in: as needed ? ?Any Other Special Instructions Will Be Listed Below (If Applicable). ? ?If you need a refill on your cardiac medications before your next appointment, please call your pharmacy.  ? ?Important Information About Sugar ? ? ? ? ? ? ? ?

## 2021-10-07 ENCOUNTER — Encounter: Payer: Self-pay | Admitting: Cardiovascular Disease

## 2021-10-12 ENCOUNTER — Other Ambulatory Visit: Payer: Self-pay

## 2021-10-12 DIAGNOSIS — I5043 Acute on chronic combined systolic (congestive) and diastolic (congestive) heart failure: Secondary | ICD-10-CM

## 2021-10-12 MED ORDER — SPIRONOLACTONE 25 MG PO TABS: 12.5000 mg | ORAL_TABLET | ORAL | 1 refills | Status: DC

## 2021-10-19 ENCOUNTER — Other Ambulatory Visit: Payer: Self-pay

## 2021-10-19 ENCOUNTER — Other Ambulatory Visit: Payer: Medicare HMO

## 2021-10-19 DIAGNOSIS — I5043 Acute on chronic combined systolic (congestive) and diastolic (congestive) heart failure: Secondary | ICD-10-CM

## 2021-10-19 LAB — BASIC METABOLIC PANEL
BUN/Creatinine Ratio: 18 (ref 10–24)
BUN: 22 mg/dL (ref 8–27)
CO2: 25 mmol/L (ref 20–29)
Calcium: 9.5 mg/dL (ref 8.6–10.2)
Chloride: 104 mmol/L (ref 96–106)
Creatinine, Ser: 1.24 mg/dL (ref 0.76–1.27)
Glucose: 95 mg/dL (ref 70–99)
Potassium: 4.6 mmol/L (ref 3.5–5.2)
Sodium: 139 mmol/L (ref 134–144)
eGFR: 61 mL/min/{1.73_m2} (ref >59)

## 2021-10-24 DIAGNOSIS — H25813 Combined forms of age-related cataract, bilateral: Secondary | ICD-10-CM | POA: Diagnosis not present

## 2021-10-24 DIAGNOSIS — H52223 Regular astigmatism, bilateral: Secondary | ICD-10-CM | POA: Diagnosis not present

## 2021-10-24 DIAGNOSIS — H524 Presbyopia: Secondary | ICD-10-CM | POA: Diagnosis not present

## 2021-10-24 DIAGNOSIS — H5203 Hypermetropia, bilateral: Secondary | ICD-10-CM | POA: Diagnosis not present

## 2021-11-02 ENCOUNTER — Encounter (HOSPITAL_COMMUNITY): Payer: Self-pay

## 2021-11-02 ENCOUNTER — Encounter (HOSPITAL_COMMUNITY)
Admission: RE | Admit: 2021-11-02 | Discharge: 2021-11-02 | Disposition: A | Discharge status: Home / Self Care | Payer: Medicare HMO | Source: Ambulatory Visit | Attending: Cardiovascular Disease | Admitting: Cardiovascular Disease

## 2021-11-02 VITALS — BP 104/54 | HR 58 | Ht 73.25 in | Wt 214.1 lb

## 2021-11-02 DIAGNOSIS — I2102 ST elevation (STEMI) myocardial infarction involving left anterior descending coronary artery: Secondary | ICD-10-CM | POA: Insufficient documentation

## 2021-11-02 DIAGNOSIS — Z951 Presence of aortocoronary bypass graft: Secondary | ICD-10-CM | POA: Diagnosis not present

## 2021-11-02 HISTORY — DX: Atherosclerotic heart disease of native coronary artery without angina pectoris: I25.10

## 2021-11-02 NOTE — Progress Notes (Signed)
Cardiac Rehab Medication Review by a Nurse  Does the patient  feel that his/her medications are working for him/her?  yes  Has the patient been experiencing any side effects to the medications prescribed?  no  Does the patient measure his/her own blood pressure or blood glucose at home?  yes   Does the patient have any problems obtaining medications due to transportation or finances?   no  Understanding of regimen: good Understanding of indications: good Potential of compliance: good    Nurse comments: Timothy Munoz is taking his medications as prescribed and has a good understanding of what his medications are for. Timothy Munoz checks his blood pressures 2 to 3 times a week at home.    Arta Bruce [facility] RN 11/02/2021 10:48 AM

## 2021-11-06 ENCOUNTER — Ambulatory Visit: Payer: Medicare HMO | Admitting: Physician Assistant

## 2021-11-06 ENCOUNTER — Encounter: Payer: Self-pay | Admitting: Physician Assistant

## 2021-11-06 ENCOUNTER — Ambulatory Visit (HOSPITAL_COMMUNITY): Payer: Medicare HMO

## 2021-11-06 VITALS — BP 110/68 | HR 67 | Ht 74.0 in | Wt 212.8 lb

## 2021-11-06 DIAGNOSIS — I25810 Atherosclerosis of coronary artery bypass graft(s) without angina pectoris: Secondary | ICD-10-CM

## 2021-11-06 DIAGNOSIS — R350 Frequency of micturition: Secondary | ICD-10-CM

## 2021-11-06 DIAGNOSIS — E785 Hyperlipidemia, unspecified: Secondary | ICD-10-CM | POA: Diagnosis not present

## 2021-11-06 DIAGNOSIS — I483 Typical atrial flutter: Secondary | ICD-10-CM | POA: Diagnosis not present

## 2021-11-06 DIAGNOSIS — I10 Essential (primary) hypertension: Secondary | ICD-10-CM

## 2021-11-06 DIAGNOSIS — I255 Ischemic cardiomyopathy: Secondary | ICD-10-CM | POA: Diagnosis not present

## 2021-11-06 MED ORDER — TAMSULOSIN HCL 0.4 MG PO CAPS: 0.4000 mg | ORAL_CAPSULE | Freq: Every day | ORAL | 4 refills | Status: DC

## 2021-11-06 NOTE — Progress Notes (Signed)
Cardiology Office Note:    Date:  11/06/2021   ID:  Timothy Munoz, DOB 12-Oct-1945, MRN [id number]  PCP:  [name], MD   [facility] Providers Cardiologist:  [name], MD     Referring MD: [name], MD   Chief Complaint  Patient presents with   Follow-up    Seen for Dr. [name]    History of Present Illness:    Timothy Munoz is a 76 y.o. male with a hx of CAD, hyperlipidemia, hypertension and history of essential tremor.  Patient had anterolateral STEMI in September 2022 and underwent balloon angioplasty of ostial LAD.  Hospital course was complicated by cardiogenic shock with reduced EF of 35%.  He required intra-aortic balloon pump and ultimately underwent CABG x3 on 01/20/2021 by Dr. Dorris Fetch.  Postprocedure, he did did develop postop atrial fibrillation requiring amiodarone and was treated with digoxin.  Echocardiogram obtained on 01/25/2021 showed EF has improved to 40 to 45%, grade 2 DD.  He was treated with Lasix for volume overload.  On follow-up in January, Lasix was changed to as needed.  Repeat echocardiogram in January 2023 showed EF 35 to 40%, PASP 53.4 mmHg, moderate to severe MR.  He was started on Farxiga with subsequent dose reduction due to creatinine elevation.  I last saw the patient in April and the transition his ARB to [facility].  He was admitted in May 2023 due to volume overload and underwent IV diuresis.  Echocardiogram showed further reduction in LVEF at 26% on 09/18/2021.  He was in atrial flutter and underwent TEE guided DCCV.  Spironolactone was added on 5/27.  proBNP at the time was 3278.  Since his last visit with Dr. [name], patient has been seen by Dr. [name] on 10/06/2021, given a single episode of atrial flutter, it was recommended to continue monitoring.  If further episode occurs in the future, Dr. [name] would recommend A-flutter ablation.  Patient presents today for follow-up.  He denies any recent chest pain or worsening dyspnea.  Renal  function is stable on repeat blood work.  Based on vital signs obtained during recent cardiac rehab, blood pressure and heart rate is very well controlled.  His only complaint is he gets up around 7 times in a single night to go to the bathroom to urinate.  He feels he is not emptying the bladder. I will add Flomax to his medical regimen.  If symptom does not improve, may need to see urologist.  I doubt his symptom is truly coming from every other day of spironolactone.  Due to the complaint of urinary frequency, I did not try to increase spironolactone to daily dosing.  He can follow-up with Dr. [name] in 3 to 4 months.  May consider repeat echocardiogram at that time if he is still holding sinus rhythm to see if EF improves.  Otherwise, his heart rate is very well controlled and appears to be quite regular on physical exam.    Past Medical History:  Diagnosis Date   Coronary artery disease    Hyperlipidemia    mild    Hypertension    Knee pain    Obesity    Tremor    Tremor, essential 03/04/2019    Past Surgical History:  Procedure Laterality Date   APPENDECTOMY     BACK SURGERY     CARDIAC CATHETERIZATION     CARDIOVERSION N/A 09/20/2021   Procedure: CARDIOVERSION;  Surgeon: Jake Bathe, MD;  Location: [facility] ENDOSCOPY;  Service:  Cardiovascular;  Laterality: N/A;   CORONARY ARTERY BYPASS GRAFT N/A 01/20/2021   Procedure: CORONARY ARTERY BYPASS GRAFTING (CABG) X 3 ON PUMP, USING LEFT INTERNAL MAMMARY ARTERY AND LEFT ENDOSCOPIC GREATER SAPHENOUS VEIN HARVEST CONDUITS;  Surgeon: [name], MD;  Location: [facility] OR;  Service: Open Heart Surgery;  Laterality: N/A;   CORONARY/GRAFT ACUTE MI REVASCULARIZATION N/A 01/16/2021   Procedure: Coronary/Graft Acute MI Revascularization;  Surgeon: Lennette Bihari, MD;  Location: [facility] INVASIVE CV LAB;  Service: Cardiovascular;  Laterality: N/A;   ENDOVEIN HARVEST OF GREATER SAPHENOUS VEIN Left 01/20/2021   Procedure: ENDOVEIN HARVEST OF GREATER  SAPHENOUS VEIN;  Surgeon: [name], MD;  Location: [facility] OR;  Service: Open Heart Surgery;  Laterality: Left;   HEMORRHOID SURGERY     IABP INSERTION N/A 01/16/2021   Procedure: IABP Insertion;  Surgeon: Lennette Bihari, MD;  Location: [facility] INVASIVE CV LAB;  Service: Cardiovascular;  Laterality: N/A;   LEFT HEART CATH AND CORONARY ANGIOGRAPHY N/A 01/16/2021   Procedure: LEFT HEART CATH AND CORONARY ANGIOGRAPHY;  Surgeon: Lennette Bihari, MD;  Location: [facility] INVASIVE CV LAB;  Service: Cardiovascular;  Laterality: N/A;   TEE WITHOUT CARDIOVERSION N/A 01/20/2021   Procedure: TRANSESOPHAGEAL ECHOCARDIOGRAM (TEE);  Surgeon: [name], MD;  Location: [facility] OR;  Service: Open Heart Surgery;  Laterality: N/A;   TEE WITHOUT CARDIOVERSION N/A 09/20/2021   Procedure: TRANSESOPHAGEAL ECHOCARDIOGRAM (TEE);  Surgeon: Jake Bathe, MD;  Location: [facility] ENDOSCOPY;  Service: Cardiovascular;  Laterality: N/A;    Current Medications: Current Meds  Medication Sig   apixaban (ELIQUIS) 5 MG TABS tablet Take 1 tablet (5 mg total) by mouth 2 (two) times daily.   carvedilol (COREG) 6.25 MG tablet Take 1 tablet (6.25 mg total) by mouth 2 (two) times daily with a meal.   clopidogrel (PLAVIX) 75 MG tablet Take 1 tablet (75 mg total) by mouth daily.   dapagliflozin propanediol (FARXIGA) 10 MG TABS tablet Take 1 tablet (10 mg total) by mouth daily before breakfast. (Patient taking differently: Take 5 mg by mouth daily before breakfast.)   furosemide (LASIX) 40 MG tablet Take 1 tablet (40 mg total) by mouth daily as needed for fluid.   potassium chloride SA (KLOR-CON M) 20 MEQ tablet Take 1 tablet (20 mEq total) by mouth daily as needed (take along with furosemide).   primidone (MYSOLINE) 50 MG tablet Take 1-2 tablets daily for tremor (Patient taking differently: Take 100 mg by mouth daily.)   rosuvastatin (CRESTOR) 20 MG tablet Take 1 tablet (20 mg total) by mouth daily.   sacubitril-valsartan (ENTRESTO)  24-26 MG Take 1 tablet by mouth 2 (two) times daily.   spironolactone (ALDACTONE) 25 MG tablet Take 0.5 tablets (12.5 mg total) by mouth every other day.   tamsulosin (FLOMAX) 0.4 MG CAPS capsule Take 1 capsule (0.4 mg total) by mouth daily after breakfast.     Allergies:   Patient has no known allergies.   Social History   Socioeconomic History   Marital status: Single    Spouse name: Not on file   Number of children: 2   Years of education: 13   Highest education level: High school graduate  Occupational History   Occupation: retired    Comment: Pharmacist, hospital  Tobacco Use   Smoking status: Former   Smokeless tobacco: Current    Types: Chew  Substance and Sexual Activity   Alcohol use: Yes    Comment: one beer every two months   Drug use:  No   Sexual activity: Not on file  Other Topics Concern   Not on file  Social History Narrative   Right handed    Social Determinants of Health   Financial Resource Strain: Not on file  Food Insecurity: Not on file  Transportation Needs: Not on file  Physical Activity: Not on file  Stress: Not on file  Social Connections: Not on file     Family History: The patient's family history includes Alzheimer's disease in his mother; Hypertension in his father and mother; Parkinson's disease in his mother; Suicidality in his father.  ROS:   Please see the history of present illness.     All other systems reviewed and are negative.  EKGs/Labs/Other Studies Reviewed:    The following studies were reviewed today:    EKG:  EKG is not ordered today.  Physical exam revealed regular rate, suggestive of likely sinus rhythm.  Recent Labs: 09/17/2021: B Natriuretic Peptide 824.1; Hemoglobin 11.6; Platelets 303; TSH 5.397 09/18/2021: Magnesium 2.2 09/27/2021: ALT 21; NT-Pro BNP 3,278 10/19/2021: BUN 22; Creatinine, Ser 1.24; Potassium 4.6; Sodium 139  Recent Lipid Panel    Component Value Date/Time   CHOL 163 01/16/2021 0739   TRIG 33  01/16/2021 0739   HDL 42 01/16/2021 0739   CHOLHDL 3.9 01/16/2021 0739   VLDL 7 01/16/2021 0739   LDLCALC 114 (H) 01/16/2021 0739     Risk Assessment/Calculations:    CHA2DS2-VASc Score = 5   This indicates a 7.2% annual risk of stroke. The patient's score is based upon: CHF History: 1 HTN History: 1 Diabetes History: 0 Stroke History: 0 Vascular Disease History: 1 Age Score: 2 Gender Score: 0          Physical Exam:    VS:  BP 110/68   Pulse 67   Ht 6\' 2"  (1.88 m)   Wt 212 lb 12.8 oz (96.5 kg)   SpO2 93%   BMI 27.32 kg/m     Wt Readings from Last 3 Encounters:  11/06/21 212 lb 12.8 oz (96.5 kg)  11/02/21 214 lb 1.1 oz (97.1 kg)  10/06/21 215 lb 6.4 oz (97.7 kg)     GEN:  Well nourished, well developed in no acute distress HEENT: Normal NECK: No JVD; No carotid bruits LYMPHATICS: No lymphadenopathy CARDIAC: RRR, no murmurs, rubs, gallops RESPIRATORY:  Clear to auscultation without rales, wheezing or rhonchi  ABDOMEN: Soft, non-tender, non-distended MUSCULOSKELETAL:  No edema; No deformity  SKIN: Warm and dry NEUROLOGIC:  Alert and oriented x 3 PSYCHIATRIC:  Normal affect   ASSESSMENT:    1. Coronary artery disease involving coronary bypass graft of native heart without angina pectoris   2. Primary hypertension   3. Hyperlipidemia LDL goal <70   4. Ischemic cardiomyopathy   5. Typical atrial flutter (HCC)   6. Urinary frequency    PLAN:    In order of problems listed above:  CAD s/p CABG: Continue Plavix and Crestor  Hypertension: Blood pressure well controlled  Hyperlipidemia: On Crestor  Ischemic cardiomyopathy: Recent echocardiogram shows a drop in the ejection fraction down to 26%.  Continue carvedilol, Entresto, spironolactone was added during last office visit at every other day.  Follow-up blood work showed stable renal function  Atrial flutter: Patient has history of postop atrial fibrillation after bypass surgery, recently, he was also  noted to have atrial flutter and underwent TEE guided DCCV.  He was seen by Dr. [name] who recommended continued watchful waiting given the solitary episode, if  he has any recurrence of atrial flutter in the future, may consider ablation  Urinary frequency: He complains of getting up 7 times per night to go to the bathroom.  I suspect he has BPH.  We will start him on Flomax 0.4 mg daily.           Medication Adjustments/Labs and Tests Ordered: Current medicines are reviewed at length with the patient today.  Concerns regarding medicines are outlined above.  No orders of the defined types were placed in this encounter.  Meds ordered this encounter  Medications   tamsulosin (FLOMAX) 0.4 MG CAPS capsule    Sig: Take 1 capsule (0.4 mg total) by mouth daily after breakfast.    Dispense:  30 capsule    Refill:  4    Patient Instructions  Medication Instructions:  START Tamsulosin (Flomax) 0.4 mg daily after breakfast  *If you need a refill on your cardiac medications before your next appointment, please call your pharmacy*  Lab Work: NONE ordered at this time of appointment   If you have labs (blood work) drawn today and your tests are completely normal, you will receive your results only by: MyChart Message (if you have MyChart) OR A paper copy in the mail If you have any lab test that is abnormal or we need to change your treatment, we will call you to review the results.  Testing/Procedures: NONE ordered at this time of appointment   Follow-Up: At [facility], you and your health needs are our priority.  As part of our continuing mission to provide you with exceptional heart care, we have created designated Provider Care Teams.  These Care Teams include your primary Cardiologist (physician) and Advanced Practice Providers (APPs -  Physician Assistants and Nurse Practitioners) who all work together to provide you with the care you need, when you need it.   Your next  appointment:   3-4 month(s)  The format for your next appointment:   In Person  Provider:   [name], MD  or  APP on a day Dr. [name] is in office          Other Instructions   Important Information About Sugar         Signed, Azalee Course, [place]  11/06/2021 10:01 AM    [facility]

## 2021-11-08 ENCOUNTER — Encounter (HOSPITAL_COMMUNITY)
Admission: RE | Admit: 2021-11-08 | Discharge: 2021-11-08 | Disposition: A | Discharge status: Home / Self Care | Payer: Medicare HMO | Source: Ambulatory Visit | Attending: Cardiovascular Disease | Admitting: Cardiovascular Disease

## 2021-11-08 DIAGNOSIS — Z951 Presence of aortocoronary bypass graft: Secondary | ICD-10-CM | POA: Diagnosis not present

## 2021-11-08 DIAGNOSIS — I2102 ST elevation (STEMI) myocardial infarction involving left anterior descending coronary artery: Secondary | ICD-10-CM

## 2021-11-08 NOTE — Progress Notes (Signed)
Pt started the group Intensive cardiac rehab session today.  Pt tolerated  exercise without difficulty. VSS, telemetry-SR, asymptomatic.  PSYCHOSOCIAL ASSESSMENT:  PHQ-0. Pt exhibits positive coping skills, hopeful outlook with supportive family. No psychosocial needs identified at this time, no psychosocial interventions necessary. Pt oriented to applying telemetry monitor and removing, oriented to exercise equipment and gym/session routine. Education sessions will follow exercise sessions and explained routine on Monday, Wednesday, Friday education topics. Understanding verbalized.

## 2021-11-08 NOTE — Progress Notes (Signed)
Cardiac Individual Treatment Plan  Patient Details  Name: Timothy Munoz MRN: [id number] Date of Birth: [birth year] Referring Provider:   Flowsheet Row INTENSIVE CARDIAC REHAB ORIENT from 11/02/2021 in [place]  Referring Provider Dr. 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 (Patient taking differently: Take 5 mg by mouth daily before breakfast.), Disp: 30 tablet, Rfl: 6   furosemide (LASIX) 40 MG tablet, Take 1 tablet (40 mg total) by mouth daily as needed for fluid., Disp: 90 tablet, Rfl: 3   potassium chloride SA (KLOR-CON M) 20 MEQ tablet, Take 1 tablet (20 mEq total) by mouth daily as needed (take along with furosemide)., Disp: 90 tablet, Rfl: 3   primidone (MYSOLINE) 50 MG tablet, Take 1-2 tablets daily for tremor (Patient taking differently: Take 100 mg by mouth daily.), Disp: 180 tablet, Rfl: 3   rosuvastatin (CRESTOR) 20 MG tablet, Take 1 tablet (20 mg total) by mouth daily., Disp: 90 tablet, Rfl: 3   sacubitril-valsartan (ENTRESTO) 24-26 MG, Take 1 tablet by mouth 2 (two) times daily., Disp: 60 tablet, Rfl: 3   spironolactone (ALDACTONE) 25 MG tablet, Take 0.5 tablets (12.5 mg total) by mouth every other  day., Disp: 30 tablet, Rfl: 1   tamsulosin (FLOMAX) 0.4 MG CAPS capsule, Take 1 capsule (0.4 mg total) by mouth daily after breakfast., Disp: 30 capsule, Rfl: 4  Past Medical History: Past Medical History:  Diagnosis Date   Coronary artery disease    Hyperlipidemia    mild    Hypertension    Knee pain    Obesity    Tremor    Tremor, essential 03/04/2019    Tobacco Use: Social History   Tobacco Use  Smoking Status Former  Smokeless Tobacco Current   Types: Chew    Labs: Review Flowsheet  More data exists      Latest Ref Rng & Units 01/22/2021 01/23/2021 01/24/2021 01/25/2021  Labs for ITP Cardiac and Pulmonary Rehab  O2 Saturation % 57.3  56.3  C 45.2  51.3  49.6  48.1  61.2       01/26/2021  Labs for ITP Cardiac and Pulmonary Rehab  O2 Saturation 68.7     Details      C Corrected result   Multiple values from one day are sorted in reverse-chronological order         Capillary Blood Glucose: Lab Results  Component Value Date   GLUCAP 113 (H) 01/27/2021   GLUCAP 124 (H) 01/26/2021   GLUCAP 98 01/26/2021   GLUCAP 99 01/26/2021   GLUCAP 130 (H) 01/26/2021     Exercise Target Goals: Exercise Program Goal: Individual exercise prescription  set using results from initial 6 min walk test and THRR while considering  patient's activity barriers and safety.   Exercise Prescription Goal: Initial exercise prescription builds to 30-45 minutes a day of aerobic activity, 2-3 days per week.  Home exercise guidelines will be given to patient during program as part of exercise prescription that the participant will acknowledge.  Activity Barriers & Risk Stratification:  Activity Barriers & Cardiac Risk Stratification - 11/02/21 1051       Activity Barriers & Cardiac Risk Stratification   Activity Barriers Balance Concerns;Deconditioning    Cardiac Risk Stratification High   Under 5 MET's            6 Minute Walk:  6 Minute Walk     Row Name 11/02/21 1048          6 Minute Walk   Phase Initial     Distance 1600 feet     Walk Time 6 minutes     # of Rest Breaks 0     MPH 3.03     METS 3.2     RPE 11     Perceived Dyspnea  0     VO2 Peak 11.21     Symptoms No     Resting HR 58 bpm     Resting BP 104/54     Resting Oxygen Saturation  99 %     Exercise Oxygen Saturation  during 6 min walk 96 %     Max Ex. HR 105 bpm     Max Ex. BP 110/68     2 Minute Post BP 124/62              Oxygen Initial Assessment:   Oxygen Re-Evaluation:   Oxygen Discharge (Final Oxygen Re-Evaluation):   Initial Exercise Prescription:  Initial Exercise Prescription - 11/02/21 1000       Date of Initial Exercise RX and Referring Provider   Date 11/02/21    Referring Provider Dr. Shelva Majestic MD    Expected Discharge Date 12/29/21      NuStep   Level 2    SPM 80    Minutes 15    METs 2      Arm Ergometer   Level 1.5    RPM 85    Minutes 15    METs 1.7      Prescription Details   Frequency (times per week) 3    Duration Progress to 30 minutes of continuous aerobic without signs/symptoms of physical distress      Intensity   THRR 40-80% of Max Heartrate 58-116    Ratings of Perceived Exertion 11-13    Perceived Dyspnea 0-4      Progression   Progression Continue progressive overload as per policy without signs/symptoms or physical distress.      Resistance Training   Training Prescription Yes    Weight 4    Reps 10-15             Perform Capillary Blood Glucose checks as needed.  Exercise Prescription Changes:   Exercise Prescription Changes     Row Name 11/08/21 0825             Response to Exercise   Blood Pressure (Admit) 120/64       Blood Pressure (Exercise) 134/82       Blood Pressure (Exit) 102/60       Heart Rate (Admit) 70 bpm       Heart Rate (Exercise) 105 bpm  Heart Rate (Exit) 70 bpm       Rating of Perceived Exertion (Exercise) 11       Perceived Dyspnea (Exercise) 0       Symptoms 0        Comments Pt first day in the CRP2 program       Duration Progress to 30 minutes of  aerobic without signs/symptoms of physical distress       Intensity THRR unchanged         Progression   Progression Continue to progress workloads to maintain intensity without signs/symptoms of physical distress.       Average METs 2.35         Resistance Training   Training Prescription No         NuStep   Level 2       SPM 80       Minutes 15       METs 2.6         Arm Ergometer   Level 1.5       RPM 85       Minutes 15       METs 2                Exercise Comments:   Exercise Comments     Row Name 11/08/21 0831           Exercise Comments Pt first day in the CRP2 program. Pt tolerated exercise well with an average MET level of 2.35. Pt is learning his THRR, RPE, and ExRx.                Exercise Goals and Review:   Exercise Goals     Row Name 11/02/21 1057             Exercise Goals   Increase Physical Activity Yes       Intervention Provide advice, education, support and counseling about physical activity/exercise needs.;Develop an individualized exercise prescription for aerobic and resistive training based on initial evaluation findings, risk stratification, comorbidities and participant's personal goals.       Expected Outcomes Short Term: Attend rehab on a regular basis to increase amount of physical activity.;Long Term: Add in home exercise to make exercise part of routine and to increase amount of physical activity.;Long Term: Exercising regularly at least 3-5 days a week.       Increase Strength and Stamina Yes       Intervention Provide advice, education, support and counseling about physical activity/exercise needs.;Develop an individualized exercise prescription for aerobic and resistive training based on initial evaluation findings, risk stratification, comorbidities and participant's personal goals.       Expected Outcomes Short Term: Increase  workloads from initial exercise prescription for resistance, speed, and METs.;Short Term: Perform resistance training exercises routinely during rehab and add in resistance training at home;Long Term: Improve cardiorespiratory fitness, muscular endurance and strength as measured by increased METs and functional capacity (6MWT)       Able to understand and use rate of perceived exertion (RPE) scale Yes       Intervention Provide education and explanation on how to use RPE scale       Expected Outcomes Short Term: Able to use RPE daily in rehab to express subjective intensity level;Long Term:  Able to use RPE to guide intensity level when exercising independently       Knowledge and understanding of Target Heart Rate Range (THRR) Yes  Intervention Provide education and explanation of THRR including how the numbers were predicted and where they are located for reference       Expected Outcomes Short Term: Able to state/look up THRR;Long Term: Able to use THRR to govern intensity when exercising independently;Short Term: Able to use daily as guideline for intensity in rehab       Understanding of Exercise Prescription Yes       Intervention Provide education, explanation, and written materials on patient's individual exercise prescription       Expected Outcomes Short Term: Able to explain program exercise prescription;Long Term: Able to explain home exercise prescription to exercise independently                Exercise Goals Re-Evaluation :  Exercise Goals Re-Evaluation     Row Name 11/08/21 0829             Exercise Goal Re-Evaluation   Exercise Goals Review Increase Physical Activity;Increase Strength and Stamina;Able to understand and use rate of perceived exertion (RPE) scale;Knowledge and understanding of Target Heart Rate Range (THRR);Understanding of Exercise Prescription       Comments Pt first day in the CRP2 program. Pt tolerated exercise well with an average MET level of  2.35. Pt is learning his THRR, RPE, and ExRx.       Expected Outcomes Will continue to monitor pt and progress workloads as tolerated without sign or symptom                Discharge Exercise Prescription (Final Exercise Prescription Changes):  Exercise Prescription Changes - 11/08/21 0825       Response to Exercise   Blood Pressure (Admit) 120/64    Blood Pressure (Exercise) 134/82    Blood Pressure (Exit) 102/60    Heart Rate (Admit) 70 bpm    Heart Rate (Exercise) 105 bpm    Heart Rate (Exit) 70 bpm    Rating of Perceived Exertion (Exercise) 11    Perceived Dyspnea (Exercise) 0    Symptoms 0    Comments Pt first day in the CRP2 program    Duration Progress to 30 minutes of  aerobic without signs/symptoms of physical distress    Intensity THRR unchanged      Progression   Progression Continue to progress workloads to maintain intensity without signs/symptoms of physical distress.    Average METs 2.35      Resistance Training   Training Prescription No      NuStep   Level 2    SPM 80    Minutes 15    METs 2.6      Arm Ergometer   Level 1.5    RPM 85    Minutes 15    METs 2             Nutrition:  Target Goals: Understanding of nutrition guidelines, daily intake of sodium <1546m, cholesterol <2068m calories 30% from fat and 7% or less from saturated fats, daily to have 5 or more servings of fruits and vegetables.  Biometrics:  Pre Biometrics - 11/02/21 1058       Pre Biometrics   Height 6' 1.25" (1.861 m)    Weight 97.1 kg    Waist Circumference 41 inches    Hip Circumference 43.75 inches    Waist to Hip Ratio 0.94 %    BMI (Calculated) 28.04    Triceps Skinfold 11 mm    % Body Fat 26.6 %    Grip Strength 37  kg    Flexibility --   Pt unable to reach   Single Leg Stand 7.7 seconds              Nutrition Therapy Plan and Nutrition Goals:   Nutrition Assessments:  MEDIFICTS Score Key: ?70 Need to make dietary changes  40-70 Heart  Healthy Diet ? 40 Therapeutic Level Cholesterol Diet    Picture Your Plate Scores: <54 Unhealthy dietary pattern with much room for improvement. 41-50 Dietary pattern unlikely to meet recommendations for good health and room for improvement. 51-60 More healthful dietary pattern, with some room for improvement.  >60 Healthy dietary pattern, although there may be some specific behaviors that could be improved.    Nutrition Goals Re-Evaluation:   Nutrition Goals Re-Evaluation:   Nutrition Goals Discharge (Final Nutrition Goals Re-Evaluation):   Psychosocial: Target Goals: Acknowledge presence or absence of significant depression and/or stress, maximize coping skills, provide positive support system. Participant is able to verbalize types and ability to use techniques and skills needed for reducing stress and depression.  Initial Review & Psychosocial Screening:  Initial Psych Review & Screening - 11/02/21 1510       Initial Review   Current issues with None Identified      Family Dynamics   Good Support System? Yes   Alvester Chou lives alone. Alvester Chou has his daughter for support     Barriers   Psychosocial barriers to participate in program There are no identifiable barriers or psychosocial needs.      Screening Interventions   Interventions Encouraged to exercise             Quality of Life Scores:  Quality of Life - 11/02/21 1139       Quality of Life   Select Quality of Life      Quality of Life Scores   Health/Function Pre 23.82 %    Socioeconomic Pre 25.5 %    Psych/Spiritual Pre 23 %    Family Pre 22.8 %    GLOBAL Pre 23.82 %            Scores of 19 and below usually indicate a poorer quality of life in these areas.  A difference of  2-3 points is a clinically meaningful difference.  A difference of 2-3 points in the total score of the Quality of Life Index has been associated with significant improvement in overall quality of life, self-image, physical  symptoms, and general health in studies assessing change in quality of life.  PHQ-9: Review Flowsheet       11/02/2021  Depression screen PHQ 2/9  Decreased Interest 0  Down, Depressed, Hopeless 0  PHQ - 2 Score 0   Interpretation of Total Score  Total Score Depression Severity:  1-4 = Minimal depression, 5-9 = Mild depression, 10-14 = Moderate depression, 15-19 = Moderately severe depression, 20-27 = Severe depression   Psychosocial Evaluation and Intervention:   Psychosocial Re-Evaluation:   Psychosocial Discharge (Final Psychosocial Re-Evaluation):   Vocational Rehabilitation: Provide vocational rehab assistance to qualifying candidates.   Vocational Rehab Evaluation & Intervention:  Vocational Rehab - 11/02/21 1512       Initial Vocational Rehab Evaluation & Intervention   Assessment shows need for Vocational Rehabilitation No   Alvester Chou is retired and does not need vocational rehab at this time            Education: Education Goals: Education classes will be provided on a weekly basis, covering required topics. Participant will state understanding/return demonstration  of topics presented.    Education - 11/08/21 0900       Education   Cardiac Education Topics Pritikin    Sales executive    Weekly Topic Satisfying Salads and Dressings    Instruction Review Code 1- Verbalizes Understanding    Class Start Time 0813    Class Stop Time 0900    Class Time Calculation (min) 47 min             Core Videos: Exercise    Move It!  Clinical staff conducted group or individual video education with verbal and written material and guidebook.  Patient learns the recommended Pritikin exercise program. Exercise with the goal of living a long, healthy life. Some of the health benefits of exercise include controlled diabetes, healthier blood pressure levels, improved cholesterol levels, improved heart and lung capacity,  improved sleep, and better body composition. Everyone should speak with their doctor before starting or changing an exercise routine.  Biomechanical Limitations Clinical staff conducted group or individual video education with verbal and written material and guidebook.  Patient learns how biomechanical limitations can impact exercise and how we can mitigate and possibly overcome limitations to have an impactful and balanced exercise routine.  Body Composition Clinical staff conducted group or individual video education with verbal and written material and guidebook.  Patient learns that body composition (ratio of muscle mass to fat mass) is a key component to assessing overall fitness, rather than body weight alone. Increased fat mass, especially visceral belly fat, can put [place] at increased risk for metabolic syndrome, type 2 diabetes, heart disease, and even death. It is recommended to combine diet and exercise (cardiovascular and resistance training) to improve your body composition. Seek guidance from your physician and exercise physiologist before implementing an exercise routine.  Exercise Action Plan Clinical staff conducted group or individual video education with verbal and written material and guidebook.  Patient learns the recommended strategies to achieve and enjoy long-term exercise adherence, including variety, self-motivation, self-efficacy, and positive decision making. Benefits of exercise include fitness, good health, weight management, more energy, better sleep, less stress, and overall well-being.  Medical   Heart Disease Risk Reduction Clinical staff conducted group or individual video education with verbal and written material and guidebook.  Patient learns our heart is our most vital organ as it circulates oxygen, nutrients, white blood cells, and hormones throughout the entire body, and carries waste away. Data supports a plant-based eating plan like the Pritikin Program for  its effectiveness in slowing progression of and reversing heart disease. The video provides a number of recommendations to address heart disease.   Metabolic Syndrome and Belly Fat  Clinical staff conducted group or individual video education with verbal and written material and guidebook.  Patient learns what metabolic syndrome is, how it leads to heart disease, and how one can reverse it and keep it from coming back. You have metabolic syndrome if you have 3 of the following 5 criteria: abdominal obesity, high blood pressure, high triglycerides, low HDL cholesterol, and high blood sugar.  Hypertension and Heart Disease Clinical staff conducted group or individual video education with verbal and written material and guidebook.  Patient learns that high blood pressure, or hypertension, is very common in the [place]. Hypertension is largely due to excessive salt intake, but other important risk factors include being overweight, physical inactivity, drinking too much alcohol, smoking, and not eating enough potassium  from fruits and vegetables. High blood pressure is a leading risk factor for heart attack, stroke, congestive heart failure, dementia, kidney failure, and premature death. Long-term effects of excessive salt intake include stiffening of the arteries and thickening of heart muscle and organ damage. Recommendations include ways to reduce hypertension and the risk of heart disease.  Diseases of Our Time - Focusing on Diabetes Clinical staff conducted group or individual video education with verbal and written material and guidebook.  Patient learns why the best way to stop diseases of our time is prevention, through food and other lifestyle changes. Medicine (such as prescription pills and surgeries) is often only a Band-Aid on the problem, not a long-term solution. Most common diseases of our time include obesity, type 2 diabetes, hypertension, heart disease, and cancer. The Pritikin  Program is recommended and has been proven to help reduce, reverse, and/or prevent the damaging effects of metabolic syndrome.  Nutrition   Overview of the Pritikin Eating Plan  Clinical staff conducted group or individual video education with verbal and written material and guidebook.  Patient learns about the [place] for disease risk reduction. The [place] emphasizes a wide variety of unrefined, minimally-processed carbohydrates, like fruits, vegetables, whole grains, and legumes. Go, Caution, and Stop food choices are explained. Plant-based and lean animal proteins are emphasized. Rationale provided for low sodium intake for blood pressure control, low added sugars for blood sugar stabilization, and low added fats and oils for coronary artery disease risk reduction and weight management.  Calorie Density  Clinical staff conducted group or individual video education with verbal and written material and guidebook.  Patient learns about calorie density and how it impacts the Pritikin Eating Plan. Knowing the characteristics of the food you choose will help you decide whether those foods will lead to weight gain or weight loss, and whether you want to consume more or less of them. Weight loss is usually a side effect of the Pritikin Eating Plan because of its focus on low calorie-dense foods.  Label Reading  Clinical staff conducted group or individual video education with verbal and written material and guidebook.  Patient learns about the Pritikin recommended label reading guidelines and corresponding recommendations regarding calorie density, added sugars, sodium content, and whole grains.  Dining Out - Part 1  Clinical staff conducted group or individual video education with verbal and written material and guidebook.  Patient learns that restaurant meals can be sabotaging because they can be so high in calories, fat, sodium, and/or sugar. Patient learns recommended  strategies on how to positively address this and avoid unhealthy pitfalls.  Facts on Fats  Clinical staff conducted group or individual video education with verbal and written material and guidebook.  Patient learns that lifestyle modifications can be just as effective, if not more so, as many medications for lowering your risk of heart disease. A Pritikin lifestyle can help to reduce your risk of inflammation and atherosclerosis (cholesterol build-up, or plaque, in the artery walls). Lifestyle interventions such as dietary choices and physical activity address the cause of atherosclerosis. A review of the types of fats and their impact on blood cholesterol levels, along with dietary recommendations to reduce fat intake is also included.  Nutrition Action Plan  Clinical staff conducted group or individual video education with verbal and written material and guidebook.  Patient learns how to incorporate Pritikin recommendations into their lifestyle. Recommendations include planning and keeping personal health goals in mind as an important part of  their success.  Healthy Mind-Set    Healthy Minds, Bodies, Hearts  Clinical staff conducted group or individual video education with verbal and written material and guidebook.  Patient learns how to identify when they are stressed. Video will discuss the impact of that stress, as well as the many benefits of stress management. Patient will also be introduced to stress management techniques. The way we think, act, and feel has an impact on our hearts.  How Our Thoughts Can Heal Our Hearts  Clinical staff conducted group or individual video education with verbal and written material and guidebook.  Patient learns that negative thoughts can cause depression and anxiety. This can result in negative lifestyle behavior and serious health problems. Cognitive behavioral therapy is an effective method to help control our thoughts in order to change and improve our  emotional outlook.  Additional Videos:  Exercise    Improving Performance  Clinical staff conducted group or individual video education with verbal and written material and guidebook.  Patient learns to use a non-linear approach by alternating intensity levels and lengths of time spent exercising to help burn more calories and lose more body fat. Cardiovascular exercise helps improve heart health, metabolism, hormonal balance, blood sugar control, and recovery from fatigue. Resistance training improves strength, endurance, balance, coordination, reaction time, metabolism, and muscle mass. Flexibility exercise improves circulation, posture, and balance. Seek guidance from your physician and exercise physiologist before implementing an exercise routine and learn your capabilities and proper form for all exercise.  Introduction to Yoga  Clinical staff conducted group or individual video education with verbal and written material and guidebook.  Patient learns about yoga, a discipline of the coming together of mind, breath, and body. The benefits of yoga include improved flexibility, improved range of motion, better posture and core strength, increased lung function, weight loss, and positive self-image. Yoga's heart health benefits include lowered blood pressure, healthier heart rate, decreased cholesterol and triglyceride levels, improved immune function, and reduced stress. Seek guidance from your physician and exercise physiologist before implementing an exercise routine and learn your capabilities and proper form for all exercise.  Medical   Aging: Enhancing Your Quality of Life  Clinical staff conducted group or individual video education with verbal and written material and guidebook.  Patient learns key strategies and recommendations to stay in good physical health and enhance quality of life, such as prevention strategies, having an advocate, securing a [place],  and keeping a list of medications and system for tracking them. It also discusses how to avoid risk for bone loss.  Biology of Weight Control  Clinical staff conducted group or individual video education with verbal and written material and guidebook.  Patient learns that weight gain occurs because we consume more calories than we burn (eating more, moving less). Even if your body weight is normal, you may have higher ratios of fat compared to muscle mass. Too much body fat puts you at increased risk for cardiovascular disease, heart attack, stroke, type 2 diabetes, and obesity-related cancers. In addition to exercise, following the [place] can help reduce your risk.  Decoding Lab Results  Clinical staff conducted group or individual video education with verbal and written material and guidebook.  Patient learns that lab test reflects one measurement whose values change over time and are influenced by many factors, including medication, stress, sleep, exercise, food, hydration, pre-existing medical conditions, and more. It is recommended to use the knowledge from this video  to become more involved with your lab results and evaluate your numbers to speak with your doctor.   Diseases of Our Time - Overview  Clinical staff conducted group or individual video education with verbal and written material and guidebook.  Patient learns that according to the CDC, 50% to 70% of chronic diseases (such as obesity, type 2 diabetes, elevated lipids, hypertension, and heart disease) are avoidable through lifestyle improvements including healthier food choices, listening to satiety cues, and increased physical activity.  Sleep Disorders Clinical staff conducted group or individual video education with verbal and written material and guidebook.  Patient learns how good quality and duration of sleep are important to overall health and well-being. Patient also learns about sleep disorders and how they  impact health along with recommendations to address them, including discussing with a physician.  Nutrition  Dining Out - Part 2 Clinical staff conducted group or individual video education with verbal and written material and guidebook.  Patient learns how to plan ahead and communicate in order to maximize their dining experience in a healthy and nutritious manner. Included are recommended food choices based on the type of restaurant the patient is visiting.   Fueling a Best boy conducted group or individual video education with verbal and written material and guidebook.  There is a strong connection between our food choices and our health. Diseases like obesity and type 2 diabetes are very prevalent and are in large-part due to lifestyle choices. The Pritikin Eating Plan provides plenty of food and hunger-curbing satisfaction. It is easy to follow, affordable, and helps reduce health risks.  Menu Workshop  Clinical staff conducted group or individual video education with verbal and written material and guidebook.  Patient learns that restaurant meals can sabotage health goals because they are often packed with calories, fat, sodium, and sugar. Recommendations include strategies to plan ahead and to communicate with the manager, chef, or server to help order a healthier meal.  Planning Your Eating Strategy  Clinical staff conducted group or individual video education with verbal and written material and guidebook.  Patient learns about the [place] and its benefit of reducing the risk of disease. The [place] does not focus on calories. Instead, it emphasizes high-quality, nutrient-rich foods. By knowing the characteristics of the foods, we choose, we can determine their calorie density and make informed decisions.  Targeting Your Nutrition Priorities  Clinical staff conducted group or individual video education with verbal and written material and  guidebook.  Patient learns that lifestyle habits have a tremendous impact on disease risk and progression. This video provides eating and physical activity recommendations based on your personal health goals, such as reducing LDL cholesterol, losing weight, preventing or controlling type 2 diabetes, and reducing high blood pressure.  Vitamins and Minerals  Clinical staff conducted group or individual video education with verbal and written material and guidebook.  Patient learns different ways to obtain key vitamins and minerals, including through a recommended healthy diet. It is important to discuss all supplements you take with your doctor.   Healthy Mind-Set    Smoking Cessation  Clinical staff conducted group or individual video education with verbal and written material and guidebook.  Patient learns that cigarette smoking and tobacco addiction pose a serious health risk which affects millions of people. Stopping smoking will significantly reduce the risk of heart disease, lung disease, and many forms of cancer. Recommended strategies for quitting are covered, including working with your doctor  to develop a successful plan.  Culinary   Becoming a Financial trader conducted group or individual video education with verbal and written material and guidebook.  Patient learns that cooking at home can be healthy, cost-effective, quick, and puts them in control. Keys to cooking healthy recipes will include looking at your recipe, assessing your equipment needs, planning ahead, making it simple, choosing cost-effective seasonal ingredients, and limiting the use of added fats, salts, and sugars.  Cooking - Breakfast and Snacks  Clinical staff conducted group or individual video education with verbal and written material and guidebook.  Patient learns how important breakfast is to satiety and nutrition through the entire day. Recommendations include key foods to eat during breakfast to  help stabilize blood sugar levels and to prevent overeating at meals later in the day. Planning ahead is also a key component.  Cooking - Human resources officer conducted group or individual video education with verbal and written material and guidebook.  Patient learns eating strategies to improve overall health, including an approach to cook more at home. Recommendations include thinking of animal protein as a side on your plate rather than center stage and focusing instead on lower calorie dense options like vegetables, fruits, whole grains, and plant-based proteins, such as beans. Making sauces in large quantities to freeze for later and leaving the skin on your vegetables are also recommended to maximize your experience.  Cooking - Healthy Salads and Dressing Clinical staff conducted group or individual video education with verbal and written material and guidebook.  Patient learns that vegetables, fruits, whole grains, and legumes are the foundations of the [place]. Recommendations include how to incorporate each of these in flavorful and healthy salads, and how to create homemade salad dressings. Proper handling of ingredients is also covered. Cooking - Soups and [employer] - Soups and Desserts Clinical staff conducted group or individual video education with verbal and written material and guidebook.  Patient learns that Pritikin soups and desserts make for easy, nutritious, and delicious snacks and meal components that are low in sodium, fat, sugar, and calorie density, while high in vitamins, minerals, and filling fiber. Recommendations include simple and healthy ideas for soups and desserts.   Overview     The Pritikin Solution Program Overview Clinical staff conducted group or individual video education with verbal and written material and guidebook.  Patient learns that the results of the [place] Program have been documented in more than 100 articles  published in peer-reviewed journals, and the benefits include reducing risk factors for (and, in some cases, even reversing) high cholesterol, high blood pressure, type 2 diabetes, obesity, and more! An overview of the three key pillars of the Pritikin Program will be covered: eating well, doing regular exercise, and having a healthy mind-set.  WORKSHOPS  Exercise: Exercise Basics: Building Your Action Plan Clinical staff led group instruction and group discussion with PowerPoint presentation and patient guidebook. To enhance the learning environment the use of posters, models and videos may be added. At the conclusion of this workshop, patients will comprehend the difference between physical activity and exercise, as well as the benefits of incorporating both, into their routine. Patients will understand the FITT (Frequency, Intensity, Time, and Type) principle and how to use it to build an exercise action plan. In addition, safety concerns and other considerations for exercise and cardiac rehab will be addressed by the presenter. The purpose of this lesson is to promote a comprehensive  and effective weekly exercise routine in order to improve patients' overall level of fitness.   Managing Heart Disease: Your Path to a Healthier Heart Clinical staff led group instruction and group discussion with PowerPoint presentation and patient guidebook. To enhance the learning environment the use of posters, models and videos may be added.At the conclusion of this workshop, patients will understand the anatomy and physiology of the heart. Additionally, they will understand how Pritikin's three pillars impact the risk factors, the progression, and the management of heart disease.  The purpose of this lesson is to provide a high-level overview of the heart, heart disease, and how the Pritikin lifestyle positively impacts risk factors.  Exercise Biomechanics Clinical staff led group instruction and group  discussion with PowerPoint presentation and patient guidebook. To enhance the learning environment the use of posters, models and videos may be added. Patients will learn how the structural parts of their bodies function and how these functions impact their daily activities, movement, and exercise. Patients will learn how to promote a neutral spine, learn how to manage pain, and identify ways to improve their physical movement in order to promote healthy living. The purpose of this lesson is to expose patients to common physical limitations that impact physical activity. Participants will learn practical ways to adapt and manage aches and pains, and to minimize their effect on regular exercise. Patients will learn how to maintain good posture while sitting, walking, and lifting.  Balance Training and Fall Prevention  Clinical staff led group instruction and group discussion with PowerPoint presentation and patient guidebook. To enhance the learning environment the use of posters, models and videos may be added. At the conclusion of this workshop, patients will understand the importance of their sensorimotor skills (vision, proprioception, and the vestibular system) in maintaining their ability to balance as they age. Patients will apply a variety of balancing exercises that are appropriate for their current level of function. Patients will understand the common causes for poor balance, possible solutions to these problems, and ways to modify their physical environment in order to minimize their fall risk. The purpose of this lesson is to teach patients about the importance of maintaining balance as they age and ways to minimize their risk of falling.  WORKSHOPS   Nutrition:  Fueling a Scientist, research (physical sciences) led group instruction and group discussion with PowerPoint presentation and patient guidebook. To enhance the learning environment the use of posters, models and videos may be added.  Patients will review the foundational principles of the [place] and understand what constitutes a serving size in each of the food groups. Patients will also learn Pritikin-friendly foods that are better choices when away from home and review make-ahead meal and snack options. Calorie density will be reviewed and applied to three nutrition priorities: weight maintenance, weight loss, and weight gain. The purpose of this lesson is to reinforce (in a group setting) the key concepts around what patients are recommended to eat and how to apply these guidelines when away from home by planning and selecting Pritikin-friendly options. Patients will understand how calorie density may be adjusted for different weight management goals.  Mindful Eating  Clinical staff led group instruction and group discussion with PowerPoint presentation and patient guidebook. To enhance the learning environment the use of posters, models and videos may be added. Patients will briefly review the concepts of the [place] and the importance of low-calorie dense foods. The concept of mindful eating will be introduced as  well as the importance of paying attention to internal hunger signals. Triggers for non-hunger eating and techniques for dealing with triggers will be explored. The purpose of this lesson is to provide patients with the opportunity to review the basic principles of the [place], discuss the value of eating mindfully and how to measure internal cues of hunger and fullness using the Hunger Scale. Patients will also discuss reasons for non-hunger eating and learn strategies to use for controlling emotional eating.  Targeting Your Nutrition Priorities Clinical staff led group instruction and group discussion with PowerPoint presentation and patient guidebook. To enhance the learning environment the use of posters, models and videos may be added. Patients will learn how to determine their  genetic susceptibility to disease by reviewing their family history. Patients will gain insight into the importance of diet as part of an overall healthy lifestyle in mitigating the impact of genetics and other environmental insults. The purpose of this lesson is to provide patients with the opportunity to assess their personal nutrition priorities by looking at their family history, their own health history and current risk factors. Patients will also be able to discuss ways of prioritizing and modifying the [place] for their highest risk areas  Menu  Clinical staff led group instruction and group discussion with PowerPoint presentation and patient guidebook. To enhance the learning environment the use of posters, models and videos may be added. Using menus brought in from [employer], or printed from [employer], patients will apply the [place] dining out guidelines that were presented in the [employer] video. Patients will also be able to practice these guidelines in a variety of provided scenarios. The purpose of this lesson is to provide patients with the opportunity to practice hands-on learning of the [place] with actual menus and practice scenarios.  Label Reading Clinical staff led group instruction and group discussion with PowerPoint presentation and patient guidebook. To enhance the learning environment the use of posters, models and videos may be added. Patients will review and discuss the Pritikin label reading guidelines presented in Pritikin's Label Reading Educational series video. Using fool labels brought in from local grocery stores and markets, patients will apply the label reading guidelines and determine if the packaged food meet the Pritikin guidelines. The purpose of this lesson is to provide patients with the opportunity to review, discuss, and practice hands-on learning of the Pritikin Label Reading guidelines with  actual packaged food labels. [place] Workshops are designed to teach patients ways to prepare quick, simple, and affordable recipes at home. The importance of nutrition's role in chronic disease risk reduction is reflected in its emphasis in the overall Pritikin program. By learning how to prepare essential core Pritikin Eating Plan recipes, patients will increase control over what they eat; be able to customize the flavor of foods without the use of added salt, sugar, or fat; and improve the quality of the food they consume. By learning a set of core recipes which are easily assembled, quickly prepared, and affordable, patients are more likely to prepare more healthy foods at home. These workshops focus on convenient breakfasts, simple entres, side dishes, and desserts which can be prepared with minimal effort and are consistent with nutrition recommendations for cardiovascular risk reduction. Cooking [employer] are taught by a Engineer, materials (RD) who has been trained by the [employer]. The chef or RD has a clear understanding of  the importance of minimizing - if not completely eliminating - added fat, sugar, and sodium in recipes. Throughout the series of [place] Workshop sessions, patients will learn about healthy ingredients and efficient methods of cooking to build confidence in their capability to prepare    Cooking School weekly topics:  Adding Flavor- Sodium-Free  Fast and Healthy Breakfasts  Powerhouse Plant-Based Proteins  Satisfying Salads and Dressings  Simple Sides and Sauces  International Cuisine-Spotlight on the [place] Zones  Delicious Desserts  Savory Soups  Efficiency Cooking - Meals in a Snap  Tasty Appetizers and Snacks  Comforting Weekend Breakfasts  One-Pot Wonders   Fast Evening Meals  Easy [place] (Psychosocial): New  Thoughts, New Behaviors Clinical staff led group instruction and group discussion with PowerPoint presentation and patient guidebook. To enhance the learning environment the use of posters, models and videos may be added. Patients will learn and practice techniques for developing effective health and lifestyle goals. Patients will be able to effectively apply the goal setting process learned to develop at least one new personal goal.  The purpose of this lesson is to expose patients to a new skill set of behavior modification techniques such as techniques setting SMART goals, overcoming barriers, and achieving new thoughts and new behaviors.  Managing Moods and Relationships Clinical staff led group instruction and group discussion with PowerPoint presentation and patient guidebook. To enhance the learning environment the use of posters, models and videos may be added. Patients will learn how emotional and chronic stress factors can impact their health and relationships. They will learn healthy ways to manage their moods and utilize positive coping mechanisms. In addition, ICR patients will learn ways to improve communication skills. The purpose of this lesson is to expose patients to ways of understanding how one's mood and health are intimately connected. Developing a healthy outlook can help build positive relationships and connections with others. Patients will understand the importance of utilizing effective communication skills that include actively listening and being heard. They will learn and understand the importance of the "4 Cs" and especially Connections in fostering of a Healthy Mind-Set.  Healthy Sleep for a Healthy Heart Clinical staff led group instruction and group discussion with PowerPoint presentation and patient guidebook. To enhance the learning environment the use of posters, models and videos may be added. At the conclusion of this workshop, patients will be able to demonstrate  knowledge of the importance of sleep to overall health, well-being, and quality of life. They will understand the symptoms of, and treatments for, common sleep disorders. Patients will also be able to identify daytime and nighttime behaviors which impact sleep, and they will be able to apply these tools to help manage sleep-related challenges. The purpose of this lesson is to provide patients with a general overview of sleep and outline the importance of quality sleep. Patients will learn about a few of the most common sleep disorders. Patients will also be introduced to the concept of "sleep hygiene," and discover ways to self-manage certain sleeping problems through simple daily behavior changes. Finally, the workshop will motivate patients by clarifying the links between quality sleep and their goals of heart-healthy living.   Recognizing and Reducing Stress Clinical staff led group instruction and group discussion with PowerPoint presentation and patient guidebook. To enhance the learning environment the use of posters, models and videos may be added. At the conclusion of this workshop, patients will be able to understand  the types of stress reactions, differentiate between acute and chronic stress, and recognize the impact that chronic stress has on their health. They will also be able to apply different coping mechanisms, such as reframing negative self-talk. Patients will have the opportunity to practice a variety of stress management techniques, such as deep abdominal breathing, progressive muscle relaxation, and/or guided imagery.  The purpose of this lesson is to educate patients on the role of stress in their lives and to provide healthy techniques for coping with it.  Learning Barriers/Preferences:  Learning Barriers/Preferences - 11/02/21 1140       Learning Barriers/Preferences   Learning Barriers Sight   Pt wears Glasses   Learning Preferences Individual Instruction;Group  Instruction;Skilled Demonstration;Written Material;Pictoral             Education Topics:  Knowledge Questionnaire Score:  Knowledge Questionnaire Score - 11/02/21 1100       Knowledge Questionnaire Score   Pre Score 19/24             Core Components/Risk Factors/Patient Goals at Admission:  Personal Goals and Risk Factors at Admission - 11/02/21 1101       Core Components/Risk Factors/Patient Goals on Admission    Weight Management Yes;Weight Maintenance    Intervention Weight Management: Develop a combined nutrition and exercise program designed to reach desired caloric intake, while maintaining appropriate intake of nutrient and fiber, sodium and fats, and appropriate energy expenditure required for the weight goal.;Weight Management: Provide education and appropriate resources to help participant work on and attain dietary goals.    Admit Weight 214 lb (97.1 kg)    Expected Outcomes Short Term: Continue to assess and modify interventions until short term weight is achieved;Long Term: Adherence to nutrition and physical activity/exercise program aimed toward attainment of established weight goal;Weight Maintenance: Understanding of the daily nutrition guidelines, which includes 25-35% calories from fat, 7% or less cal from saturated fats, less than 220m cholesterol, less than 1.5gm of sodium, & 5 or more servings of fruits and vegetables daily;Understanding recommendations for meals to include 15-35% energy as protein, 25-35% energy from fat, 35-60% energy from carbohydrates, less than 2028mof dietary cholesterol, 20-35 gm of total fiber daily;Understanding of distribution of calorie intake throughout the day with the consumption of 4-5 meals/snacks    Hypertension Yes    Intervention Provide education on lifestyle modifcations including regular physical activity/exercise, weight management, moderate sodium restriction and increased consumption of fresh fruit, vegetables, and  low fat dairy, alcohol moderation, and smoking cessation.;Monitor prescription use compliance.    Expected Outcomes Short Term: Continued assessment and intervention until BP is < 140/9049mG in hypertensive participants. < 130/55m64m in hypertensive participants with diabetes, heart failure or chronic kidney disease.;Long Term: Maintenance of blood pressure at goal levels.    Lipids Yes    Intervention Provide education and support for participant on nutrition & aerobic/resistive exercise along with prescribed medications to achieve LDL <70mg5mL >40mg.100mExpected Outcomes Short Term: Participant states understanding of desired cholesterol values and is compliant with medications prescribed. Participant is following exercise prescription and nutrition guidelines.;Long Term: Cholesterol controlled with medications as prescribed, with individualized exercise RX and with personalized nutrition plan. Value goals: LDL < 70mg, 42m> 40 mg.    Personal Goal Other Yes    Personal Goal Short term: Upper body strength Long term: Maintain Wt loss and flexibily    Intervention Will continue to monitor pt and progress workloads as tolerated without sign or  symptom    Expected Outcomes Pt will achieve his goals             Core Components/Risk Factors/Patient Goals Review:    Core Components/Risk Factors/Patient Goals at Discharge (Final Review):    ITP Comments:  ITP Comments     Row Name 11/02/21 1043           ITP Comments Dr Fransico Him MD, Medical Director, Introduction to [facility]  Education Reviewed. Initial Resource Packet Given to the Patient                Comments: Pt tolerated I[facility] cardiac rehab well for his first day. Pt off to a good start.

## 2021-11-09 ENCOUNTER — Telehealth: Payer: Self-pay

## 2021-11-09 DIAGNOSIS — H2512 Age-related nuclear cataract, left eye: Secondary | ICD-10-CM | POA: Diagnosis not present

## 2021-11-09 DIAGNOSIS — Z01818 Encounter for other preprocedural examination: Secondary | ICD-10-CM | POA: Diagnosis not present

## 2021-11-09 DIAGNOSIS — H2511 Age-related nuclear cataract, right eye: Secondary | ICD-10-CM | POA: Diagnosis not present

## 2021-11-09 NOTE — Telephone Encounter (Signed)
   Patient Name: Timothy Munoz  DOB: 1945/08/19 MRN: [id number]  Primary Cardiologist: [name], MD  Chart reviewed as part of pre-operative protocol coverage. Cataract extractions are recognized in guidelines as low risk surgeries that do not typically require specific preoperative testing or holding of blood thinner therapy. Therefore, given past medical history and time since last visit, based on ACC/AHA guidelines, Timothy Munoz would be at acceptable risk for the planned procedure without further cardiovascular testing.   I will route this recommendation to the requesting party via Epic fax function and remove from pre-op pool.  Please call with questions.  Sharlene Dory, PA-C 11/09/2021, 4:02 PM

## 2021-11-09 NOTE — Telephone Encounter (Signed)
   Pre-operative Risk Assessment    Patient Name: Timothy Munoz  DOB: 1946/03/04 MRN: [id number]      Request for Surgical Clearance    Procedure:   Cataract Extraction By PE, IOL-Left then Right  Date of Surgery:  Clearance 11/27/21                                 Surgeon:  Dr. Georges Mouse Surgeon's Group or Practice Name:  [facility] Phone number:  [phone] Ext [phone] Fax number:  [phone]   Type of Clearance Requested:   - Medical    Type of Anesthesia:   IV Sedation   Additional requests/questions:    Signed, Brunetta Genera   11/09/2021, 2:37 PM

## 2021-11-10 ENCOUNTER — Encounter (HOSPITAL_COMMUNITY)
Admission: RE | Admit: 2021-11-10 | Discharge: 2021-11-10 | Disposition: A | Discharge status: Home / Self Care | Payer: Medicare HMO | Source: Ambulatory Visit | Attending: Cardiovascular Disease | Admitting: Cardiovascular Disease

## 2021-11-10 DIAGNOSIS — I2102 ST elevation (STEMI) myocardial infarction involving left anterior descending coronary artery: Secondary | ICD-10-CM

## 2021-11-10 DIAGNOSIS — Z951 Presence of aortocoronary bypass graft: Secondary | ICD-10-CM | POA: Diagnosis not present

## 2021-11-13 ENCOUNTER — Encounter (HOSPITAL_COMMUNITY)
Admission: RE | Admit: 2021-11-13 | Discharge: 2021-11-13 | Disposition: A | Discharge status: Home / Self Care | Payer: Medicare HMO | Source: Ambulatory Visit | Attending: Cardiovascular Disease | Admitting: Cardiovascular Disease

## 2021-11-13 DIAGNOSIS — Z951 Presence of aortocoronary bypass graft: Secondary | ICD-10-CM | POA: Diagnosis not present

## 2021-11-13 DIAGNOSIS — I2102 ST elevation (STEMI) myocardial infarction involving left anterior descending coronary artery: Secondary | ICD-10-CM | POA: Diagnosis not present

## 2021-11-13 DIAGNOSIS — Z48812 Encounter for surgical aftercare following surgery on the circulatory system: Secondary | ICD-10-CM | POA: Diagnosis not present

## 2021-11-15 ENCOUNTER — Encounter (HOSPITAL_COMMUNITY)
Admission: RE | Admit: 2021-11-15 | Discharge: 2021-11-15 | Disposition: A | Discharge status: Home / Self Care | Payer: Medicare HMO | Source: Ambulatory Visit | Attending: Cardiovascular Disease | Admitting: Cardiovascular Disease

## 2021-11-15 DIAGNOSIS — I2102 ST elevation (STEMI) myocardial infarction involving left anterior descending coronary artery: Secondary | ICD-10-CM

## 2021-11-15 DIAGNOSIS — Z951 Presence of aortocoronary bypass graft: Secondary | ICD-10-CM

## 2021-11-15 DIAGNOSIS — Z48812 Encounter for surgical aftercare following surgery on the circulatory system: Secondary | ICD-10-CM | POA: Diagnosis not present

## 2021-11-16 DIAGNOSIS — R69 Illness, unspecified: Secondary | ICD-10-CM | POA: Diagnosis not present

## 2021-11-17 ENCOUNTER — Encounter (HOSPITAL_COMMUNITY)
Admission: RE | Admit: 2021-11-17 | Discharge: 2021-11-17 | Disposition: A | Discharge status: Home / Self Care | Payer: Medicare HMO | Source: Ambulatory Visit | Attending: Cardiovascular Disease | Admitting: Cardiovascular Disease

## 2021-11-17 DIAGNOSIS — Z951 Presence of aortocoronary bypass graft: Secondary | ICD-10-CM | POA: Diagnosis not present

## 2021-11-17 DIAGNOSIS — I2102 ST elevation (STEMI) myocardial infarction involving left anterior descending coronary artery: Secondary | ICD-10-CM

## 2021-11-17 DIAGNOSIS — Z48812 Encounter for surgical aftercare following surgery on the circulatory system: Secondary | ICD-10-CM | POA: Diagnosis not present

## 2021-11-20 ENCOUNTER — Encounter (HOSPITAL_COMMUNITY)
Admission: RE | Admit: 2021-11-20 | Discharge: 2021-11-20 | Disposition: A | Discharge status: Home / Self Care | Payer: Medicare HMO | Source: Ambulatory Visit | Attending: Cardiovascular Disease | Admitting: Cardiovascular Disease

## 2021-11-20 DIAGNOSIS — Z951 Presence of aortocoronary bypass graft: Secondary | ICD-10-CM | POA: Diagnosis not present

## 2021-11-20 DIAGNOSIS — I2102 ST elevation (STEMI) myocardial infarction involving left anterior descending coronary artery: Secondary | ICD-10-CM

## 2021-11-20 DIAGNOSIS — Z48812 Encounter for surgical aftercare following surgery on the circulatory system: Secondary | ICD-10-CM | POA: Diagnosis not present

## 2021-11-22 ENCOUNTER — Encounter (HOSPITAL_COMMUNITY)
Admission: RE | Admit: 2021-11-22 | Discharge: 2021-11-22 | Disposition: A | Discharge status: Home / Self Care | Payer: Medicare HMO | Source: Ambulatory Visit | Attending: Cardiovascular Disease | Admitting: Cardiovascular Disease

## 2021-11-22 DIAGNOSIS — Z951 Presence of aortocoronary bypass graft: Secondary | ICD-10-CM

## 2021-11-22 DIAGNOSIS — I2102 ST elevation (STEMI) myocardial infarction involving left anterior descending coronary artery: Secondary | ICD-10-CM

## 2021-11-22 NOTE — Progress Notes (Signed)
Incomplete Session Note  Patient Details  Name: DAO MEMMOTT MRN: [id number] Date of Birth: 10-02-1945 Referring Provider:   Flowsheet Row INTENSIVE CARDIAC REHAB ORIENT from 11/02/2021 in [facility] CARDIAC [facility]  Referring Provider Dr. [name] MD       Raynaldo Opitz did not complete his rehab session.  Gery Pray reported to Cardiac Rehab staff that he had a syncopal episode on yesterday evening.  Gery Pray was outside sitting on a covered porch/carport felt fine - denied feeling hot or sweaty.  Went into the his home and walked downstairs to the basement.  Gery Pray sat down for a few minutes, got up and immediately felt dizzy and passed out hitting the side of his head and neck on the recliner chair. Recovered immediately checked his blood pressure seated 120's.  Gery Pray was by himself and did not report this episode.  Gery Pray reports that he is dizzy most of the time when he goes from a seated position but not as bad as what he had yesterday.  Routine checks his blood pressure at home 110-120's. Feels fine today asymptomatic other than  some minor soreness to the right side of his neck and face from the fall.  Gery Pray admits he doesn't drink water like he "should".  Gery Pray is also concerned about his unintentional weight loss and wonders could this play a role.  Will contact Dr. [name] office for scheduling an appt for Gery Pray to be seen.  Epimenio Sarin a copy of his rehab report and advised him not to walk his 2 miles until seen in follow up.  Note that Gery Pray walks 2 miles a day solo even on the days he comes to Cardiac Rehab.  Will rout this note to Dr. [name]. Alanson Aly, BSN Cardiac and Emergency planning/management officer

## 2021-11-23 ENCOUNTER — Ambulatory Visit: Payer: Medicare HMO | Admitting: Physician Assistant

## 2021-11-23 VITALS — BP 118/60 | HR 61 | Ht 74.0 in | Wt 208.6 lb

## 2021-11-23 DIAGNOSIS — I2510 Atherosclerotic heart disease of native coronary artery without angina pectoris: Secondary | ICD-10-CM | POA: Diagnosis not present

## 2021-11-23 DIAGNOSIS — I255 Ischemic cardiomyopathy: Secondary | ICD-10-CM | POA: Diagnosis not present

## 2021-11-23 DIAGNOSIS — I10 Essential (primary) hypertension: Secondary | ICD-10-CM | POA: Diagnosis not present

## 2021-11-23 DIAGNOSIS — R55 Syncope and collapse: Secondary | ICD-10-CM

## 2021-11-23 DIAGNOSIS — I483 Typical atrial flutter: Secondary | ICD-10-CM

## 2021-11-23 DIAGNOSIS — E785 Hyperlipidemia, unspecified: Secondary | ICD-10-CM

## 2021-11-23 LAB — BASIC METABOLIC PANEL
BUN/Creatinine Ratio: 23 (ref 10–24)
BUN: 27 mg/dL (ref 8–27)
CO2: 23 mmol/L (ref 20–29)
Calcium: 9.3 mg/dL (ref 8.6–10.2)
Chloride: 104 mmol/L (ref 96–106)
Creatinine, Ser: 1.19 mg/dL (ref 0.76–1.27)
Glucose: 91 mg/dL (ref 70–99)
Potassium: 5.5 mmol/L — ABNORMAL HIGH (ref 3.5–5.2)
Sodium: 137 mmol/L (ref 134–144)
eGFR: 64 mL/min/{1.73_m2} (ref >59)

## 2021-11-23 NOTE — Patient Instructions (Signed)
Medication Instructions:  Your physician recommends that you continue on your current medications as directed. Please refer to the Current Medication list given to you today.  *If you need a refill on your cardiac medications before your next appointment, please call your pharmacy*   Lab Work: BMET If you have labs (blood work) drawn today and your tests are completely normal, you will receive your results only by: MyChart Message (if you have MyChart) OR A paper copy in the mail If you have any lab test that is abnormal or we need to change your treatment, we will call you to review the results.   Testing/Procedures: Your physician has requested that you have an echocardiogram. Echocardiography is a painless test that uses sound waves to create images of your heart. It provides your doctor with information about the size and shape of your heart and how well your heart's chambers and valves are working. This procedure takes approximately one hour. There are no restrictions for this procedure.   Please schedule for late august.    Follow-Up: At [facility], you and your health needs are our priority.  As part of our continuing mission to provide you with exceptional heart care, we have created designated Provider Care Teams.  These Care Teams include your primary Cardiologist (physician) and Advanced Practice Providers (APPs -  Physician Assistants and Nurse Practitioners) who all work together to provide you with the care you need, when you need it.  Your next appointment:   6-7 week(s)  The format for your next appointment:   In Person  Provider:   Azalee Course, PA-C

## 2021-11-23 NOTE — Progress Notes (Signed)
Cardiology Office Note:    Date:  11/25/2021   ID:  Timothy Munoz, DOB 1946/03/13, MRN [id number]  PCP:  [name], MD   [facility] Providers Cardiologist:  [name], MD     Referring MD: [name], MD   Chief Complaint  Patient presents with   Follow-up    Seen for Dr. [name]   Loss of Consciousness    History of Present Illness:    Timothy Munoz is a 76 y.o. male with a hx of CAD, HTN, HLD and history of essential tremor.  Patient had anterolateral STEMI in September 2022 and underwent balloon angioplasty of ostial LAD.  Hospital course was complicated by cardiogenic shock with reduced EF of 35%.  He required intra-aortic balloon pump and ultimately underwent CABG x3 on 01/20/2021 by Dr. Dorris Fetch.  Postprocedure, he did develop postop atrial fibrillation requiring amiodarone and was treated with digoxin.  Echocardiogram obtained on 01/25/2021 showed EF has improved to 40 to 45%, grade 2 DD.  He was treated with Lasix for volume overload.  On follow-up in January, Lasix was changed to PRN.  Repeat echocardiogram in January 2023 showed EF 35 to 40%, PASP 53.4 mmHg, moderate to severe MR.  He was started on Farxiga with subsequent dose reduction due to creatinine elevation.  I last saw the patient in April and transitioned his ARB to [facility].  He was admitted in May 2023 due to volume overload and underwent IV diuresis.  Echocardiogram showed further reduction in LVEF at 26% on 09/18/2021.  He was in atrial flutter and underwent TEE guided DCCV.  Spironolactone was added on 5/27.  proBNP at the time was 3278.  Since his last visit with Dr. [name], patient has been seen by Dr. [name] on 10/06/2021, given a single episode of atrial flutter, it was recommended to continue monitoring.  If further episode occurs in the future, Dr. [name] would recommend A-flutter ablation.  I last saw the patient on 11/06/2021 at which time he denied any chest pain or worsening dyspnea.   His renal function was stable on the recent blood work.  His only complaint was he was getting up multiple times in a single night to go to the bathroom to urinate.  He feels like he is not emptying his bladder.  I added Flomax to his medical regimen.   Patient presents today for evaluation of syncope.  He apparently had a episode of syncope 2 days ago on 7/11.  He says he has been feeling very well recently.  The Flomax has definitely helped him to void his bladder better.  He denies any recent chest pain, worsening dyspnea or dizzy spell.  On Tuesday, he went back to his basement, he sat down and upon standing back up, he fell to the ground and had a brief passing out spell.  He says he remember his body going down and hitting the floor.  He does not believe he lost consciousness for long, he says he he quickly got back up.  He has no prodromal symptoms prior to the event such as dizziness, chest discomfort, shortness of breath or flushing sensation.  He has been feeling well since.  Cardiac rehab documented significant weight shift recently.  His weight on 6/22 was 214 pounds, he is weight on 7/10 at the cardiac rehab was 202 pounds.  Today's weight in the office was 208 pounds.  I am not sure how accurate was his weight on 7/10.  He did  mention that he is eating a little bit better in the past 3 days.  I recommend a basic metabolic panel to rule out dehydration.  If there is sign of dehydration, I will likely stop his Farxiga and spironolactone.  Note, he is only taking half a tablet of Farxiga daily instead of a full tablet as documented.  I probably will repeat the blood work in 1 week if there is sign of dehydration to make sure renal function improve before releasing him back to cardiac rehab.  However if there is no sign of dehydration on the blood work, I likely will consider heart monitor to rule out arrhythmia.  Past Medical History:  Diagnosis Date   Coronary artery disease    Hyperlipidemia     mild    Hypertension    Knee pain    Obesity    Tremor    Tremor, essential 03/04/2019    Past Surgical History:  Procedure Laterality Date   APPENDECTOMY     BACK SURGERY     CARDIAC CATHETERIZATION     CARDIOVERSION N/A 09/20/2021   Procedure: CARDIOVERSION;  Surgeon: Jerline Pain, MD;  Location: [place] [facility];  Service: Cardiovascular;  Laterality: N/A;   CORONARY ARTERY BYPASS GRAFT N/A 01/20/2021   Procedure: CORONARY ARTERY BYPASS GRAFTING (CABG) X 3 ON PUMP, USING LEFT INTERNAL MAMMARY ARTERY AND LEFT ENDOSCOPIC GREATER SAPHENOUS VEIN HARVEST CONDUITS;  Surgeon: Melrose Nakayama, MD;  Location: [place];  Service: Open Heart Surgery;  Laterality: N/A;   CORONARY/GRAFT ACUTE MI REVASCULARIZATION N/A 01/16/2021   Procedure: Coronary/Graft Acute MI Revascularization;  Surgeon: Troy Sine, MD;  Location: [place] [facility];  Service: Cardiovascular;  Laterality: N/A;   ENDOVEIN HARVEST OF GREATER SAPHENOUS VEIN Left 01/20/2021   Procedure: ENDOVEIN HARVEST OF GREATER SAPHENOUS VEIN;  Surgeon: Melrose Nakayama, MD;  Location: [place];  Service: Open Heart Surgery;  Laterality: Left;   HEMORRHOID SURGERY     IABP INSERTION N/A 01/16/2021   Procedure: IABP Insertion;  Surgeon: Troy Sine, MD;  Location: [place] [facility];  Service: Cardiovascular;  Laterality: N/A;   LEFT HEART CATH AND CORONARY ANGIOGRAPHY N/A 01/16/2021   Procedure: LEFT HEART CATH AND CORONARY ANGIOGRAPHY;  Surgeon: Troy Sine, MD;  Location: [place] [facility];  Service: Cardiovascular;  Laterality: N/A;   TEE WITHOUT CARDIOVERSION N/A 01/20/2021   Procedure: TRANSESOPHAGEAL ECHOCARDIOGRAM (TEE);  Surgeon: Melrose Nakayama, MD;  Location: [place];  Service: Open Heart Surgery;  Laterality: N/A;   TEE WITHOUT CARDIOVERSION N/A 09/20/2021   Procedure: TRANSESOPHAGEAL ECHOCARDIOGRAM (TEE);  Surgeon: Jerline Pain, MD;  Location: [facility];  Service: Cardiovascular;  Laterality: N/A;     Current Medications: Current Meds  Medication Sig   apixaban (ELIQUIS) 5 MG TABS tablet Take 1 tablet (5 mg total) by mouth 2 (two) times daily.   carvedilol (COREG) 6.25 MG tablet Take 1 tablet (6.25 mg total) by mouth 2 (two) times daily with a meal.   clopidogrel (PLAVIX) 75 MG tablet Take 1 tablet (75 mg total) by mouth daily.   dapagliflozin propanediol (FARXIGA) 5 MG TABS tablet Take 5 mg by mouth daily.   primidone (MYSOLINE) 50 MG tablet Take 1-2 tablets daily for tremor (Patient taking differently: Take 100 mg by mouth daily.)   rosuvastatin (CRESTOR) 20 MG tablet Take 1 tablet (20 mg total) by mouth daily.   sacubitril-valsartan (ENTRESTO) 24-26 MG Take 1 tablet by mouth 2 (two) times daily.  spironolactone (ALDACTONE) 25 MG tablet Take 0.5 tablets (12.5 mg total) by mouth every other day.   tamsulosin (FLOMAX) 0.4 MG CAPS capsule Take 1 capsule (0.4 mg total) by mouth daily after breakfast.   [DISCONTINUED] dapagliflozin propanediol (FARXIGA) 10 MG TABS tablet Take 1 tablet (10 mg total) by mouth daily before breakfast. (Patient taking differently: Take 5 mg by mouth daily before breakfast.)     Allergies:   Patient has no known allergies.   Social History   Socioeconomic History   Marital status: Single    Spouse name: Not on file   Number of children: 2   Years of education: 13   Highest education level: High school graduate  Occupational History   Occupation: retired    Comment: Pharmacist, hospital  Tobacco Use   Smoking status: Former   Smokeless tobacco: Current    Types: Chew  Substance and Sexual Activity   Alcohol use: Yes    Comment: one beer every two months   Drug use: No   Sexual activity: Not on file  Other Topics Concern   Not on file  Social History Narrative   Right handed    Social Determinants of Health   Financial Resource Strain: Not on file  Food Insecurity: Not on file  Transportation Needs: Not on file  Physical Activity: Not on file   Stress: Not on file  Social Connections: Not on file     Family History: The patient's family history includes Alzheimer's disease in his mother; Hypertension in his father and mother; Parkinson's disease in his mother; Suicidality in his father.  ROS:   Please see the history of present illness.     All other systems reviewed and are negative.  EKGs/Labs/Other Studies Reviewed:    The following studies were reviewed today:  Echo 09/20/2021  1. Left ventricular ejection fraction, by estimation, is 20 to 25%. The  left ventricle has severely decreased function. The left ventricle has no  regional wall motion abnormalities.   2. Right ventricular systolic function is normal. The right ventricular  size is normal.   3. No left atrial/left atrial appendage thrombus was detected.   4. PISA radius 0.4 cm      Annular diameter 3.07 cm      Mal Coaptation 0.26 cm      MR ERO 0.13 cm2      MR Vol 12 ml      Normal pulmonary vein flow. The mitral valve is degenerative. Moderate  mitral valve regurgitation. No evidence of mitral stenosis.   5. The aortic valve is normal in structure. Aortic valve regurgitation is  not visualized. No aortic stenosis is present.   6. There is mild (Grade II) plaque.   7. The inferior vena cava is normal in size with greater than 50%  respiratory variability, suggesting right atrial pressure of 3 mmHg.   EKG:  EKG is ordered today.  The ekg ordered today demonstrates normal sinus rhythm, poor R wave progression in the anterior leads.  Recent Labs: 09/17/2021: B Natriuretic Peptide 824.1; Hemoglobin 11.6; Platelets 303; TSH 5.397 09/18/2021: Magnesium 2.2 09/27/2021: ALT 21; NT-Pro BNP 3,278 11/23/2021: BUN 27; Creatinine, Ser 1.19; Potassium 5.5; Sodium 137  Recent Lipid Panel    Component Value Date/Time   CHOL 163 01/16/2021 0739   TRIG 33 01/16/2021 0739   HDL 42 01/16/2021 0739   CHOLHDL 3.9 01/16/2021 0739   VLDL 7 01/16/2021 0739   LDLCALC 114  (H) 01/16/2021 0739  Risk Assessment/Calculations:           Physical Exam:    VS:  BP 118/60   Pulse 61   Ht 6\' 2"  (1.88 m)   Wt 208 lb 9.6 oz (94.6 kg)   SpO2 96%   BMI 26.78 kg/m     Wt Readings from Last 3 Encounters:  11/23/21 208 lb 9.6 oz (94.6 kg)  11/06/21 212 lb 12.8 oz (96.5 kg)  11/02/21 214 lb 1.1 oz (97.1 kg)     GEN:  Well nourished, well developed in no acute distress HEENT: Normal NECK: No JVD; No carotid bruits LYMPHATICS: No lymphadenopathy CARDIAC: RRR, no murmurs, rubs, gallops RESPIRATORY:  Clear to auscultation without rales, wheezing or rhonchi  ABDOMEN: Soft, non-tender, non-distended MUSCULOSKELETAL:  No edema; No deformity  SKIN: Warm and dry NEUROLOGIC:  Alert and oriented x 3 PSYCHIATRIC:  Normal affect   ASSESSMENT:    1. Syncope, unspecified syncope type   2. Ischemic cardiomyopathy   3. Coronary artery disease involving native coronary artery of native heart without angina pectoris   4. Primary hypertension   5. Hyperlipidemia LDL goal <70   6. Typical atrial flutter (HCC)    PLAN:    In order of problems listed above:  Syncope: Solitary episode of syncope recently on 7/11, orthostatic vital sign is negative.  Obtain basic metabolic panel to make sure he is not dehydrated, if there is no sign of dehydration, will consider heart monitor.  Ischemic cardiomyopathy: Recent echocardiogram showed a drop in ejection fraction down to 26%.  We will repeat limited echocardiogram  CAD s/p CABG: On Plavix and Crestor  Hypertension: Blood pressure stable  Hyperlipidemia: On Crestor  Atrial flutter: Patient has a history of postop atrial fibrillation after bypass surgery, recently, he underwent TEE DCCV for atrial flutter.  He was seen by Dr. Lovena Le who recommended continued watchful waiting given solitary episode.  If he has any recurrence in the future, may consider ablation.           Medication Adjustments/Labs and Tests  Ordered: Current medicines are reviewed at length with the patient today.  Concerns regarding medicines are outlined above.  Orders Placed This Encounter  Procedures   Basic Metabolic Panel (BMET)   EKG 12-Lead   ECHOCARDIOGRAM LIMITED   No orders of the defined types were placed in this encounter.   Patient Instructions  Medication Instructions:  Your physician recommends that you continue on your current medications as directed. Please refer to the Current Medication list given to you today.  *If you need a refill on your cardiac medications before your next appointment, please call your pharmacy*   Lab Work: BMET If you have labs (blood work) drawn today and your tests are completely normal, you will receive your results only by: [place] (if you have MyChart) OR A paper copy in the mail If you have any lab test that is abnormal or we need to change your treatment, we will call you to review the results.   Testing/Procedures: Your physician has requested that you have an echocardiogram. Echocardiography is a painless test that uses sound waves to create images of your heart. It provides your doctor with information about the size and shape of your heart and how well your heart's chambers and valves are working. This procedure takes approximately one hour. There are no restrictions for this procedure.   Please schedule for late august.    Follow-Up: At [facility], you and your health  needs are our priority.  As part of our continuing mission to provide you with exceptional heart care, we have created designated Provider Care Teams.  These Care Teams include your primary Cardiologist (physician) and Advanced Practice Providers (APPs -  Physician Assistants and Nurse Practitioners) who all work together to provide you with the care you need, when you need it.  Your next appointment:   6-7 week(s)  The format for your next appointment:   In Person  Provider:   Almyra Deforest, PA-C        Signed, Almyra Deforest, [place]  11/25/2021 10:46 PM    [place]

## 2021-11-24 ENCOUNTER — Encounter (HOSPITAL_COMMUNITY): Payer: Medicare HMO

## 2021-11-25 ENCOUNTER — Encounter: Payer: Self-pay | Admitting: Physician Assistant

## 2021-11-27 ENCOUNTER — Ambulatory Visit (INDEPENDENT_AMBULATORY_CARE_PROVIDER_SITE_OTHER): Payer: Medicare HMO

## 2021-11-27 ENCOUNTER — Other Ambulatory Visit: Payer: Self-pay

## 2021-11-27 ENCOUNTER — Ambulatory Visit: Payer: Medicare HMO

## 2021-11-27 ENCOUNTER — Encounter (HOSPITAL_COMMUNITY): Payer: Medicare HMO

## 2021-11-27 DIAGNOSIS — H2512 Age-related nuclear cataract, left eye: Secondary | ICD-10-CM | POA: Diagnosis not present

## 2021-11-27 DIAGNOSIS — Z79899 Other long term (current) drug therapy: Secondary | ICD-10-CM

## 2021-11-27 DIAGNOSIS — R55 Syncope and collapse: Secondary | ICD-10-CM

## 2021-11-27 DIAGNOSIS — H269 Unspecified cataract: Secondary | ICD-10-CM | POA: Diagnosis not present

## 2021-11-27 NOTE — Progress Notes (Unsigned)
Enrolled for [employer] to mail a ZIO AT Live Telemetry monitor to patients address on file.   Dr. Kelly to read. 

## 2021-11-29 ENCOUNTER — Encounter (HOSPITAL_COMMUNITY): Payer: Medicare HMO

## 2021-11-29 DIAGNOSIS — R55 Syncope and collapse: Secondary | ICD-10-CM | POA: Diagnosis not present

## 2021-12-01 ENCOUNTER — Encounter (HOSPITAL_COMMUNITY): Payer: Medicare HMO

## 2021-12-04 ENCOUNTER — Encounter (HOSPITAL_COMMUNITY): Payer: Medicare HMO

## 2021-12-04 ENCOUNTER — Other Ambulatory Visit: Payer: Self-pay

## 2021-12-04 DIAGNOSIS — Z79899 Other long term (current) drug therapy: Secondary | ICD-10-CM | POA: Diagnosis not present

## 2021-12-04 LAB — BASIC METABOLIC PANEL
BUN/Creatinine Ratio: 26 — ABNORMAL HIGH (ref 10–24)
BUN: 32 mg/dL — ABNORMAL HIGH (ref 8–27)
CO2: 23 mmol/L (ref 20–29)
Calcium: 8.8 mg/dL (ref 8.6–10.2)
Chloride: 103 mmol/L (ref 96–106)
Creatinine, Ser: 1.24 mg/dL (ref 0.76–1.27)
Glucose: 87 mg/dL (ref 70–99)
Potassium: 5 mmol/L (ref 3.5–5.2)
Sodium: 136 mmol/L (ref 134–144)
eGFR: 61 mL/min/{1.73_m2} (ref >59)

## 2021-12-05 ENCOUNTER — Other Ambulatory Visit (HOSPITAL_COMMUNITY): Payer: Medicare HMO

## 2021-12-06 ENCOUNTER — Encounter (HOSPITAL_COMMUNITY): Payer: Medicare HMO

## 2021-12-06 NOTE — Progress Notes (Signed)
Cardiac Individual Treatment Plan  Patient Details  Name: Timothy Munoz MRN: [id number] Date of Birth: Jan 15, 1946 Referring Provider:   Flowsheet Row INTENSIVE CARDIAC REHAB ORIENT from 11/02/2021 in [place]  Referring Provider Dr. 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  Patient's Home Medications on Admission:  Current Outpatient Medications:    apixaban (ELIQUIS) 5 MG TABS tablet, Take 1 tablet (5 mg total) by mouth 2 (two) times daily., Disp: 180 tablet, Rfl: 3   carvedilol (COREG) 6.25 MG tablet, Take 1 tablet (6.25 mg total) by mouth 2 (two) times daily with a meal., Disp: 180 tablet, Rfl: 1   clopidogrel (PLAVIX) 75 MG tablet, Take 1 tablet (75 mg total) by mouth daily., Disp: 30 tablet, Rfl: 11   dapagliflozin propanediol (FARXIGA) 5 MG TABS tablet, Take 5 mg by mouth daily., Disp: , Rfl:    furosemide (LASIX) 40 MG tablet, Take 1 tablet (40 mg total) by mouth daily as needed for fluid. (Patient not taking: Reported on 11/23/2021), Disp: 90 tablet, Rfl: 3   potassium chloride SA (KLOR-CON M) 20 MEQ tablet, Take 1 tablet (20 mEq total) by mouth daily as needed (take along with furosemide). (Patient not taking: Reported on 11/23/2021), Disp: 90 tablet, Rfl: 3   primidone (MYSOLINE) 50 MG tablet, Take 1-2 tablets daily for tremor (Patient taking differently: Take 100 mg by mouth daily.), Disp: 180 tablet, Rfl: 3   rosuvastatin (CRESTOR) 20 MG tablet, Take 1 tablet (20 mg total) by mouth daily., Disp: 90 tablet, Rfl: 3   sacubitril-valsartan (ENTRESTO) 24-26 MG, Take 1 tablet by mouth 2 (two) times daily., Disp: 60 tablet, Rfl: 3   tamsulosin (FLOMAX) 0.4 MG CAPS capsule, Take 1 capsule (0.4 mg total) by mouth daily after breakfast., Disp: 30 capsule, Rfl:  4  Past Medical History: Past Medical History:  Diagnosis Date   Coronary artery disease    Hyperlipidemia    mild    Hypertension    Knee pain    Obesity    Tremor    Tremor, essential 03/04/2019    Tobacco Use: Social History   Tobacco Use  Smoking Status Former  Smokeless Tobacco Current   Types: Chew    Labs: Review Flowsheet  More data exists      Latest Ref Rng & Units 01/22/2021 01/23/2021 01/24/2021 01/25/2021 01/26/2021  Labs for ITP Cardiac and Pulmonary Rehab  O2 Saturation % 57.3  56.3  C 45.2  51.3  49.6  48.1  61.2  68.7     Details      C Corrected result   Multiple values from one day are sorted in reverse-chronological order         Capillary Blood Glucose: Lab Results  Component Value Date   GLUCAP 113 (H) 01/27/2021   GLUCAP 124 (H) 01/26/2021   GLUCAP 98 01/26/2021   GLUCAP 99 01/26/2021   GLUCAP 130 (H) 01/26/2021     Exercise Target Goals: Exercise Program Goal: Individual exercise prescription set using results from initial 6 min walk test and THRR while considering  patient's activity barriers and safety.   Exercise Prescription Goal: Initial exercise prescription builds to 30-45 minutes a day of aerobic activity, 2-3 days per week.  Home exercise guidelines  will be given to patient during program as part of exercise prescription that the participant will acknowledge.  Activity Barriers & Risk Stratification:  Activity Barriers & Cardiac Risk Stratification - 11/02/21 1051       Activity Barriers & Cardiac Risk Stratification   Activity Barriers Balance Concerns;Deconditioning    Cardiac Risk Stratification High   Under 5 MET's            6 Minute Walk:  6 Minute Walk     Row Name 11/02/21 1048         6 Minute Walk   Phase Initial     Distance 1600 feet     Walk Time 6 minutes     # of Rest Breaks 0     MPH 3.03     METS 3.2     RPE 11     Perceived Dyspnea  0     VO2 Peak 11.21     Symptoms No     Resting  HR 58 bpm     Resting BP 104/54     Resting Oxygen Saturation  99 %     Exercise Oxygen Saturation  during 6 min walk 96 %     Max Ex. HR 105 bpm     Max Ex. BP 110/68     2 Minute Post BP 124/62              Oxygen Initial Assessment:   Oxygen Re-Evaluation:   Oxygen Discharge (Final Oxygen Re-Evaluation):   Initial Exercise Prescription:  Initial Exercise Prescription - 11/02/21 1000       Date of Initial Exercise RX and Referring Provider   Date 11/02/21    Referring Provider Dr. Shelva Majestic MD    Expected Discharge Date 12/29/21      NuStep   Level 2    SPM 80    Minutes 15    METs 2      Arm Ergometer   Level 1.5    RPM 85    Minutes 15    METs 1.7      Prescription Details   Frequency (times per week) 3    Duration Progress to 30 minutes of continuous aerobic without signs/symptoms of physical distress      Intensity   THRR 40-80% of Max Heartrate 58-116    Ratings of Perceived Exertion 11-13    Perceived Dyspnea 0-4      Progression   Progression Continue progressive overload as per policy without signs/symptoms or physical distress.      Resistance Training   Training Prescription Yes    Weight 4    Reps 10-15             Perform Capillary Blood Glucose checks as needed.  Exercise Prescription Changes:   Exercise Prescription Changes     Row Name 11/08/21 0825             Response to Exercise   Blood Pressure (Admit) 120/64       Blood Pressure (Exercise) 134/82       Blood Pressure (Exit) 102/60       Heart Rate (Admit) 70 bpm       Heart Rate (Exercise) 105 bpm       Heart Rate (Exit) 70 bpm       Rating of Perceived Exertion (Exercise) 11       Perceived Dyspnea (Exercise) 0       Symptoms 0  Comments Pt first day in the CRP2 program       Duration Progress to 30 minutes of  aerobic without signs/symptoms of physical distress       Intensity THRR unchanged         Progression   Progression Continue to  progress workloads to maintain intensity without signs/symptoms of physical distress.       Average METs 2.35         Resistance Training   Training Prescription No         NuStep   Level 2       SPM 80       Minutes 15       METs 2.6         Arm Ergometer   Level 1.5       RPM 85       Minutes 15       METs 2                Exercise Comments:   Exercise Comments     Row Name 11/08/21 0831           Exercise Comments Pt first day in the CRP2 program. Pt tolerated exercise well with an average MET level of 2.35. Pt is learning his THRR, RPE, and ExRx.                Exercise Goals and Review:   Exercise Goals     Row Name 11/02/21 1057             Exercise Goals   Increase Physical Activity Yes       Intervention Provide advice, education, support and counseling about physical activity/exercise needs.;Develop an individualized exercise prescription for aerobic and resistive training based on initial evaluation findings, risk stratification, comorbidities and participant's personal goals.       Expected Outcomes Short Term: Attend rehab on a regular basis to increase amount of physical activity.;Long Term: Add in home exercise to make exercise part of routine and to increase amount of physical activity.;Long Term: Exercising regularly at least 3-5 days a week.       Increase Strength and Stamina Yes       Intervention Provide advice, education, support and counseling about physical activity/exercise needs.;Develop an individualized exercise prescription for aerobic and resistive training based on initial evaluation findings, risk stratification, comorbidities and participant's personal goals.       Expected Outcomes Short Term: Increase workloads from initial exercise prescription for resistance, speed, and METs.;Short Term: Perform resistance training exercises routinely during rehab and add in resistance training at home;Long Term: Improve cardiorespiratory  fitness, muscular endurance and strength as measured by increased METs and functional capacity (6MWT)       Able to understand and use rate of perceived exertion (RPE) scale Yes       Intervention Provide education and explanation on how to use RPE scale       Expected Outcomes Short Term: Able to use RPE daily in rehab to express subjective intensity level;Long Term:  Able to use RPE to guide intensity level when exercising independently       Knowledge and understanding of Target Heart Rate Range (THRR) Yes       Intervention Provide education and explanation of THRR including how the numbers were predicted and where they are located for reference       Expected Outcomes Short Term: Able to state/look up THRR;Long Term: Able to use THRR  to govern intensity when exercising independently;Short Term: Able to use daily as guideline for intensity in rehab       Understanding of Exercise Prescription Yes       Intervention Provide education, explanation, and written materials on patient's individual exercise prescription       Expected Outcomes Short Term: Able to explain program exercise prescription;Long Term: Able to explain home exercise prescription to exercise independently                Exercise Goals Re-Evaluation :  Exercise Goals Re-Evaluation     Row Name 11/08/21 0829             Exercise Goal Re-Evaluation   Exercise Goals Review Increase Physical Activity;Increase Strength and Stamina;Able to understand and use rate of perceived exertion (RPE) scale;Knowledge and understanding of Target Heart Rate Range (THRR);Understanding of Exercise Prescription       Comments Pt first day in the CRP2 program. Pt tolerated exercise well with an average MET level of 2.35. Pt is learning his THRR, RPE, and ExRx.       Expected Outcomes Will continue to monitor pt and progress workloads as tolerated without sign or symptom                Discharge Exercise Prescription (Final Exercise  Prescription Changes):  Exercise Prescription Changes - 11/08/21 0825       Response to Exercise   Blood Pressure (Admit) 120/64    Blood Pressure (Exercise) 134/82    Blood Pressure (Exit) 102/60    Heart Rate (Admit) 70 bpm    Heart Rate (Exercise) 105 bpm    Heart Rate (Exit) 70 bpm    Rating of Perceived Exertion (Exercise) 11    Perceived Dyspnea (Exercise) 0    Symptoms 0    Comments Pt first day in the CRP2 program    Duration Progress to 30 minutes of  aerobic without signs/symptoms of physical distress    Intensity THRR unchanged      Progression   Progression Continue to progress workloads to maintain intensity without signs/symptoms of physical distress.    Average METs 2.35      Resistance Training   Training Prescription No      NuStep   Level 2    SPM 80    Minutes 15    METs 2.6      Arm Ergometer   Level 1.5    RPM 85    Minutes 15    METs 2             Nutrition:  Target Goals: Understanding of nutrition guidelines, daily intake of sodium '1500mg'$ , cholesterol '200mg'$ , calories 30% from fat and 7% or less from saturated fats, daily to have 5 or more servings of fruits and vegetables.  Biometrics:  Pre Biometrics - 11/02/21 1058       Pre Biometrics   Height 6' 1.25" (1.861 m)    Weight 214 lb 1.1 oz (97.1 kg)    Waist Circumference 41 inches    Hip Circumference 43.75 inches    Waist to Hip Ratio 0.94 %    BMI (Calculated) 28.04    Triceps Skinfold 11 mm    % Body Fat 26.6 %    Grip Strength 37 kg    Flexibility --   Pt unable to reach   Single Leg Stand 7.7 seconds              Nutrition Therapy Plan and  Nutrition Goals:  Nutrition Therapy & Goals - 11/29/21 0945       Nutrition Therapy   Diet Heart healthy diet    Drug/Food Interactions Statins/Certain Fruits      Personal Nutrition Goals   Nutrition Goal Patient to choose a daily variety of fruits, vegetables, whole grains, lean protein/plant protein, nonfat dairy as  part of a heart healthy lifestyle    Personal Goal #2 Patient to limit to 1500mg  of sodium daily.    Personal Goal #3 Patient to identify and limit daily intake of sodium, trans fats, saturated fat, and refined carbohydrates    Comments Patient is down ~60# since 08/29/2020. He has missed multiple classes due to a syncope episode and continues follow-up with cardiology. They did rule out dehydration and stopped spirolactone due to elevated Potassium (5.5). Patient declines Pritikin education classes to aid with nutrition support.      Intervention Plan   Intervention Prescribe, educate and counsel regarding individualized specific dietary modifications aiming towards targeted core components such as weight, hypertension, lipid management, diabetes, heart failure and other comorbidities.    Expected Outcomes Short Term Goal: Understand basic principles of dietary content, such as calories, fat, sodium, cholesterol and nutrients.;Long Term Goal: Adherence to prescribed nutrition plan.             Nutrition Assessments:  MEDIFICTS Score Key: ?70 Need to make dietary changes  40-70 Heart Healthy Diet ? 40 Therapeutic Level Cholesterol Diet    Picture Your Plate Scores: <35 Unhealthy dietary pattern with much room for improvement. 41-50 Dietary pattern unlikely to meet recommendations for good health and room for improvement. 51-60 More healthful dietary pattern, with some room for improvement.  >60 Healthy dietary pattern, although there may be some specific behaviors that could be improved.    Nutrition Goals Re-Evaluation:  Nutrition Goals Re-Evaluation     Row Name 11/29/21 0945             Goals   Current Weight 202 lb 2.6 oz (91.7 kg)  weight from last attended class on 7/10       Expected Outcome Patient is down >60# since 08/29/2020 (start weight 268#). He has missed multiple classes due to a syncope episode and continues follow-up with cardiology. They did rule out  dehydration and stopped spirolactone due to elevated Potassium (5.5). Patient declines Pritikin education classes to aid with nutrition support. He reports that he eats out frequently and does very little cooking.                Nutrition Goals Re-Evaluation:  Nutrition Goals Re-Evaluation     Row Name 11/29/21 0945             Goals   Current Weight 202 lb 2.6 oz (91.7 kg)  weight from last attended class on 7/10       Expected Outcome Patient is down >60# since 08/29/2020 (start weight 268#). He has missed multiple classes due to a syncope episode and continues follow-up with cardiology. They did rule out dehydration and stopped spirolactone due to elevated Potassium (5.5). Patient declines Pritikin education classes to aid with nutrition support. He reports that he eats out frequently and does very little cooking.                Nutrition Goals Discharge (Final Nutrition Goals Re-Evaluation):  Nutrition Goals Re-Evaluation - 11/29/21 0945       Goals   Current Weight 202 lb 2.6 oz (91.7 kg)  weight from last attended class on 7/10   Expected Outcome Patient is down >60# since 08/29/2020 (start weight 268#). He has missed multiple classes due to a syncope episode and continues follow-up with cardiology. They did rule out dehydration and stopped spirolactone due to elevated Potassium (5.5). Patient declines Pritikin education classes to aid with nutrition support. He reports that he eats out frequently and does very little cooking.             Psychosocial: Target Goals: Acknowledge presence or absence of significant depression and/or stress, maximize coping skills, provide positive support system. Participant is able to verbalize types and ability to use techniques and skills needed for reducing stress and depression.  Initial Review & Psychosocial Screening:  Initial Psych Review & Screening - 11/02/21 1510       Initial Review   Current issues with None Identified       Family Dynamics   Good Support System? Yes   Alvester Chou lives alone. Alvester Chou has his daughter for support     Barriers   Psychosocial barriers to participate in program There are no identifiable barriers or psychosocial needs.      Screening Interventions   Interventions Encouraged to exercise             Quality of Life Scores:  Quality of Life - 11/02/21 1139       Quality of Life   Select Quality of Life      Quality of Life Scores   Health/Function Pre 23.82 %    Socioeconomic Pre 25.5 %    Psych/Spiritual Pre 23 %    Family Pre 22.8 %    GLOBAL Pre 23.82 %            Scores of 19 and below usually indicate a poorer quality of life in these areas.  A difference of  2-3 points is a clinically meaningful difference.  A difference of 2-3 points in the total score of the Quality of Life Index has been associated with significant improvement in overall quality of life, self-image, physical symptoms, and general health in studies assessing change in quality of life.  PHQ-9: Review Flowsheet       11/02/2021  Depression screen PHQ 2/9  Decreased Interest 0  Down, Depressed, Hopeless 0  PHQ - 2 Score 0   Interpretation of Total Score  Total Score Depression Severity:  1-4 = Minimal depression, 5-9 = Mild depression, 10-14 = Moderate depression, 15-19 = Moderately severe depression, 20-27 = Severe depression   Psychosocial Evaluation and Intervention:  Psychosocial Evaluation - 12/05/21 1615       Psychosocial Evaluation & Interventions   Comments Alvester Chou is currently on hold for cardiac rehab due to syncopal fall in his home on 7/11.  He has complteted follow up with Cardiology APP  awaiting ok to return to cardiac rehab.  will complete more formal assessment of any psychosocial issues.    Expected Outcomes Alvester Chou will display positive outlook and healthy coping skills.  Denies any new psycosocial barriers             Psychosocial  Re-Evaluation:   Psychosocial Discharge (Final Psychosocial Re-Evaluation):   Vocational Rehabilitation: Provide vocational rehab assistance to qualifying candidates.   Vocational Rehab Evaluation & Intervention:  Vocational Rehab - 11/02/21 1512       Initial Vocational Rehab Evaluation & Intervention   Assessment shows need for Vocational Rehabilitation No   Alvester Chou is retired and does not need vocational rehab  at this time            Education: Education Goals: Education classes will be provided on a weekly basis, covering required topics. Participant will state understanding/return demonstration of topics presented.    Education - 11/08/21 0900       Education   Cardiac Education Topics Pritikin    Sales executive    Weekly Topic Satisfying Salads and Dressings    Instruction Review Code 1- Verbalizes Understanding    Class Start Time 0813    Class Stop Time 0900    Class Time Calculation (min) 47 min             Core Videos: Exercise    Move It!  Clinical staff conducted group or individual video education with verbal and written material and guidebook.  Patient learns the recommended Pritikin exercise program. Exercise with the goal of living a long, healthy life. Some of the health benefits of exercise include controlled diabetes, healthier blood pressure levels, improved cholesterol levels, improved heart and lung capacity, improved sleep, and better body composition. Everyone should speak with their doctor before starting or changing an exercise routine.  Biomechanical Limitations Clinical staff conducted group or individual video education with verbal and written material and guidebook.  Patient learns how biomechanical limitations can impact exercise and how we can mitigate and possibly overcome limitations to have an impactful and balanced exercise routine.  Body Composition Clinical staff conducted  group or individual video education with verbal and written material and guidebook.  Patient learns that body composition (ratio of muscle mass to fat mass) is a key component to assessing overall fitness, rather than body weight alone. Increased fat mass, especially visceral belly fat, can put [place] at increased risk for metabolic syndrome, type 2 diabetes, heart disease, and even death. It is recommended to combine diet and exercise (cardiovascular and resistance training) to improve your body composition. Seek guidance from your physician and exercise physiologist before implementing an exercise routine.  Exercise Action Plan Clinical staff conducted group or individual video education with verbal and written material and guidebook.  Patient learns the recommended strategies to achieve and enjoy long-term exercise adherence, including variety, self-motivation, self-efficacy, and positive decision making. Benefits of exercise include fitness, good health, weight management, more energy, better sleep, less stress, and overall well-being.  Medical   Heart Disease Risk Reduction Clinical staff conducted group or individual video education with verbal and written material and guidebook.  Patient learns our heart is our most vital organ as it circulates oxygen, nutrients, white blood cells, and hormones throughout the entire body, and carries waste away. Data supports a plant-based eating plan like the Pritikin Program for its effectiveness in slowing progression of and reversing heart disease. The video provides a number of recommendations to address heart disease.   Metabolic Syndrome and Belly Fat  Clinical staff conducted group or individual video education with verbal and written material and guidebook.  Patient learns what metabolic syndrome is, how it leads to heart disease, and how one can reverse it and keep it from coming back. You have metabolic syndrome if you have 3 of the following 5 criteria:  abdominal obesity, high blood pressure, high triglycerides, low HDL cholesterol, and high blood sugar.  Hypertension and Heart Disease Clinical staff conducted group or individual video education with verbal and written material and guidebook.  Patient learns that high blood pressure, or hypertension, is  very common in the [place]. Hypertension is largely due to excessive salt intake, but other important risk factors include being overweight, physical inactivity, drinking too much alcohol, smoking, and not eating enough potassium from fruits and vegetables. High blood pressure is a leading risk factor for heart attack, stroke, congestive heart failure, dementia, kidney failure, and premature death. Long-term effects of excessive salt intake include stiffening of the arteries and thickening of heart muscle and organ damage. Recommendations include ways to reduce hypertension and the risk of heart disease.  Diseases of Our Time - Focusing on Diabetes Clinical staff conducted group or individual video education with verbal and written material and guidebook.  Patient learns why the best way to stop diseases of our time is prevention, through food and other lifestyle changes. Medicine (such as prescription pills and surgeries) is often only a Band-Aid on the problem, not a long-term solution. Most common diseases of our time include obesity, type 2 diabetes, hypertension, heart disease, and cancer. The Pritikin Program is recommended and has been proven to help reduce, reverse, and/or prevent the damaging effects of metabolic syndrome.  Nutrition   Overview of the Pritikin Eating Plan  Clinical staff conducted group or individual video education with verbal and written material and guidebook.  Patient learns about the [place] for disease risk reduction. The [place] emphasizes a wide variety of unrefined, minimally-processed carbohydrates, like fruits, vegetables, whole  grains, and legumes. Go, Caution, and Stop food choices are explained. Plant-based and lean animal proteins are emphasized. Rationale provided for low sodium intake for blood pressure control, low added sugars for blood sugar stabilization, and low added fats and oils for coronary artery disease risk reduction and weight management.  Calorie Density  Clinical staff conducted group or individual video education with verbal and written material and guidebook.  Patient learns about calorie density and how it impacts the Pritikin Eating Plan. Knowing the characteristics of the food you choose will help you decide whether those foods will lead to weight gain or weight loss, and whether you want to consume more or less of them. Weight loss is usually a side effect of the Pritikin Eating Plan because of its focus on low calorie-dense foods.  Label Reading  Clinical staff conducted group or individual video education with verbal and written material and guidebook.  Patient learns about the Pritikin recommended label reading guidelines and corresponding recommendations regarding calorie density, added sugars, sodium content, and whole grains.  Dining Out - Part 1  Clinical staff conducted group or individual video education with verbal and written material and guidebook.  Patient learns that restaurant meals can be sabotaging because they can be so high in calories, fat, sodium, and/or sugar. Patient learns recommended strategies on how to positively address this and avoid unhealthy pitfalls.  Facts on Fats  Clinical staff conducted group or individual video education with verbal and written material and guidebook.  Patient learns that lifestyle modifications can be just as effective, if not more so, as many medications for lowering your risk of heart disease. A Pritikin lifestyle can help to reduce your risk of inflammation and atherosclerosis (cholesterol build-up, or plaque, in the artery walls). Lifestyle  interventions such as dietary choices and physical activity address the cause of atherosclerosis. A review of the types of fats and their impact on blood cholesterol levels, along with dietary recommendations to reduce fat intake is also included.  Nutrition Action Plan  Clinical staff conducted group or individual video  education with verbal and written material and guidebook.  Patient learns how to incorporate Pritikin recommendations into their lifestyle. Recommendations include planning and keeping personal health goals in mind as an important part of their success.  Healthy Mind-Set    Healthy Minds, Bodies, Hearts  Clinical staff conducted group or individual video education with verbal and written material and guidebook.  Patient learns how to identify when they are stressed. Video will discuss the impact of that stress, as well as the many benefits of stress management. Patient will also be introduced to stress management techniques. The way we think, act, and feel has an impact on our hearts.  How Our Thoughts Can Heal Our Hearts  Clinical staff conducted group or individual video education with verbal and written material and guidebook.  Patient learns that negative thoughts can cause depression and anxiety. This can result in negative lifestyle behavior and serious health problems. Cognitive behavioral therapy is an effective method to help control our thoughts in order to change and improve our emotional outlook.  Additional Videos:  Exercise    Improving Performance  Clinical staff conducted group or individual video education with verbal and written material and guidebook.  Patient learns to use a non-linear approach by alternating intensity levels and lengths of time spent exercising to help burn more calories and lose more body fat. Cardiovascular exercise helps improve heart health, metabolism, hormonal balance, blood sugar control, and recovery from fatigue. Resistance training  improves strength, endurance, balance, coordination, reaction time, metabolism, and muscle mass. Flexibility exercise improves circulation, posture, and balance. Seek guidance from your physician and exercise physiologist before implementing an exercise routine and learn your capabilities and proper form for all exercise.  Introduction to Yoga  Clinical staff conducted group or individual video education with verbal and written material and guidebook.  Patient learns about yoga, a discipline of the coming together of mind, breath, and body. The benefits of yoga include improved flexibility, improved range of motion, better posture and core strength, increased lung function, weight loss, and positive self-image. Yoga's heart health benefits include lowered blood pressure, healthier heart rate, decreased cholesterol and triglyceride levels, improved immune function, and reduced stress. Seek guidance from your physician and exercise physiologist before implementing an exercise routine and learn your capabilities and proper form for all exercise.  Medical   Aging: Enhancing Your Quality of Life  Clinical staff conducted group or individual video education with verbal and written material and guidebook.  Patient learns key strategies and recommendations to stay in good physical health and enhance quality of life, such as prevention strategies, having an advocate, securing a [place], and keeping a list of medications and system for tracking them. It also discusses how to avoid risk for bone loss.  Biology of Weight Control  Clinical staff conducted group or individual video education with verbal and written material and guidebook.  Patient learns that weight gain occurs because we consume more calories than we burn (eating more, moving less). Even if your body weight is normal, you may have higher ratios of fat compared to muscle mass. Too much body fat puts you at increased  risk for cardiovascular disease, heart attack, stroke, type 2 diabetes, and obesity-related cancers. In addition to exercise, following the [place] can help reduce your risk.  Decoding Lab Results  Clinical staff conducted group or individual video education with verbal and written material and guidebook.  Patient learns that lab test reflects one  measurement whose values change over time and are influenced by many factors, including medication, stress, sleep, exercise, food, hydration, pre-existing medical conditions, and more. It is recommended to use the knowledge from this video to become more involved with your lab results and evaluate your numbers to speak with your doctor.   Diseases of Our Time - Overview  Clinical staff conducted group or individual video education with verbal and written material and guidebook.  Patient learns that according to the CDC, 50% to 70% of chronic diseases (such as obesity, type 2 diabetes, elevated lipids, hypertension, and heart disease) are avoidable through lifestyle improvements including healthier food choices, listening to satiety cues, and increased physical activity.  Sleep Disorders Clinical staff conducted group or individual video education with verbal and written material and guidebook.  Patient learns how good quality and duration of sleep are important to overall health and well-being. Patient also learns about sleep disorders and how they impact health along with recommendations to address them, including discussing with a physician.  Nutrition  Dining Out - Part 2 Clinical staff conducted group or individual video education with verbal and written material and guidebook.  Patient learns how to plan ahead and communicate in order to maximize their dining experience in a healthy and nutritious manner. Included are recommended food choices based on the type of restaurant the patient is visiting.   Fueling a Development worker, international aid conducted group or individual video education with verbal and written material and guidebook.  There is a strong connection between our food choices and our health. Diseases like obesity and type 2 diabetes are very prevalent and are in large-part due to lifestyle choices. The Pritikin Eating Plan provides plenty of food and hunger-curbing satisfaction. It is easy to follow, affordable, and helps reduce health risks.  Menu Workshop  Clinical staff conducted group or individual video education with verbal and written material and guidebook.  Patient learns that restaurant meals can sabotage health goals because they are often packed with calories, fat, sodium, and sugar. Recommendations include strategies to plan ahead and to communicate with the manager, chef, or server to help order a healthier meal.  Planning Your Eating Strategy  Clinical staff conducted group or individual video education with verbal and written material and guidebook.  Patient learns about the [place] and its benefit of reducing the risk of disease. The [place] does not focus on calories. Instead, it emphasizes high-quality, nutrient-rich foods. By knowing the characteristics of the foods, we choose, we can determine their calorie density and make informed decisions.  Targeting Your Nutrition Priorities  Clinical staff conducted group or individual video education with verbal and written material and guidebook.  Patient learns that lifestyle habits have a tremendous impact on disease risk and progression. This video provides eating and physical activity recommendations based on your personal health goals, such as reducing LDL cholesterol, losing weight, preventing or controlling type 2 diabetes, and reducing high blood pressure.  Vitamins and Minerals  Clinical staff conducted group or individual video education with verbal and written material and guidebook.  Patient learns different ways to  obtain key vitamins and minerals, including through a recommended healthy diet. It is important to discuss all supplements you take with your doctor.   Healthy Mind-Set    Smoking Cessation  Clinical staff conducted group or individual video education with verbal and written material and guidebook.  Patient learns that cigarette smoking and tobacco addiction pose a serious health  risk which affects millions of people. Stopping smoking will significantly reduce the risk of heart disease, lung disease, and many forms of cancer. Recommended strategies for quitting are covered, including working with your doctor to develop a successful plan.  Culinary   Becoming a Financial trader conducted group or individual video education with verbal and written material and guidebook.  Patient learns that cooking at home can be healthy, cost-effective, quick, and puts them in control. Keys to cooking healthy recipes will include looking at your recipe, assessing your equipment needs, planning ahead, making it simple, choosing cost-effective seasonal ingredients, and limiting the use of added fats, salts, and sugars.  Cooking - Breakfast and Snacks  Clinical staff conducted group or individual video education with verbal and written material and guidebook.  Patient learns how important breakfast is to satiety and nutrition through the entire day. Recommendations include key foods to eat during breakfast to help stabilize blood sugar levels and to prevent overeating at meals later in the day. Planning ahead is also a key component.  Cooking - Human resources officer conducted group or individual video education with verbal and written material and guidebook.  Patient learns eating strategies to improve overall health, including an approach to cook more at home. Recommendations include thinking of animal protein as a side on your plate rather than center stage and focusing instead on lower  calorie dense options like vegetables, fruits, whole grains, and plant-based proteins, such as beans. Making sauces in large quantities to freeze for later and leaving the skin on your vegetables are also recommended to maximize your experience.  Cooking - Healthy Salads and Dressing Clinical staff conducted group or individual video education with verbal and written material and guidebook.  Patient learns that vegetables, fruits, whole grains, and legumes are the foundations of the [place]. Recommendations include how to incorporate each of these in flavorful and healthy salads, and how to create homemade salad dressings. Proper handling of ingredients is also covered. Cooking - Soups and [employer] - Soups and Desserts Clinical staff conducted group or individual video education with verbal and written material and guidebook.  Patient learns that Pritikin soups and desserts make for easy, nutritious, and delicious snacks and meal components that are low in sodium, fat, sugar, and calorie density, while high in vitamins, minerals, and filling fiber. Recommendations include simple and healthy ideas for soups and desserts.   Overview     The Pritikin Solution Program Overview Clinical staff conducted group or individual video education with verbal and written material and guidebook.  Patient learns that the results of the [place] Program have been documented in more than 100 articles published in peer-reviewed journals, and the benefits include reducing risk factors for (and, in some cases, even reversing) high cholesterol, high blood pressure, type 2 diabetes, obesity, and more! An overview of the three key pillars of the Pritikin Program will be covered: eating well, doing regular exercise, and having a healthy mind-set.  WORKSHOPS  Exercise: Exercise Basics: Building Your Action Plan Clinical staff led group instruction and group discussion with PowerPoint presentation and  patient guidebook. To enhance the learning environment the use of posters, models and videos may be added. At the conclusion of this workshop, patients will comprehend the difference between physical activity and exercise, as well as the benefits of incorporating both, into their routine. Patients will understand the FITT (Frequency, Intensity, Time, and Type) principle and how to use it  to build an exercise action plan. In addition, safety concerns and other considerations for exercise and cardiac rehab will be addressed by the presenter. The purpose of this lesson is to promote a comprehensive and effective weekly exercise routine in order to improve patients' overall level of fitness.   Managing Heart Disease: Your Path to a Healthier Heart Clinical staff led group instruction and group discussion with PowerPoint presentation and patient guidebook. To enhance the learning environment the use of posters, models and videos may be added.At the conclusion of this workshop, patients will understand the anatomy and physiology of the heart. Additionally, they will understand how Pritikin's three pillars impact the risk factors, the progression, and the management of heart disease.  The purpose of this lesson is to provide a high-level overview of the heart, heart disease, and how the Pritikin lifestyle positively impacts risk factors.  Exercise Biomechanics Clinical staff led group instruction and group discussion with PowerPoint presentation and patient guidebook. To enhance the learning environment the use of posters, models and videos may be added. Patients will learn how the structural parts of their bodies function and how these functions impact their daily activities, movement, and exercise. Patients will learn how to promote a neutral spine, learn how to manage pain, and identify ways to improve their physical movement in order to promote healthy living. The purpose of this lesson is to expose  patients to common physical limitations that impact physical activity. Participants will learn practical ways to adapt and manage aches and pains, and to minimize their effect on regular exercise. Patients will learn how to maintain good posture while sitting, walking, and lifting.  Balance Training and Fall Prevention  Clinical staff led group instruction and group discussion with PowerPoint presentation and patient guidebook. To enhance the learning environment the use of posters, models and videos may be added. At the conclusion of this workshop, patients will understand the importance of their sensorimotor skills (vision, proprioception, and the vestibular system) in maintaining their ability to balance as they age. Patients will apply a variety of balancing exercises that are appropriate for their current level of function. Patients will understand the common causes for poor balance, possible solutions to these problems, and ways to modify their physical environment in order to minimize their fall risk. The purpose of this lesson is to teach patients about the importance of maintaining balance as they age and ways to minimize their risk of falling.  WORKSHOPS   Nutrition:  Fueling a Scientist, research (physical sciences) led group instruction and group discussion with PowerPoint presentation and patient guidebook. To enhance the learning environment the use of posters, models and videos may be added. Patients will review the foundational principles of the [place] and understand what constitutes a serving size in each of the food groups. Patients will also learn Pritikin-friendly foods that are better choices when away from home and review make-ahead meal and snack options. Calorie density will be reviewed and applied to three nutrition priorities: weight maintenance, weight loss, and weight gain. The purpose of this lesson is to reinforce (in a group setting) the key concepts around what  patients are recommended to eat and how to apply these guidelines when away from home by planning and selecting Pritikin-friendly options. Patients will understand how calorie density may be adjusted for different weight management goals.  Mindful Eating  Clinical staff led group instruction and group discussion with PowerPoint presentation and patient guidebook. To enhance the learning environment the use of  posters, models and videos may be added. Patients will briefly review the concepts of the [place] and the importance of low-calorie dense foods. The concept of mindful eating will be introduced as well as the importance of paying attention to internal hunger signals. Triggers for non-hunger eating and techniques for dealing with triggers will be explored. The purpose of this lesson is to provide patients with the opportunity to review the basic principles of the [place], discuss the value of eating mindfully and how to measure internal cues of hunger and fullness using the Hunger Scale. Patients will also discuss reasons for non-hunger eating and learn strategies to use for controlling emotional eating.  Targeting Your Nutrition Priorities Clinical staff led group instruction and group discussion with PowerPoint presentation and patient guidebook. To enhance the learning environment the use of posters, models and videos may be added. Patients will learn how to determine their genetic susceptibility to disease by reviewing their family history. Patients will gain insight into the importance of diet as part of an overall healthy lifestyle in mitigating the impact of genetics and other environmental insults. The purpose of this lesson is to provide patients with the opportunity to assess their personal nutrition priorities by looking at their family history, their own health history and current risk factors. Patients will also be able to discuss ways of prioritizing and modifying  the [place] for their highest risk areas  Menu  Clinical staff led group instruction and group discussion with PowerPoint presentation and patient guidebook. To enhance the learning environment the use of posters, models and videos may be added. Using menus brought in from [employer], or printed from [employer], patients will apply the [place] dining out guidelines that were presented in the [employer] video. Patients will also be able to practice these guidelines in a variety of provided scenarios. The purpose of this lesson is to provide patients with the opportunity to practice hands-on learning of the [place] with actual menus and practice scenarios.  Label Reading Clinical staff led group instruction and group discussion with PowerPoint presentation and patient guidebook. To enhance the learning environment the use of posters, models and videos may be added. Patients will review and discuss the Pritikin label reading guidelines presented in Pritikin's Label Reading Educational series video. Using fool labels brought in from local grocery stores and markets, patients will apply the label reading guidelines and determine if the packaged food meet the Pritikin guidelines. The purpose of this lesson is to provide patients with the opportunity to review, discuss, and practice hands-on learning of the Pritikin Label Reading guidelines with actual packaged food labels. [place] Workshops are designed to teach patients ways to prepare quick, simple, and affordable recipes at home. The importance of nutrition's role in chronic disease risk reduction is reflected in its emphasis in the overall Pritikin program. By learning how to prepare essential core Pritikin Eating Plan recipes, patients will increase control over what they eat; be able to customize the flavor of foods without the use of added salt, sugar, or  fat; and improve the quality of the food they consume. By learning a set of core recipes which are easily assembled, quickly prepared, and affordable, patients are more likely to prepare more healthy foods at home. These workshops focus on convenient breakfasts, simple entres, side dishes, and desserts which can be prepared with minimal effort and are consistent with nutrition recommendations for  cardiovascular risk reduction. Cooking [employer] are taught by a Engineer, materials (RD) who has been trained by the [employer]. The chef or RD has a clear understanding of the importance of minimizing - if not completely eliminating - added fat, sugar, and sodium in recipes. Throughout the series of [place] Workshop sessions, patients will learn about healthy ingredients and efficient methods of cooking to build confidence in their capability to prepare    Cooking School weekly topics:  Adding Flavor- Sodium-Free  Fast and Healthy Breakfasts  Powerhouse Plant-Based Proteins  Satisfying Salads and Dressings  Simple Sides and Sauces  International Cuisine-Spotlight on the [place] Zones  Delicious Desserts  Savory Soups  Efficiency Cooking - Meals in a Snap  Tasty Appetizers and Snacks  Comforting Weekend Breakfasts  One-Pot Wonders   Fast Evening Meals  Easy [place] (Psychosocial): New Thoughts, New Behaviors Clinical staff led group instruction and group discussion with PowerPoint presentation and patient guidebook. To enhance the learning environment the use of posters, models and videos may be added. Patients will learn and practice techniques for developing effective health and lifestyle goals. Patients will be able to effectively apply the goal setting process learned to develop at least one new personal goal.  The purpose of this lesson is to expose patients to a new skill set of behavior  modification techniques such as techniques setting SMART goals, overcoming barriers, and achieving new thoughts and new behaviors.  Managing Moods and Relationships Clinical staff led group instruction and group discussion with PowerPoint presentation and patient guidebook. To enhance the learning environment the use of posters, models and videos may be added. Patients will learn how emotional and chronic stress factors can impact their health and relationships. They will learn healthy ways to manage their moods and utilize positive coping mechanisms. In addition, ICR patients will learn ways to improve communication skills. The purpose of this lesson is to expose patients to ways of understanding how one's mood and health are intimately connected. Developing a healthy outlook can help build positive relationships and connections with others. Patients will understand the importance of utilizing effective communication skills that include actively listening and being heard. They will learn and understand the importance of the "4 Cs" and especially Connections in fostering of a Healthy Mind-Set.  Healthy Sleep for a Healthy Heart Clinical staff led group instruction and group discussion with PowerPoint presentation and patient guidebook. To enhance the learning environment the use of posters, models and videos may be added. At the conclusion of this workshop, patients will be able to demonstrate knowledge of the importance of sleep to overall health, well-being, and quality of life. They will understand the symptoms of, and treatments for, common sleep disorders. Patients will also be able to identify daytime and nighttime behaviors which impact sleep, and they will be able to apply these tools to help manage sleep-related challenges. The purpose of this lesson is to provide patients with a general overview of sleep and outline the importance of quality sleep. Patients will learn about a few of the most common  sleep disorders. Patients will also be introduced to the concept of "sleep hygiene," and discover ways to self-manage certain sleeping problems through simple daily behavior changes. Finally, the workshop will motivate patients by clarifying the links between quality sleep and their goals of heart-healthy living.   Recognizing and Reducing Stress Clinical staff led group instruction and group  discussion with PowerPoint presentation and patient guidebook. To enhance the learning environment the use of posters, models and videos may be added. At the conclusion of this workshop, patients will be able to understand the types of stress reactions, differentiate between acute and chronic stress, and recognize the impact that chronic stress has on their health. They will also be able to apply different coping mechanisms, such as reframing negative self-talk. Patients will have the opportunity to practice a variety of stress management techniques, such as deep abdominal breathing, progressive muscle relaxation, and/or guided imagery.  The purpose of this lesson is to educate patients on the role of stress in their lives and to provide healthy techniques for coping with it.  Learning Barriers/Preferences:  Learning Barriers/Preferences - 11/02/21 1140       Learning Barriers/Preferences   Learning Barriers Sight   Pt wears Glasses   Learning Preferences Individual Instruction;Group Instruction;Skilled Demonstration;Written Material;Pictoral             Education Topics:  Knowledge Questionnaire Score:  Knowledge Questionnaire Score - 11/02/21 1100       Knowledge Questionnaire Score   Pre Score 19/24             Core Components/Risk Factors/Patient Goals at Admission:  Personal Goals and Risk Factors at Admission - 11/02/21 1101       Core Components/Risk Factors/Patient Goals on Admission    Weight Management Yes;Weight Maintenance    Intervention Weight Management: Develop a combined  nutrition and exercise program designed to reach desired caloric intake, while maintaining appropriate intake of nutrient and fiber, sodium and fats, and appropriate energy expenditure required for the weight goal.;Weight Management: Provide education and appropriate resources to help participant work on and attain dietary goals.    Admit Weight 214 lb (97.1 kg)    Expected Outcomes Short Term: Continue to assess and modify interventions until short term weight is achieved;Long Term: Adherence to nutrition and physical activity/exercise program aimed toward attainment of established weight goal;Weight Maintenance: Understanding of the daily nutrition guidelines, which includes 25-35% calories from fat, 7% or less cal from saturated fats, less than $RemoveB'200mg'XEfQtWTx$  cholesterol, less than 1.5gm of sodium, & 5 or more servings of fruits and vegetables daily;Understanding recommendations for meals to include 15-35% energy as protein, 25-35% energy from fat, 35-60% energy from carbohydrates, less than $RemoveB'200mg'mmaPWEbD$  of dietary cholesterol, 20-35 gm of total fiber daily;Understanding of distribution of calorie intake throughout the day with the consumption of 4-5 meals/snacks    Hypertension Yes    Intervention Provide education on lifestyle modifcations including regular physical activity/exercise, weight management, moderate sodium restriction and increased consumption of fresh fruit, vegetables, and low fat dairy, alcohol moderation, and smoking cessation.;Monitor prescription use compliance.    Expected Outcomes Short Term: Continued assessment and intervention until BP is < 140/54mm HG in hypertensive participants. < 130/31mm HG in hypertensive participants with diabetes, heart failure or chronic kidney disease.;Long Term: Maintenance of blood pressure at goal levels.    Lipids Yes    Intervention Provide education and support for participant on nutrition & aerobic/resistive exercise along with prescribed medications to achieve  LDL '70mg'$ , HDL >$Remo'40mg'DcyFJ$ .    Expected Outcomes Short Term: Participant states understanding of desired cholesterol values and is compliant with medications prescribed. Participant is following exercise prescription and nutrition guidelines.;Long Term: Cholesterol controlled with medications as prescribed, with individualized exercise RX and with personalized nutrition plan. Value goals: LDL < $Rem'70mg'wmVe$ , HDL > 40 mg.    Personal Goal Other Yes  Personal Goal Short term: Upper body strength Long term: Maintain Wt loss and flexibily    Intervention Will continue to monitor pt and progress workloads as tolerated without sign or symptom    Expected Outcomes Pt will achieve his goals             Core Components/Risk Factors/Patient Goals Review:   Goals and Risk Factor Review     Row Name 12/05/21 1617 12/05/21 1619           Core Components/Risk Factors/Patient Goals Review   Personal Goals Review Weight Management/Obesity;Hypertension;Lipids;Other Weight Management/Obesity;Hypertension;Lipids;Other      Review Alvester Chou was off to a great start with cardiac rehab with the completion of 7 exercise sessions Alvester Chou was off to a great start with cardiac rehab with the completion of 7 exercise sessions and 1 education class until he was placed on medical hold due to syncopal fall he had at home. Continued work up pending clearance to return to CR.  Unable to assess weight goal as there are discrepancies with the data.  Alvester Chou is compliant with his medications and statin therapy. Alvester Chou desires to improve his flexibility along wtih upper body strength.  Hopeful he will continue to work on these areas of needs when he is able to resume cardiac rehab.      Expected Outcomes -- Alvester Chou will adopt a heart healthy lifestyle in accordance to the three pillars for [facility]: Exercise, heart healthy nutrtitionand healthy mind set.               Core Components/Risk Factors/Patient Goals at  Discharge (Final Review):   Goals and Risk Factor Review - 12/05/21 1619       Core Components/Risk Factors/Patient Goals Review   Personal Goals Review Weight Management/Obesity;Hypertension;Lipids;Other    Review Alvester Chou was off to a great start with cardiac rehab with the completion of 7 exercise sessions and 1 education class until he was placed on medical hold due to syncopal fall he had at home. Continued work up pending clearance to return to CR.  Unable to assess weight goal as there are discrepancies with the data.  Alvester Chou is compliant with his medications and statin therapy. Alvester Chou desires to improve his flexibility along wtih upper body strength.  Hopeful he will continue to work on these areas of needs when he is able to resume cardiac rehab.    Expected Outcomes Alvester Chou will adopt a heart healthy lifestyle in accordance to the three pillars for [facility]: Exercise, heart healthy nutrtitionand healthy mind set.             ITP Comments:  ITP Comments     Row Name 11/02/21 1043 12/06/21 0838         ITP Comments Dr Fransico Him MD, Medical Director, Introduction to [facility]  Education Reviewed. Initial Resource Packet Given to the Patient 30 day ITP Review pt presently on medical hold pending Syncopal episode work up               Comments: Presently on medical hold pending Syncopal episode.  Last exercise session 7/10 with cardiology follow up on 7/13.  Await clearance for this pt to return to cardiac rehab. . Recommend continued exercise and life style modification education including  stress management and relaxation techniques to decrease cardiac risk profile.  Cherre Huger, BSN Cardiac and Training and development officer

## 2021-12-08 ENCOUNTER — Encounter (HOSPITAL_COMMUNITY): Payer: Medicare HMO

## 2021-12-11 ENCOUNTER — Encounter (HOSPITAL_COMMUNITY): Payer: Medicare HMO

## 2021-12-13 ENCOUNTER — Encounter (HOSPITAL_COMMUNITY): Payer: Medicare HMO

## 2021-12-15 ENCOUNTER — Encounter (HOSPITAL_COMMUNITY): Payer: Medicare HMO

## 2021-12-15 DIAGNOSIS — H2511 Age-related nuclear cataract, right eye: Secondary | ICD-10-CM | POA: Diagnosis not present

## 2021-12-15 DIAGNOSIS — H269 Unspecified cataract: Secondary | ICD-10-CM | POA: Diagnosis not present

## 2021-12-18 ENCOUNTER — Encounter (HOSPITAL_COMMUNITY): Payer: Medicare HMO

## 2021-12-20 ENCOUNTER — Encounter (HOSPITAL_COMMUNITY): Payer: Medicare HMO

## 2021-12-20 DIAGNOSIS — R55 Syncope and collapse: Secondary | ICD-10-CM | POA: Diagnosis not present

## 2021-12-21 ENCOUNTER — Telehealth (HOSPITAL_COMMUNITY): Payer: Self-pay | Admitting: *Deleted

## 2021-12-21 NOTE — Telephone Encounter (Signed)
Called and spoke to [employer] for his general well being and how he is recovering from cataract surgery last week.  He  is doing well and has an upcoming follow up appt on tomorrow.  Advised I had not received any clearance to return to exercise from a cardiology standpoint.  Has upcoming eco on 8/23 and will follow up with cardiology on 8/28.  Will continue to hold on cardiac rehab until follow up completed on 8/28. Pt verbalized understanding. Will add visits for cardiac rehab.

## 2021-12-22 ENCOUNTER — Encounter (HOSPITAL_COMMUNITY): Payer: Medicare HMO

## 2021-12-25 ENCOUNTER — Encounter (HOSPITAL_COMMUNITY): Payer: Medicare HMO

## 2021-12-27 ENCOUNTER — Encounter (HOSPITAL_COMMUNITY): Payer: Medicare HMO

## 2021-12-27 NOTE — Addendum Note (Signed)
Encounter addended by: [place], [place] Basham G, RD on: 12/27/2021 10:45 AM  Actions taken: Flowsheet data copied forward, Flowsheet accepted

## 2021-12-29 ENCOUNTER — Encounter (HOSPITAL_COMMUNITY): Payer: Medicare HMO

## 2022-01-03 ENCOUNTER — Ambulatory Visit (HOSPITAL_COMMUNITY): Payer: Medicare HMO | Attending: Cardiovascular Disease

## 2022-01-03 ENCOUNTER — Encounter (HOSPITAL_COMMUNITY): Payer: Self-pay | Admitting: *Deleted

## 2022-01-03 DIAGNOSIS — Z951 Presence of aortocoronary bypass graft: Secondary | ICD-10-CM

## 2022-01-03 DIAGNOSIS — I2102 ST elevation (STEMI) myocardial infarction involving left anterior descending coronary artery: Secondary | ICD-10-CM

## 2022-01-03 DIAGNOSIS — I255 Ischemic cardiomyopathy: Secondary | ICD-10-CM | POA: Diagnosis not present

## 2022-01-03 LAB — ECHOCARDIOGRAM LIMITED
Area-P 1/2: 4.1 cm2
S' Lateral: 4.9 cm

## 2022-01-03 NOTE — Progress Notes (Signed)
Cardiac Individual Treatment Plan  Patient Details  Name: Timothy Munoz MRN: [id number] Date of Birth: Jan 15, 1946 Referring Provider:   Flowsheet Row INTENSIVE CARDIAC REHAB ORIENT from 11/02/2021 in [facility]  Referring Provider Dr. 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  Patient's Home Medications on Admission:  Current Outpatient Medications:    apixaban (ELIQUIS) 5 MG TABS tablet, Take 1 tablet (5 mg total) by mouth 2 (two) times daily., Disp: 180 tablet, Rfl: 3   carvedilol (COREG) 6.25 MG tablet, Take 1 tablet (6.25 mg total) by mouth 2 (two) times daily with a meal., Disp: 180 tablet, Rfl: 1   clopidogrel (PLAVIX) 75 MG tablet, Take 1 tablet (75 mg total) by mouth daily., Disp: 30 tablet, Rfl: 11   dapagliflozin propanediol (FARXIGA) 5 MG TABS tablet, Take 5 mg by mouth daily., Disp: , Rfl:    furosemide (LASIX) 40 MG tablet, Take 1 tablet (40 mg total) by mouth daily as needed for fluid. (Patient not taking: Reported on 11/23/2021), Disp: 90 tablet, Rfl: 3   potassium chloride SA (KLOR-CON M) 20 MEQ tablet, Take 1 tablet (20 mEq total) by mouth daily as needed (take along with furosemide). (Patient not taking: Reported on 11/23/2021), Disp: 90 tablet, Rfl: 3   primidone (MYSOLINE) 50 MG tablet, Take 1-2 tablets daily for tremor (Patient taking differently: Take 100 mg by mouth daily.), Disp: 180 tablet, Rfl: 3   rosuvastatin (CRESTOR) 20 MG tablet, Take 1 tablet (20 mg total) by mouth daily., Disp: 90 tablet, Rfl: 3   sacubitril-valsartan (ENTRESTO) 24-26 MG, Take 1 tablet by mouth 2 (two) times daily., Disp: 60 tablet, Rfl: 3   tamsulosin (FLOMAX) 0.4 MG CAPS capsule, Take 1 capsule (0.4 mg total) by mouth daily after breakfast., Disp: 30 capsule, Rfl:  4  Past Medical History: Past Medical History:  Diagnosis Date   Coronary artery disease    Hyperlipidemia    mild    Hypertension    Knee pain    Obesity    Tremor    Tremor, essential 03/04/2019    Tobacco Use: Social History   Tobacco Use  Smoking Status Former  Smokeless Tobacco Current   Types: Chew    Labs: Review Flowsheet  More data exists      Latest Ref Rng & Units 01/22/2021 01/23/2021 01/24/2021 01/25/2021 01/26/2021  Labs for ITP Cardiac and Pulmonary Rehab  O2 Saturation % 57.3  56.3  C 45.2  51.3  49.6  48.1  61.2  68.7     Details      C Corrected result   Multiple values from one day are sorted in reverse-chronological order         Capillary Blood Glucose: Lab Results  Component Value Date   GLUCAP 113 (H) 01/27/2021   GLUCAP 124 (H) 01/26/2021   GLUCAP 98 01/26/2021   GLUCAP 99 01/26/2021   GLUCAP 130 (H) 01/26/2021     Exercise Target Goals: Exercise Program Goal: Individual exercise prescription set using results from initial 6 min walk test and THRR while considering  patient's activity barriers and safety.   Exercise Prescription Goal: Initial exercise prescription builds to 30-45 minutes a day of aerobic activity, 2-3 days per week.  Home exercise guidelines  will be given to patient during program as part of exercise prescription that the participant will acknowledge.  Activity Barriers & Risk Stratification:   6 Minute Walk:   Oxygen Initial Assessment:   Oxygen Re-Evaluation:   Oxygen Discharge (Final Oxygen Re-Evaluation):   Initial Exercise Prescription:   Perform Capillary Blood Glucose checks as needed.  Exercise Prescription Changes:   Exercise Prescription Changes     Row Name 11/08/21 0825             Response to Exercise   Blood Pressure (Admit) 120/64       Blood Pressure (Exercise) 134/82       Blood Pressure (Exit) 102/60       Heart Rate (Admit) 70 bpm       Heart Rate (Exercise) 105 bpm        Heart Rate (Exit) 70 bpm       Rating of Perceived Exertion (Exercise) 11       Perceived Dyspnea (Exercise) 0       Symptoms 0       Comments Pt first day in the CRP2 program       Duration Progress to 30 minutes of  aerobic without signs/symptoms of physical distress       Intensity THRR unchanged         Progression   Progression Continue to progress workloads to maintain intensity without signs/symptoms of physical distress.       Average METs 2.35         Resistance Training   Training Prescription No         NuStep   Level 2       SPM 80       Minutes 15       METs 2.6         Arm Ergometer   Level 1.5       RPM 85       Minutes 15       METs 2                Exercise Comments:   Exercise Comments     Row Name 11/08/21 0831           Exercise Comments Pt first day in the CRP2 program. Pt tolerated exercise well with an average MET level of 2.35. Pt is learning his THRR, RPE, and ExRx.                Exercise Goals and Review:   Exercise Goals Re-Evaluation :  Exercise Goals Re-Evaluation     Row Name 11/08/21 0829             Exercise Goal Re-Evaluation   Exercise Goals Review Increase Physical Activity;Increase Strength and Stamina;Able to understand and use rate of perceived exertion (RPE) scale;Knowledge and understanding of Target Heart Rate Range (THRR);Understanding of Exercise Prescription       Comments Pt first day in the CRP2 program. Pt tolerated exercise well with an average MET level of 2.35. Pt is learning his THRR, RPE, and ExRx.       Expected Outcomes Will continue to monitor pt and progress workloads as tolerated without sign or symptom                Discharge Exercise Prescription (Final Exercise Prescription Changes):  Exercise Prescription Changes - 11/08/21 0825       Response to Exercise   Blood Pressure (Admit) 120/64    Blood Pressure (  Exercise) 134/82    Blood Pressure (Exit) 102/60    Heart Rate  (Admit) 70 bpm    Heart Rate (Exercise) 105 bpm    Heart Rate (Exit) 70 bpm    Rating of Perceived Exertion (Exercise) 11    Perceived Dyspnea (Exercise) 0    Symptoms 0    Comments Pt first day in the CRP2 program    Duration Progress to 30 minutes of  aerobic without signs/symptoms of physical distress    Intensity THRR unchanged      Progression   Progression Continue to progress workloads to maintain intensity without signs/symptoms of physical distress.    Average METs 2.35      Resistance Training   Training Prescription No      NuStep   Level 2    SPM 80    Minutes 15    METs 2.6      Arm Ergometer   Level 1.5    RPM 85    Minutes 15    METs 2             Nutrition:  Target Goals: Understanding of nutrition guidelines, daily intake of sodium <1540m, cholesterol <2026m calories 30% from fat and 7% or less from saturated fats, daily to have 5 or more servings of fruits and vegetables.  Biometrics:    Nutrition Therapy Plan and Nutrition Goals:  Nutrition Therapy & Goals - 12/27/21 1040       Nutrition Therapy   Diet Heart healthy diet    Drug/Food Interactions Statins/Certain Fruits      Personal Nutrition Goals   Nutrition Goal Patient to choose a daily variety of fruits, vegetables, whole grains, lean protein/plant protein, nonfat dairy as part of a heart healthy lifestyle    Personal Goal #2 Patient to limit to <150069mf sodium daily.    Personal Goal #3 Patient to identify and limit daily intake of sodium, trans fats, saturated fat, and refined carbohydrates    Comments BarAlvester Chous not attended cardiac rehab since 7/12 due to syncope event. He continues follow-up with cardiology. Patient declines Pritikin education classes to aid with nutrition support.      Intervention Plan   Intervention Prescribe, educate and counsel regarding individualized specific dietary modifications aiming towards targeted core components such as weight, hypertension, lipid  management, diabetes, heart failure and other comorbidities.    Expected Outcomes Short Term Goal: Understand basic principles of dietary content, such as calories, fat, sodium, cholesterol and nutrients.;Long Term Goal: Adherence to prescribed nutrition plan.             Nutrition Assessments:  MEDIFICTS Score Key: ?70 Need to make dietary changes  40-70 Heart Healthy Diet ? 40 Therapeutic Level Cholesterol Diet    Picture Your Plate Scores: <40<67healthy dietary pattern with much room for improvement. 41-50 Dietary pattern unlikely to meet recommendations for good health and room for improvement. 51-60 More healthful dietary pattern, with some room for improvement.  >60 Healthy dietary pattern, although there may be some specific behaviors that could be improved.    Nutrition Goals Re-Evaluation:  Nutrition Goals Re-Evaluation     [place] 11/29/21 0945 12/27/21 1040           Goals   Current Weight 202 lb 2.6 oz (91.7 kg)  weight from last attended class on 7/10 --      Expected Outcome Patient is down >60# since 08/29/2020 (start weight 268#). He has missed multiple classes due to a  syncope episode and continues follow-up with cardiology. They did rule out dehydration and stopped spirolactone due to elevated Potassium (5.5). Patient declines Pritikin education classes to aid with nutrition support. He reports that he eats out frequently and does very little cooking. Timothy Munoz has not attended cardiac rehab since 7/12 due to syncope episode; he continues follow-up with cardiology. Patient declines Pritikin education classes to aid with nutrition support.               Nutrition Goals Re-Evaluation:  Nutrition Goals Re-Evaluation     [place] Name 11/29/21 0945 12/27/21 1040           Goals   Current Weight 202 lb 2.6 oz (91.7 kg)  weight from last attended class on 7/10 --      Expected Outcome Patient is down >60# since 08/29/2020 (start weight 268#). He has missed  multiple classes due to a syncope episode and continues follow-up with cardiology. They did rule out dehydration and stopped spirolactone due to elevated Potassium (5.5). Patient declines Pritikin education classes to aid with nutrition support. He reports that he eats out frequently and does very little cooking. Timothy Munoz has not attended cardiac rehab since 7/12 due to syncope episode; he continues follow-up with cardiology. Patient declines Pritikin education classes to aid with nutrition support.               Nutrition Goals Discharge (Final Nutrition Goals Re-Evaluation):  Nutrition Goals Re-Evaluation - 12/27/21 1040       Goals   Expected Outcome Timothy Munoz has not attended cardiac rehab since 7/12 due to syncope episode; he continues follow-up with cardiology. Patient declines Pritikin education classes to aid with nutrition support.             Psychosocial: Target Goals: Acknowledge presence or absence of significant depression and/or stress, maximize coping skills, provide positive support system. Participant is able to verbalize types and ability to use techniques and skills needed for reducing stress and depression.  Initial Review & Psychosocial Screening:   Quality of Life Scores:  Scores of 19 and below usually indicate a poorer quality of life in these areas.  A difference of  2-3 points is a clinically meaningful difference.  A difference of 2-3 points in the total score of the Quality of Life Index has been associated with significant improvement in overall quality of life, self-image, physical symptoms, and general health in studies assessing change in quality of life.  PHQ-9: Review Flowsheet       11/02/2021  Depression screen PHQ 2/9  Decreased Interest 0  Down, Depressed, Hopeless 0  PHQ - 2 Score 0   Interpretation of Total Score  Total Score Depression Severity:  1-4 = Minimal depression, 5-9 = Mild depression, 10-14 = Moderate depression, 15-19 =  Moderately severe depression, 20-27 = Severe depression   Psychosocial Evaluation and Intervention:  Psychosocial Evaluation - 12/05/21 1615       Psychosocial Evaluation & Interventions   Comments Timothy Munoz is currently on hold for cardiac rehab due to syncopal fall in his home on 7/11.  He has complteted follow up with Cardiology APP  awaiting ok to return to cardiac rehab.  will complete more formal assessment of any psychosocial issues.    Expected Outcomes Timothy Munoz will display positive outlook and healthy coping skills.  Denies any new psycosocial barriers             Psychosocial Re-Evaluation:  Psychosocial Re-Evaluation     [place] Name 01/03/22 [phone]  Psychosocial Re-Evaluation   Comments presently on hold from exercise pending cardiology clearance to return.  unable to evaluate, pt last session was on 7/10                Psychosocial Discharge (Final Psychosocial Re-Evaluation):  Psychosocial Re-Evaluation - 01/03/22 1342       Psychosocial Re-Evaluation   Comments presently on hold from exercise pending cardiology clearance to return.  unable to evaluate, pt last session was on 7/10             Vocational Rehabilitation: Provide vocational rehab assistance to qualifying candidates.   Vocational Rehab Evaluation & Intervention:   Education: Education Goals: Education classes will be provided on a weekly basis, covering required topics. Participant will state understanding/return demonstration of topics presented.    Education     Row Name 11/08/21 0900     Education   Cardiac Education Topics [employer]   Educator Dietitian   Weekly Topic Satisfying Salads and Dressings   Instruction Review Code 1- Verbalizes Understanding   Class Start Time 0813   Class Stop Time 0900   Class Time Calculation (min) 47 min            Core Videos: Exercise    Move It!  Clinical staff conducted group or  individual video education with verbal and written material and guidebook.  Patient learns the recommended Pritikin exercise program. Exercise with the goal of living a long, healthy life. Some of the health benefits of exercise include controlled diabetes, healthier blood pressure levels, improved cholesterol levels, improved heart and lung capacity, improved sleep, and better body composition. Everyone should speak with their doctor before starting or changing an exercise routine.  Biomechanical Limitations Clinical staff conducted group or individual video education with verbal and written material and guidebook.  Patient learns how biomechanical limitations can impact exercise and how we can mitigate and possibly overcome limitations to have an impactful and balanced exercise routine.  Body Composition Clinical staff conducted group or individual video education with verbal and written material and guidebook.  Patient learns that body composition (ratio of muscle mass to fat mass) is a key component to assessing overall fitness, rather than body weight alone. Increased fat mass, especially visceral belly fat, can put [place] at increased risk for metabolic syndrome, type 2 diabetes, heart disease, and even death. It is recommended to combine diet and exercise (cardiovascular and resistance training) to improve your body composition. Seek guidance from your physician and exercise physiologist before implementing an exercise routine.  Exercise Action Plan Clinical staff conducted group or individual video education with verbal and written material and guidebook.  Patient learns the recommended strategies to achieve and enjoy long-term exercise adherence, including variety, self-motivation, self-efficacy, and positive decision making. Benefits of exercise include fitness, good health, weight management, more energy, better sleep, less stress, and overall well-being.  Medical   Heart Disease Risk  Reduction Clinical staff conducted group or individual video education with verbal and written material and guidebook.  Patient learns our heart is our most vital organ as it circulates oxygen, nutrients, white blood cells, and hormones throughout the entire body, and carries waste away. Data supports a plant-based eating plan like the Pritikin Program for its effectiveness in slowing progression of and reversing heart disease. The video provides a number of recommendations to address heart disease.   Metabolic Syndrome and Belly Fat  Clinical staff conducted group  or individual video education with verbal and written material and guidebook.  Patient learns what metabolic syndrome is, how it leads to heart disease, and how one can reverse it and keep it from coming back. You have metabolic syndrome if you have 3 of the following 5 criteria: abdominal obesity, high blood pressure, high triglycerides, low HDL cholesterol, and high blood sugar.  Hypertension and Heart Disease Clinical staff conducted group or individual video education with verbal and written material and guidebook.  Patient learns that high blood pressure, or hypertension, is very common in the [place]. Hypertension is largely due to excessive salt intake, but other important risk factors include being overweight, physical inactivity, drinking too much alcohol, smoking, and not eating enough potassium from fruits and vegetables. High blood pressure is a leading risk factor for heart attack, stroke, congestive heart failure, dementia, kidney failure, and premature death. Long-term effects of excessive salt intake include stiffening of the arteries and thickening of heart muscle and organ damage. Recommendations include ways to reduce hypertension and the risk of heart disease.  Diseases of Our Time - Focusing on Diabetes Clinical staff conducted group or individual video education with verbal and written material and guidebook.   Patient learns why the best way to stop diseases of our time is prevention, through food and other lifestyle changes. Medicine (such as prescription pills and surgeries) is often only a Band-Aid on the problem, not a long-term solution. Most common diseases of our time include obesity, type 2 diabetes, hypertension, heart disease, and cancer. The Pritikin Program is recommended and has been proven to help reduce, reverse, and/or prevent the damaging effects of metabolic syndrome.  Nutrition   Overview of the Pritikin Eating Plan  Clinical staff conducted group or individual video education with verbal and written material and guidebook.  Patient learns about the [place] for disease risk reduction. The [place] emphasizes a wide variety of unrefined, minimally-processed carbohydrates, like fruits, vegetables, whole grains, and legumes. Go, Caution, and Stop food choices are explained. Plant-based and lean animal proteins are emphasized. Rationale provided for low sodium intake for blood pressure control, low added sugars for blood sugar stabilization, and low added fats and oils for coronary artery disease risk reduction and weight management.  Calorie Density  Clinical staff conducted group or individual video education with verbal and written material and guidebook.  Patient learns about calorie density and how it impacts the Pritikin Eating Plan. Knowing the characteristics of the food you choose will help you decide whether those foods will lead to weight gain or weight loss, and whether you want to consume more or less of them. Weight loss is usually a side effect of the Pritikin Eating Plan because of its focus on low calorie-dense foods.  Label Reading  Clinical staff conducted group or individual video education with verbal and written material and guidebook.  Patient learns about the Pritikin recommended label reading guidelines and corresponding recommendations  regarding calorie density, added sugars, sodium content, and whole grains.  Dining Out - Part 1  Clinical staff conducted group or individual video education with verbal and written material and guidebook.  Patient learns that restaurant meals can be sabotaging because they can be so high in calories, fat, sodium, and/or sugar. Patient learns recommended strategies on how to positively address this and avoid unhealthy pitfalls.  Facts on Fats  Clinical staff conducted group or individual video education with verbal and written material and guidebook.  Patient learns that  lifestyle modifications can be just as effective, if not more so, as many medications for lowering your risk of heart disease. A Pritikin lifestyle can help to reduce your risk of inflammation and atherosclerosis (cholesterol build-up, or plaque, in the artery walls). Lifestyle interventions such as dietary choices and physical activity address the cause of atherosclerosis. A review of the types of fats and their impact on blood cholesterol levels, along with dietary recommendations to reduce fat intake is also included.  Nutrition Action Plan  Clinical staff conducted group or individual video education with verbal and written material and guidebook.  Patient learns how to incorporate Pritikin recommendations into their lifestyle. Recommendations include planning and keeping personal health goals in mind as an important part of their success.  Healthy Mind-Set    Healthy Minds, Bodies, Hearts  Clinical staff conducted group or individual video education with verbal and written material and guidebook.  Patient learns how to identify when they are stressed. Video will discuss the impact of that stress, as well as the many benefits of stress management. Patient will also be introduced to stress management techniques. The way we think, act, and feel has an impact on our hearts.  How Our Thoughts Can Heal Our Hearts  Clinical staff  conducted group or individual video education with verbal and written material and guidebook.  Patient learns that negative thoughts can cause depression and anxiety. This can result in negative lifestyle behavior and serious health problems. Cognitive behavioral therapy is an effective method to help control our thoughts in order to change and improve our emotional outlook.  Additional Videos:  Exercise    Improving Performance  Clinical staff conducted group or individual video education with verbal and written material and guidebook.  Patient learns to use a non-linear approach by alternating intensity levels and lengths of time spent exercising to help burn more calories and lose more body fat. Cardiovascular exercise helps improve heart health, metabolism, hormonal balance, blood sugar control, and recovery from fatigue. Resistance training improves strength, endurance, balance, coordination, reaction time, metabolism, and muscle mass. Flexibility exercise improves circulation, posture, and balance. Seek guidance from your physician and exercise physiologist before implementing an exercise routine and learn your capabilities and proper form for all exercise.  Introduction to Yoga  Clinical staff conducted group or individual video education with verbal and written material and guidebook.  Patient learns about yoga, a discipline of the coming together of mind, breath, and body. The benefits of yoga include improved flexibility, improved range of motion, better posture and core strength, increased lung function, weight loss, and positive self-image. Yoga's heart health benefits include lowered blood pressure, healthier heart rate, decreased cholesterol and triglyceride levels, improved immune function, and reduced stress. Seek guidance from your physician and exercise physiologist before implementing an exercise routine and learn your capabilities and proper form for all exercise.  Medical   Aging:  Enhancing Your Quality of Life  Clinical staff conducted group or individual video education with verbal and written material and guidebook.  Patient learns key strategies and recommendations to stay in good physical health and enhance quality of life, such as prevention strategies, having an advocate, securing a [place], and keeping a list of medications and system for tracking them. It also discusses how to avoid risk for bone loss.  Biology of Weight Control  Clinical staff conducted group or individual video education with verbal and written material and guidebook.  Patient learns that weight gain occurs  because we consume more calories than we burn (eating more, moving less). Even if your body weight is normal, you may have higher ratios of fat compared to muscle mass. Too much body fat puts you at increased risk for cardiovascular disease, heart attack, stroke, type 2 diabetes, and obesity-related cancers. In addition to exercise, following the [place] can help reduce your risk.  Decoding Lab Results  Clinical staff conducted group or individual video education with verbal and written material and guidebook.  Patient learns that lab test reflects one measurement whose values change over time and are influenced by many factors, including medication, stress, sleep, exercise, food, hydration, pre-existing medical conditions, and more. It is recommended to use the knowledge from this video to become more involved with your lab results and evaluate your numbers to speak with your doctor.   Diseases of Our Time - Overview  Clinical staff conducted group or individual video education with verbal and written material and guidebook.  Patient learns that according to the CDC, 50% to 70% of chronic diseases (such as obesity, type 2 diabetes, elevated lipids, hypertension, and heart disease) are avoidable through lifestyle improvements including healthier food  choices, listening to satiety cues, and increased physical activity.  Sleep Disorders Clinical staff conducted group or individual video education with verbal and written material and guidebook.  Patient learns how good quality and duration of sleep are important to overall health and well-being. Patient also learns about sleep disorders and how they impact health along with recommendations to address them, including discussing with a physician.  Nutrition  Dining Out - Part 2 Clinical staff conducted group or individual video education with verbal and written material and guidebook.  Patient learns how to plan ahead and communicate in order to maximize their dining experience in a healthy and nutritious manner. Included are recommended food choices based on the type of restaurant the patient is visiting.   Fueling a Best boy conducted group or individual video education with verbal and written material and guidebook.  There is a strong connection between our food choices and our health. Diseases like obesity and type 2 diabetes are very prevalent and are in large-part due to lifestyle choices. The Pritikin Eating Plan provides plenty of food and hunger-curbing satisfaction. It is easy to follow, affordable, and helps reduce health risks.  Menu Workshop  Clinical staff conducted group or individual video education with verbal and written material and guidebook.  Patient learns that restaurant meals can sabotage health goals because they are often packed with calories, fat, sodium, and sugar. Recommendations include strategies to plan ahead and to communicate with the manager, chef, or server to help order a healthier meal.  Planning Your Eating Strategy  Clinical staff conducted group or individual video education with verbal and written material and guidebook.  Patient learns about the [place] and its benefit of reducing the risk of disease. The [place] does not focus on calories. Instead, it emphasizes high-quality, nutrient-rich foods. By knowing the characteristics of the foods, we choose, we can determine their calorie density and make informed decisions.  Targeting Your Nutrition Priorities  Clinical staff conducted group or individual video education with verbal and written material and guidebook.  Patient learns that lifestyle habits have a tremendous impact on disease risk and progression. This video provides eating and physical activity recommendations based on your personal health goals, such as reducing LDL cholesterol, losing weight, preventing or controlling type 2  diabetes, and reducing high blood pressure.  Vitamins and Minerals  Clinical staff conducted group or individual video education with verbal and written material and guidebook.  Patient learns different ways to obtain key vitamins and minerals, including through a recommended healthy diet. It is important to discuss all supplements you take with your doctor.   Healthy Mind-Set    Smoking Cessation  Clinical staff conducted group or individual video education with verbal and written material and guidebook.  Patient learns that cigarette smoking and tobacco addiction pose a serious health risk which affects millions of people. Stopping smoking will significantly reduce the risk of heart disease, lung disease, and many forms of cancer. Recommended strategies for quitting are covered, including working with your doctor to develop a successful plan.  Culinary   Becoming a Financial trader conducted group or individual video education with verbal and written material and guidebook.  Patient learns that cooking at home can be healthy, cost-effective, quick, and puts them in control. Keys to cooking healthy recipes will include looking at your recipe, assessing your equipment needs, planning ahead, making it simple, choosing cost-effective seasonal ingredients,  and limiting the use of added fats, salts, and sugars.  Cooking - Breakfast and Snacks  Clinical staff conducted group or individual video education with verbal and written material and guidebook.  Patient learns how important breakfast is to satiety and nutrition through the entire day. Recommendations include key foods to eat during breakfast to help stabilize blood sugar levels and to prevent overeating at meals later in the day. Planning ahead is also a key component.  Cooking - Human resources officer conducted group or individual video education with verbal and written material and guidebook.  Patient learns eating strategies to improve overall health, including an approach to cook more at home. Recommendations include thinking of animal protein as a side on your plate rather than center stage and focusing instead on lower calorie dense options like vegetables, fruits, whole grains, and plant-based proteins, such as beans. Making sauces in large quantities to freeze for later and leaving the skin on your vegetables are also recommended to maximize your experience.  Cooking - Healthy Salads and Dressing Clinical staff conducted group or individual video education with verbal and written material and guidebook.  Patient learns that vegetables, fruits, whole grains, and legumes are the foundations of the [place]. Recommendations include how to incorporate each of these in flavorful and healthy salads, and how to create homemade salad dressings. Proper handling of ingredients is also covered. Cooking - Soups and [employer] - Soups and Desserts Clinical staff conducted group or individual video education with verbal and written material and guidebook.  Patient learns that Pritikin soups and desserts make for easy, nutritious, and delicious snacks and meal components that are low in sodium, fat, sugar, and calorie density, while high in vitamins, minerals, and filling  fiber. Recommendations include simple and healthy ideas for soups and desserts.   Overview     The Pritikin Solution Program Overview Clinical staff conducted group or individual video education with verbal and written material and guidebook.  Patient learns that the results of the [place] Program have been documented in more than 100 articles published in peer-reviewed journals, and the benefits include reducing risk factors for (and, in some cases, even reversing) high cholesterol, high blood pressure, type 2 diabetes, obesity, and more! An overview of the three key pillars of the Pritikin Program will be  covered: eating well, doing regular exercise, and having a healthy mind-set.  WORKSHOPS  Exercise: Exercise Basics: Building Your Action Plan Clinical staff led group instruction and group discussion with PowerPoint presentation and patient guidebook. To enhance the learning environment the use of posters, models and videos may be added. At the conclusion of this workshop, patients will comprehend the difference between physical activity and exercise, as well as the benefits of incorporating both, into their routine. Patients will understand the FITT (Frequency, Intensity, Time, and Type) principle and how to use it to build an exercise action plan. In addition, safety concerns and other considerations for exercise and cardiac rehab will be addressed by the presenter. The purpose of this lesson is to promote a comprehensive and effective weekly exercise routine in order to improve patients' overall level of fitness.   Managing Heart Disease: Your Path to a Healthier Heart Clinical staff led group instruction and group discussion with PowerPoint presentation and patient guidebook. To enhance the learning environment the use of posters, models and videos may be added.At the conclusion of this workshop, patients will understand the anatomy and physiology of the heart. Additionally, they will  understand how Pritikin's three pillars impact the risk factors, the progression, and the management of heart disease.  The purpose of this lesson is to provide a high-level overview of the heart, heart disease, and how the Pritikin lifestyle positively impacts risk factors.  Exercise Biomechanics Clinical staff led group instruction and group discussion with PowerPoint presentation and patient guidebook. To enhance the learning environment the use of posters, models and videos may be added. Patients will learn how the structural parts of their bodies function and how these functions impact their daily activities, movement, and exercise. Patients will learn how to promote a neutral spine, learn how to manage pain, and identify ways to improve their physical movement in order to promote healthy living. The purpose of this lesson is to expose patients to common physical limitations that impact physical activity. Participants will learn practical ways to adapt and manage aches and pains, and to minimize their effect on regular exercise. Patients will learn how to maintain good posture while sitting, walking, and lifting.  Balance Training and Fall Prevention  Clinical staff led group instruction and group discussion with PowerPoint presentation and patient guidebook. To enhance the learning environment the use of posters, models and videos may be added. At the conclusion of this workshop, patients will understand the importance of their sensorimotor skills (vision, proprioception, and the vestibular system) in maintaining their ability to balance as they age. Patients will apply a variety of balancing exercises that are appropriate for their current level of function. Patients will understand the common causes for poor balance, possible solutions to these problems, and ways to modify their physical environment in order to minimize their fall risk. The purpose of this lesson is to teach patients  about the importance of maintaining balance as they age and ways to minimize their risk of falling.  WORKSHOPS   Nutrition:  Fueling a Scientist, research (physical sciences) led group instruction and group discussion with PowerPoint presentation and patient guidebook. To enhance the learning environment the use of posters, models and videos may be added. Patients will review the foundational principles of the [place] and understand what constitutes a serving size in each of the food groups. Patients will also learn Pritikin-friendly foods that are better choices when away from home and review make-ahead meal and snack options. Calorie density will  be reviewed and applied to three nutrition priorities: weight maintenance, weight loss, and weight gain. The purpose of this lesson is to reinforce (in a group setting) the key concepts around what patients are recommended to eat and how to apply these guidelines when away from home by planning and selecting Pritikin-friendly options. Patients will understand how calorie density may be adjusted for different weight management goals.  Mindful Eating  Clinical staff led group instruction and group discussion with PowerPoint presentation and patient guidebook. To enhance the learning environment the use of posters, models and videos may be added. Patients will briefly review the concepts of the [place] and the importance of low-calorie dense foods. The concept of mindful eating will be introduced as well as the importance of paying attention to internal hunger signals. Triggers for non-hunger eating and techniques for dealing with triggers will be explored. The purpose of this lesson is to provide patients with the opportunity to review the basic principles of the [place], discuss the value of eating mindfully and how to measure internal cues of hunger and fullness using the Hunger Scale. Patients will also discuss reasons for non-hunger  eating and learn strategies to use for controlling emotional eating.  Targeting Your Nutrition Priorities Clinical staff led group instruction and group discussion with PowerPoint presentation and patient guidebook. To enhance the learning environment the use of posters, models and videos may be added. Patients will learn how to determine their genetic susceptibility to disease by reviewing their family history. Patients will gain insight into the importance of diet as part of an overall healthy lifestyle in mitigating the impact of genetics and other environmental insults. The purpose of this lesson is to provide patients with the opportunity to assess their personal nutrition priorities by looking at their family history, their own health history and current risk factors. Patients will also be able to discuss ways of prioritizing and modifying the [place] for their highest risk areas  Menu  Clinical staff led group instruction and group discussion with PowerPoint presentation and patient guidebook. To enhance the learning environment the use of posters, models and videos may be added. Using menus brought in from [employer], or printed from [employer], patients will apply the [place] dining out guidelines that were presented in the [employer] video. Patients will also be able to practice these guidelines in a variety of provided scenarios. The purpose of this lesson is to provide patients with the opportunity to practice hands-on learning of the [place] with actual menus and practice scenarios.  Label Reading Clinical staff led group instruction and group discussion with PowerPoint presentation and patient guidebook. To enhance the learning environment the use of posters, models and videos may be added. Patients will review and discuss the Pritikin label reading guidelines presented in Pritikin's Label Reading Educational series video.  Using fool labels brought in from local grocery stores and markets, patients will apply the label reading guidelines and determine if the packaged food meet the Pritikin guidelines. The purpose of this lesson is to provide patients with the opportunity to review, discuss, and practice hands-on learning of the Pritikin Label Reading guidelines with actual packaged food labels. [place] Workshops are designed to teach patients ways to prepare quick, simple, and affordable recipes at home. The importance of nutrition's role in chronic disease risk reduction is reflected in its emphasis in the overall Pritikin program. By learning how to  prepare essential core Pritikin Eating Plan recipes, patients will increase control over what they eat; be able to customize the flavor of foods without the use of added salt, sugar, or fat; and improve the quality of the food they consume. By learning a set of core recipes which are easily assembled, quickly prepared, and affordable, patients are more likely to prepare more healthy foods at home. These workshops focus on convenient breakfasts, simple entres, side dishes, and desserts which can be prepared with minimal effort and are consistent with nutrition recommendations for cardiovascular risk reduction. Cooking [employer] are taught by a Engineer, materials (RD) who has been trained by the [employer]. The chef or RD has a clear understanding of the importance of minimizing - if not completely eliminating - added fat, sugar, and sodium in recipes. Throughout the series of [place] Workshop sessions, patients will learn about healthy ingredients and efficient methods of cooking to build confidence in their capability to prepare    Cooking School weekly topics:  Adding Flavor- Sodium-Free  Fast and Healthy Breakfasts  Powerhouse Plant-Based Proteins  Satisfying Salads and Dressings  Simple Sides and  Sauces  International Cuisine-Spotlight on the [place] Zones  Delicious Desserts  Savory Soups  Efficiency Cooking - Meals in a Snap  Tasty Appetizers and Snacks  Comforting Weekend Breakfasts  One-Pot Wonders   Fast Evening Meals  Easy [place] (Psychosocial): New Thoughts, New Behaviors Clinical staff led group instruction and group discussion with PowerPoint presentation and patient guidebook. To enhance the learning environment the use of posters, models and videos may be added. Patients will learn and practice techniques for developing effective health and lifestyle goals. Patients will be able to effectively apply the goal setting process learned to develop at least one new personal goal.  The purpose of this lesson is to expose patients to a new skill set of behavior modification techniques such as techniques setting SMART goals, overcoming barriers, and achieving new thoughts and new behaviors.  Managing Moods and Relationships Clinical staff led group instruction and group discussion with PowerPoint presentation and patient guidebook. To enhance the learning environment the use of posters, models and videos may be added. Patients will learn how emotional and chronic stress factors can impact their health and relationships. They will learn healthy ways to manage their moods and utilize positive coping mechanisms. In addition, ICR patients will learn ways to improve communication skills. The purpose of this lesson is to expose patients to ways of understanding how one's mood and health are intimately connected. Developing a healthy outlook can help build positive relationships and connections with others. Patients will understand the importance of utilizing effective communication skills that include actively listening and being heard. They will learn and understand the importance of the "4 Cs" and especially Connections in  fostering of a Healthy Mind-Set.  Healthy Sleep for a Healthy Heart Clinical staff led group instruction and group discussion with PowerPoint presentation and patient guidebook. To enhance the learning environment the use of posters, models and videos may be added. At the conclusion of this workshop, patients will be able to demonstrate knowledge of the importance of sleep to overall health, well-being, and quality of life. They will understand the symptoms of, and treatments for, common sleep disorders. Patients will also be able to identify daytime and nighttime behaviors which impact sleep, and they will be able to apply these tools to help  manage sleep-related challenges. The purpose of this lesson is to provide patients with a general overview of sleep and outline the importance of quality sleep. Patients will learn about a few of the most common sleep disorders. Patients will also be introduced to the concept of "sleep hygiene," and discover ways to self-manage certain sleeping problems through simple daily behavior changes. Finally, the workshop will motivate patients by clarifying the links between quality sleep and their goals of heart-healthy living.   Recognizing and Reducing Stress Clinical staff led group instruction and group discussion with PowerPoint presentation and patient guidebook. To enhance the learning environment the use of posters, models and videos may be added. At the conclusion of this workshop, patients will be able to understand the types of stress reactions, differentiate between acute and chronic stress, and recognize the impact that chronic stress has on their health. They will also be able to apply different coping mechanisms, such as reframing negative self-talk. Patients will have the opportunity to practice a variety of stress management techniques, such as deep abdominal breathing, progressive muscle relaxation, and/or guided imagery.  The purpose of this lesson is to  educate patients on the role of stress in their lives and to provide healthy techniques for coping with it.  Learning Barriers/Preferences:   Education Topics:  Knowledge Questionnaire Score:   Core Components/Risk Factors/Patient Goals at Admission:   Core Components/Risk Factors/Patient Goals Review:   Goals and Risk Factor Review     Row Name 12/05/21 1617 12/05/21 1619           Core Components/Risk Factors/Patient Goals Review   Personal Goals Review Weight Management/Obesity;Hypertension;Lipids;Other Weight Management/Obesity;Hypertension;Lipids;Other      Review Timothy Munoz was off to a great start with cardiac rehab with the completion of 7 exercise sessions Timothy Munoz was off to a great start with cardiac rehab with the completion of 7 exercise sessions and 1 education class until he was placed on medical hold due to syncopal fall he had at home. Continued work up pending clearance to return to CR.  Unable to assess weight goal as there are discrepancies with the data.  Timothy Munoz is compliant with his medications and statin therapy. Timothy Munoz desires to improve his flexibility along wtih upper body strength.  Hopeful he will continue to work on these areas of needs when he is able to resume cardiac rehab.      Expected Outcomes -- Timothy Munoz will adopt a heart healthy lifestyle in accordance to the three pillars for Pritikin Intensive cardiac rehab: Exercise, heart healthy nutrtitionand healthy mind set.               Core Components/Risk Factors/Patient Goals at Discharge (Final Review):   Goals and Risk Factor Review - 12/05/21 1619       Core Components/Risk Factors/Patient Goals Review   Personal Goals Review Weight Management/Obesity;Hypertension;Lipids;Other    Review Timothy Munoz was off to a great start with cardiac rehab with the completion of 7 exercise sessions and 1 education class until he was placed on medical hold due to syncopal fall he had at home. Continued work up pending clearance  to return to CR.  Unable to assess weight goal as there are discrepancies with the data.  Timothy Munoz is compliant with his medications and statin therapy. Timothy Munoz desires to improve his flexibility along wtih upper body strength.  Hopeful he will continue to work on these areas of needs when he is able to resume cardiac rehab.    Expected Outcomes Timothy Munoz will adopt  a heart healthy lifestyle in accordance to the three pillars for Pritikin Intensive cardiac rehab: Exercise, heart healthy nutrtitionand healthy mind set.             ITP Comments:  ITP Comments     Row Name 12/06/21 [phone] 01/03/22 1340         ITP Comments 30 day ITP Review pt presently on medical hold pending Syncopal episode work up 30 day ITP Review pt remains on hold, has cardiology follow up on 8/28               Comments: Timothy Munoz remains on medical hold pending cardiology clearance.  Follow up appt on 8/28. Cherre Huger, BSN Cardiac and Training and development officer

## 2022-01-05 ENCOUNTER — Other Ambulatory Visit: Payer: Self-pay | Admitting: Physician Assistant

## 2022-01-08 ENCOUNTER — Encounter: Payer: Self-pay | Admitting: Physician Assistant

## 2022-01-08 ENCOUNTER — Ambulatory Visit: Payer: Medicare HMO | Attending: Physician Assistant | Admitting: Physician Assistant

## 2022-01-08 VITALS — BP 118/70 | HR 52 | Ht 74.0 in | Wt 217.2 lb

## 2022-01-08 DIAGNOSIS — I480 Paroxysmal atrial fibrillation: Secondary | ICD-10-CM

## 2022-01-08 DIAGNOSIS — I25810 Atherosclerosis of coronary artery bypass graft(s) without angina pectoris: Secondary | ICD-10-CM | POA: Diagnosis not present

## 2022-01-08 DIAGNOSIS — I340 Nonrheumatic mitral (valve) insufficiency: Secondary | ICD-10-CM

## 2022-01-08 DIAGNOSIS — E785 Hyperlipidemia, unspecified: Secondary | ICD-10-CM | POA: Diagnosis not present

## 2022-01-08 DIAGNOSIS — I10 Essential (primary) hypertension: Secondary | ICD-10-CM

## 2022-01-08 MED ORDER — SPIRONOLACTONE 25 MG PO TABS: ORAL_TABLET | ORAL | 0 refills | Status: DC

## 2022-01-08 NOTE — Progress Notes (Signed)
Cardiology Office Note:    Date:  01/10/2022   ID:  Timothy Munoz, DOB 16-Feb-1946, MRN [id number]  PCP:  [name], MD   [facility] Providers Cardiologist:  [name], MD     Referring MD: [name], MD   Chief Complaint  Patient presents with   Follow-up    Seen for Dr. [name]    History of Present Illness:    Timothy Munoz is a 76 y.o. male with a hx of CAD, HTN, HLD and history of essential tremor.  Patient had anterolateral STEMI in September 2022 and underwent balloon angioplasty of ostial LAD.  Hospital course was complicated by cardiogenic shock with reduced EF of 35%.  He required intra-aortic balloon pump and ultimately underwent CABG x3 on 01/20/2021 by Dr. Dorris Fetch.  Postprocedure, he did develop postop atrial fibrillation requiring amiodarone and was treated with digoxin.  Echocardiogram obtained on 01/25/2021 showed EF has improved to 40 to 45%, grade 2 DD.  He was treated with Lasix for volume overload.  On follow-up in January, Lasix was changed to PRN.  Repeat echocardiogram in January 2023 showed EF 35 to 40%, PASP 53.4 mmHg, moderate to severe MR.  He was started on Farxiga with subsequent dose reduction due to creatinine elevation.  I last saw the patient in April and transitioned his ARB to [facility].  He was admitted in May 2023 due to volume overload and underwent IV diuresis.  Echocardiogram showed further reduction in LVEF at 26% on 09/18/2021.  He was in atrial flutter and underwent TEE guided DCCV.  Spironolactone was added on 5/27.  proBNP at the time was 3278.  Since his last visit with Dr. [name], patient has been seen by Dr. [name] on 10/06/2021, given a single episode of atrial flutter, it was recommended to continue monitoring.  If further episode occurs in the future, Dr. [name] would recommend A-flutter ablation.   I last saw the patient on 11/06/2021 at which time he denied any chest pain or worsening dyspnea.  His renal function was  stable on the recent blood work.  His only complaint was he was getting up multiple times in a single night to go to the bathroom to urinate.  He feels like he is not emptying his bladder.  I added Flomax to his medical regimen.  I last saw the patient on 7/13, 2 days before that, she had a episode of brief syncope.  Blood work showed stable renal function, potassium 5.5.  Given no sign of dehydration, I recommended a 2-week heart monitor.  Echocardiogram obtained on 01/03/2022 showed EF 35 to 40%, moderate to severe MR.  Patient presents today for follow-up.  He we have reviewed his recent heart monitor, there was no significant arrhythmia to explain the recent syncope.  There were 2 episodes of nonsustained VT, however the longest episode was only 7 beats.  There were 8 brief episodes of SVT.  He denies any further passing out spell.  He has no chest pain or worsening dyspnea.  I did discuss with Dr. [name] regarding the recent echocardiogram result which showed worsening mitral valve regurgitation.  Dr. [name] recommended adding spironolactone every third day and repeat echocardiogram in 6 months.   Past Medical History:  Diagnosis Date   Coronary artery disease    Hyperlipidemia    mild    Hypertension    Knee pain    Obesity    Tremor    Tremor, essential 03/04/2019  Past Surgical History:  Procedure Laterality Date   APPENDECTOMY     BACK SURGERY     CARDIAC CATHETERIZATION     CARDIOVERSION N/A 09/20/2021   Procedure: CARDIOVERSION;  Surgeon: Jerline Pain, MD;  Location: [place] [facility]COPY;  Service: Cardiovascular;  Laterality: N/A;   CORONARY ARTERY BYPASS GRAFT N/A 01/20/2021   Procedure: CORONARY ARTERY BYPASS GRAFTING (CABG) X 3 ON PUMP, USING LEFT INTERNAL MAMMARY ARTERY AND LEFT ENDOSCOPIC GREATER SAPHENOUS VEIN HARVEST CONDUITS;  Surgeon: Melrose Nakayama, MD;  Location: [place];  Service: Open Heart Surgery;  Laterality: N/A;   CORONARY/GRAFT ACUTE MI REVASCULARIZATION N/A  01/16/2021   Procedure: Coronary/Graft Acute MI Revascularization;  Surgeon: Troy Sine, MD;  Location: [place] CV LAB;  Service: Cardiovascular;  Laterality: N/A;   ENDOVEIN HARVEST OF GREATER SAPHENOUS VEIN Left 01/20/2021   Procedure: ENDOVEIN HARVEST OF GREATER SAPHENOUS VEIN;  Surgeon: Melrose Nakayama, MD;  Location: [place];  Service: Open Heart Surgery;  Laterality: Left;   HEMORRHOID SURGERY     IABP INSERTION N/A 01/16/2021   Procedure: IABP Insertion;  Surgeon: Troy Sine, MD;  Location: [place] CV [facility];  Service: Cardiovascular;  Laterality: N/A;   LEFT HEART CATH AND CORONARY ANGIOGRAPHY N/A 01/16/2021   Procedure: LEFT HEART CATH AND CORONARY ANGIOGRAPHY;  Surgeon: Troy Sine, MD;  Location: [place] CV LAB;  Service: Cardiovascular;  Laterality: N/A;   TEE WITHOUT CARDIOVERSION N/A 01/20/2021   Procedure: TRANSESOPHAGEAL ECHOCARDIOGRAM (TEE);  Surgeon: Melrose Nakayama, MD;  Location: [place];  Service: Open Heart Surgery;  Laterality: N/A;   TEE WITHOUT CARDIOVERSION N/A 09/20/2021   Procedure: TRANSESOPHAGEAL ECHOCARDIOGRAM (TEE);  Surgeon: Jerline Pain, MD;  Location: [facility] ENDOSCOPY;  Service: Cardiovascular;  Laterality: N/A;    Current Medications: Current Meds  Medication Sig   apixaban (ELIQUIS) 5 MG TABS tablet Take 1 tablet (5 mg total) by mouth 2 (two) times daily.   carvedilol (COREG) 6.25 MG tablet Take 1 tablet (6.25 mg total) by mouth 2 (two) times daily with a meal.   clopidogrel (PLAVIX) 75 MG tablet Take 1 tablet (75 mg total) by mouth daily.   dapagliflozin propanediol (FARXIGA) 5 MG TABS tablet Take 5 mg by mouth daily.   primidone (MYSOLINE) 50 MG tablet Take 1-2 tablets daily for tremor (Patient taking differently: Take 100 mg by mouth daily.)   rosuvastatin (CRESTOR) 20 MG tablet Take 1 tablet (20 mg total) by mouth daily.   sacubitril-valsartan (ENTRESTO) 24-26 MG Take 1 tablet by mouth 2 (two) times daily.   spironolactone  (ALDACTONE) 25 MG tablet Take 0.5 tablet every THIRD day     Allergies:   Patient has no known allergies.   Social History   Socioeconomic History   Marital status: Single    Spouse name: Not on file   Number of children: 2   Years of education: 13   Highest education level: High school graduate  Occupational History   Occupation: retired    Comment: Clinical cytogeneticist  Tobacco Use   Smoking status: Former   Smokeless tobacco: Former    Types: Chew  Substance and Sexual Activity   Alcohol use: Yes    Comment: one beer every two months   Drug use: No   Sexual activity: Not on file  Other Topics Concern   Not on file  Social History Narrative   Right handed    Social Determinants of Health   Financial Resource Strain: Not on file  Food Insecurity: Not on file  Transportation Needs: Not on file  Physical Activity: Not on file  Stress: Not on file  Social Connections: Not on file     Family History: The patient's family history includes Alzheimer's disease in his mother; Hypertension in his father and mother; Parkinson's disease in his mother; Suicidality in his father.  ROS:   Please see the history of present illness.     All other systems reviewed and are negative.  EKGs/Labs/Other Studies Reviewed:    The following studies were reviewed today:  Echo 01/03/2022  1. Global hypokinesis worse in the septum, apex and inferior base. Left  ventricular ejection fraction, by estimation, is 35 to 40%. The left  ventricle has moderately decreased function. The left ventricle has no  regional wall motion abnormalities. The  left ventricular internal cavity size was moderately dilated.   2. Right ventricular systolic function is normal. The right ventricular  size is normal.   3. Left atrial size was severely dilated.   4. No subcostal images.   5. Consider TEE to further asses degree of MR Likely ischemic with  restricted posterior leaflet and atrial FMR as well . The  mitral valve is  normal in structure. Moderate to severe mitral valve regurgitation. No  evidence of mitral stenosis.   6. Tricuspid valve regurgitation is moderate.   7. The aortic valve is normal in structure. Aortic valve regurgitation is  not visualized. No aortic stenosis is present.   8. Aortic dilatation noted. There is mild dilatation of the aortic root,  measuring 41 mm.   9. The inferior vena cava is normal in size with greater than 50%  respiratory variability, suggesting right atrial pressure of 3 mmHg.   EKG:  EKG is not ordered today.    Recent Labs: 09/17/2021: B Natriuretic Peptide 824.1; Hemoglobin 11.6; Platelets 303; TSH 5.397 09/18/2021: Magnesium 2.2 09/27/2021: ALT 21; NT-Pro BNP 3,278 12/04/2021: BUN 32; Creatinine, Ser 1.24; Potassium 5.0; Sodium 136  Recent Lipid Panel    Component Value Date/Time   CHOL 163 01/16/2021 0739   TRIG 33 01/16/2021 0739   HDL 42 01/16/2021 0739   CHOLHDL 3.9 01/16/2021 0739   VLDL 7 01/16/2021 0739   LDLCALC 114 (H) 01/16/2021 0739     Risk Assessment/Calculations:    CHA2DS2-VASc Score = 5   This indicates a 7.2% annual risk of stroke. The patient's score is based upon: CHF History: 1 HTN History: 1 Diabetes History: 0 Stroke History: 0 Vascular Disease History: 1 Age Score: 2 Gender Score: 0          Physical Exam:    VS:  BP 118/70   Pulse (!) 52   Ht 6\' 2"  (1.88 m)   Wt 217 lb 3.2 oz (98.5 kg)   SpO2 98%   BMI 27.89 kg/m        Wt Readings from Last 3 Encounters:  01/08/22 217 lb 3.2 oz (98.5 kg)  11/23/21 208 lb 9.6 oz (94.6 kg)  11/06/21 212 lb 12.8 oz (96.5 kg)     GEN:  Well nourished, well developed in no acute distress HEENT: Normal NECK: No JVD; No carotid bruits LYMPHATICS: No lymphadenopathy CARDIAC: RRR, no murmurs, rubs, gallops RESPIRATORY:  Clear to auscultation without rales, wheezing or rhonchi  ABDOMEN: Soft, non-tender, non-distended MUSCULOSKELETAL:  No edema; No deformity   SKIN: Warm and dry NEUROLOGIC:  Alert and oriented x 3 PSYCHIATRIC:  Normal affect   ASSESSMENT:    1.  Mitral valve insufficiency, unspecified etiology   2. Coronary artery disease involving coronary bypass graft of native heart without angina pectoris   3. Primary hypertension   4. Hyperlipidemia LDL goal <70   5. PAF (paroxysmal atrial fibrillation) (HCC)    PLAN:    In order of problems listed above:  Mitral valve insufficiency: The case was discussed with MD, we will repeat echocardiogram in 6 months.  Added spironolactone 12.5 mg every third day.  CAD s/p CABG: Denies any recent chest pain.  Hypertension: Blood pressure stable  Hyperlipidemia: Continue Crestor  PAF: On Eliquis and carvedilol           Medication Adjustments/Labs and Tests Ordered: Current medicines are reviewed at length with the patient today.  Concerns regarding medicines are outlined above.  Orders Placed This Encounter  Procedures   ECHOCARDIOGRAM COMPLETE   Meds ordered this encounter  Medications   spironolactone (ALDACTONE) 25 MG tablet    Sig: Take 0.5 tablet every THIRD day    Dispense:  45 tablet    Refill:  0    Patient Instructions  Medication Instructions:  Your physician has recommended you make the following change in your medication START: Spirolactone 12.5mg  (half tablet) every THIRD day.   *If you need a refill on your cardiac medications before your next appointment, please call your pharmacy*   Lab Work: NONE If you have labs (blood work) drawn today and your tests are completely normal, you will receive your results only by: MyChart Message (if you have MyChart) OR A paper copy in the mail If you have any lab test that is abnormal or we need to change your treatment, we will call you to review the results.   Testing/Procedures: Your physician has requested that you have an echocardiogram. Echocardiography is a painless test that uses sound waves to create images  of your heart. It provides your doctor with information about the size and shape of your heart and how well your heart's chambers and valves are working. This procedure takes approximately one hour. There are no restrictions for this procedure.    Follow-Up: At [facility], you and your health needs are our priority.  As part of our continuing mission to provide you with exceptional heart care, we have created designated Provider Care Teams.  These Care Teams include your primary Cardiologist (physician) and Advanced Practice Providers (APPs -  Physician Assistants and Nurse Practitioners) who all work together to provide you with the care you need, when you need it.  We recommend signing up for the patient portal called "MyChart".  Sign up information is provided on this After Visit Summary.  MyChart is used to connect with patients for Virtual Visits (Telemedicine).  Patients are able to view lab/test results, encounter notes, upcoming appointments, etc.  Non-urgent messages can be sent to your provider as well.   To learn more about what you can do with MyChart, go to [email or web address].     Other Instructions Please keep upcoming appointment with Dr. [name]  Important Information About Sugar         Signed, Azalee Course, PA  01/10/2022 4:19 PM    [facility]

## 2022-01-08 NOTE — Patient Instructions (Signed)
Medication Instructions:  Your physician has recommended you make the following change in your medication START: Spirolactone 12.5mg  (half tablet) every THIRD day.   *If you need a refill on your cardiac medications before your next appointment, please call your pharmacy*   Lab Work: NONE If you have labs (blood work) drawn today and your tests are completely normal, you will receive your results only by: MyChart Message (if you have MyChart) OR A paper copy in the mail If you have any lab test that is abnormal or we need to change your treatment, we will call you to review the results.   Testing/Procedures: Your physician has requested that you have an echocardiogram. Echocardiography is a painless test that uses sound waves to create images of your heart. It provides your doctor with information about the size and shape of your heart and how well your heart's chambers and valves are working. This procedure takes approximately one hour. There are no restrictions for this procedure.    Follow-Up: At [facility], you and your health needs are our priority.  As part of our continuing mission to provide you with exceptional heart care, we have created designated Provider Care Teams.  These Care Teams include your primary Cardiologist (physician) and Advanced Practice Providers (APPs -  Physician Assistants and Nurse Practitioners) who all work together to provide you with the care you need, when you need it.  We recommend signing up for the patient portal called "MyChart".  Sign up information is provided on this After Visit Summary.  MyChart is used to connect with patients for Virtual Visits (Telemedicine).  Patients are able to view lab/test results, encounter notes, upcoming appointments, etc.  Non-urgent messages can be sent to your provider as well.   To learn more about what you can do with MyChart, go to [email or web address].     Other Instructions Please keep upcoming  appointment with Dr. [name]  Important Information About Sugar

## 2022-01-10 ENCOUNTER — Ambulatory Visit (HOSPITAL_COMMUNITY): Payer: Medicare HMO

## 2022-01-10 ENCOUNTER — Encounter: Payer: Self-pay | Admitting: Physician Assistant

## 2022-01-12 ENCOUNTER — Telehealth (HOSPITAL_COMMUNITY): Payer: Self-pay | Admitting: *Deleted

## 2022-01-12 ENCOUNTER — Ambulatory Visit (HOSPITAL_COMMUNITY): Payer: Medicare HMO

## 2022-01-12 NOTE — Telephone Encounter (Signed)
-----   Message from Thurmon Fair, MD sent at 01/12/2022 10:51 AM EDT ----- Regarding: RE: Ok to return to cardiac rehab Reviewed chart for Dr. [name]. He can return to rehab. ----- Message ----- From: Chelsea Aus, RN Sent: 01/12/2022   7:40 AM EDT To: Lennette Bihari, MD; Azalee Course, PA Subject: Rip Harbour to return to cardiac rehab                    Currently on hold from cardiac rehab pending syncopal episode work up.  Pt seen in follow up on 8/28.  Based upon your assessment, ok for Gery Pray to return to exercise?  Thanks Pharmacist, hospital, BSN Cardiac and Emergency planning/management officer

## 2022-01-12 NOTE — Telephone Encounter (Signed)
Called and spoke to [place] regarding returning to cardiac rehab now that he has been cleared.  Gery Pray declines returning to rehab.  He is consistently exercising with walking several miles daily and with the distance to travel to [place] feels he will be fine exercising on his home. Plan to discharge him from Cardiac rehab. Alanson Aly, BSN Cardiac and Emergency planning/management officer

## 2022-01-17 ENCOUNTER — Ambulatory Visit (HOSPITAL_COMMUNITY): Payer: Medicare HMO

## 2022-01-18 NOTE — Progress Notes (Signed)
Discharge Progress Report  Patient Details  Name: Timothy Munoz MRN: [id number] Date of Birth: Oct 25, 1945 Referring Provider:   Flowsheet Row INTENSIVE CARDIAC REHAB ORIENT from 11/02/2021 in [place]  Referring Provider Dr. Shelva Majestic MD        Number of Visits: 7  Reason for Discharge:  Early Exit:  Timothy Munoz received clearance to return to cardiac rehab after the completion of syncopal episode work up.  Timothy Munoz opted not to return and to continue exercising on his own.   Smoking History:  Social History   Tobacco Use  Smoking Status Former  Smokeless Tobacco Former   Types: Chew    Diagnosis:  01/16/21 STEMI  01/20/21 S/P CABG x 3  ADL [facility]:   Initial Exercise Prescription:   Discharge Exercise Prescription (Final Exercise Prescription Changes):  Exercise Prescription Changes - 11/08/21 0825       Response to Exercise   Blood Pressure (Admit) 120/64    Blood Pressure (Exercise) 134/82    Blood Pressure (Exit) 102/60    Heart Rate (Admit) 70 bpm    Heart Rate (Exercise) 105 bpm    Heart Rate (Exit) 70 bpm    Rating of Perceived Exertion (Exercise) 11    Perceived Dyspnea (Exercise) 0    Symptoms 0    Comments Pt first day in the CRP2 program    Duration Progress to 30 minutes of  aerobic without signs/symptoms of physical distress    Intensity THRR unchanged      Progression   Progression Continue to progress workloads to maintain intensity without signs/symptoms of physical distress.    Average METs 2.35      Resistance Training   Training Prescription No      NuStep   Level 2    SPM 80    Minutes 15    METs 2.6      Arm Ergometer   Level 1.5    RPM 85    Minutes 15    METs 2             Functional Capacity:   Psychological, QOL, Others - Outcomes: PHQ 2/9:    11/02/2021    3:12 PM  Depression screen PHQ 2/9  Decreased Interest 0  Down, Depressed, Hopeless 0  PHQ - 2 Score 0    Quality of  Life:   Personal Goals: Goals established at orientation with interventions provided to work toward goal.    Personal Goals Discharge:  Goals and Risk Factor Review     Row Name 12/05/21 1617 12/05/21 1619 01/03/22 1343         Core Components/Risk Factors/Patient Goals Review   Personal Goals Review Weight Management/Obesity;Hypertension;Lipids;Other Weight Management/Obesity;Hypertension;Lipids;Other Weight Management/Obesity;Hypertension;Lipids;Other     Review Timothy Munoz was off to a great start with cardiac rehab with the completion of 7 exercise sessions Timothy Munoz was off to a great start with cardiac rehab with the completion of 7 exercise sessions and 1 education class until he was placed on medical hold due to syncopal fall he had at home. Continued work up pending clearance to return to CR.  Unable to assess weight goal as there are discrepancies with the data.  Timothy Munoz is compliant with his medications and statin therapy. Timothy Munoz desires to improve his flexibility along wtih upper body strength.  Hopeful he will continue to work on these areas of needs when he is able to resume cardiac rehab. Timothy Munoz remains on hold from exercise pending cardiac clearance.  Pt has upcoming appt with cardiology on 8/28 - hopeful he will be able to resume     Expected Outcomes -- Timothy Munoz will adopt a heart healthy lifestyle in accordance to the three pillars for [facility] Intensive cardiac rehab: Exercise, heart healthy nutrtitionand healthy mind set. Timothy Munoz will adopt a heart healthy lifestyle in accordance to the three pillars for [facility] Intensive cardiac rehab: Exercise, heart healthy nutrtitionand healthy mind set.              Exercise Goals and Review:   Exercise Goals Re-Evaluation:  Exercise Goals Re-Evaluation     Row Name 11/08/21 0829             Exercise Goal Re-Evaluation   Exercise Goals Review Increase Physical Activity;Increase Strength and Stamina;Able to understand and use rate of  perceived exertion (RPE) scale;Knowledge and understanding of Target Heart Rate Range (THRR);Understanding of Exercise Prescription       Comments Pt first day in the CRP2 program. Pt tolerated exercise well with an average MET level of 2.35. Pt is learning his THRR, RPE, and ExRx.       Expected Outcomes Will continue to monitor pt and progress workloads as tolerated without sign or symptom                Nutrition & Weight - Outcomes:    Nutrition:  Nutrition Therapy & Goals - 12/27/21 1040       Nutrition Therapy   Diet Heart healthy diet    Drug/Food Interactions Statins/Certain Fruits      Personal Nutrition Goals   Nutrition Goal Patient to choose a daily variety of fruits, vegetables, whole grains, lean protein/plant protein, nonfat dairy as part of a heart healthy lifestyle    Personal Goal #2 Patient to limit to <1552m of sodium daily.    Personal Goal #3 Patient to identify and limit daily intake of sodium, trans fats, saturated fat, and refined carbohydrates    Comments Timothy Chouhas not attended cardiac rehab since 7/12 due to syncope event. He continues follow-up with cardiology. Patient declines [facility] education classes to aid with nutrition support.      Intervention Plan   Intervention Prescribe, educate and counsel regarding individualized specific dietary modifications aiming towards targeted core components such as weight, hypertension, lipid management, diabetes, heart failure and other comorbidities.    Expected Outcomes Short Term Goal: Understand basic principles of dietary content, such as calories, fat, sodium, cholesterol and nutrients.;Long Term Goal: Adherence to prescribed nutrition plan.             Nutrition Discharge:   Education Questionnaire Score:   Post assessment not completed as Timothy Munoz not return to exercise.  Plans to continue exercise with walking 2-3 miles daily. CCherre Huger BSN Cardiac and PTEFL teacher

## 2022-01-19 ENCOUNTER — Ambulatory Visit (HOSPITAL_COMMUNITY): Payer: Medicare HMO

## 2022-01-22 ENCOUNTER — Ambulatory Visit (HOSPITAL_COMMUNITY): Payer: Medicare HMO

## 2022-01-24 ENCOUNTER — Ambulatory Visit (HOSPITAL_COMMUNITY): Payer: Medicare HMO

## 2022-01-24 ENCOUNTER — Other Ambulatory Visit: Payer: Self-pay | Admitting: Cardiovascular Disease

## 2022-01-24 ENCOUNTER — Telehealth: Payer: Self-pay | Admitting: Cardiovascular Disease

## 2022-01-24 MED ORDER — CARVEDILOL 6.25 MG PO TABS: 6.2500 mg | ORAL_TABLET | Freq: Two times a day (BID) | ORAL | 1 refills | Status: DC

## 2022-01-24 NOTE — Telephone Encounter (Signed)
*  STAT* If patient is at the pharmacy, call can be transferred to refill team.   1. Which medications need to be refilled? (please list name of each medication and dose if known)   carvedilol (COREG) 6.25 MG tablet    2. Which pharmacy/location (including street and city if local pharmacy) is medication to be sent to? [facility] - [place], [place] - [address]  3. Do they need a 30 day or 90 day supply?  90 day

## 2022-01-25 DIAGNOSIS — H52223 Regular astigmatism, bilateral: Secondary | ICD-10-CM | POA: Diagnosis not present

## 2022-01-25 DIAGNOSIS — H5203 Hypermetropia, bilateral: Secondary | ICD-10-CM | POA: Diagnosis not present

## 2022-01-25 DIAGNOSIS — H524 Presbyopia: Secondary | ICD-10-CM | POA: Diagnosis not present

## 2022-01-26 ENCOUNTER — Ambulatory Visit (HOSPITAL_COMMUNITY): Payer: Medicare HMO

## 2022-01-29 ENCOUNTER — Ambulatory Visit (HOSPITAL_COMMUNITY): Payer: Medicare HMO

## 2022-01-29 DIAGNOSIS — H02834 Dermatochalasis of left upper eyelid: Secondary | ICD-10-CM | POA: Diagnosis not present

## 2022-01-29 DIAGNOSIS — H02831 Dermatochalasis of right upper eyelid: Secondary | ICD-10-CM | POA: Diagnosis not present

## 2022-01-30 ENCOUNTER — Telehealth: Payer: Self-pay

## 2022-01-30 NOTE — Telephone Encounter (Signed)
   Pre-operative Risk Assessment    Patient Name: Timothy Munoz  DOB: 09-06-1945 MRN: [id number]      Request for Surgical Clearance    Procedure:   Ectropion Repair  Date of Surgery:  Clearance 02/23/22                                 Surgeon:  Dr. Linward Headland Surgeon's Group or Practice Name:  [facility] Phone number:  [phone] Fax number:  [phone]   Type of Clearance Requested:   - Pharmacy:  Hold Apixaban (Eliquis) 3   Type of Anesthesia:  MAC   Additional requests/questions:   Patient should hold Eliquis for 2 days post-op.  Signed, Elsie Lincoln Rome Schlauch   01/30/2022, 9:50 AM

## 2022-01-31 ENCOUNTER — Ambulatory Visit (HOSPITAL_COMMUNITY): Payer: Medicare HMO

## 2022-01-31 NOTE — Telephone Encounter (Signed)
Patient with diagnosis of atrial fibrillation on Eliquis for anticoagulation.    Procedure: ectropion repair Date of procedure: 02/23/22   CHA2DS2-VASc Score = 5   This indicates a 7.2% annual risk of stroke. The patient's score is based upon: CHF History: 1 HTN History: 1 Diabetes History: 0 Stroke History: 0 Vascular Disease History: 1 Age Score: 2 Gender Score: 0   CrCl 72 Platelet count 303  Will confer with Dr. Claiborne Billings as to 5 day hold  **This guidance is not considered finalized until pre-operative APP has relayed final recommendations.**

## 2022-01-31 NOTE — Telephone Encounter (Signed)
Will route to pharm for input on [place]. Patient was recently seen by Almyra Deforest PA-C and noted to have event monitor with brief SVT/NSVT and echo with worsening mitral regurgitation with consideration of TEE in report; per notes, plan was to add spironolactone and repeat echo in 6 months. Will route to [facility] for input on whether he feels patient may proceed with eyelid repair under MAC as requested given recent OV findings.

## 2022-02-01 DIAGNOSIS — L309 Dermatitis, unspecified: Secondary | ICD-10-CM | POA: Diagnosis not present

## 2022-02-02 ENCOUNTER — Ambulatory Visit (HOSPITAL_COMMUNITY): Payer: Medicare HMO

## 2022-02-02 NOTE — Telephone Encounter (Signed)
Caller is follow-up on patient's clearance.

## 2022-02-03 ENCOUNTER — Other Ambulatory Visit: Payer: Self-pay | Admitting: Cardiology

## 2022-02-03 ENCOUNTER — Other Ambulatory Visit: Payer: Self-pay | Admitting: Cardiovascular Disease

## 2022-02-05 ENCOUNTER — Ambulatory Visit (HOSPITAL_COMMUNITY): Payer: Medicare HMO

## 2022-02-06 NOTE — Telephone Encounter (Signed)
[place] calling for an update on clearance.

## 2022-02-06 NOTE — Telephone Encounter (Signed)
Clinic visit 01/08/22 with monitor brief SVT/NSVT and echo worsening MR with consideration of TEE in report. Has OV 03/12/22 with Dr. Claiborne Billings to discuss. Called to offer sooner visit 02/13/22 with Dr. Claiborne Billings, but Timothy Munoz is unfortunately unavailable.   He reports no chest pain, dyspnea, palpitations, lightheadedness and endorses feeling well since last seen.   Will route to Dr. Claiborne Billings for input on procedural clearance as well as 5 day Eliquis hold (request to hold 3 days preop and 2 days postop).   Will route to Almyra Deforest, [place] as he saw Mr. Veselka most recently.   Will route to surgical team so they are aware we are still assessing clearance.  Loel Dubonnet, NP

## 2022-02-07 ENCOUNTER — Ambulatory Visit (HOSPITAL_COMMUNITY): Payer: Medicare HMO

## 2022-02-08 NOTE — Telephone Encounter (Signed)
Discussed with Nehemiah Massed

## 2022-02-09 ENCOUNTER — Ambulatory Visit (HOSPITAL_COMMUNITY): Payer: Medicare HMO

## 2022-02-09 NOTE — Telephone Encounter (Signed)
   Patient Name: Timothy Munoz  DOB: 1945/05/23 MRN: [id number]  Primary Cardiologist: Shelva Majestic, MD  Chart reviewed as part of pre-operative protocol coverage. Given past medical history and time since last visit, based on ACC/AHA guidelines, Santosh Petter Umar would be at acceptable risk for the planned procedure without further cardiovascular testing.   I recently seen the patient in the office to follow-up on echocardiogram and a heart monitor.  Patient is stable from the cardiac perspective.  He is at acceptable risk to proceed with eyelid surgery.  As for holding time of anticoagulation therapy prior to or after the surgery, it appears Dr. Claiborne Billings discussed the case with Rosita Fire our clinical pharmacist, however there was no documentation, we will reach out to [facility] to check if a decision was made on how long to hold the blood thinner.  I will route this recommendation to the requesting party via Epic fax function and remove from pre-op pool.  Please call with questions.  [place], [place] 02/09/2022, 1:01 PM

## 2022-02-10 NOTE — Telephone Encounter (Signed)
Reviewed case with Dr. Claiborne Billings - he is fine with request to hold 3 days prior and 2 days post.

## 2022-02-12 ENCOUNTER — Ambulatory Visit (HOSPITAL_COMMUNITY): Payer: Medicare HMO

## 2022-02-12 NOTE — Telephone Encounter (Signed)
Clearance faxed to [facility] with confirmation received  Emmaline Life, NP-C  02/12/2022, 10:34 AM [address], [address] Office [phone] Fax [phone]

## 2022-02-12 NOTE — Telephone Encounter (Signed)
[facility] calling back for an update on holding eliquis

## 2022-02-14 ENCOUNTER — Ambulatory Visit (HOSPITAL_COMMUNITY): Payer: Medicare HMO

## 2022-02-16 ENCOUNTER — Ambulatory Visit (HOSPITAL_COMMUNITY): Payer: Medicare HMO

## 2022-02-19 ENCOUNTER — Ambulatory Visit (HOSPITAL_COMMUNITY): Payer: Medicare HMO

## 2022-02-21 ENCOUNTER — Ambulatory Visit (HOSPITAL_COMMUNITY): Payer: Medicare HMO

## 2022-02-22 ENCOUNTER — Encounter (HOSPITAL_COMMUNITY): Payer: Self-pay | Admitting: Optometry

## 2022-02-22 NOTE — Anesthesia Preprocedure Evaluation (Addendum)
Anesthesia Evaluation  Patient identified by MRN, date of birth, ID band Patient awake    Reviewed: Allergy & Precautions, NPO status , Patient's Chart, lab work & pertinent test results  Airway Mallampati: II       Dental   Pulmonary former smoker   breath sounds clear to auscultation       Cardiovascular hypertension, + CAD, + Past MI and +CHF  + dysrhythmias  Rhythm:Regular Rate:Normal     Neuro/Psych    GI/Hepatic negative GI ROS, Neg liver ROS,,,  Endo/Other  negative endocrine ROS    Renal/GU negative Renal ROS     Musculoskeletal  (+) Arthritis ,    Abdominal   Peds  Hematology   Anesthesia Other Findings   Reproductive/Obstetrics                             Anesthesia Physical Anesthesia Plan  ASA: 3  Anesthesia Plan: MAC   Post-op Pain Management:    Induction: Intravenous  PONV Risk Score and Plan: 2 and Ondansetron, Dexamethasone, Midazolam and Treatment may vary due to age or medical condition  Airway Management Planned: Nasal Cannula and Simple Face Mask  Additional Equipment:   Intra-op Plan:   Post-operative Plan:   Informed Consent: I have reviewed the patients History and Physical, chart, labs and discussed the procedure including the risks, benefits and alternatives for the proposed anesthesia with the patient or authorized representative who has indicated his/her understanding and acceptance.     Dental advisory given  Plan Discussed with: CRNA and Anesthesiologist  Anesthesia Plan Comments: (PAT note written 02/22/2022 by Myra Gianotti, PA-C. )        Anesthesia Quick Evaluation

## 2022-02-22 NOTE — Progress Notes (Signed)
Mr. Timothy Munoz denies chest pain or shortness of breath. Patient denies having any s/s of Covid in his household, also denies any known exposure to Covid.   Mr. Timothy Munoz has type II diabetes, patient does not check CBG, he does not know what his last A1C was. PCP is Dr. Orpah Melter with [facility]. Mr. Timothy Munoz was not given instructions to hold Wilder Glade, he took it this am.  Mr. Timothy Munoz 's cardiologist is Dr. Corky Downs. Patient reports that he is feeling well, patient walks 30 mins a day.

## 2022-02-22 NOTE — Progress Notes (Addendum)
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   History includes former smoker, HTN, HLD, CAD(anterior STEMI with cardiogenic shock, s/p PTCA ostial LAD, IABP 01/16/22, s/p CABG: LIMA-LAD, SVG-OM1, SVG-Ramus INT 01/20/21), HFrEF, PAF (post-CABG PAF 01/2021; aflutter x1, s/p DCCV 09/20/21), essential tremor, spinal surgery.  Preoperative cardiology input outlined by Almyra Deforest, [place] on 02/09/22, "Given past medical history and time since last visit, based on ACC/AHA guidelines, Timothy Munoz would be at acceptable risk for the planned procedure without further cardiovascular testing.    I recently seen the patient in the office to follow-up on echocardiogram and a heart monitor.  Patient is stable from the cardiac perspective.  He is at acceptable risk to proceed with eyelid surgery." Tommy Medal, RPH-CPP added, "Reviewed case with Dr. Claiborne Billings - he is fine with request to hold 3 days prior and 2 days post."   He in on Eliquis, Coreg, Plavix, Fargixa, Entreso, Crestor, Aldactone. Per 01/29/22 presurgical evaluation by Doran Clay from [facility], "Cardiology clearance previously received given cardiac history and hospital admission May 2023 for CHF exacerbation.  However he is on eliquis and plavix and requires [facility] hold request which I will send to Dr. Claiborne Billings at [facility].  He will need to hold his plavix 5-7 days preop and eliquis 2-3 days preop."  He reported last Plavix 02/15/22, last Eliquis 02/20/22. He was not given instructions to hold Wilder Glade, so last dose 02/22/22.   He is a same day work-up, so anesthesia team to evaluate on the day of surgery.  Labs as  indicated on arrival.   VS:  BP Readings from Last 3 Encounters:  01/08/22 118/70  11/23/21 118/60  11/06/21 110/68   Pulse Readings from Last 3 Encounters:  01/08/22 (!) 52  11/23/21 61  11/06/21 67     PROVIDERS: Orpah Melter, MD is PCP  Shelva Majestic, MD is cardiologist Cristopher Peru, MD is EP cardiologist. Per 10/06/2021 visit, given a single episode of atrial flutter, it was recommended to continue monitoring.  If further episode occurs in the future, consider A-flutter ablation.   LABS: For day of surgery as indicated. Last results in [facility] include: Lab Results  Component Value Date   WBC 5.6 09/17/2021   HGB 11.6 (L) 09/17/2021   HCT 39.1 09/17/2021   PLT 303 09/17/2021   GLUCOSE 87 12/04/2021   ALT 21 09/27/2021   AST 26 09/27/2021   NA 136 12/04/2021   K 5.0 12/04/2021   CL 103 12/04/2021   CREATININE 1.24 12/04/2021   BUN 32 (H) 12/04/2021   CO2 23 12/04/2021   TSH 5.397 (H) 09/17/2021   INR 1.3 (H) 09/19/2021   HGBA1C 6.0 (H) 01/16/2021    IMAGES: CXR 09/17/21: IMPRESSION: Interstitial opacities suggestive of mild interstitial edema. Trace bilateral pleural effusions.    EKG: 11/23/21 ([facility]): Sinus rhythm with first-degree AV block.  Left anterior fascicular block.  Septal infarct, age undetermined.  T wave abnormality, consider lateral ischemia.   CV: Echo 01/03/22: IMPRESSIONS   1. Global hypokinesis worse in the septum, apex and inferior base. Left  ventricular ejection fraction, by estimation,  is 35 to 40%. The left  ventricle has moderately decreased function. The left ventricle has no  regional wall motion abnormalities. The  left ventricular internal cavity size was moderately dilated.   2. Right ventricular systolic function is normal. The right ventricular  size is normal.   3. Left atrial size was severely dilated.   4. No subcostal images.   5. Consider TEE to further asses degree of MR Likely ischemic with  restricted  posterior leaflet and atrial FMR as well . The mitral valve is  normal in structure. Moderate to severe mitral valve regurgitation. No  evidence of mitral stenosis.   6. Tricuspid valve regurgitation is moderate.   7. The aortic valve is normal in structure. Aortic valve regurgitation is  not visualized. No aortic stenosis is present.   8. Aortic dilatation noted. There is mild dilatation of the aortic root,  measuring 41 mm.   9. The inferior vena cava is normal in size with greater than 50%  respiratory variability, suggesting right atrial pressure of 3 mmHg.  - Comparison: 01/16/22: LVEF 35%, akinesis basal-mid anteroseptal, inferoseptal, anteiror, apical anterior, apical inferior wall, apical RV hypokinesis, mildly reduced RVSF in setting of anterior STEMI; 01/25/21: LVEF 40-45, global LV hypokinesis, anteroseptal, mid-apical inferoseptal, apical inferior, apical akinesis, normal RVSF, normal PASP; LVEF 35-40% 06/06/21; TEE 09/20/21: LVEF 20-25%, no LV RWMA, normal RVSF, moderate MR   ZioAT long term monitor 11/29/21-12/12/21: Patient had a min HR of 48 bpm, max HR of 167 bpm, and avg HR of 67 bpm. Predominant underlying rhythm was Sinus Rhythm. First Degree AV Block was present. 2 Ventricular Tachycardia runs occurred, the run with the fastest interval lasting 7 beats with a  max rate of 167 bpm (avg 129 bpm); the run with the fastest interval was also the longest. 8 Supraventricular Tachycardia runs occurred, the run with the fastest interval lasting 10 beats with a max rate of 150 bpm (avg 144 bpm); the run with the fastest  interval was also the longest. Isolated SVEs were rare (<1.0%), SVE Couplets were rare (<1.0%), and SVE Triplets were rare (<1.0%). Isolated VEs were rare (<1.0%, 413), VE Couplets were rare (<1.0%, 1), and VE Triplets were rare (<1.0%, 1).   The predominant rhythm was sinus rhythm with an average rate of 67 bpm with a range of 48-1 67.  There were 2 episodes of nonsustained VT  longest interval at 7 beats.  There were 8 brief episodes of SVT.  There were no episodes of atrial fibrillation or prolonged pauses.    TEE 09/20/21: IMPRESSIONS   1. Left ventricular ejection fraction, by estimation, is 20 to 25%. The  left ventricle has severely decreased function. The left ventricle has no  regional wall motion abnormalities.   2. Right ventricular systolic function is normal. The right ventricular  size is normal.   3. No left atrial/left atrial appendage thrombus was detected.   4. PISA radius 0.4 cm      Annular diameter 3.07 cm      Mal Coaptation 0.26 cm      MR ERO 0.13 cm2      MR Vol 12 ml      Normal pulmonary vein flow. The mitral valve is degenerative. Moderate  mitral valve regurgitation. No evidence of mitral stenosis.   5. The aortic valve is normal in structure. Aortic valve regurgitation is  not visualized. No aortic stenosis is present.   6. There is mild (Grade II) plaque.  7. The inferior vena cava is normal in size with greater than 50%  respiratory variability, suggesting right atrial pressure of 3 mmHg.    US Carotid 01/18/21: Summary:  - Right Carotid: No obvious evidence of stenosis in the Right ICA- unable to accurately obtain waveforms/ velocities due to balloon pump.  - Left Carotid: No obvious evidence of stenosis in the Left ICA -unable to accurately obtain waveforms/ velocities due to balloon pump.    LHC/PCI 01/16/21 (in setting of anterior STEMI):   Mid LM to Dist LM lesion is 20% stenosed.   Ost LAD to Prox LAD lesion is 100% stenosed.   Ost Cx lesion is 90% stenosed.   Ost RCA lesion is 20% stenosed.   Post intervention, there is a 60% residual stenosis.   There is moderate left ventricular systolic dysfunction.   LV end diastolic pressure is moderately elevated.   The left ventricular ejection fraction is 35-45% by visual estimate.   Acute anterolateral myocardial infarction secondary to total near ostial occlusion of a  large LAD which wraps around the LV apex.   Large normal ramus intermediate vessel.   Left circumflex vessel with 85 to 90% ostial stenosis.   Large RCA with 20% irregularity proximally.   PTCA of the LAD ostium with hypotension and idioventricular rhythm with reperfusion requiring initiation of Levophed reestablishment of TIMI-3 flow to a large LAD vessel which wraps around the LV apex.   Insertion of an intra-aortic balloon pump for optimal hemodynamic support with augmented diastolic pressure at [id number] millimeters mercury.   RECOMMENDATION: In light of the patient's complex anatomy, stenting was not performed due to high likelihood of jailing a very large ramus intermediate vessel and with concomitant ostial circumflex stenosis it was felt that surgical revascularization once patient stabilizes from his myocardial infarction would be optimal therapy. - s/p CABG: LIMA-LAD, SVG-OM1, SVG-Ramus INT 01/20/21   Past Medical History:  Diagnosis Date   Coronary artery disease    Dysrhythmia    post-CABG PAF; aflutter, s/p DCCV 09/20/21   HFrEF (heart failure with reduced ejection fraction) (HCC)    Hyperlipidemia    mild    Hypertension    Knee pain    Obesity    Tremor    Tremor, essential 03/04/2019    Past Surgical History:  Procedure Laterality Date   APPENDECTOMY     BACK SURGERY     CARDIAC CATHETERIZATION     CARDIOVERSION N/A 09/20/2021   Procedure: CARDIOVERSION;  Surgeon: Jerline Pain, MD;  Location: [place];  Service: Cardiovascular;  Laterality: N/A;   CORONARY ARTERY BYPASS GRAFT N/A 01/20/2021   Procedure: CORONARY ARTERY BYPASS GRAFTING (CABG) X 3 ON PUMP, USING LEFT INTERNAL MAMMARY ARTERY AND LEFT ENDOSCOPIC GREATER SAPHENOUS VEIN HARVEST CONDUITS;  Surgeon: Melrose Nakayama, MD;  Location: [place];  Service: Open Heart Surgery;  Laterality: N/A;   CORONARY/GRAFT ACUTE MI REVASCULARIZATION N/A 01/16/2021   Procedure: Coronary/Graft Acute MI Revascularization;   Surgeon: Troy Sine, MD;  Location: [place] [facility];  Service: Cardiovascular;  Laterality: N/A;   ENDOVEIN HARVEST OF GREATER SAPHENOUS VEIN Left 01/20/2021   Procedure: ENDOVEIN HARVEST OF GREATER SAPHENOUS VEIN;  Surgeon: Melrose Nakayama, MD;  Location: [place];  Service: Open Heart Surgery;  Laterality: Left;   HEMORRHOID SURGERY     IABP INSERTION N/A 01/16/2021   Procedure: IABP Insertion;  Surgeon: Troy Sine, MD;  Location: [place] [facility];  Service: Cardiovascular;  Laterality: N/A;  LEFT HEART CATH AND CORONARY ANGIOGRAPHY N/A 01/16/2021   Procedure: LEFT HEART CATH AND CORONARY ANGIOGRAPHY;  Surgeon: Troy Sine, MD;  Location: [place] [facility];  Service: Cardiovascular;  Laterality: N/A;   TEE WITHOUT CARDIOVERSION N/A 01/20/2021   Procedure: TRANSESOPHAGEAL ECHOCARDIOGRAM (TEE);  Surgeon: Melrose Nakayama, MD;  Location: [place];  Service: Open Heart Surgery;  Laterality: N/A;   TEE WITHOUT CARDIOVERSION N/A 09/20/2021   Procedure: TRANSESOPHAGEAL ECHOCARDIOGRAM (TEE);  Surgeon: Jerline Pain, MD;  Location: [facility] ENDOSCOPY;  Service: Cardiovascular;  Laterality: N/A;    MEDICATIONS: No current facility-administered medications for this encounter.    apixaban (ELIQUIS) 5 MG TABS tablet   carvedilol (COREG) 6.25 MG tablet   clopidogrel (PLAVIX) 75 MG tablet   dapagliflozin propanediol (FARXIGA) 10 MG TABS tablet   ENTRESTO 24-26 MG   primidone (MYSOLINE) 50 MG tablet   rosuvastatin (CRESTOR) 20 MG tablet   spironolactone (ALDACTONE) 25 MG tablet   furosemide (LASIX) 40 MG tablet   potassium chloride SA (KLOR-CON M) 20 MEQ tablet   tamsulosin (FLOMAX) 0.4 MG CAPS capsule    Myra Gianotti, PA-C Surgical Short Stay/Anesthesiology [facility] Phone [phone] [facility] Phone [phone] 02/22/2022 4:55 PM

## 2022-02-23 ENCOUNTER — Ambulatory Visit (HOSPITAL_COMMUNITY): Payer: Medicare HMO

## 2022-02-23 ENCOUNTER — Encounter (HOSPITAL_COMMUNITY)
Admission: RE | Disposition: A | Discharge status: Home / Self Care | Payer: Self-pay | Source: Home / Self Care | Attending: Optometry

## 2022-02-23 ENCOUNTER — Encounter (HOSPITAL_COMMUNITY): Payer: Self-pay | Admitting: Optometry

## 2022-02-23 ENCOUNTER — Ambulatory Visit (HOSPITAL_COMMUNITY): Payer: Medicare HMO | Admitting: Vascular Surgery

## 2022-02-23 ENCOUNTER — Other Ambulatory Visit: Payer: Self-pay

## 2022-02-23 ENCOUNTER — Ambulatory Visit (HOSPITAL_BASED_OUTPATIENT_CLINIC_OR_DEPARTMENT_OTHER): Payer: Medicare HMO | Admitting: Vascular Surgery

## 2022-02-23 ENCOUNTER — Ambulatory Visit (HOSPITAL_COMMUNITY)
Admission: RE | Admit: 2022-02-23 | Discharge: 2022-02-23 | Disposition: A | Discharge status: Home / Self Care | Payer: Medicare HMO | Attending: Optometry | Admitting: Optometry

## 2022-02-23 DIAGNOSIS — I5022 Chronic systolic (congestive) heart failure: Secondary | ICD-10-CM | POA: Diagnosis not present

## 2022-02-23 DIAGNOSIS — E785 Hyperlipidemia, unspecified: Secondary | ICD-10-CM | POA: Diagnosis not present

## 2022-02-23 DIAGNOSIS — I2510 Atherosclerotic heart disease of native coronary artery without angina pectoris: Secondary | ICD-10-CM

## 2022-02-23 DIAGNOSIS — H02105 Unspecified ectropion of left lower eyelid: Secondary | ICD-10-CM | POA: Diagnosis not present

## 2022-02-23 DIAGNOSIS — Z951 Presence of aortocoronary bypass graft: Secondary | ICD-10-CM | POA: Insufficient documentation

## 2022-02-23 DIAGNOSIS — Z7901 Long term (current) use of anticoagulants: Secondary | ICD-10-CM | POA: Insufficient documentation

## 2022-02-23 DIAGNOSIS — I110 Hypertensive heart disease with heart failure: Secondary | ICD-10-CM | POA: Diagnosis not present

## 2022-02-23 DIAGNOSIS — Z87891 Personal history of nicotine dependence: Secondary | ICD-10-CM

## 2022-02-23 DIAGNOSIS — I509 Heart failure, unspecified: Secondary | ICD-10-CM | POA: Diagnosis not present

## 2022-02-23 DIAGNOSIS — H02132 Senile ectropion of right lower eyelid: Secondary | ICD-10-CM

## 2022-02-23 DIAGNOSIS — I252 Old myocardial infarction: Secondary | ICD-10-CM

## 2022-02-23 DIAGNOSIS — H02135 Senile ectropion of left lower eyelid: Secondary | ICD-10-CM | POA: Diagnosis not present

## 2022-02-23 DIAGNOSIS — Z7902 Long term (current) use of antithrombotics/antiplatelets: Secondary | ICD-10-CM | POA: Diagnosis not present

## 2022-02-23 DIAGNOSIS — I480 Paroxysmal atrial fibrillation: Secondary | ICD-10-CM | POA: Diagnosis not present

## 2022-02-23 DIAGNOSIS — G250 Essential tremor: Secondary | ICD-10-CM | POA: Diagnosis not present

## 2022-02-23 DIAGNOSIS — Z9861 Coronary angioplasty status: Secondary | ICD-10-CM | POA: Diagnosis not present

## 2022-02-23 DIAGNOSIS — Z79899 Other long term (current) drug therapy: Secondary | ICD-10-CM | POA: Insufficient documentation

## 2022-02-23 DIAGNOSIS — H02102 Unspecified ectropion of right lower eyelid: Secondary | ICD-10-CM | POA: Diagnosis not present

## 2022-02-23 HISTORY — PX: ECTROPION REPAIR: SHX357

## 2022-02-23 HISTORY — DX: Heart failure, unspecified: I50.9

## 2022-02-23 HISTORY — DX: Unspecified systolic (congestive) heart failure: I50.20

## 2022-02-23 HISTORY — DX: Cardiac arrhythmia, unspecified: I49.9

## 2022-02-23 HISTORY — DX: Disease of blood and blood-forming organs, unspecified: D75.9

## 2022-02-23 HISTORY — DX: Acute myocardial infarction, unspecified: I21.9

## 2022-02-23 HISTORY — DX: Unspecified osteoarthritis, unspecified site: M19.90

## 2022-02-23 LAB — CBC
HCT: 37.9 % — ABNORMAL LOW (ref 39.0–52.0)
Hemoglobin: 11.5 g/dL — ABNORMAL LOW (ref 13.0–17.0)
MCH: 26.3 pg (ref 26.0–34.0)
MCHC: 30.3 g/dL (ref 30.0–36.0)
MCV: 86.7 fL (ref 80.0–100.0)
Platelets: 207 10*3/uL (ref 150–400)
RBC: 4.37 MIL/uL (ref 4.22–5.81)
RDW: 15.4 % (ref 11.5–15.5)
WBC: 5.4 10*3/uL (ref 4.0–10.5)
nRBC: 0 % (ref 0.0–0.2)

## 2022-02-23 LAB — BASIC METABOLIC PANEL
Anion gap: 5 (ref 5–15)
BUN: 24 mg/dL — ABNORMAL HIGH (ref 8–23)
CO2: 24 mmol/L (ref 22–32)
Calcium: 8.9 mg/dL (ref 8.9–10.3)
Chloride: 110 mmol/L (ref 98–111)
Creatinine, Ser: 1.23 mg/dL (ref 0.61–1.24)
GFR, Estimated: >60 mL/min (ref >60)
Glucose, Bld: 89 mg/dL (ref 70–99)
Potassium: 4.4 mmol/L (ref 3.5–5.1)
Sodium: 139 mmol/L (ref 135–145)

## 2022-02-23 SURGERY — REPAIR, ECTROPION, EYELID
Anesthesia: Monitor Anesthesia Care | Site: Eye | Laterality: Bilateral

## 2022-02-23 MED ORDER — APIXABAN 5 MG PO TABS: 5.0000 mg | ORAL_TABLET | Freq: Two times a day (BID) | ORAL | 3 refills | Status: DC

## 2022-02-23 MED ORDER — PROPOFOL 10 MG/ML IV BOLUS
INTRAVENOUS | Status: AC
  Filled 2022-02-23: qty 20

## 2022-02-23 MED ORDER — BUPIVACAINE HCL (PF) 0.75 % IJ SOLN
INTRAMUSCULAR | Status: AC
  Filled 2022-02-23: qty 10

## 2022-02-23 MED ORDER — BSS IO SOLN
INTRAOCULAR | Status: AC
  Filled 2022-02-23: qty 15

## 2022-02-23 MED ORDER — FENTANYL CITRATE (PF) 250 MCG/5ML IJ SOLN
INTRAMUSCULAR | Status: AC
  Filled 2022-02-23: qty 5

## 2022-02-23 MED ORDER — ORAL CARE MOUTH RINSE: 15.0000 mL | Freq: Once | OROMUCOSAL | Status: AC

## 2022-02-23 MED ORDER — CHLORHEXIDINE GLUCONATE 0.12 % MT SOLN
15.0000 mL | Freq: Once | OROMUCOSAL | Status: AC
  Administered 2022-02-23: 15 mL via OROMUCOSAL
  Filled 2022-02-23: qty 15

## 2022-02-23 MED ORDER — LIDOCAINE-EPINEPHRINE 1 %-1:100000 IJ SOLN
INTRAMUSCULAR | Status: AC
  Filled 2022-02-23: qty 1

## 2022-02-23 MED ORDER — LIDOCAINE-EPINEPHRINE 1 %-1:100000 IJ SOLN
INTRAMUSCULAR | Status: DC | PRN
  Administered 2022-02-23: 5 mL

## 2022-02-23 MED ORDER — CLOPIDOGREL BISULFATE 75 MG PO TABS: 75.0000 mg | ORAL_TABLET | Freq: Every day | ORAL | 0 refills | Status: DC

## 2022-02-23 MED ORDER — LACTATED RINGERS IV SOLN: INTRAVENOUS | Status: DC

## 2022-02-23 MED ORDER — ERYTHROMYCIN 5 MG/GM OP OINT
TOPICAL_OINTMENT | Freq: Once | OPHTHALMIC | Status: AC
Start: 2022-02-23 — End: 2022-02-23
  Administered 2022-02-23: 1 via OPHTHALMIC
  Filled 2022-02-23: qty 3.5

## 2022-02-23 MED ORDER — FENTANYL CITRATE (PF) 100 MCG/2ML IJ SOLN
INTRAMUSCULAR | Status: DC | PRN
  Administered 2022-02-23: 50 ug via INTRAVENOUS

## 2022-02-23 MED ORDER — TETRACAINE HCL 0.5 % OP SOLN
OPHTHALMIC | Status: AC
  Filled 2022-02-23: qty 4

## 2022-02-23 MED ORDER — TETRACAINE HCL 0.5 % OP SOLN
OPHTHALMIC | Status: DC | PRN
  Administered 2022-02-23: 2 [drp] via OPHTHALMIC

## 2022-02-23 MED ORDER — ERYTHROMYCIN 5 MG/GM OP OINT
TOPICAL_OINTMENT | OPHTHALMIC | Status: AC
  Filled 2022-02-23: qty 3.5

## 2022-02-23 MED ORDER — OXYMETAZOLINE HCL 0.05 % NA SOLN
NASAL | Status: AC
  Filled 2022-02-23: qty 30

## 2022-02-23 MED ORDER — PROPOFOL 10 MG/ML IV BOLUS
INTRAVENOUS | Status: DC | PRN
  Administered 2022-02-23: 30 mg via INTRAVENOUS
  Administered 2022-02-23: 50 mg via INTRAVENOUS

## 2022-02-23 MED ORDER — BSS IO SOLN
INTRAOCULAR | Status: DC | PRN
  Administered 2022-02-23: 15 mL via INTRAOCULAR

## 2022-02-23 SURGICAL SUPPLY — 49 items
APPLICATOR COTTON TIP 6 STRL (MISCELLANEOUS) ×1 IMPLANT
APPLICATOR COTTON TIP 6IN STRL (MISCELLANEOUS) ×1
APPLICATOR DR MATTHEWS STRL (MISCELLANEOUS) IMPLANT
BAG COUNTER SPONGE SURGICOUNT (BAG) ×1 IMPLANT
BLADE SURG 15 STRL LF DISP TIS (BLADE) ×1 IMPLANT
BLADE SURG 15 STRL SS (BLADE) ×1
BNDG EYE OVAL (GAUZE/BANDAGES/DRESSINGS) IMPLANT
CLSR STERI-STRIP ANTIMIC 1/2X4 (GAUZE/BANDAGES/DRESSINGS) IMPLANT
CORD BIPOLAR FORCEPS 12FT (ELECTRODE) ×1 IMPLANT
COVER SURGICAL LIGHT HANDLE (MISCELLANEOUS) ×1 IMPLANT
DRAPE ORTHO SPLIT 77X108 STRL (DRAPES) ×1
DRAPE SURG ORHT 6 SPLT 77X108 (DRAPES) ×1 IMPLANT
DRSG TELFA 3X8 NADH STRL (GAUZE/BANDAGES/DRESSINGS) IMPLANT
ELECT NDL BLADE 2-5/6 (NEEDLE) IMPLANT
ELECT NEEDLE BLADE 2-5/6 (NEEDLE) IMPLANT
ELECT REM PT RETURN 9FT ADLT (ELECTROSURGICAL) ×1
ELECTRODE REM PT RTRN 9FT ADLT (ELECTROSURGICAL) ×1 IMPLANT
FORCEPS BIPOLAR SPETZLER 8 1.0 (NEUROSURGERY SUPPLIES) ×1 IMPLANT
FRAME EYE SHIELD (PROTECTIVE WEAR) IMPLANT
GLOVE SS BIOGEL STRL SZ 7.5 (GLOVE) ×1 IMPLANT
GOWN STRL REUS W/ TWL LRG LVL3 (GOWN DISPOSABLE) ×2 IMPLANT
GOWN STRL REUS W/TWL LRG LVL3 (GOWN DISPOSABLE) ×2
KIT BASIN OR (CUSTOM PROCEDURE TRAY) ×1 IMPLANT
MARKER SKIN DUAL TIP RULER LAB (MISCELLANEOUS) ×1 IMPLANT
NDL HYPO 30X.5 LL (NEEDLE) IMPLANT
NDL PRECISIONGLIDE 27X1.5 (NEEDLE) IMPLANT
NEEDLE HYPO 30X.5 LL (NEEDLE) ×1 IMPLANT
NEEDLE PRECISIONGLIDE 27X1.5 (NEEDLE) IMPLANT
NS IRRIG 1000ML POUR BTL (IV SOLUTION) ×1 IMPLANT
PACK BASIC III (CUSTOM PROCEDURE TRAY) ×1
PACK CATARACT CUSTOM (CUSTOM PROCEDURE TRAY) ×1 IMPLANT
PACK SRG BSC III STRL LF ECLPS (CUSTOM PROCEDURE TRAY) IMPLANT
PAD ARMBOARD 7.5X6 YLW CONV (MISCELLANEOUS) ×2 IMPLANT
PATTIES SURGICAL .5 X3 (DISPOSABLE) IMPLANT
PENCIL BUTTON HOLSTER BLD 10FT (ELECTRODE) IMPLANT
PROTECTOR CORNEAL (OPHTHALMIC RELATED) IMPLANT
SET INTBT LACRIMAL .016X.025 (DRAIN) IMPLANT
SUT CHROMIC 5 0 P 3 (SUTURE) IMPLANT
SUT ETHILON 6 0 P 1 (SUTURE) IMPLANT
SUT ETHILON 7 0 P 6 18 (SUTURE) IMPLANT
SUT MERSILENE 5 0 RD 1 DA (SUTURE) IMPLANT
SUT PLAIN 5 0 P 3 18 (SUTURE) IMPLANT
SUT PLAIN 6 0 TG1408 (SUTURE) IMPLANT
SUT PLAIN GUT FAST 5-0 (SUTURE) IMPLANT
SUT SILK 4 0 P 3 (SUTURE) IMPLANT
SUT VIC AB 4-0 P2 18 (SUTURE) IMPLANT
SUT VICRYL 6-0 S14 (SUTURE) IMPLANT
TOWEL GREEN STERILE FF (TOWEL DISPOSABLE) ×1 IMPLANT
WATER STERILE IRR 1000ML POUR (IV SOLUTION) ×1 IMPLANT

## 2022-02-23 NOTE — Transfer of Care (Signed)
Immediate Anesthesia Transfer of Care Note  Patient: Timothy Munoz  Procedure(s) Performed: REPAIR OF ECTROPION (Bilateral: Eye)  Patient Location: PACU  Anesthesia Type:MAC  Level of Consciousness: awake, alert  and oriented  Airway & Oxygen Therapy: Patient Spontanous Breathing and Patient connected to nasal cannula oxygen  Post-op Assessment: Report given to RN and Post -op Vital signs reviewed and stable  Post vital signs: Reviewed and stable  Last Vitals:  Vitals Value Taken Time  BP 131/86 02/23/22 0830  Temp    Pulse 63 02/23/22 0832  Resp 8 02/23/22 0832  SpO2 99 % 02/23/22 0832  Vitals shown include unvalidated device data.  Last Pain:  Vitals:   02/23/22 0605  TempSrc:   PainSc: 0-No pain         Complications: No notable events documented.

## 2022-02-23 NOTE — Interval H&P Note (Signed)
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  The various methods of treatment have been discussed with the patient and family. After consideration of risks, benefits and other options for treatment, the patient has consented to  Procedure(s): REPAIR OF ECTROPION (Bilateral) as a surgical intervention.  The patient's history has been reviewed, patient examined, no change in status, stable for surgery.  I have reviewed the patient's chart and labs.  Questions were answered to the patient's satisfaction.     Delia Chimes

## 2022-02-23 NOTE — Op Note (Signed)
Operative Note PATIENT NAME:  [place]: [facility] MRN:  [id number] CSN: [id number] DATE OF SERVICE:  02/23/2022 DATE OF BIRTH:  January 16, 1946  PREOPERATIVE DIAGNOSIS:   1. Bilateral lower eyelid ectropion, senile  POSTOPERATIVE DIAGNOSIS:  same  PROCEDURE(S) PERFORMED:  1. Bilateral lower eyelid ectropion repair by lateral tarsal strip [id number]  SURGEON:  Delia Chimes, MD, PhD ASSISTANT:  none  ANESTHESIA:  MAC  FLUIDS GIVEN: Per Anesthesia   ESTIMATED BLOOD LOSS:  < 5 cc  TUBES/DRAINS:  None  COMPLICATIONS:  None  INDICATIONS:  The patient has bilateral lower eyelid ectropion of the eyelids causing visual impairment  that interferes with tasks of daily living including reading and computer use.  Informed consent had been provided prior to the day of surgery at the patient's clinic visit.  All questions were answered.  The patient understands that the goal of surgery is to improve visual function. Prior to entering the operative suite, the general surgical consent was signed. All questions were answered.  The patient/advocate understands the risks of surgery include but are not limited to bleeding, infection, scar formation, asymmetry, the need for additional surgical intervention and other less common outcomes noted on the specific eyelid informed consent and anesthesia risks listed on the anesthesia consent. The patient accepts the risks and desires to proceed with surgery        DESCRIPTION OF PROCEDURE: After obtaining informed consent, the patient was taken back to the operating room and laid supine on the operating room table.  Under monitored anesthesia care, the operative site(s) were anesthetized with 2% lidocaine with epinephrine and bupivicaine.    Attention was then directed to the right lower eyelid. A 15 blade was used to incise the lateral canthus. A Wescott scissors were used to detach the inferior. lateral canthal tendon to allow for  full mobility, Then the lateral tarsal strip was created.  A 4-0 undyed vicryl suture was taken through the inferior and super crus of the lateral canthus and the secured to the periosteum of the lateral orbital rim. The lateral canthal angle was closed with 4-0 vicryl. Excess skin and lashes were excised with Westcott scissors. The gray line of the upper and lower eyelids and skin were then closed with 5-0 fast absorbing gut suture. The same procedure was performed on the contralateral eye. Ointment was placed in the operative eye(s) and incision.   The patient was then taken to the recovery room in stable condition.   Delia Chimes, MD, PhD Ophthalmic Plastic and Reconstructive Surgery [facility] [phone] (office)

## 2022-02-23 NOTE — Anesthesia Postprocedure Evaluation (Signed)
Anesthesia Post Note  Patient: Timothy Munoz  Procedure(s) Performed: REPAIR OF ECTROPION (Bilateral: Eye)     Patient location during evaluation: PACU Anesthesia Type: MAC Level of consciousness: awake Pain management: pain level controlled Vital Signs Assessment: post-procedure vital signs reviewed and stable Respiratory status: spontaneous breathing Cardiovascular status: stable Postop Assessment: no apparent nausea or vomiting Anesthetic complications: no   No notable events documented.  Last Vitals:  Vitals:   02/23/22 0847 02/23/22 0848  BP: 131/86   Pulse: 66 61  Resp: 17 (!) 21  Temp: 36.5 C   SpO2: 98% 98%    Last Pain:  Vitals:   02/23/22 0847  TempSrc:   PainSc: 0-No pain                 Raylon Lamson

## 2022-02-23 NOTE — Anesthesia Procedure Notes (Signed)
Procedure Name: MAC Date/Time: 02/23/2022 7:43 AM  Performed by: Lieutenant Diego, CRNAPre-anesthesia Checklist: Patient identified, Emergency Drugs available, Suction available, Patient being monitored and Timeout performed Patient Re-evaluated:Patient Re-evaluated prior to induction Oxygen Delivery Method: Nasal cannula Preoxygenation: Pre-oxygenation with 100% oxygen Induction Type: IV induction

## 2022-02-24 ENCOUNTER — Encounter (HOSPITAL_COMMUNITY): Payer: Self-pay | Admitting: Optometry

## 2022-03-12 ENCOUNTER — Ambulatory Visit: Payer: Medicare HMO | Attending: Cardiovascular Disease | Admitting: Cardiovascular Disease

## 2022-03-12 ENCOUNTER — Encounter: Payer: Self-pay | Admitting: Cardiovascular Disease

## 2022-03-12 DIAGNOSIS — I340 Nonrheumatic mitral (valve) insufficiency: Secondary | ICD-10-CM

## 2022-03-12 DIAGNOSIS — I480 Paroxysmal atrial fibrillation: Secondary | ICD-10-CM

## 2022-03-12 DIAGNOSIS — Z951 Presence of aortocoronary bypass graft: Secondary | ICD-10-CM

## 2022-03-12 DIAGNOSIS — I255 Ischemic cardiomyopathy: Secondary | ICD-10-CM

## 2022-03-12 DIAGNOSIS — E785 Hyperlipidemia, unspecified: Secondary | ICD-10-CM | POA: Diagnosis not present

## 2022-03-12 DIAGNOSIS — I2102 ST elevation (STEMI) myocardial infarction involving left anterior descending coronary artery: Secondary | ICD-10-CM

## 2022-03-12 DIAGNOSIS — Z8679 Personal history of other diseases of the circulatory system: Secondary | ICD-10-CM | POA: Diagnosis not present

## 2022-03-12 MED ORDER — ASPIRIN 81 MG PO TBEC: 81.0000 mg | DELAYED_RELEASE_TABLET | Freq: Every day | ORAL | 3 refills | Status: DC

## 2022-03-12 NOTE — Patient Instructions (Signed)
Medication Instructions:  STOP Plavix START baby aspirin 81 mg daily   *If you need a refill on your cardiac medications before your next appointment, please call your pharmacy*   Follow-Up: At [facility], you and your health needs are our priority.  As part of our continuing mission to provide you with exceptional heart care, we have created designated Provider Care Teams.  These Care Teams include your primary Cardiologist (physician) and Advanced Practice Providers (APPs -  Physician Assistants and Nurse Practitioners) who all work together to provide you with the care you need, when you need it.  We recommend signing up for the patient portal called "MyChart".  Sign up information is provided on this After Visit Summary.  MyChart is used to connect with patients for Virtual Visits (Telemedicine).  Patients are able to view lab/test results, encounter notes, upcoming appointments, etc.  Non-urgent messages can be sent to your provider as well.   To learn more about what you can do with MyChart, go to [email or web address].    Your next appointment:   5 month(s)  The format for your next appointment:   In Person  Provider:   Shelva Majestic, MD

## 2022-03-12 NOTE — Progress Notes (Signed)
Cardiology Office Note    Date:  03/16/2022   ID:  Timothy Munoz, DOB 02-18-1946, MRN [id number]  PCP:  Orpah Melter, MD  Cardiologist:  Shelva Majestic, MD   5 month F/u office visit with me   History of Present Illness:  Timothy Munoz is a 76 y.o. male who suffered an anterolateral ST segment elevation myocardial infarction in September 2022 and underwent initial balloon angioplasty of his ostial LAD.  His course was complicated by cardiogenic shock with reduced LV function with EF at 35%.  He required intra-aortic balloon counterpulsation for hemodynamic support and ultimately underwent CABG revascularization surgery x3 in January 20, 2021 by Dr. Roxan Hockey.  He had developed postoperative atrial fibrillation requiring amiodarone and was also treated with digoxin.  An echo Doppler study on January 25, 2021 showed slight improvement in LV function with EF at 40 to 45% with grade 2 diastolic dysfunction.  He was on furosemide for volume overload.  He was subsequently seen in the office setting by Coletta Memos, NP on May 26, 2020 and his Lasix was changed to as needed.  Repeat echo Doppler study in June 06, 2021 showed EF 35 to 40% with moderate PA systolic pressure elevation at 53.4 mm, biatrial enlargement, and moderate to severe MR.  He was started on Farxiga with subsequent dose reduction due to creatinine elevation.  He was seen in April 2023 by Almyra Deforest, PA and was transition from ARB therapy to Entresto 24/26 mg twice a day.  He was hospitalized on May 7 with discharge Sep 21, 2021 increasing dyspnea and 5 pound weight gain in addition to significant lower extremity edema.  He was treated with IV furosemide and an echo showed further reduction of EF at 26% on Sep 18, 2021.  He was in atrial flutter and on May 10 underwent TEE guided cardioversion with restoration of sinus rhythm and plan for  subsequent EP evaluation was recommended with Dr. Lovena Le.  I last saw him on Sep 27, 2021 at which time he felt well and denied chest pain or shortness of breath.  He was on carvedilol 6.25 mg twice a day, Entresto 24/26 mg twice a day, as needed furosemide, Farxiga 5 mg, and is on Plaquenil 75 mg and Eliquis 5 mg twice a day.  During that evaluation, his blood pressure remained low precluding further dose titration of his guideline directed medical therapy.  Subsequent laboratory was recommended.  He saw Dr. Lovena Le on Oct 06, 2021 and given a single episode of atrial flutter he was recommended to continue monitoring.  Since I last saw him, he has been evaluated by Almyra Deforest, PA-C on several occasions with his last being in August 2023.  He had worn a monitor which showed 2 episodes of nonsustained VT the longest only 7 beats with 8 brief episodes of SVT.    Presently, he has continued to feel well.  He recently underwent bilateral eyelid surgery on February 20, 2022 at [facility].  Currently he has been on Eliquis 5 mg twice a day, Plavix 75 mg daily and continues to be on Entresto 24/26 mg twice a day, Farxiga 5 mg daily, spironolactone 12.5 mg every third day.  He he is on rosuvastatin 20 mg daily.  He denies chest pain or shortness of breath.  He denies any dizziness or palpitations.  He presents for evaluation.   Past Medical History:  Diagnosis Date   Arthritis    Blood dyscrasia  CHF (congestive heart failure) (HCC)    Coronary artery disease    Dysrhythmia    post-CABG PAF; aflutter, s/p DCCV 09/20/21   HFrEF (heart failure with reduced ejection fraction) (HCC)    Hyperlipidemia    mild    Hypertension    Knee pain    Myocardial infarction ([place])    Obesity    Tremor    Tremor, essential 03/04/2019    Past Surgical History:  Procedure Laterality Date   APPENDECTOMY     BACK SURGERY     CARDIAC CATHETERIZATION     CARDIOVERSION N/A 09/20/2021   Procedure: CARDIOVERSION;  Surgeon: Jerline Pain, MD;  Location: [place];  Service: Cardiovascular;   Laterality: N/A;   CORONARY ARTERY BYPASS GRAFT N/A 01/20/2021   Procedure: CORONARY ARTERY BYPASS GRAFTING (CABG) X 3 ON PUMP, USING LEFT INTERNAL MAMMARY ARTERY AND LEFT ENDOSCOPIC GREATER SAPHENOUS VEIN HARVEST CONDUITS;  Surgeon: Melrose Nakayama, MD;  Location: [place];  Service: Open Heart Surgery;  Laterality: N/A;   CORONARY/GRAFT ACUTE MI REVASCULARIZATION N/A 01/16/2021   Procedure: Coronary/Graft Acute MI Revascularization;  Surgeon: Troy Sine, MD;  Location: [facility] [facility];  Service: Cardiovascular;  Laterality: N/A;   ECTROPION REPAIR Bilateral 02/23/2022   Procedure: REPAIR OF ECTROPION;  Surgeon: Delia Chimes, MD;  Location: [place];  Service: Ophthalmology;  Laterality: Bilateral;   ENDOVEIN HARVEST OF GREATER SAPHENOUS VEIN Left 01/20/2021   Procedure: ENDOVEIN HARVEST OF GREATER SAPHENOUS VEIN;  Surgeon: Melrose Nakayama, MD;  Location: [place];  Service: Open Heart Surgery;  Laterality: Left;   HEMORRHOID SURGERY     IABP INSERTION N/A 01/16/2021   Procedure: IABP Insertion;  Surgeon: Troy Sine, MD;  Location: [place] [facility];  Service: Cardiovascular;  Laterality: N/A;   LEFT HEART CATH AND CORONARY ANGIOGRAPHY N/A 01/16/2021   Procedure: LEFT HEART CATH AND CORONARY ANGIOGRAPHY;  Surgeon: Troy Sine, MD;  Location: [place] [facility];  Service: Cardiovascular;  Laterality: N/A;   TEE WITHOUT CARDIOVERSION N/A 01/20/2021   Procedure: TRANSESOPHAGEAL ECHOCARDIOGRAM (TEE);  Surgeon: Melrose Nakayama, MD;  Location: [place];  Service: Open Heart Surgery;  Laterality: N/A;   TEE WITHOUT CARDIOVERSION N/A 09/20/2021   Procedure: TRANSESOPHAGEAL ECHOCARDIOGRAM (TEE);  Surgeon: Jerline Pain, MD;  Location: [facility];  Service: Cardiovascular;  Laterality: N/A;    Current Medications: Outpatient Medications Prior to Visit  Medication Sig Dispense Refill   apixaban (ELIQUIS) 5 MG TABS tablet Take 1 tablet (5 mg total) by mouth 2 (two) times  daily. 180 tablet 3   carvedilol (COREG) 6.25 MG tablet Take 1 tablet (6.25 mg total) by mouth 2 (two) times daily with a meal. 180 tablet 1   dapagliflozin propanediol (FARXIGA) 10 MG TABS tablet Take 5 mg by mouth daily.     ENTRESTO 24-26 MG TAKE ONE TABLET BY MOUTH TWICE DAILY 60 tablet 2   primidone (MYSOLINE) 50 MG tablet Take 1-2 tablets daily for tremor (Patient taking differently: Take 100 mg by mouth daily.) 180 tablet 3   rosuvastatin (CRESTOR) 20 MG tablet Take 1 tablet (20 mg total) by mouth daily. 90 tablet 3   spironolactone (ALDACTONE) 25 MG tablet Take 0.5 tablet every THIRD day 45 tablet 0   clopidogrel (PLAVIX) 75 MG tablet Take 1 tablet (75 mg total) by mouth daily. 30 tablet 0   furosemide (LASIX) 40 MG tablet Take 1 tablet (40 mg total) by mouth daily as needed for fluid. (Patient  not taking: Reported on 11/23/2021) 90 tablet 3   potassium chloride SA (KLOR-CON M) 20 MEQ tablet Take 1 tablet (20 mEq total) by mouth daily as needed (take along with furosemide). (Patient not taking: Reported on 11/23/2021) 90 tablet 3   tamsulosin (FLOMAX) 0.4 MG CAPS capsule Take 1 capsule (0.4 mg total) by mouth daily after breakfast. (Patient not taking: Reported on 02/19/2022) 30 capsule 4   No facility-administered medications prior to visit.     Allergies:   Patient has no known allergies.   Social History   Socioeconomic History   Marital status: Single    Spouse name: Not on file   Number of children: 2   Years of education: 13   Highest education level: High school graduate  Occupational History   Occupation: retired    Comment: Clinical cytogeneticist  Tobacco Use   Smoking status: Former   Smokeless tobacco: Former    Types: Chew    Quit date: 01/16/2021  Vaping Use   Vaping Use: Never used  Substance and Sexual Activity   Alcohol use: Not Currently    Comment: one beer every two months   Drug use: No   Sexual activity: Not on file  Other Topics Concern   Not on file   Social History Narrative   Right handed    Social Determinants of Health   Financial Resource Strain: Not on file  Food Insecurity: Not on file  Transportation Needs: Not on file  Physical Activity: Not on file  Stress: Not on file  Social Connections: Not on file    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  He worked for over 40 years.  Prior tobacco history, quit smoking over 30 years ago  Family History:  The patient's family history includes Alzheimer's disease in his mother; Hypertension in his father and mother; Parkinson's disease in his mother; Suicidality in his father.   ROS General: Negative; No fevers, chills, or night sweats;  HEENT: Negative; No changes in vision or hearing, sinus congestion, difficulty swallowing Pulmonary: Negative; No cough, wheezing, shortness of breath, hemoptysis Cardiovascular: Negative; No chest pain, presyncope, syncope, palpitations GI: Negative; No nausea, vomiting, diarrhea, or abdominal pain GU: Negative; No dysuria, hematuria, or difficulty voiding Musculoskeletal: Negative; no myalgias, joint pain, or weakness Hematologic/Oncology: Negative; no easy bruising, bleeding Endocrine: Negative; no heat/cold intolerance; no diabetes Neuro: Negative; no changes in balance, headaches Skin: Negative; No rashes or skin lesions Psychiatric: Negative; No behavioral problems, depression Sleep: Negative; No snoring, daytime sleepiness, hypersomnolence, bruxism, restless legs, hypnogognic hallucinations, no cataplexy Other comprehensive 14 point system review is negative.   PHYSICAL EXAM:   VS:  BP 98/64   Pulse 66   Ht 6' 2" (1.88 m)   Wt 221 lb 3.2 oz (100.3 kg)   SpO2 97%   BMI 28.40 kg/m     Repeat blood pressure by me was 104/68  Wt Readings from Last 3 Encounters:  03/12/22 221 lb 3.2 oz (100.3 kg)  02/23/22 216 lb (98 kg)  01/08/22 217 lb 3.2 oz (98.5 kg)    General: Alert, oriented, no  distress.  Skin: normal turgor, no rashes, warm and dry HEENT: Normocephalic, atraumatic. Pupils equal round and reactive to light; sclera anicteric; extraocular muscles intact; Nose without nasal septal hypertrophy Mouth/Parynx benign; Mallinpatti scale 3 Neck: No JVD, no carotid bruits; normal carotid upstroke Lungs: clear to ausculatation and percussion; no wheezing or rales Chest wall: without tenderness to  palpitation Heart: PMI not displaced, RRR, s1 s2 normal, 1/6 systolic murmur, no diastolic murmur, no rubs, gallops, thrills, or heaves Abdomen: soft, nontender; no hepatosplenomehaly, BS+; abdominal aorta nontender and not dilated by palpation. Back: no CVA tenderness Pulses 2+ Musculoskeletal: full range of motion, normal strength, no joint deformities Extremities: no clubbing cyanosis or edema, Homan's sign negative  Neurologic: grossly nonfocal; Cranial nerves grossly wnl Psychologic: Normal mood and affect   Studies/Labs Reviewed:   March 12, 2022 ECG (independently read by me): NSR at 66, QS V1-2; I,aVL; PRWP   Sep 27, 2021 ECG (independently read by me): Sinus rhythm at 70 bpm with first-degree AV block, PR interval 260 ms.  QS complex V1 through V5 consistent with prior anterior MI.  QTc increased at 473 ms.  Recent Labs:    Latest Ref Rng & Units 02/23/2022    6:29 AM 12/04/2021    9:32 AM 11/23/2021    9:48 AM  BMP  Glucose 70 - 99 mg/dL 89  87  91   BUN 8 - 23 mg/dL 24  32  27   Creatinine 0.61 - 1.24 mg/dL 1.23  1.24  1.19   BUN/Creat Ratio 10 - _0 Sodium 135 - 145 mmol/L 139  136  137   Potassium 3.5 - 5.1 mmol/L 4.4  5.0  5.5   Chloride 98 - 111 mmol/L 110  103  104   CO2 22 - 32 mmol/L _1 Calcium 8.9 - 10.3 mg/dL 8.9  8.8  9.3         Latest Ref Rng & Units 09/27/2021   10:50 AM 09/17/2021   10:35 AM 01/16/2021    7:39 AM  Hepatic Function  Total Protein 6.0 - 8.5 g/dL 6.7  6.7  6.4   Albumin 3.7 - 4.7 g/dL 3.9  3.6  3.7   AST 0  - 40 IU/L 26  21  570   ALT 0 - 44 IU/L 21  19  86   Alk Phosphatase 44 - 121 IU/L 153  121  133   Total Bilirubin 0.0 - 1.2 mg/dL 0.4  0.8  0.5        Latest Ref Rng & Units 02/23/2022    6:29 AM 09/17/2021   10:35 AM 08/25/2021   10:10 AM  CBC  WBC 4.0 - 10.5 K/uL 5.4  5.6  8.1   Hemoglobin 13.0 - 17.0 g/dL 11.5  11.6  10.0   Hematocrit 39.0 - 52.0 % 37.9  39.1  31.1   Platelets 150 - 400 K/uL 207  303  390    Lab Results  Component Value Date   MCV 86.7 02/23/2022   MCV 85.6 09/17/2021   MCV 80 08/25/2021   Lab Results  Component Value Date   TSH 5.397 (H) 09/17/2021   Lab Results  Component Value Date   HGBA1C 6.0 (H) 01/16/2021     BNP    Component Value Date/Time   BNP 824.1 (H) 09/17/2021 1905    ProBNP    Component Value Date/Time   PROBNP 3,278 (H) 09/27/2021 1050     Lipid Panel     Component Value Date/Time   CHOL 163 01/16/2021 0739   TRIG 33 01/16/2021 0739   HDL 42 01/16/2021 0739   CHOLHDL 3.9 01/16/2021 0739   VLDL 7 01/16/2021 0739   LDLCALC 114 (H) 01/16/2021 0739     RADIOLOGY: No results found.  Additional studies/ records that were reviewed today include:   I have reviewed his extensive records since his anterolateral STEMI on January 20, 2021 with subsequent echoes and evaluations.    Mid LM to Dist LM lesion is 20% stenosed.   Ost LAD to Prox LAD lesion is 100% stenosed.   Ost Cx lesion is 90% stenosed.   Ost RCA lesion is 20% stenosed.   Post intervention, there is a 60% residual stenosis.   There is moderate left ventricular systolic dysfunction.   LV end diastolic pressure is moderately elevated.   The left ventricular ejection fraction is 35-45% by visual estimate.   Acute anterolateral myocardial infarction secondary to total near ostial occlusion of a large LAD which wraps around the LV apex.   Large normal ramus intermediate vessel.   Left circumflex vessel with 85 to 90% ostial stenosis.   Large RCA with  20% irregularity proximally.   PTCA of the LAD ostium with hypotension and idioventricular rhythm with reperfusion requiring initiation of Levophed reestablishment of TIMI-3 flow to a large LAD vessel which wraps around the LV apex.   Insertion of an intra-aortic balloon pump for optimal hemodynamic support with augmented diastolic pressure at 989 millimeters mercury.   RECOMMENDATION: In light of the patient's complex anatomy, stenting was not performed due to high likelihood of jailing a very large ramus intermediate vessel and with concomitant ostial circumflex stenosis it was felt that surgical revascularization once patient stabilizes from his myocardial infarction would be optimal therapy.  He did receive 1 dose of oral Brilinta in the laboratory which will not be continued.  We will plan 2D echo Doppler today.  Initiate aggressive lipid-lowering therapy.  Dr. Kipp Brood was contacted who will see the patient and depending upon the patient;s hemodynamic stability perform CABG revascularization surgery this week following washout of his initial Brilinta dose.   Intervention  I   TEE: 09/20/2021  1. Left ventricular ejection fraction, by estimation, is 20 to 25%. The  left ventricle has severely decreased function. The left ventricle has no  regional wall motion abnormalities.   2. Right ventricular systolic function is normal. The right ventricular  size is normal.   3. No left atrial/left atrial appendage thrombus was detected.   4. PISA radius 0.4 cm      Annular diameter 3.07 cm      Mal Coaptation 0.26 cm      MR ERO 0.13 cm2      MR Vol 12 ml      Normal pulmonary vein flow. The mitral valve is degenerative. Moderate  mitral valve regurgitation. No evidence of mitral stenosis.   5. The aortic valve is normal in structure. Aortic valve regurgitation is  not visualized. No aortic stenosis is present.   6. There is mild (Grade II) plaque.   7. The inferior vena cava is normal in  size with greater than 50%  respiratory variability, suggesting right atrial pressure of 3 mmHg.    ASSESSMENT:    1. STEMI involving left anterior descending coronary artery ([place]): 01/16/2021   2. S/P CABG x 3   3. Ischemic cardiomyopathy   4. History of atrial flutter   5. Mitral valve insufficiency, unspecified etiology   6. Hyperlipidemia LDL goal <70     PLAN:  Mr. Ismeal Heider is a 76 year old retired gentleman who suffered an anterolateral myocardial infarction on January 14, 2021 leading to cardiogenic shock.  He underwent balloon angioplasty of an ostial total LAD occlusion, intra-aortic  balloon counterpulsation with plans for CABG revascularization surgery following Brilinta washout and stability.  He has been found to have a subsequent ischemic cardiomyopathy and has had issues with volume overload.  He has been transitioned to [facility] from ARB therapy and is also currently on carvedilol 6.25 mg twice a day, reduced dose Farxiga 5 mg daily, and continues to be on clopidogrel in addition to Eliquis anticoagulation.  He has LV dysfunction and his most recent echo Doppler study during his readmission for acute on chronic CHF showed EF of 26%.  He was in atrial flutter and ultimately underwent successful TEE guided cardioversion on Sep 20, 2021.  Subsequently, he has been maintaining sinus rhythm.  His last echo Doppler study was done on January 03, 2022 which continues to show EF of 35 to 40%.  With global dysfunction.  The ventricular internal cavity was moderately dilated.  It was noted that he had moderate to severe mitral regurgitation with restricted posterior leaflet.  Presently, Mr. Hilarie Fredrickson blood pressure is stable but remains low on his guideline directed medical therapy of carvedilol 6.25 mg twice a day, Farxiga at reduced dose 5 mg, Entresto 24/26 mg twice a day and spironolactone 12.5 mg every third day.  He is now 14 months following his STEMI.  He recently underwent bilateral eye  surgery and tolerated that well.  He had held his Plavix.  Presently, I have recommended he discontinue Plavix and can institute aspirin 81 mg to take in addition to Eliquis particularly with his CAD.  He is scheduled for follow-up echo Doppler study in February 2024 for reassessment of his LV function and mitral regurgitation.  Subsequent TEE may be necessary.  I will see him in March 24 for follow-up evaluation.   Medication Adjustments/Labs and Tests Ordered: Current medicines are reviewed at length with the patient today.  Concerns regarding medicines are outlined above.  Medication changes, Labs and Tests ordered today are listed in the Patient Instructions below. Patient Instructions  Medication Instructions:  STOP Plavix START baby aspirin 81 mg daily   *If you need a refill on your cardiac medications before your next appointment, please call your pharmacy*   Follow-Up: At [facility], you and your health needs are our priority.  As part of our continuing mission to provide you with exceptional heart care, we have created designated Provider Care Teams.  These Care Teams include your primary Cardiologist (physician) and Advanced Practice Providers (APPs -  Physician Assistants and Nurse Practitioners) who all work together to provide you with the care you need, when you need it.  We recommend signing up for the patient portal called "MyChart".  Sign up information is provided on this After Visit Summary.  MyChart is used to connect with patients for Virtual Visits (Telemedicine).  Patients are able to view lab/test results, encounter notes, upcoming appointments, etc.  Non-urgent messages can be sent to your provider as well.   To learn more about what you can do with MyChart, go to [email or web address].    Your next appointment:   5 month(s)  The format for your next appointment:   In Person  Provider:   Shelva Majestic, MD            Signed, Shelva Majestic, MD   03/16/2022 10:25 AM    [facility] [address] [place], [place], [place]  [address] Phone: [phone]

## 2022-03-16 ENCOUNTER — Encounter: Payer: Self-pay | Admitting: Cardiovascular Disease

## 2022-03-21 ENCOUNTER — Encounter: Payer: Self-pay | Admitting: Cardiology

## 2022-03-21 ENCOUNTER — Telehealth: Payer: Self-pay | Admitting: Cardiovascular Disease

## 2022-03-21 ENCOUNTER — Ambulatory Visit: Payer: Medicare HMO | Attending: Cardiology | Admitting: Cardiology

## 2022-03-21 VITALS — BP 128/79 | HR 66 | Ht 74.0 in | Wt 220.8 lb

## 2022-03-21 DIAGNOSIS — I25810 Atherosclerosis of coronary artery bypass graft(s) without angina pectoris: Secondary | ICD-10-CM | POA: Diagnosis not present

## 2022-03-21 DIAGNOSIS — I4892 Unspecified atrial flutter: Secondary | ICD-10-CM

## 2022-03-21 DIAGNOSIS — I959 Hypotension, unspecified: Secondary | ICD-10-CM | POA: Diagnosis not present

## 2022-03-21 DIAGNOSIS — I5042 Chronic combined systolic (congestive) and diastolic (congestive) heart failure: Secondary | ICD-10-CM | POA: Diagnosis not present

## 2022-03-21 DIAGNOSIS — R040 Epistaxis: Secondary | ICD-10-CM

## 2022-03-21 DIAGNOSIS — I255 Ischemic cardiomyopathy: Secondary | ICD-10-CM | POA: Diagnosis not present

## 2022-03-21 NOTE — Patient Instructions (Addendum)
Medication Instructions:  Your physician recommends that you continue on your current medications as directed. Please refer to the Current Medication list given to you today.  *If you need a refill on your cardiac medications before your next appointment, please call your pharmacy*   Lab Work: BMET, CBC today  If you have labs (blood work) drawn today and your tests are completely normal, you will receive your results only by: MyChart Message (if you have MyChart) OR A paper copy in the mail If you have any lab test that is abnormal or we need to change your treatment, we will call you to review the results.  Follow-Up: At [facility], you and your health needs are our priority.  As part of our continuing mission to provide you with exceptional heart care, we have created designated Provider Care Teams.  These Care Teams include your primary Cardiologist (physician) and Advanced Practice Providers (APPs -  Physician Assistants and Nurse Practitioners) who all work together to provide you with the care you need, when you need it.  We recommend signing up for the patient portal called "MyChart".  Sign up information is provided on this After Visit Summary.  MyChart is used to connect with patients for Virtual Visits (Telemedicine).  Patients are able to view lab/test results, encounter notes, upcoming appointments, etc.  Non-urgent messages can be sent to your provider as well.   To learn more about what you can do with MyChart, go to [email or web address].    Your next appointment:   AS scheduled with Dr. [name]  Recommend using humidifier at night and using nasal saline spray or put Vaseline on cotton swab and place in nose.   If you continue to have nose bleeds, call us or your primary care doctor for further recommendations.

## 2022-03-21 NOTE — Telephone Encounter (Signed)
Returned call to patient who states that he stopped the plavix and started Aspirin on 10/30. Patient states that his blood pressure has been running 80/60's. Patient states that he wasn't sure if he could trust his machine at home so he went to a clinic and had it checked and 106/53. Patient reports that he has been dizzy and unsteady on his feet. Patient states that he has also been having nose bleeds for the last several days and reports that these have lasted for a while.   Spoke with DOD- who recommends labs and to be seen today. Patient is scheduled to see Dr. Bjorn Pippin (DOD) at 3:30pm today, patient states he feels well to drive. Advised patient if dizzy he should not drive and if any symptoms to call 911. Patient aware and verbalized understanding. Lab orders placed.

## 2022-03-21 NOTE — Progress Notes (Signed)
Cardiology Office Note:    Date:  03/21/2022   ID:  Timothy Munoz, DOB [birth year], MRN [id number]  PCP:  [name], MD  Cardiologist:  [name], MD  Electrophysiologist:  None   Referring MD: [name], MD   Chief Complaint  Patient presents with   Hypotension    History of Present Illness:    Timothy Munoz is a 76 y.o. male with a hx of CAD status post CABG, chronic combined heart failure, atrial fibrillation/flutter who presents for follow-up.  He was admitted with anterolateral ST segment elevation MI in 01/2021 underwent balloon angioplasty of the ostial LAD.  Course was complicated by cardiogenic shock and required IABP for hemodynamic support.  Ultimately underwent CABG x3 01/20/2021.  Postop course was complicated by A-fib requiring amiodarone.  He was admitted in 09/2021 with decompensated heart failure, treated with IV Lasix.  Found at that time to be in atrial flutter and underwent TEE/DCCV.  EF during admission in May 2023 was 20 to 25%.  Follow-up echo 01/03/2022 showed EF 35 to 40%, moderate LV dilatation, severe left atrial enlargement, moderate to severe mitral regurgitation, moderate tricuspid regurgitation.  He was last seen by Dr. [name] on 03/12/2022.  BP 98/64 at that visit.  His Plavix was stopped, as had been over 1 year since his STEMI.  He was started on aspirin plus Eliquis.  Patient called today to report that he has been having low BP when he checks at home, down to 80s over 60s.  Also has been having issues with waking up with nosebleeds about 4 days/week.  He denies lightheadedness but reports feels a little unsteady on his feet.  He has continued to walk 1.5 miles every day, denies any issues with this.  Denies any chest pain, dyspnea, syncope, lower extremity edema.  Reports went to a clinic today to have his BP checked and was 106/53.  BP 128/79 in clinic today.   Past Medical History:  Diagnosis Date   Arthritis    Blood dyscrasia    CHF  (congestive heart failure) (HCC)    Coronary artery disease    Dysrhythmia    post-CABG PAF; aflutter, s/p DCCV 09/20/21   HFrEF (heart failure with reduced ejection fraction) (HCC)    Hyperlipidemia    mild    Hypertension    Knee pain    Myocardial infarction (HCC)    Obesity    Tremor    Tremor, essential 03/04/2019    Past Surgical History:  Procedure Laterality Date   APPENDECTOMY     BACK SURGERY     CARDIAC CATHETERIZATION     CARDIOVERSION N/A 09/20/2021   Procedure: CARDIOVERSION;  Surgeon: Jake Bathe, MD;  Location: [facility] ENDOSCOPY;  Service: Cardiovascular;  Laterality: N/A;   CORONARY ARTERY BYPASS GRAFT N/A 01/20/2021   Procedure: CORONARY ARTERY BYPASS GRAFTING (CABG) X 3 ON PUMP, USING LEFT INTERNAL MAMMARY ARTERY AND LEFT ENDOSCOPIC GREATER SAPHENOUS VEIN HARVEST CONDUITS;  Surgeon: [name], MD;  Location: [facility] OR;  Service: Open Heart Surgery;  Laterality: N/A;   CORONARY/GRAFT ACUTE MI REVASCULARIZATION N/A 01/16/2021   Procedure: Coronary/Graft Acute MI Revascularization;  Surgeon: Lennette Bihari, MD;  Location: [facility] INVASIVE CV LAB;  Service: Cardiovascular;  Laterality: N/A;   ECTROPION REPAIR Bilateral 02/23/2022   Procedure: REPAIR OF ECTROPION;  Surgeon: Dairl Ponder, MD;  Location: [facility] OR;  Service: Ophthalmology;  Laterality: Bilateral;   ENDOVEIN HARVEST OF GREATER SAPHENOUS VEIN Left 01/20/2021   Procedure:  ENDOVEIN HARVEST OF GREATER SAPHENOUS VEIN;  Surgeon: [name], MD;  Location: [facility] OR;  Service: Open Heart Surgery;  Laterality: Left;   HEMORRHOID SURGERY     IABP INSERTION N/A 01/16/2021   Procedure: IABP Insertion;  Surgeon: Lennette Bihari, MD;  Location: [facility] INVASIVE CV LAB;  Service: Cardiovascular;  Laterality: N/A;   LEFT HEART CATH AND CORONARY ANGIOGRAPHY N/A 01/16/2021   Procedure: LEFT HEART CATH AND CORONARY ANGIOGRAPHY;  Surgeon: Lennette Bihari, MD;  Location: [facility] INVASIVE CV LAB;  Service: Cardiovascular;   Laterality: N/A;   TEE WITHOUT CARDIOVERSION N/A 01/20/2021   Procedure: TRANSESOPHAGEAL ECHOCARDIOGRAM (TEE);  Surgeon: [name], MD;  Location: [facility] OR;  Service: Open Heart Surgery;  Laterality: N/A;   TEE WITHOUT CARDIOVERSION N/A 09/20/2021   Procedure: TRANSESOPHAGEAL ECHOCARDIOGRAM (TEE);  Surgeon: Jake Bathe, MD;  Location: [facility] ENDOSCOPY;  Service: Cardiovascular;  Laterality: N/A;    Current Medications: Current Meds  Medication Sig   apixaban (ELIQUIS) 5 MG TABS tablet Take 1 tablet (5 mg total) by mouth 2 (two) times daily.   aspirin EC 81 MG tablet Take 1 tablet (81 mg total) by mouth daily. Swallow whole.   carvedilol (COREG) 6.25 MG tablet Take 1 tablet (6.25 mg total) by mouth 2 (two) times daily with a meal.   dapagliflozin propanediol (FARXIGA) 10 MG TABS tablet Take 5 mg by mouth daily.   ENTRESTO 24-26 MG TAKE ONE TABLET BY MOUTH TWICE DAILY   primidone (MYSOLINE) 50 MG tablet Take 1-2 tablets daily for tremor (Patient taking differently: Take 100 mg by mouth daily.)   rosuvastatin (CRESTOR) 20 MG tablet Take 1 tablet (20 mg total) by mouth daily.   spironolactone (ALDACTONE) 25 MG tablet Take 0.5 tablet every THIRD day     Allergies:   Patient has no known allergies.   Social History   Socioeconomic History   Marital status: Single    Spouse name: Not on file   Number of children: 2   Years of education: 13   Highest education level: High school graduate  Occupational History   Occupation: retired    Comment: Pharmacist, hospital  Tobacco Use   Smoking status: Former   Smokeless tobacco: Former    Types: Chew    Quit date: 01/16/2021  Vaping Use   Vaping Use: Never used  Substance and Sexual Activity   Alcohol use: Not Currently    Comment: one beer every two months   Drug use: No   Sexual activity: Not on file  Other Topics Concern   Not on file  Social History Narrative   Right handed    Social Determinants of Health   Financial  Resource Strain: Not on file  Food Insecurity: Not on file  Transportation Needs: Not on file  Physical Activity: Not on file  Stress: Not on file  Social Connections: Not on file     Family History: The patient's family history includes Alzheimer's disease in his mother; Hypertension in his father and mother; Parkinson's disease in his mother; Suicidality in his father.  ROS:   Please see the history of present illness.     All other systems reviewed and are negative.  EKGs/Labs/Other Studies Reviewed:    The following studies were reviewed today:   EKG:   03/21/22: Sinus bradycardia, first-degree AV block, rate 58, Q waves V1-3   Recent Labs: 09/17/2021: B Natriuretic Peptide 824.1; TSH 5.397 09/18/2021: Magnesium 2.2 09/27/2021: ALT 21; NT-Pro BNP 3,278  02/23/2022: BUN 24; Creatinine, Ser 1.23; Hemoglobin 11.5; Platelets 207; Potassium 4.4; Sodium 139  Recent Lipid Panel    Component Value Date/Time   CHOL 163 01/16/2021 0739   TRIG 33 01/16/2021 0739   HDL 42 01/16/2021 0739   CHOLHDL 3.9 01/16/2021 0739   VLDL 7 01/16/2021 0739   LDLCALC 114 (H) 01/16/2021 0739    Physical Exam:    VS:  BP 128/79 (BP Location: Left Arm, Patient Position: Sitting, Cuff Size: Normal)   Pulse 66   Ht 6\' 2"  (1.88 m)   Wt 220 lb 12.8 oz (100.2 kg)   SpO2 99%   BMI 28.35 kg/m     Wt Readings from Last 3 Encounters:  03/21/22 220 lb 12.8 oz (100.2 kg)  03/12/22 221 lb 3.2 oz (100.3 kg)  02/23/22 216 lb (98 kg)     GEN:  Well nourished, well developed in no acute distress HEENT: Normal NECK: No JVD; No carotid bruits LYMPHATICS: No lymphadenopathy CARDIAC: RRR, no murmurs, rubs, gallops RESPIRATORY:  Clear to auscultation without rales, wheezing or rhonchi  ABDOMEN: Soft, non-tender, non-distended MUSCULOSKELETAL:  No edema; No deformity  SKIN: Warm and dry NEUROLOGIC:  Alert and oriented x 3 PSYCHIATRIC:  Normal affect   ASSESSMENT:    1. Hypotension, unspecified  hypotension type   2. Epistaxis   3. Coronary artery disease involving coronary bypass graft of native heart without angina pectoris   4. Atrial flutter, unspecified type (HCC)   5. Chronic combined systolic and diastolic heart failure (HCC)    PLAN:    Hypotension: Reports BP has been 80s over 60s on home monitor but had BP checked at another clinic today and was 106/53.  In clinic today it is 128/79.  He did not bring his home BP monitor to calibrate but suspect it is inaccurate.  Will provide patient with new BP monitor and encouraged to continue to check BP at home  Epistaxis: Reports he is waking up about 4 times per week with nosebleeds.  He is on aspirin plus Eliquis given his CAD and A-fib history.  Suspect this is due to nose becoming dry overnight.  Recommend sleeping with humidifier and also applying either saline nose spray or Vaseline with cotton swab to nose prior to sleeping.  If continues to have epistaxis despite this, would recommend ENT referral.  We will check CBC  CAD:  status post STEMI 01/2021.  Course was complicated by cardiogenic shock and required IABP for hemodynamic support.  Ultimately underwent CABG x3 01/20/2021.   -Continue aspirin, statin  Atrial fibrillation/flutter: Underwent TEE/DCCV 09/2021.  Continue Eliquis, carvedilol.  Chronic combined systolic and diastolic heart failure: Most recent echocardiogram 01/03/2022 showed EF 35 to 40%, moderate LV dilatation, severe left atrial enlargement, moderate to severe mitral regurgitation, moderate tricuspid regurgitation.   -Continue Coreg, Entresto, Aldactone, Farxiga.  Has repeat echocardiogram scheduled for February to monitor for improvement  RTC as scheduled with Dr. March 08/01/2022  Medication Adjustments/Labs and Tests Ordered: Current medicines are reviewed at length with the patient today.  Concerns regarding medicines are outlined above.  No orders of the defined types were placed in this encounter.  No  orders of the defined types were placed in this encounter.   Patient Instructions  Medication Instructions:  Your physician recommends that you continue on your current medications as directed. Please refer to the Current Medication list given to you today.  *If you need a refill on your cardiac medications before your next appointment,  please call your pharmacy*   Lab Work: BMET, CBC today  If you have labs (blood work) drawn today and your tests are completely normal, you will receive your results only by: MyChart Message (if you have MyChart) OR A paper copy in the mail If you have any lab test that is abnormal or we need to change your treatment, we will call you to review the results.  Follow-Up: At [facility], you and your health needs are our priority.  As part of our continuing mission to provide you with exceptional heart care, we have created designated Provider Care Teams.  These Care Teams include your primary Cardiologist (physician) and Advanced Practice Providers (APPs -  Physician Assistants and Nurse Practitioners) who all work together to provide you with the care you need, when you need it.  We recommend signing up for the patient portal called "MyChart".  Sign up information is provided on this After Visit Summary.  MyChart is used to connect with patients for Virtual Visits (Telemedicine).  Patients are able to view lab/test results, encounter notes, upcoming appointments, etc.  Non-urgent messages can be sent to your provider as well.   To learn more about what you can do with MyChart, go to [email or web address].    Your next appointment:   AS scheduled with Dr. [name]  Recommend using humidifier at night and using nasal saline spray or put Vaseline on cotton swab and place in nose.   If you continue to have nose bleeds, call us or your primary care doctor for further recommendations.         Signed, Little Ishikawa, MD  03/21/2022 4:01 PM     [facility]

## 2022-03-21 NOTE — Telephone Encounter (Signed)
Pt c/o medication issue:  1. Name of Medication:    2. How are you currently taking this medication (dosage and times per day)?    3. Are you having a reaction (difficulty breathing--STAT)? no  4. What is your medication issue?patient states being taking off of plavix has him feeling unbalance. And his bp was 106/53. Please advise

## 2022-03-22 ENCOUNTER — Other Ambulatory Visit: Payer: Self-pay | Admitting: *Deleted

## 2022-03-22 DIAGNOSIS — I5042 Chronic combined systolic (congestive) and diastolic (congestive) heart failure: Secondary | ICD-10-CM

## 2022-03-22 DIAGNOSIS — Z79899 Other long term (current) drug therapy: Secondary | ICD-10-CM

## 2022-03-22 LAB — CBC
Hematocrit: 36.2 % — ABNORMAL LOW (ref 37.5–51.0)
Hemoglobin: 11.8 g/dL — ABNORMAL LOW (ref 13.0–17.7)
MCH: 26.6 pg (ref 26.6–33.0)
MCHC: 32.6 g/dL (ref 31.5–35.7)
MCV: 82 fL (ref 79–97)
Platelets: 205 10*3/uL (ref 150–450)
RBC: 4.43 x10E6/uL (ref 4.14–5.80)
RDW: 14.7 % (ref 11.6–15.4)
WBC: 7.1 10*3/uL (ref 3.4–10.8)

## 2022-03-22 LAB — BASIC METABOLIC PANEL
BUN/Creatinine Ratio: 25 — ABNORMAL HIGH (ref 10–24)
BUN: 40 mg/dL — ABNORMAL HIGH (ref 8–27)
CO2: 22 mmol/L (ref 20–29)
Calcium: 9.1 mg/dL (ref 8.6–10.2)
Chloride: 103 mmol/L (ref 96–106)
Creatinine, Ser: 1.6 mg/dL — ABNORMAL HIGH (ref 0.76–1.27)
Glucose: 81 mg/dL (ref 70–99)
Potassium: 4.7 mmol/L (ref 3.5–5.2)
Sodium: 139 mmol/L (ref 134–144)
eGFR: 45 mL/min/{1.73_m2} — ABNORMAL LOW (ref >59)

## 2022-03-30 ENCOUNTER — Other Ambulatory Visit: Payer: Self-pay | Admitting: Physician Assistant

## 2022-04-02 DIAGNOSIS — I5042 Chronic combined systolic (congestive) and diastolic (congestive) heart failure: Secondary | ICD-10-CM | POA: Diagnosis not present

## 2022-04-02 DIAGNOSIS — Z79899 Other long term (current) drug therapy: Secondary | ICD-10-CM | POA: Diagnosis not present

## 2022-04-03 LAB — BASIC METABOLIC PANEL
BUN/Creatinine Ratio: 21 (ref 10–24)
BUN: 24 mg/dL (ref 8–27)
CO2: 22 mmol/L (ref 20–29)
Calcium: 9 mg/dL (ref 8.6–10.2)
Chloride: 105 mmol/L (ref 96–106)
Creatinine, Ser: 1.14 mg/dL (ref 0.76–1.27)
Glucose: 74 mg/dL (ref 70–99)
Potassium: 5 mmol/L (ref 3.5–5.2)
Sodium: 141 mmol/L (ref 134–144)
eGFR: 67 mL/min/{1.73_m2} (ref >59)

## 2022-04-09 ENCOUNTER — Telehealth: Payer: Self-pay | Admitting: Cardiology

## 2022-04-09 NOTE — Telephone Encounter (Signed)
Pt is requesting call back in regards to results.  

## 2022-04-09 NOTE — Telephone Encounter (Signed)
Spoke with patient regarding the following results. Patient made aware and patient verbalized understanding.    Timothy Ishikawa, MD 04/03/2022  6:18 PM EST     Kidney function improved to normal

## 2022-04-20 DIAGNOSIS — Z01818 Encounter for other preprocedural examination: Secondary | ICD-10-CM | POA: Diagnosis not present

## 2022-04-25 ENCOUNTER — Telehealth: Payer: Self-pay

## 2022-04-25 ENCOUNTER — Telehealth: Payer: Self-pay | Admitting: Cardiovascular Disease

## 2022-04-25 NOTE — Telephone Encounter (Signed)
   Pre-operative Risk Assessment    Patient Name: Timothy Munoz  DOB: 07/10/1945 MRN: [id number]      Request for Surgical Clearance    Procedure:   BLEPHAROPLASTY  BOTH EYES WITH FAT PAD UNDER  Date of Surgery:  Clearance 05/11/22                                 Surgeon:  Art Buff Surgeon's Group or Practice Name:  [facility]  Phone number:  [phone] Fax number:  [phone]    Type of Clearance Requested:   - Pharmacy:  Hold Aspirin and Apixaban (Eliquis) STOP ASA 7 DAYS BEFORE, 3 DAYS AFTER SURGERY AND ELIQUIS 3 DAYS BEFORE AND 3 DAYS AFTER SURGERY   Type of Anesthesia:  NOT LISTED   Additional requests/questions:    Lucita Ferrara   04/25/2022, 2:26 PM

## 2022-04-25 NOTE — Telephone Encounter (Signed)
Timothy Munoz with [facility] is following up regarding a clearance request that was sent to the office on 12/08. She would like to confirm that it has been received and discuss any updates.

## 2022-04-26 NOTE — Telephone Encounter (Signed)
This is a duplicate request. Please see additional clearance dated 04/25/22.

## 2022-04-26 NOTE — Telephone Encounter (Signed)
   Pre-operative Risk Assessment    Patient Name: Timothy Munoz  DOB: 09/02/1945 MRN: [id number]     Request for Surgical Clearance    Procedure:  Blepharoplasty both eyes with fat pad  Date of Surgery:  Clearance 05/11/22                                 Surgeon:  Dr. Art Buff Surgeon's Group or Practice Name:  [facility]  Phone number:  [phone] Fax number:  [phone]   Type of Clearance Requested:   - Medical  - Pharmacy:  Hold Aspirin 7 days before surgery and 3 days after  , Hold Eliquis 3 days before surgery and 3 days after    Type of Anesthesia:  Not Indicated   Additional requests/questions:    Scarlette Shorts   04/26/2022, 9:12 AM

## 2022-04-26 NOTE — Telephone Encounter (Signed)
Spoke with Duwayne Heck from requesting office and she stated that she will refax clearance. I attempted to obtain information over the phone but she stated that she was unable to obtain information at this current moment.

## 2022-04-26 NOTE — Telephone Encounter (Signed)
Dr. [name], due to the request for an extended hold time for aspirin and Eliquis, would appreciate your input regarding agreement.  Please send your response to p cv div preop  Thank you, [name], NP-C 04/26/2022, 8:06 AM [address] Office [phone] Fax [phone]

## 2022-04-26 NOTE — Telephone Encounter (Signed)
Patient with diagnosis of aflutter on Eliquis for anticoagulation.    Procedure: BLEPHAROPLASTY  BOTH EYES WITH FAT PAD UNDER  Date of procedure: 05/11/22  CHA2DS2-VASc Score = 5  This indicates a 7.2% annual risk of stroke. The patient's score is based upon: CHF History: 1 HTN History: 1 Diabetes History: 0 Stroke History: 0 Vascular Disease History: 1 Age Score: 2 Gender Score: 0   CrCl 56mL/min Platelet count 205K  Usually don't require an Eliquis hold this long for cosmetic procedure, but ok to hold Eliquis 3 days prior to procedure if really needed. Typically would resume within 1-2 days after as long as no bleeding complications.  **This guidance is not considered finalized until pre-operative APP has relayed final recommendations.**

## 2022-05-01 ENCOUNTER — Telehealth: Payer: Self-pay | Admitting: Cardiovascular Disease

## 2022-05-01 ENCOUNTER — Telehealth: Payer: Self-pay | Admitting: *Deleted

## 2022-05-01 NOTE — Telephone Encounter (Signed)
[employer] states they received the okay for holding eliquis, but they also needed clearance to hold the aspirin 7 days prior and 3 days after procedure. They also are requesting medical clearance as well.See phone notes 04/25/2022. Fax: [phone]

## 2022-05-01 NOTE — Telephone Encounter (Signed)
Pt scheduled for tele pre op appt 05/02/22 add on due to procedure date and med hold. Med rec and consent are done.      Patient Consent for Virtual Visit        Timothy Munoz has provided verbal consent on 05/01/2022 for a virtual visit (video or telephone).   CONSENT FOR VIRTUAL VISIT FOR:  Timothy Munoz  By participating in this virtual visit I agree to the following:  I hereby voluntarily request, consent and authorize [facility] and its employed or contracted physicians, physician assistants, nurse practitioners or other licensed health care professionals (the Practitioner), to provide me with telemedicine health care services (the "Services") as deemed necessary by the treating Practitioner. I acknowledge and consent to receive the Services by the Practitioner via telemedicine. I understand that the telemedicine visit will involve communicating with the Practitioner through live audiovisual communication technology and the disclosure of certain medical information by electronic transmission. I acknowledge that I have been given the opportunity to request an in-person assessment or other available alternative prior to the telemedicine visit and am voluntarily participating in the telemedicine visit.  I understand that I have the right to withhold or withdraw my consent to the use of telemedicine in the course of my care at any time, without affecting my right to future care or treatment, and that the Practitioner or I may terminate the telemedicine visit at any time. I understand that I have the right to inspect all information obtained and/or recorded in the course of the telemedicine visit and may receive copies of available information for a reasonable fee.  I understand that some of the potential risks of receiving the Services via telemedicine include:  Delay or interruption in medical evaluation due to technological equipment failure or disruption; Information transmitted  may not be sufficient (e.g. poor resolution of images) to allow for appropriate medical decision making by the Practitioner; and/or  In rare instances, security protocols could fail, causing a breach of personal health information.  Furthermore, I acknowledge that it is my responsibility to provide information about my medical history, conditions and care that is complete and accurate to the best of my ability. I acknowledge that Practitioner's advice, recommendations, and/or decision may be based on factors not within their control, such as incomplete or inaccurate data provided by me or distortions of diagnostic images or specimens that may result from electronic transmissions. I understand that the practice of medicine is not an exact science and that Practitioner makes no warranties or guarantees regarding treatment outcomes. I acknowledge that a copy of this consent can be made available to me via my patient portal [facility] MyChart), or I can request a printed copy by calling the office of [facility].    I understand that my insurance will be billed for this visit.   I have read or had this consent read to me. I understand the contents of this consent, which adequately explains the benefits and risks of the Services being provided via telemedicine.  I have been provided ample opportunity to ask questions regarding this consent and the Services and have had my questions answered to my satisfaction. I give my informed consent for the services to be provided through the use of telemedicine in my medical care  .

## 2022-05-01 NOTE — Telephone Encounter (Signed)
   Patient Name: Timothy Munoz  DOB: [birth year] MRN: [id number]  Primary Cardiologist: [name], MD  Clinical pharmacists have reviewed the patient's past medical history, labs, and current medications as part of preoperative protocol coverage. The following recommendations have been made:  Patient with diagnosis of aflutter on Eliquis for anticoagulation.     Procedure: BLEPHAROPLASTY  BOTH EYES WITH FAT PAD UNDER  Date of procedure: 05/11/22   CHA2DS2-VASc Score = 5  This indicates a 7.2% annual risk of stroke. The patient's score is based upon: CHF History: 1 HTN History: 1 Diabetes History: 0 Stroke History: 0 Vascular Disease History: 1 Age Score: 2 Gender Score: 0   CrCl 65mL/min Platelet count 205K   Usually don't require an Eliquis hold this long for cosmetic procedure, but ok to hold Eliquis 3 days prior to procedure if really needed. Typically would resume within 1-2 days after as long as no bleeding complications.  I will route this recommendation to the requesting party via Epic fax function and remove from pre-op pool.  Please call with questions.  Joylene Grapes, NP 05/01/2022, 12:30 PM

## 2022-05-01 NOTE — Telephone Encounter (Signed)
Pt scheduled for tele pre op appt 05/02/22 add on due to procedure date and med hold. Med rec and consent are done.

## 2022-05-01 NOTE — Telephone Encounter (Signed)
I will forward to preop pool to review if the pt has been cleared.

## 2022-05-01 NOTE — Telephone Encounter (Signed)
Follow Up:   Timothy Munoz is calling back from [place]. She wants to know the status of patient's clearance.please. Please fax to [phone].

## 2022-05-01 NOTE — Telephone Encounter (Signed)
   Name: Timothy Munoz  DOB: 16-Jun-1945  MRN: [id number]  Primary Cardiologist: [name], MD   Preoperative team, please contact this patient and set up a phone call appointment for further preoperative risk assessment. Please obtain consent and complete medication review. Thank you for your help.  I confirm that guidance regarding antiplatelet and oral anticoagulation therapy has been completed and, if necessary, noted below.  Usually don't require an Eliquis hold this long for cosmetic procedure, but ok to hold Eliquis 3 days prior to procedure if really needed. Typically would resume within 1-2 days after as long as no bleeding complications. Per office protocol, if patient is without any new symptoms or concerns at the time of their virtual visit, he may hold Asirin for 5-7 days prior to procedure. Please resume Aspirin as soon as possible postprocedure, at the discretion of the surgeon.     Joylene Grapes, NP 05/01/2022, 2:37 PM [facility]

## 2022-05-02 ENCOUNTER — Ambulatory Visit: Payer: Medicare HMO | Attending: Cardiovascular Disease | Admitting: Student

## 2022-05-02 DIAGNOSIS — Z01810 Encounter for preprocedural cardiovascular examination: Secondary | ICD-10-CM

## 2022-05-02 NOTE — Progress Notes (Signed)
Virtual Visit via Telephone Note   Because of Timothy Munoz's co-morbid illnesses, he is at least at moderate risk for complications without adequate follow up.  This format is felt to be most appropriate for this patient at this time.  The patient did not have access to video technology/had technical difficulties with video requiring transitioning to audio format only (telephone).  All issues noted in this document were discussed and addressed.  No physical exam could be performed with this format.  Please refer to the patient's chart for his consent to telehealth for Timothy Munoz.  Evaluation Performed:  Preoperative Cardiovascular Risk Assessment _____________   Date:  05/02/2022   Patient ID:  Timothy Munoz, DOB Aug 13, 1945, MRN [id number] Patient Location:  Home Provider location:   Office  Primary Care Provider:  [name], MD Primary Cardiologist:  Timothy Guadalajara, MD  Chief Complaint / Patient Profile   Timothy Munoz is a 76 y.o. 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  Past Medical History    Past Medical History:  Diagnosis Date   Arthritis    Blood dyscrasia    CHF (congestive heart failure) (HCC)    Coronary artery disease    Dysrhythmia    post-CABG PAF; aflutter, s/p DCCV 09/20/21   HFrEF (heart failure with reduced ejection fraction) (HCC)    Hyperlipidemia    mild    Hypertension    Knee pain    Myocardial infarction (HCC)    Obesity    Tremor    Tremor, essential 03/04/2019   Past Surgical History:  Procedure Laterality Date   APPENDECTOMY     BACK SURGERY     CARDIAC CATHETERIZATION     CARDIOVERSION N/A 09/20/2021    Procedure: CARDIOVERSION;  Surgeon: Timothy Bathe, MD;  Location: [facility] ENDOSCOPY;  Service: Cardiovascular;  Laterality: N/A;   CORONARY ARTERY BYPASS GRAFT N/A 01/20/2021   Procedure: CORONARY ARTERY BYPASS GRAFTING (CABG) X 3 ON PUMP, USING LEFT INTERNAL MAMMARY ARTERY AND LEFT ENDOSCOPIC GREATER SAPHENOUS VEIN HARVEST CONDUITS;  Surgeon: Timothy Slot, MD;  Location: [facility] OR;  Service: Open Heart Surgery;  Laterality: N/A;   CORONARY/GRAFT ACUTE MI REVASCULARIZATION N/A 01/16/2021   Procedure: Coronary/Graft Acute MI Revascularization;  Surgeon: Timothy Bihari, MD;  Location: [facility] INVASIVE CV LAB;  Service: Cardiovascular;  Laterality: N/A;   ECTROPION REPAIR Bilateral 02/23/2022   Procedure: REPAIR OF ECTROPION;  Surgeon: Timothy Ponder, MD;  Location: [facility] OR;  Service: Ophthalmology;  Laterality: Bilateral;   ENDOVEIN HARVEST OF GREATER SAPHENOUS VEIN Left 01/20/2021   Procedure: ENDOVEIN HARVEST OF GREATER SAPHENOUS VEIN;  Surgeon: Timothy Slot, MD;  Location: [facility];  Service: Open Heart Surgery;  Laterality: Left;   HEMORRHOID SURGERY     IABP INSERTION N/A 01/16/2021   Procedure: IABP Insertion;  Surgeon: Timothy Bihari, MD;  Location: [facility] INVASIVE CV LAB;  Service: Cardiovascular;  Laterality: N/A;   LEFT HEART CATH AND CORONARY ANGIOGRAPHY N/A 01/16/2021   Procedure: LEFT HEART CATH AND CORONARY ANGIOGRAPHY;  Surgeon: Timothy Bihari, MD;  Location: [facility] INVASIVE CV LAB;  Service: Cardiovascular;  Laterality: N/A;   TEE WITHOUT CARDIOVERSION N/A 01/20/2021   Procedure: TRANSESOPHAGEAL ECHOCARDIOGRAM (TEE);  Surgeon:  Timothy Slot, MD;  Location: [facility];  Service: Open Heart Surgery;  Laterality: N/A;   TEE WITHOUT CARDIOVERSION N/A 09/20/2021   Procedure: TRANSESOPHAGEAL ECHOCARDIOGRAM (TEE);  Surgeon: Timothy Bathe, MD;  Location: [facility] ENDOSCOPY;  Service: Cardiovascular;  Laterality: N/A;    Allergies  No Known Allergies  History of Present Illness    Timothy Munoz is a 76 y.o. male who presents via audio/video conferencing for a telehealth visit today.  Patient was last seen in our office on 03/21/2022 by Timothy Munoz.  At that time, he reported that his BP had been running low at home. BP was as low as the 80s/60s on home machine; However, BP was normal at 128/79 in the clinic. He denied any chest pain, dyspnea, or syncope and was staying active walking 1.5 miles per day without any issues. It was suspect that home BP machine was likely inaccurate and he was provided a new BP monitor. He also reported nosebleed multiple times a week and it was suspected that this was due to his nose becoming dry overnight. It was recommended that he sleep with a humidifier and use nasal saline spray or Vaseline. He is now pending procedure as outlined above. Since his last visit, he has done well from a cardiac standpoint. His BP is better on his new BP machine and was 128/76 this morning. He denies any chest pain, shortness of breath, orthopnea/ PND, palpitations, lightheadedness, dizziness, or syncope. He is remaining active and is still walking 1.5 miles per day without any problems.   Home Medications    Prior to Admission medications   Medication Sig Start Date End Date Taking? Authorizing Provider  apixaban (ELIQUIS) 5 MG TABS tablet Take 1 tablet (5 mg total) by mouth 2 (two) times daily. 02/26/22   Timothy Ponder, MD  aspirin EC 81 MG tablet Take 1 tablet (81 mg total) by mouth daily. Swallow whole. 03/12/22   Timothy Bihari, MD  carvedilol (COREG) 6.25 MG tablet Take 1 tablet (6.25 mg total) by mouth 2 (two) times daily with a meal. 01/24/22   Timothy Bihari, MD  dapagliflozin propanediol (FARXIGA) 10 MG TABS tablet Take 5 mg by mouth daily.    [provider]  ENTRESTO 24-26 MG TAKE ONE TABLET BY MOUTH TWICE DAILY 02/05/22   Munoz, Kardie, DO  primidone (MYSOLINE) 50 MG tablet Take 1-2 tablets daily for tremor Patient taking differently: Take 100 mg by  mouth daily. 08/31/21   Timothy Salvo, NP  rosuvastatin (CRESTOR) 20 MG tablet Take 1 tablet (20 mg total) by mouth daily. 09/27/21 09/22/22  Timothy Bihari, MD  spironolactone (ALDACTONE) 25 MG tablet Take 0.5 tablet every THIRD day 01/08/22   Azalee Course, [place]    Physical Exam    Vital Signs:  Timothy Munoz does not have vital signs available for review today.  Given telephonic nature of communication, physical exam is limited. Alert and oriented x3. No acute distress. Normal affect. Speech and respirations are unlabored.  Accessory Clinical Findings    None  Assessment & Plan    Preoperative Cardiovascular Risk Assessment: Patient has an upcoming eye procedure planned. He is doing well from a cardiac standpoint as described above. He is able to complete >4.0 METS of physical activity with angina or shortness of breath. Per Revised Cardiac Risk Index Nedra Hai Criteria), considered moderate risk risk with 6.6% of a major cardiac event perioperatively.  Based on ACC/AHA guidelines, patient would be at  acceptable risk for the planned procedure without further cardiovascular testing.   In regards to Eliquis and Aspirin: We usually don't require an Eliquis hold this long for cosmetic procedure but ok to hold Eliquis 3 days prior to procedure if really needed. Typically would resume within 1-2 days after as long as no bleeding complications. If necessary, can hold Asirin for 5-7 days prior to procedure with plans resume it as soon as possible afterwards.  A copy of this note will be routed to requesting surgeon.  Time:   Today, I have spent 5 minutes with the patient with telehealth technology discussing medical history, symptoms, and management plan.     Timothy Parker, PA-C  05/02/2022, 10:41 AM

## 2022-05-03 DIAGNOSIS — N401 Enlarged prostate with lower urinary tract symptoms: Secondary | ICD-10-CM | POA: Diagnosis not present

## 2022-05-03 DIAGNOSIS — E785 Hyperlipidemia, unspecified: Secondary | ICD-10-CM | POA: Diagnosis not present

## 2022-05-03 DIAGNOSIS — Z0000 Encounter for general adult medical examination without abnormal findings: Secondary | ICD-10-CM | POA: Diagnosis not present

## 2022-05-03 DIAGNOSIS — I10 Essential (primary) hypertension: Secondary | ICD-10-CM | POA: Diagnosis not present

## 2022-05-03 DIAGNOSIS — I4892 Unspecified atrial flutter: Secondary | ICD-10-CM | POA: Diagnosis not present

## 2022-05-03 DIAGNOSIS — D6869 Other thrombophilia: Secondary | ICD-10-CM | POA: Diagnosis not present

## 2022-05-03 DIAGNOSIS — I252 Old myocardial infarction: Secondary | ICD-10-CM | POA: Diagnosis not present

## 2022-05-03 DIAGNOSIS — Z1211 Encounter for screening for malignant neoplasm of colon: Secondary | ICD-10-CM | POA: Diagnosis not present

## 2022-05-03 DIAGNOSIS — R7303 Prediabetes: Secondary | ICD-10-CM | POA: Diagnosis not present

## 2022-05-03 DIAGNOSIS — I5022 Chronic systolic (congestive) heart failure: Secondary | ICD-10-CM | POA: Diagnosis not present

## 2022-05-11 ENCOUNTER — Encounter (HOSPITAL_COMMUNITY): Admission: RE | Payer: Self-pay | Source: Home / Self Care

## 2022-05-11 ENCOUNTER — Ambulatory Visit (HOSPITAL_COMMUNITY): Admission: RE | Admit: 2022-05-11 | Payer: Medicare HMO | Source: Home / Self Care | Admitting: Optometry

## 2022-05-11 SURGERY — BLEPHAROPLASTY
Anesthesia: General | Laterality: Bilateral

## 2022-05-16 ENCOUNTER — Other Ambulatory Visit: Payer: Self-pay | Admitting: General Practice

## 2022-05-16 ENCOUNTER — Other Ambulatory Visit: Payer: Self-pay | Admitting: Cardiology

## 2022-05-16 NOTE — Telephone Encounter (Signed)
Refills sent to  [facility], [address]

## 2022-05-17 ENCOUNTER — Other Ambulatory Visit: Payer: Self-pay | Admitting: Cardiovascular Disease

## 2022-06-01 DIAGNOSIS — H5789 Other specified disorders of eye and adnexa: Secondary | ICD-10-CM | POA: Diagnosis not present

## 2022-06-08 ENCOUNTER — Ambulatory Visit: Admit: 2022-06-08 | Payer: Medicare HMO | Admitting: Optometry

## 2022-06-08 SURGERY — BLEPHAROPLASTY
Anesthesia: General | Laterality: Bilateral

## 2022-06-19 ENCOUNTER — Ambulatory Visit (HOSPITAL_COMMUNITY): Payer: Medicare HMO | Attending: Internal Medicine

## 2022-06-19 DIAGNOSIS — I340 Nonrheumatic mitral (valve) insufficiency: Secondary | ICD-10-CM | POA: Diagnosis not present

## 2022-06-19 LAB — ECHOCARDIOGRAM COMPLETE
Area-P 1/2: 3.33 cm2
Calc EF: 46.2 %
S' Lateral: 5 cm
Single Plane A2C EF: 48.9 %
Single Plane A4C EF: 43.1 %

## 2022-06-19 MED ORDER — PERFLUTREN LIPID MICROSPHERE
3.0000 mL | INTRAVENOUS | Status: AC | PRN
  Administered 2022-06-19: 3 mL via INTRAVENOUS

## 2022-06-21 ENCOUNTER — Telehealth: Payer: Self-pay | Admitting: Cardiovascular Disease

## 2022-06-21 NOTE — Telephone Encounter (Signed)
Pt is requesting call to discuss echo results.

## 2022-06-21 NOTE — Telephone Encounter (Signed)
Spoke with pt and gave him echo results per H. Meng: "Pumping function of heart not significantly changed, still moderately low. The previously seen moderate to severe mitral valve leakage has improved. Now it is mild to moderate mitral valve leakage. There is wall motion abnormality, although not mentioned on the last echo, it was mentioned on echo in Sept 2022 after Mr. Gartman had last heart attack that required bypass surgery."  Pt wanted to know EF. by estimation, is 30 to 35%  Pt had no other questions/concerns at this time. He has OV in March with Dr. Claiborne Billings.

## 2022-07-02 DIAGNOSIS — Z1211 Encounter for screening for malignant neoplasm of colon: Secondary | ICD-10-CM | POA: Diagnosis not present

## 2022-08-09 ENCOUNTER — Ambulatory Visit: Payer: Medicare HMO | Attending: Cardiovascular Disease | Admitting: Cardiovascular Disease

## 2022-08-09 ENCOUNTER — Encounter: Payer: Self-pay | Admitting: Cardiovascular Disease

## 2022-08-09 DIAGNOSIS — E785 Hyperlipidemia, unspecified: Secondary | ICD-10-CM | POA: Diagnosis not present

## 2022-08-09 DIAGNOSIS — Z951 Presence of aortocoronary bypass graft: Secondary | ICD-10-CM

## 2022-08-09 DIAGNOSIS — I2102 ST elevation (STEMI) myocardial infarction involving left anterior descending coronary artery: Secondary | ICD-10-CM

## 2022-08-09 DIAGNOSIS — I480 Paroxysmal atrial fibrillation: Secondary | ICD-10-CM | POA: Diagnosis not present

## 2022-08-09 DIAGNOSIS — I255 Ischemic cardiomyopathy: Secondary | ICD-10-CM

## 2022-08-09 DIAGNOSIS — Z79899 Other long term (current) drug therapy: Secondary | ICD-10-CM | POA: Diagnosis not present

## 2022-08-09 DIAGNOSIS — I10 Essential (primary) hypertension: Secondary | ICD-10-CM | POA: Diagnosis not present

## 2022-08-09 DIAGNOSIS — I5042 Chronic combined systolic (congestive) and diastolic (congestive) heart failure: Secondary | ICD-10-CM | POA: Diagnosis not present

## 2022-08-09 DIAGNOSIS — I25810 Atherosclerosis of coronary artery bypass graft(s) without angina pectoris: Secondary | ICD-10-CM | POA: Diagnosis not present

## 2022-08-09 DIAGNOSIS — I340 Nonrheumatic mitral (valve) insufficiency: Secondary | ICD-10-CM

## 2022-08-09 NOTE — Patient Instructions (Addendum)
Medication Instructions:    Decrease Aspirin 81 mg to every other day  *If you need a refill on your cardiac medications before your next appointment, please call your pharmacy*   Lab Work:fasting LIPID CBC  CMP TSH LPa [id number]   If you have labs (blood work) drawn today and your tests are completely normal, you will receive your results only by: [place] (if you have MyChart) OR A paper copy in the mail If you have any lab test that is abnormal or we need to change your treatment, we will call you to review the results.   Testing/Procedures: Not  needed   Follow-Up: At [facility], you and your health needs are our priority.  As part of our continuing mission to provide you with exceptional heart care, we have created designated Provider Care Teams.  These Care Teams include your primary Cardiologist (physician) and Advanced Practice Providers (APPs -  Physician Assistants and Nurse Practitioners) who all work together to provide you with the care you need, when you need it.     Your next appointment:    7 to 8 month(s) Oct or Nov  2024  The format for your next appointment:   In Person  Provider:   Shelva Majestic, MD

## 2022-08-09 NOTE — Progress Notes (Signed)
Cardiology Office Note    Date:  08/14/2022   ID:  Timothy Munoz, DOB 1946-03-10, MRN [id number]  PCP:  Orpah Melter, MD  Cardiologist:  Shelva Majestic, MD   6 month F/u office visit with me   History of Present Illness:  Timothy Munoz is a 77 y.o. male who suffered an anterolateral ST segment elevation myocardial infarction in September 2022 and underwent initial balloon angioplasty of his ostial LAD.  His course was complicated by cardiogenic shock with reduced LV function with EF at 35%.  He required intra-aortic balloon counterpulsation for hemodynamic support and ultimately underwent CABG revascularization surgery x3 in January 20, 2021 by Dr. Roxan Hockey.  He developed postoperative atrial fibrillation requiring amiodarone and was also treated with digoxin.  An echo Doppler study on January 25, 2021 showed slight improvement in LV function with EF at 40 to 45% with grade 2 diastolic dysfunction.  He was on furosemide for volume overload.  He was subsequently seen in the office setting by Coletta Memos, NP on May 26, 2020 and his Lasix was changed to as needed.  Repeat echo Doppler study in June 06, 2021 showed EF 35 to 40% with moderate PA systolic pressure elevation at 53.4 mm, biatrial enlargement, and moderate to severe MR.  He was started on Farxiga with subsequent dose reduction due to creatinine elevation.  He was seen in April 2023 by Almyra Deforest, PA and was transition from ARB therapy to Entresto 24/26 mg twice a day.  He was hospitalized on May 7 with discharge Sep 21, 2021 increasing dyspnea and 5 pound weight gain in addition to significant lower extremity edema.  He was treated with IV furosemide and an echo showed further reduction of EF at 26% on Sep 18, 2021.  He was in atrial flutter and on May 10 underwent TEE guided cardioversion with restoration of sinus rhythm and plan for  subsequent EP evaluation was recommended with Dr. Lovena Le.  I saw him on Sep 27, 2021 at  which time he felt well and denied chest pain or shortness of breath.  He was on carvedilol 6.25 mg twice a day, Entresto 24/26 mg twice a day, as needed furosemide, Farxiga 5 mg, and is on Plaquenil 75 mg and Eliquis 5 mg twice a day.  During that evaluation, his blood pressure remained low precluding further dose titration of his guideline directed medical therapy.  Subsequent laboratory was recommended.  He saw Dr. Lovena Le on Oct 06, 2021 and given a single episode of atrial flutter he was recommended to continue monitoring.  Since I last saw him, he has been evaluated by Almyra Deforest, PA-C on several occasions with his last being in August 2023.  He had worn a monitor which showed 2 episodes of nonsustained VT the longest only 7 beats with 8 brief episodes of SVT.    Presently, I last saw him on March 12, 2022.  On February 20, 2022 he underwent bilateral eyelid surgery at [facility].  Currently he has been on Eliquis 5 mg twice a day, Plavix 75 mg daily and continues to be on Entresto 24/26 mg twice a day, Farxiga 5 mg daily, spironolactone 12.5 mg every third day.  He he is on rosuvastatin 20 mg daily.  He denies chest pain or shortness of breath.  He denies any dizziness or palpitations.  During that evaluation I had recommended that he undergo a follow-up echo Doppler study in February 2024 for reassessment of LV function and  his mitral regurgitation.  June 19, 2018 four 2D echo Doppler study demonstrated EF at 30 to 35% with severe akinesis of the mid apical anterior wall, anteroseptal wall and apical segment.  He had mild LVH with grade 2 diastolic dysfunction.  There was moderate biatrial enlargement.  He had mild to moderate mitral regurgitation with moderate mitral annular calcification.  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  Presently, he denies any chest pain or shortness of breath.  He has been experiencing intermittent rare nosebleeds.  He continues to be on Eliquis  for anticoagulation and reduced aspirin to 81 mg.  He is on carvedilol 6.25 mg twice a day, Entresto 24/26 mg twice a day, Farxiga 10 mg daily in addition to spironolactone 12.5 mg every third day.  He continues to be on rosuvastatin 20 mg daily.  He has not had recent laboratory.  He presents for reevaluation.   Past Medical History:  Diagnosis Date   Arthritis    Blood dyscrasia    CHF (congestive heart failure) (HCC)    Coronary artery disease    Dysrhythmia    post-CABG PAF; aflutter, s/p DCCV 09/20/21   HFrEF (heart failure with reduced ejection fraction) (HCC)    Hyperlipidemia    mild    Hypertension    Knee pain    Myocardial infarction ([place])    Obesity    Tremor    Tremor, essential 03/04/2019    Past Surgical History:  Procedure Laterality Date   APPENDECTOMY     BACK SURGERY     CARDIAC CATHETERIZATION     CARDIOVERSION N/A 09/20/2021   Procedure: CARDIOVERSION;  Surgeon: Jerline Pain, MD;  Location: [facility];  Service: Cardiovascular;  Laterality: N/A;   CORONARY ARTERY BYPASS GRAFT N/A 01/20/2021   Procedure: CORONARY ARTERY BYPASS GRAFTING (CABG) X 3 ON PUMP, USING LEFT INTERNAL MAMMARY ARTERY AND LEFT ENDOSCOPIC GREATER SAPHENOUS VEIN HARVEST CONDUITS;  Surgeon: Melrose Nakayama, MD;  Location: [facility];  Service: Open Heart Surgery;  Laterality: N/A;   CORONARY/GRAFT ACUTE MI REVASCULARIZATION N/A 01/16/2021   Procedure: Coronary/Graft Acute MI Revascularization;  Surgeon: Troy Sine, MD;  Location: [place] [facility];  Service: Cardiovascular;  Laterality: N/A;   ECTROPION REPAIR Bilateral 02/23/2022   Procedure: REPAIR OF ECTROPION;  Surgeon: Delia Chimes, MD;  Location: [place];  Service: Ophthalmology;  Laterality: Bilateral;   ENDOVEIN HARVEST OF GREATER SAPHENOUS VEIN Left 01/20/2021   Procedure: ENDOVEIN HARVEST OF GREATER SAPHENOUS VEIN;  Surgeon: Melrose Nakayama, MD;  Location: [place];  Service: Open Heart Surgery;  Laterality: Left;    HEMORRHOID SURGERY     IABP INSERTION N/A 01/16/2021   Procedure: IABP Insertion;  Surgeon: Troy Sine, MD;  Location: [place] [facility];  Service: Cardiovascular;  Laterality: N/A;   LEFT HEART CATH AND CORONARY ANGIOGRAPHY N/A 01/16/2021   Procedure: LEFT HEART CATH AND CORONARY ANGIOGRAPHY;  Surgeon: Troy Sine, MD;  Location: [place] [facility];  Service: Cardiovascular;  Laterality: N/A;   TEE WITHOUT CARDIOVERSION N/A 01/20/2021   Procedure: TRANSESOPHAGEAL ECHOCARDIOGRAM (TEE);  Surgeon: Melrose Nakayama, MD;  Location: [place];  Service: Open Heart Surgery;  Laterality: N/A;   TEE WITHOUT CARDIOVERSION N/A 09/20/2021   Procedure: TRANSESOPHAGEAL ECHOCARDIOGRAM (TEE);  Surgeon: Jerline Pain, MD;  Location: [facility] ENDOS[facility];  Service: Cardiovascular;  Laterality: N/A;    Current Medications: Outpatient Medications Prior to Visit  Medication Sig Dispense Refill   apixaban (  ELIQUIS) 5 MG TABS tablet Take 1 tablet (5 mg total) by mouth 2 (two) times daily. 180 tablet 3   aspirin EC 81 MG tablet Take 1 tablet (81 mg total) by mouth daily. Swallow whole. 90 tablet 3   carvedilol (COREG) 6.25 MG tablet Take 1 tablet (6.25 mg total) by mouth 2 (two) times daily with a meal. 180 tablet 1   ENTRESTO 24-26 MG TAKE ONE TABLET BY MOUTH TWICE DAILY 60 tablet 2   FARXIGA 10 MG TABS tablet TAKE ONE TABLET BY MOUTH DAILY BEFORE BREAKFAST 30 tablet 6   primidone (MYSOLINE) 50 MG tablet Take 1-2 tablets daily for tremor (Patient taking differently: Take 100 mg by mouth daily.) 180 tablet 3   rosuvastatin (CRESTOR) 20 MG tablet Take 1 tablet (20 mg total) by mouth daily. 90 tablet 3   spironolactone (ALDACTONE) 25 MG tablet Take 0.5 tablet every THIRD day 45 tablet 0   No facility-administered medications prior to visit.     Allergies:   Patient has no known allergies.   Social History   Socioeconomic History   Marital status: Single    Spouse name: Not on file   Number of children: 2    Years of education: 13   Highest education level: High school graduate  Occupational History   Occupation: retired    Comment: Clinical cytogeneticist  Tobacco Use   Smoking status: Former   Smokeless tobacco: Former    Types: Chew    Quit date: 01/16/2021  Vaping Use   Vaping Use: Never used  Substance and Sexual Activity   Alcohol use: Not Currently    Comment: one beer every two months   Drug use: No   Sexual activity: Not on file  Other Topics Concern   Not on file  Social History Narrative   Right handed    Social Determinants of Health   Financial Resource Strain: Not on file  Food Insecurity: Not on file  Transportation Needs: Not on file  Physical Activity: Not on file  Stress: Not on file  Social Connections: Not on file    He is retired and 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  He worked for over 40 years.  Prior tobacco history, quit smoking over 30 years ago  Family History:  The patient's family history includes Alzheimer's disease in his mother; Hypertension in his father and mother; Parkinson's disease in his mother; Suicidality in his father.   ROS General: Negative; No fevers, chills, or night sweats;  HEENT: Negative; No changes in vision or hearing, sinus congestion, difficulty swallowing Pulmonary: Negative; No cough, wheezing, shortness of breath, hemoptysis Cardiovascular: Negative; No chest pain, presyncope, syncope, palpitations GI: Negative; No nausea, vomiting, diarrhea, or abdominal pain GU: Negative; No dysuria, hematuria, or difficulty voiding Musculoskeletal: Negative; no myalgias, joint pain, or weakness Hematologic/Oncology: Negative; no easy bruising, bleeding Endocrine: Negative; no heat/cold intolerance; no diabetes Neuro: Negative; no changes in balance, headaches Skin: Negative; No rashes or skin lesions Psychiatric: Negative; No behavioral problems, depression Sleep: Negative; No snoring, daytime sleepiness,  hypersomnolence, bruxism, restless legs, hypnogognic hallucinations, no cataplexy Other comprehensive 14 point system review is negative.   PHYSICAL EXAM:   VS:  BP 124/74 (BP Location: Left Arm, Patient Position: Sitting, Cuff Size: Normal)   Pulse (!) 56   Ht 6\' 2"  (1.88 m)   Wt 240 lb 6.4 oz (109 kg)   SpO2 98%   BMI 30.87 kg/m     Repeat blood  pressure by me was 120/70  Wt Readings from Last 3 Encounters:  08/09/22 240 lb 6.4 oz (109 kg)  03/21/22 220 lb 12.8 oz (100.2 kg)  03/12/22 221 lb 3.2 oz (100.3 kg)   General: Alert, oriented, no distress.  Skin: normal turgor, no rashes, warm and dry HEENT: Normocephalic, atraumatic. Pupils equal round and reactive to light; sclera anicteric; extraocular muscles intact;  Nose without nasal septal hypertrophy Mouth/Parynx benign; Mallinpatti scale 3 Neck: No JVD, no carotid bruits; normal carotid upstroke Lungs: clear to ausculatation and percussion; no wheezing or rales Chest wall: without tenderness to palpitation Heart: PMI not displaced, RRR, s1 s2 normal, 1/6 systolic murmur, no diastolic murmur, no rubs, gallops, thrills, or heaves Abdomen: soft, nontender; no hepatosplenomehaly, BS+; abdominal aorta nontender and not dilated by palpation. Back: no CVA tenderness Pulses 2+ Musculoskeletal: full range of motion, normal strength, no joint deformities Extremities: no clubbing cyanosis or edema, Homan's sign negative  Neurologic: grossly nonfocal; Cranial nerves grossly wnl Psychologic: Normal mood and affect      Studies/Labs Reviewed:   August 09, 2022 vECG (independently read by me): Sinus bradycardia at 56, Q V1-2, I aVL    March 12, 2022 ECG (independently read by me): NSR at 66, QS V1-2; I,aVL; PRWP   Sep 27, 2021 ECG (independently read by me): Sinus rhythm at 70 bpm with first-degree AV block, PR interval 260 ms.  QS complex V1 through V5 consistent with prior anterior MI.  QTc increased at 473 ms.  Recent  Labs:    Latest Ref Rng & Units 04/02/2022    9:59 AM 03/21/2022    4:02 PM 02/23/2022    6:29 AM  BMP  Glucose 70 - 99 mg/dL 74  81  89   BUN 8 - 27 mg/dL 24  40  24   Creatinine 0.76 - 1.27 mg/dL 1.14  1.60  1.23   BUN/Creat Ratio 10 - 24 21  25     Sodium 134 - 144 mmol/L 141  139  139   Potassium 3.5 - 5.2 mmol/L 5.0  4.7  4.4   Chloride 96 - 106 mmol/L 105  103  110   CO2 20 - 29 mmol/L 22  22  24    Calcium 8.6 - 10.2 mg/dL 9.0  9.1  8.9         Latest Ref Rng & Units 09/27/2021   10:50 AM 09/17/2021   10:35 AM 01/16/2021    7:39 AM  Hepatic Function  Total Protein 6.0 - 8.5 g/dL 6.7  6.7  6.4   Albumin 3.7 - 4.7 g/dL 3.9  3.6  3.7   AST 0 - 40 IU/L 26  21  570   ALT 0 - 44 IU/L 21  19  86   Alk Phosphatase 44 - 121 IU/L 153  121  133   Total Bilirubin 0.0 - 1.2 mg/dL 0.4  0.8  0.5        Latest Ref Rng & Units 03/21/2022    4:02 PM 02/23/2022    6:29 AM 09/17/2021   10:35 AM  CBC  WBC 3.4 - 10.8 x10E3/uL 7.1  5.4  5.6   Hemoglobin 13.0 - 17.7 g/dL 11.8  11.5  11.6   Hematocrit 37.5 - 51.0 % 36.2  37.9  39.1   Platelets 150 - 450 x10E3/uL 205  207  303    Lab Results  Component Value Date   MCV 82 03/21/2022   MCV 86.7 02/23/2022  MCV 85.6 09/17/2021   Lab Results  Component Value Date   TSH 5.397 (H) 09/17/2021   Lab Results  Component Value Date   HGBA1C 6.0 (H) 01/16/2021     BNP    Component Value Date/Time   BNP 824.1 (H) 09/17/2021 1905    ProBNP    Component Value Date/Time   PROBNP 3,278 (H) 09/27/2021 1050     Lipid Panel     Component Value Date/Time   CHOL 163 01/16/2021 0739   TRIG 33 01/16/2021 0739   HDL 42 01/16/2021 0739   CHOLHDL 3.9 01/16/2021 0739   VLDL 7 01/16/2021 0739   LDLCALC 114 (H) 01/16/2021 0739     RADIOLOGY: No results found.   Additional studies/ records that were reviewed today include:   I have reviewed his extensive records since his anterolateral STEMI on January 20, 2021 with subsequent echoes  and evaluations.    Mid LM to Dist LM lesion is 20% stenosed.   Ost LAD to Prox LAD lesion is 100% stenosed.   Ost Cx lesion is 90% stenosed.   Ost RCA lesion is 20% stenosed.   Post intervention, there is a 60% residual stenosis.   There is moderate left ventricular systolic dysfunction.   LV end diastolic pressure is moderately elevated.   The left ventricular ejection fraction is 35-45% by visual estimate.   Acute anterolateral myocardial infarction secondary to total near ostial occlusion of a large LAD which wraps around the LV apex.   Large normal ramus intermediate vessel.   Left circumflex vessel with 85 to 90% ostial stenosis.   Large RCA with 20% irregularity proximally.   PTCA of the LAD ostium with hypotension and idioventricular rhythm with reperfusion requiring initiation of Levophed reestablishment of TIMI-3 flow to a large LAD vessel which wraps around the LV apex.   Insertion of an intra-aortic balloon pump for optimal hemodynamic support with augmented diastolic pressure at [id number] millimeters mercury.   RECOMMENDATION: In light of the patient's complex anatomy, stenting was not performed due to high likelihood of jailing a very large ramus intermediate vessel and with concomitant ostial circumflex stenosis it was felt that surgical revascularization once patient stabilizes from his myocardial infarction would be optimal therapy.  He did receive 1 dose of oral Brilinta in the laboratory which will not be continued.  We will plan 2D echo Doppler today.  Initiate aggressive lipid-lowering therapy.  Dr. Kipp Brood was contacted who will see the patient and depending upon the patient;s hemodynamic stability perform CABG revascularization surgery this week following washout of his initial Brilinta dose.   Intervention  I   TEE: 09/20/2021  1. Left ventricular ejection fraction, by estimation, is 20 to 25%. The  left ventricle has severely decreased function. The left  ventricle has no  regional wall motion abnormalities.   2. Right ventricular systolic function is normal. The right ventricular  size is normal.   3. No left atrial/left atrial appendage thrombus was detected.   4. PISA radius 0.4 cm      Annular diameter 3.07 cm      Mal Coaptation 0.26 cm      MR ERO 0.13 cm2      MR Vol 12 ml      Normal pulmonary vein flow. The mitral valve is degenerative. Moderate  mitral valve regurgitation. No evidence of mitral stenosis.   5. The aortic valve is normal in structure. Aortic valve regurgitation is  not visualized. No aortic stenosis is  present.   6. There is mild (Grade II) plaque.   7. The inferior vena cava is normal in size with greater than 50%  respiratory variability, suggesting right atrial pressure of 3 mmHg.    ASSESSMENT:    1. STEMI involving left anterior descending coronary artery ([place]): 01/16/2021   2. Coronary artery disease involving coronary bypass graft of native heart without angina pectoris   3. S/P CABG x 3   4. Ischemic cardiomyopathy   5. Chronic combined systolic and diastolic heart failure ([place])   6. Hyperlipidemia LDL goal <70   7. Primary hypertension   8. PAF (paroxysmal atrial fibrillation) ([place])   9. Medication management   10. Mitral valve insufficiency, unspecified etiology      PLAN:  Mr. Jakeob Atherton is a 77 year old retired gentleman who suffered an anterolateral myocardial infarction on January 14, 2021 leading to cardiogenic shock.  He underwent balloon angioplasty of an ostial total LAD occlusion, intra-aortic balloon counterpulsation with plans for CABG revascularization surgery following Brilinta washout and stability.  He has been found to have a subsequent ischemic cardiomyopathy and has had issues with volume overload.  He has been transitioned to [facility] from ARB therapy and is also currently on carvedilol 6.25 mg twice a day, reduced dose Farxiga 5 mg daily, and continues to be on clopidogrel  in addition to Eliquis anticoagulation.  He has LV dysfunction and his recent echo Doppler study during his readmission for acute on chronic CHF showed EF of 26%.  He was in atrial flutter and ultimately underwent successful TEE guided cardioversion on Sep 20, 2021.  Subsequently, he has been maintaining sinus rhythm.  A subsequent echo Doppler study was done on January 03, 2022 which continued to show EF of 35 to 40% with global dysfunction.  The ventricular internal cavity was moderately dilated.  It was noted that he had moderate to severe mitral regurgitation with restricted posterior leaflet.  At his October 2023 evaluation, his blood pressure was stable but remain low on his guideline directed medical therapy.  Presently, blood pressure is stable but remains low on his guideline directed medical therapy of carvedilol 6.25 mg twice a day, Farxiga at reduced dose 5 mg, Entresto 24/26 mg twice a day and spironolactone 12.5 mg every third day.  He is now 14 months following his STEMI.  He had bilateral eye surgery and tolerated that well.  He had held his Plavix.  During his October 2023 I recommended he discontinue Plavix and can institute aspirin 81 mg to take in addition to Eliquis particularly with his CAD.  He underwent a follow-up echo Doppler study for reassessment of LV function and MR on June 19, 2022.  This continues to show his ischemic cardiomyopathy with a EF stable at 30 to 35%.  There was severe akinesis of the mid apical anterior wall, anteroseptal and apical segment.  There was mild LVH and grade 2 diastolic dysfunction.  Pulmonary artery systolic pressure was mildly elevated at 36 mm.  There was moderate biatrial enlargement.  His mitral regurgitation was now felt to be mild to moderate.  Clinically he remains fairly asymptomatic on his current regimen.  He is now on carvedilol 6.25 mg twice a day, Entresto 24/26 mg twice a day, Farxiga at 10 mg in addition to spironolactone 12.5 mg every third  day.  He is on rosuvastatin 20 mg.  He has noticed intermittent rare nosebleeds.  I have suggested he reduce aspirin to 81 mg every other day.  I will schedule him to undergo complete set of fasting laboratory with a comprehensive metabolic panel, CBC, TSH, hemoglobin A1c, fasting lipid panel and will also check LP(a).  I will contact him regarding the results and adjustments will be made to his regimen if necessary.  As long as he is stable I will see him in 6 to 7 months for follow-up evaluation or sooner as needed.   Medication Adjustments/Labs and Tests Ordered: Current medicines are reviewed at length with the patient today.  Concerns regarding medicines are outlined above.  Medication changes, Labs and Tests ordered today are listed in the Patient Instructions below. Patient Instructions  Medication Instructions:    Decrease Aspirin 81 mg to every other day  *If you need a refill on your cardiac medications before your next appointment, please call your pharmacy*   Lab Work:fasting LIPID CBC  CMP TSH LPa HGBA1c   If you have labs (blood work) drawn today and your tests are completely normal, you will receive your results only by: [place] (if you have MyChart) OR A paper copy in the mail If you have any lab test that is abnormal or we need to change your treatment, we will call you to review the results.   Testing/Procedures: Not  needed   Follow-Up: At [facility], you and your health needs are our priority.  As part of our continuing mission to provide you with exceptional heart care, we have created designated Provider Care Teams.  These Care Teams include your primary Cardiologist (physician) and Advanced Practice Providers (APPs -  Physician Assistants and Nurse Practitioners) who all work together to provide you with the care you need, when you need it.     Your next appointment:    7 to 8 month(s) Oct or Nov  2024  The format for your next appointment:    In Person  Provider:   Shelva Majestic, MD       Signed, Shelva Majestic, MD  08/14/2022 5:58 PM    [facility] [address], [place], [place], [place]  [address] Phone: [phone]

## 2022-08-10 ENCOUNTER — Other Ambulatory Visit: Payer: Self-pay | Admitting: Neurology

## 2022-08-10 DIAGNOSIS — G250 Essential tremor: Secondary | ICD-10-CM

## 2022-08-13 NOTE — Telephone Encounter (Signed)
Duplicate request

## 2022-08-14 ENCOUNTER — Encounter: Payer: Self-pay | Admitting: Cardiovascular Disease

## 2022-08-15 DIAGNOSIS — I480 Paroxysmal atrial fibrillation: Secondary | ICD-10-CM | POA: Diagnosis not present

## 2022-08-15 DIAGNOSIS — I25810 Atherosclerosis of coronary artery bypass graft(s) without angina pectoris: Secondary | ICD-10-CM | POA: Diagnosis not present

## 2022-08-15 DIAGNOSIS — I10 Essential (primary) hypertension: Secondary | ICD-10-CM | POA: Diagnosis not present

## 2022-08-15 DIAGNOSIS — Z79899 Other long term (current) drug therapy: Secondary | ICD-10-CM | POA: Diagnosis not present

## 2022-08-15 DIAGNOSIS — E785 Hyperlipidemia, unspecified: Secondary | ICD-10-CM | POA: Diagnosis not present

## 2022-08-15 DIAGNOSIS — I5042 Chronic combined systolic (congestive) and diastolic (congestive) heart failure: Secondary | ICD-10-CM | POA: Diagnosis not present

## 2022-08-16 LAB — LIPOPROTEIN A (LPA): Lipoprotein (a): 192 nmol/L — ABNORMAL HIGH (ref <75.0)

## 2022-08-16 LAB — LIPID PANEL
Chol/HDL Ratio: 3.9 ratio (ref 0.0–5.0)
Cholesterol, Total: 158 mg/dL (ref 100–199)
HDL: 41 mg/dL (ref >39)
LDL Chol Calc (NIH): 102 mg/dL — ABNORMAL HIGH (ref 0–99)
Triglycerides: 80 mg/dL (ref 0–149)
VLDL Cholesterol Cal: 15 mg/dL (ref 5–40)

## 2022-08-16 LAB — CBC
Hematocrit: 41.3 % (ref 37.5–51.0)
Hemoglobin: 13.2 g/dL (ref 13.0–17.7)
MCH: 27 pg (ref 26.6–33.0)
MCHC: 32 g/dL (ref 31.5–35.7)
MCV: 85 fL (ref 79–97)
Platelets: 253 10*3/uL (ref 150–450)
RBC: 4.88 x10E6/uL (ref 4.14–5.80)
RDW: 14.6 % (ref 11.6–15.4)
WBC: 6.8 10*3/uL (ref 3.4–10.8)

## 2022-08-16 LAB — COMPREHENSIVE METABOLIC PANEL
ALT: 14 IU/L (ref 0–44)
AST: 20 IU/L (ref 0–40)
Albumin/Globulin Ratio: 1.6 (ref 1.2–2.2)
Albumin: 4.1 g/dL (ref 3.8–4.8)
Alkaline Phosphatase: 163 IU/L — ABNORMAL HIGH (ref 44–121)
BUN/Creatinine Ratio: 22 (ref 10–24)
BUN: 28 mg/dL — ABNORMAL HIGH (ref 8–27)
Bilirubin Total: 0.2 mg/dL (ref 0.0–1.2)
CO2: 21 mmol/L (ref 20–29)
Calcium: 8.9 mg/dL (ref 8.6–10.2)
Chloride: 104 mmol/L (ref 96–106)
Creatinine, Ser: 1.28 mg/dL — ABNORMAL HIGH (ref 0.76–1.27)
Globulin, Total: 2.5 g/dL (ref 1.5–4.5)
Glucose: 91 mg/dL (ref 70–99)
Potassium: 5.3 mmol/L — ABNORMAL HIGH (ref 3.5–5.2)
Sodium: 138 mmol/L (ref 134–144)
Total Protein: 6.6 g/dL (ref 6.0–8.5)
eGFR: 58 mL/min/{1.73_m2} — ABNORMAL LOW (ref >59)

## 2022-08-16 LAB — HEMOGLOBIN A1C
Est. average glucose Bld gHb Est-mCnc: 126 mg/dL
Hgb A1c MFr Bld: 6 % — ABNORMAL HIGH (ref 4.8–5.6)

## 2022-08-16 LAB — TSH: TSH: 2.03 u[IU]/mL (ref 0.450–4.500)

## 2022-08-22 ENCOUNTER — Other Ambulatory Visit: Payer: Self-pay | Admitting: Cardiovascular Disease

## 2022-08-31 ENCOUNTER — Other Ambulatory Visit: Payer: Self-pay

## 2022-08-31 DIAGNOSIS — E785 Hyperlipidemia, unspecified: Secondary | ICD-10-CM

## 2022-08-31 MED ORDER — ROSUVASTATIN CALCIUM 40 MG PO TABS: 40.0000 mg | ORAL_TABLET | Freq: Every day | ORAL | 3 refills | Status: DC

## 2022-09-04 ENCOUNTER — Ambulatory Visit: Payer: Medicare HMO | Admitting: Neurology

## 2022-09-04 ENCOUNTER — Telehealth: Payer: Self-pay | Admitting: Cardiovascular Disease

## 2022-09-04 ENCOUNTER — Encounter: Payer: Self-pay | Admitting: Neurology

## 2022-09-04 DIAGNOSIS — G250 Essential tremor: Secondary | ICD-10-CM

## 2022-09-04 MED ORDER — PRIMIDONE 50 MG PO TABS: ORAL_TABLET | ORAL | 3 refills | Status: DC

## 2022-09-04 NOTE — Telephone Encounter (Signed)
   Pre-operative Risk Assessment    Patient Name: Timothy Munoz  DOB: 24-Jun-1945 MRN: [id number]      Request for Surgical Clearance    Procedure:   Blepharoplasty Both Upperlids w/ fatpads  Date of Surgery:  Clearance 09/24/22                                 Surgeon:  Dr. Rona Ravens Group or Practice Name:  [facility] Phone number:  [phone]  Fax number:  [phone]   Type of Clearance Requested:   - Medical  - Pharmacy:  Hold Aspirin and Apixaban (Eliquis) 5 days prior to surgery   Type of Anesthesia:  Not Indicated   Additional requests/questions:  Please fax a copy of medical clearance to the surgeon's office.  Mardelle Matte   09/04/2022, 10:28 AM

## 2022-09-04 NOTE — Telephone Encounter (Signed)
   Primary Cardiologist: [name], MD  Chart reviewed as part of pre-operative protocol coverage. Given past medical history and time since last visit, based on ACC/AHA guidelines, Timothy Munoz would be at acceptable risk for the planned procedure without further cardiovascular testing.   Called patient to ensure no new concerning cardiac symptoms which he denies. He walks 1.5 miles daily with no concerning cardiac symptoms. Patient was advised that if he develops new symptoms prior to surgery to contact our office to arrange a follow-up appointment. He verbalized understanding.  Advised per office protocol, he may hold aspirin for 5 days prior to surgery.  He may hold Eliquis for 3 days prior to surgery (a 5 day hold is not necessary for washout) and he should resume each of these medications as soon as possible postoperatively, pending approval by surgeon.   I will route this recommendation to the requesting party via Epic fax function and remove from pre-op pool.  Please call with questions.  [name], NP-C  09/04/2022, 12:51 PM [address], [address] Office [phone] Fax [phone]

## 2022-09-04 NOTE — Telephone Encounter (Signed)
Surgery date is:  09/14/2022

## 2022-09-04 NOTE — Patient Instructions (Addendum)
I would stop the primidone due to potential interaction with Eliquis, monitor the tremor   See you back in 6 months

## 2022-09-04 NOTE — Telephone Encounter (Signed)
I will forward to pre op to see new notes from Dr. Darrell Jewel office today.

## 2022-09-04 NOTE — Telephone Encounter (Signed)
Patient with diagnosis of aflutter on Eliquis for anticoagulation.    Procedure: Blepharoplasty Both Upperlids w/ fatpads  Date of procedure: 09/14/22   CHA2DS2-VASc Score = 5   This indicates a 7.2% annual risk of stroke. The patient's score is based upon: CHF History: 1 HTN History: 1 Diabetes History: 0 Stroke History: 0 Vascular Disease History: 1 Age Score: 2 Gender Score: 0      CrCl 63 ml/min  Usually don't require an Eliquis hold this long for cosmetic procedure, but ok to hold Eliquis 3 days prior to procedure if really needed. Typically would resume within 1-2 days after as long as no bleeding complications.  **This guidance is not considered finalized until pre-operative APP has relayed final recommendations.**

## 2022-09-04 NOTE — Telephone Encounter (Signed)
Dr. Darcel Bayley is calling requesting call back to get clarification on the time needed to hold medication for this patient. He states that the hold time may need to be changed due to this procedure not being a cosmetic procedure and days to hold may need to be increased. Please advise.

## 2022-09-04 NOTE — Progress Notes (Signed)
Patient: Timothy Munoz Date of Birth: [birth year]  Reason for Visit: follow up for tremor History From: patient Primary Neurologist: Dr. Frances Furbish  HISTORY OF PRESENT ILLNESS: Today 09/04/22 Last visit April 2023 primidone level was 4.1. tremor is doing great. Taking primidone 50 mg, 2 pills in the morning. Easier time writing, eating with primidone. He was 1.5 miles every morning. His Crestor was increased recently. LDL 102. Scheduled for bilateral blepharoplasty 5/3.  Over the last year he has had some cardiac issues, was admitted in May 2023 for CHF with atrial flutter.  He was started on Eliquis.  Has remained on Eliquis since then.  Reportedly is not planning for ablation.  There is a potential interaction with Eliquis and primidone he was not aware of.  Update 08/31/21 SS: Timothy Munoz here today for follow-up.  Primidone level was 1.7 at last visit in October 2022. Seeing cardiology closely, BP up and down, just started Entresto, feeling real good today. Stopped losartan. Feels more energetic and stronger. Tremor right hand> left with action, affects eating at times, has not given up anything because of tremor.. Taking primidone 100 mg daily. No side effect, higher dose helps, knows nothing will take away completely. Is worse some days than others. Handwriting sample shows mild to moderate tremor translation. Has lost almost 30 lbs since October 2022, has changed eating habits.   02/28/21 ALL:  Timothy Munoz is a 77 y.o. male here today for follow up for tremor. He was last seen by Timothy Ege, NP in 08/2020. Propranolol was discontinued due to low heart rate. Primidone  daily at bedtime started. He has noticed improvement of tremor since. He is tolerating medication well with no obvious adverse effects. He is requesting to increase dose on days when tremor is worse.    He was admitted to the ER in 01/2021 for chest pain. Heart cath not successful. 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 He  is recovering fairly well. He returns to see Dr Dorris Fetch on 10/25. He also has follow up with Edd Fabian, NP with [facility] 03/16/2021.  Update 08/29/2020 SS: Timothy Munoz is a 77 year old male with history of tremor affecting both upper extremities.  His mother had Alzheimer's disease and Parkinson's disease with tremor.  His tremor worsens over time, has tremor with action, most problematic with handwriting, eating or drinking.  Tremor is greater in the right hand than the left.  When taking Xanax twice daily, resulted in drowsiness.  Currently on propanolol 20 mg in AM, thinks it is somewhat helpful, but heart rate is 51.  He lives alone, remains active.  Is retired.  Is due to see his primary care doctor.  Has had blood work done recently, reports was normal.  Here today for evaluation unaccompanied.  Update 02/29/2020 SS: Timothy Munoz is a 77 year old male with history of tremor affecting both upper extremities.  Reportedly his mother had Alzheimer's disease, and Parkinson's disease with tremor.  He takes Xanax as needed for tremor, never more than once a day, but he thinks he helps when he takes it.  The tremor is gradually progressing over time.  There are times he has significant difficulty with eating, stopped for spaghetti recently, had terrible time.  Sometimes holding the steering well with his right hand, will notice tremoring, or holding something with a tight grip, affects handwriting.  Tremor more in the right hand than left.  No falls, balance is okay.  He is retired, but remains active.  His overall health is good.  Presents today for evaluation unaccompanied.  HISTORY 03/04/2019 Dr. Anne Hahn: Timothy Munoz is a 77 year old right-handed white male with a history of a tremor affecting both upper extremities for greater than 17 years.  The patient claims that his mother had Alzheimer's disease but she also had tremor but was told she had Parkinson's disease.  He knows of no other family members  who have had tremor, he has 1 sister.  The patient indicates that over time, the tremor has gradually worsened.  He is having increasing problems with handwriting and with feeding himself, he likes to do a lot of fishing but has trouble tying the knots on the fishing line.  The patient does not have any tremor when resting the arms.  He reports no tremors associated with the head or neck or with the voice.  He reports some mild gait instability problems but no problems with falling.  He does have urinary frequency but no incontinence.  He reports no significant numbness or weakness of the extremities.  He comes to this office for an evaluation.   REVIEW OF SYSTEMS: Out of a complete 14 system review of symptoms, the patient complains only of the following symptoms, and all other reviewed systems are negative.  See HPI  ALLERGIES: No Known Allergies  HOME MEDICATIONS: Outpatient Medications Prior to Visit  Medication Sig Dispense Refill   apixaban (ELIQUIS) 5 MG TABS tablet Take 1 tablet (5 mg total) by mouth 2 (two) times daily. 180 tablet 3   aspirin EC 81 MG tablet Take 1 tablet (81 mg total) by mouth daily. Swallow whole. (Patient taking differently: Take 81 mg by mouth every other day. Swallow whole.) 90 tablet 3   carvedilol (COREG) 6.25 MG tablet Take 1 tablet (6.25 mg total) by mouth 2 (two) times daily with a meal. 180 tablet 1   ENTRESTO 24-26 MG TAKE ONE TABLET BY MOUTH TWICE DAILY 60 tablet 2   FARXIGA 10 MG TABS tablet TAKE ONE TABLET BY MOUTH DAILY BEFORE BREAKFAST (Patient taking differently: Take 10 mg by mouth 2 (two) times daily.) 30 tablet 6   primidone (MYSOLINE) 50 MG tablet Take 1-2 tablets daily for tremor (Patient taking differently: Take 100 mg by mouth daily.) 180 tablet 3   rosuvastatin (CRESTOR) 40 MG tablet Take 1 tablet (40 mg total) by mouth daily. 90 tablet 3   spironolactone (ALDACTONE) 25 MG tablet Take 0.5 tablet every THIRD day 45 tablet 0   No  facility-administered medications prior to visit.    PAST MEDICAL HISTORY: Past Medical History:  Diagnosis Date   Arthritis    Blood dyscrasia    CHF (congestive heart failure)    Coronary artery disease    Dysrhythmia    post-CABG PAF; aflutter, s/p DCCV 09/20/21   HFrEF (heart failure with reduced ejection fraction)    Hyperlipidemia    mild    Hypertension    Knee pain    Myocardial infarction    Obesity    Tremor    Tremor, essential 03/04/2019    PAST SURGICAL HISTORY: Past Surgical History:  Procedure Laterality Date   APPENDECTOMY     BACK SURGERY     CARDIAC CATHETERIZATION     CARDIOVERSION N/A 09/20/2021   Procedure: CARDIOVERSION;  Surgeon: Jake Bathe, MD;  Location: [facility] ENDOSCOPY;  Service: Cardiovascular;  Laterality: N/A;   CORONARY ARTERY BYPASS GRAFT N/A 01/20/2021   Procedure: CORONARY ARTERY BYPASS GRAFTING (CABG) X 3 ON  PUMP, USING LEFT INTERNAL MAMMARY ARTERY AND LEFT ENDOSCOPIC GREATER SAPHENOUS VEIN HARVEST CONDUITS;  Surgeon: [name], MD;  Location[facility] OR;  Service: Open Heart Surgery;  Laterality: N/A;   CORONARY/GRAFT ACUTE MI REVASCULARIZATION N/A 01/16/2021   Procedure: Coronary/Graft Acute MI Revascularization;  Surgeon: Lennette Bihari, MD;  Location: [facility] INVASIVE CV LAB;  Service: Cardiovascular;  Laterality: N/A;   ECTROPION REPAIR Bilateral 02/23/2022   Procedure: REPAIR OF ECTROPION;  Surgeon: Dairl Ponder, MD;  Location: [facility] OR;  Service: Ophthalmology;  Laterality: Bilateral;   ENDOVEIN HARVEST OF GREATER SAPHENOUS VEIN Left 01/20/2021   Procedure: ENDOVEIN HARVEST OF GREATER SAPHENOUS VEIN;  Surgeon: [name], MD;  Location: [facility] OR;  Service: Open Heart Surgery;  Laterality: Left;   HEMORRHOID SURGERY     IABP INSERTION N/A 01/16/2021   Procedure: IABP Insertion;  Surgeon: Lennette Bihari, MD;  Location: [facility] INVASIVE CV LAB;  Service: Cardiovascular;  Laterality: N/A;   LEFT HEART CATH AND CORONARY ANGIOGRAPHY  N/A 01/16/2021   Procedure: LEFT HEART CATH AND CORONARY ANGIOGRAPHY;  Surgeon: Lennette Bihari, MD;  Location: [facility] INVASIVE CV LAB;  Service: Cardiovascular;  Laterality: N/A;   TEE WITHOUT CARDIOVERSION N/A 01/20/2021   Procedure: TRANSESOPHAGEAL ECHOCARDIOGRAM (TEE);  Surgeon: [name], MD;  Location: [facility] OR;  Service: Open Heart Surgery;  Laterality: N/A;   TEE WITHOUT CARDIOVERSION N/A 09/20/2021   Procedure: TRANSESOPHAGEAL ECHOCARDIOGRAM (TEE);  Surgeon: Jake Bathe, MD;  Location: [facility] ENDOSCOPY;  Service: Cardiovascular;  Laterality: N/A;    FAMILY HISTORY: Family History  Problem Relation Age of Onset   Hypertension Mother    Parkinson's disease Mother    Alzheimer's disease Mother    Hypertension Father    Suicidality Father     SOCIAL HISTORY: Social History   Socioeconomic History   Marital status: Single    Spouse name: Not on file   Number of children: 2   Years of education: 13   Highest education level: High school graduate  Occupational History   Occupation: retired    Comment: Pharmacist, hospital  Tobacco Use   Smoking status: Former   Smokeless tobacco: Former    Types: Chew    Quit date: 01/16/2021  Vaping Use   Vaping Use: Never used  Substance and Sexual Activity   Alcohol use: Not Currently    Comment: one beer every two months   Drug use: No   Sexual activity: Not on file  Other Topics Concern   Not on file  Social History Narrative   Right handed    Social Determinants of Health   Financial Resource Strain: Not on file  Food Insecurity: Not on file  Transportation Needs: Not on file  Physical Activity: Not on file  Stress: Not on file  Social Connections: Not on file  Intimate Partner Violence: Not on file   PHYSICAL EXAM  Vitals:   09/04/22 0810  BP: 105/64  Pulse: 64  Weight: 236 lb (107 kg)  Height:  (1.88 m)   Body mass index is 30.3 kg/m. Generalized: Well developed, in no acute distress  Neurological  examination  Mentation: Alert oriented to time, place, history taking. Follows all commands speech and language fluent Cranial nerve II-XII: Pupils were equal round reactive to light. Extraocular movements were full, visual field were full on confrontational test. Facial sensation and strength were normal.  Head turning and shoulder shrug  were normal and symmetric. Motor: The motor testing reveals 5 over  5 strength of all 4 extremities. Good symmetric motor tone is noted throughout.  No bradykinesia. Sensory: Sensory testing is intact to soft touch on all 4 extremities. No evidence of extinction is noted.  Coordination: Cerebellar testing reveals good finger-nose-finger and heel-to-shin bilaterally.  Mild to moderate intention tremor with finger-nose-finger bilaterally.  Tremor is translated mild to moderate into handwriting sample and spiral drawl. Gait and station: Gait is normal, good strides, turns, no tremor noted.  Independent.  DIAGNOSTIC DATA (LABS, IMAGING, TESTING) - I reviewed patient records, labs, notes, testing and imaging myself where available.  Lab Results  Component Value Date   WBC 6.8 08/15/2022   HGB 13.2 08/15/2022   HCT 41.3 08/15/2022   MCV 85 08/15/2022   PLT 253 08/15/2022      Component Value Date/Time   NA 138 08/15/2022 0808   K 5.3 (H) 08/15/2022 0808   CL 104 08/15/2022 0808   CO2 21 08/15/2022 0808   GLUCOSE 91 08/15/2022 0808   GLUCOSE 89 02/23/2022 0629   BUN 28 (H) 08/15/2022 0808   CREATININE 1.28 (H) 08/15/2022 0808   CREATININE 1.17 12/24/2016 1015   CALCIUM 8.9 08/15/2022 0808   PROT 6.6 08/15/2022 0808   ALBUMIN 4.1 08/15/2022 0808   AST 20 08/15/2022 0808   ALT 14 08/15/2022 0808   ALKPHOS 163 (H) 08/15/2022 0808   BILITOT 0.2 08/15/2022 0808   GFRNONAA >60 02/23/2022 0629   Lab Results  Component Value Date   CHOL 158 08/15/2022   HDL 41 08/15/2022   LDLCALC 102 (H) 08/15/2022   TRIG 80 08/15/2022   CHOLHDL 3.9 08/15/2022   Lab  Results  Component Value Date   HGBA1C 6.0 (H) 08/15/2022   No results found for: "VITAMINB12" Lab Results  Component Value Date   TSH 2.030 08/15/2022   ASSESSMENT AND PLAN 77 y.o. year old male  has a past medical history of Arthritis, Blood dyscrasia, CHF (congestive heart failure), Coronary artery disease, Dysrhythmia, HFrEF (heart failure with reduced ejection fraction), Hyperlipidemia, Hypertension, Knee pain, Myocardial infarction, Obesity, Tremor, and Tremor, essential (03/04/2019). here with:  1.  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  Primidone has potential to lessen the efficacy of Eliquis.  We discussed this.  He will stop the primidone.  We will monitor tremor. -In the past, propanolol was discontinued due to bradycardia, had drowsiness with Xanax -Follow-up with me in 6 months to check on the tremor  Timothy Munoz, AGNP-C, DNP 09/04/2022, 8:26 AM [facility] [address] [place], [place] [address] [phone]

## 2022-09-05 NOTE — Telephone Encounter (Signed)
Faxed over pharm d recommendations to [facility]

## 2022-09-05 NOTE — Telephone Encounter (Signed)
3 day DOAC hold still plenty long for procedure, no change in rec needed.

## 2022-09-06 ENCOUNTER — Other Ambulatory Visit: Payer: Self-pay

## 2022-09-06 DIAGNOSIS — E785 Hyperlipidemia, unspecified: Secondary | ICD-10-CM

## 2022-09-07 ENCOUNTER — Telehealth: Payer: Self-pay | Admitting: Cardiovascular Disease

## 2022-09-07 LAB — COMPREHENSIVE METABOLIC PANEL
ALT: 16 IU/L (ref 0–44)
Albumin/Globulin Ratio: 1.8 (ref 1.2–2.2)
Glucose: 87 mg/dL (ref 70–99)
Potassium: 5.4 mmol/L — ABNORMAL HIGH (ref 3.5–5.2)
Sodium: 141 mmol/L (ref 134–144)
Total Protein: 6.8 g/dL (ref 6.0–8.5)
eGFR: 59 mL/min/{1.73_m2} — ABNORMAL LOW (ref >59)

## 2022-09-07 NOTE — Telephone Encounter (Signed)
I spoke with Dr. Darcel Bayley today and clarified that aspirin may be held up to 7 days per his request.   [name], NP-C  09/07/2022, 4:20 PM [address], [address] Office [phone] Fax [phone]

## 2022-09-07 NOTE — Telephone Encounter (Signed)
Called Dr. Darcel Bayley who called to ask if aspirin can be held for 7 days instead of 5. I advised patient may hold aspirin for 7 days. I will fax the original clearance with this clarification. He thanked me for the call.

## 2022-09-07 NOTE — Telephone Encounter (Signed)
Dr. Dairl Ponder is requesting for NP Eligha Bridegroom to give him a call about the patient. Dr. Debbe Bales phone number is [phone]

## 2022-09-08 LAB — COMPREHENSIVE METABOLIC PANEL
AST: 20 IU/L (ref 0–40)
Albumin: 4.4 g/dL (ref 3.8–4.8)
Alkaline Phosphatase: 167 IU/L — ABNORMAL HIGH (ref 44–121)
BUN/Creatinine Ratio: 21 (ref 10–24)
BUN: 26 mg/dL (ref 8–27)
Bilirubin Total: 0.3 mg/dL (ref 0.0–1.2)
CO2: 19 mmol/L — ABNORMAL LOW (ref 20–29)
Calcium: 9.3 mg/dL (ref 8.6–10.2)
Chloride: 108 mmol/L — ABNORMAL HIGH (ref 96–106)
Creatinine, Ser: 1.26 mg/dL (ref 0.76–1.27)
Globulin, Total: 2.4 g/dL (ref 1.5–4.5)

## 2022-09-08 LAB — LIPID PANEL
Chol/HDL Ratio: 4.1 ratio (ref 0.0–5.0)
Cholesterol, Total: 145 mg/dL (ref 100–199)
HDL: 35 mg/dL — ABNORMAL LOW (ref >39)
LDL Chol Calc (NIH): 92 mg/dL (ref 0–99)
Triglycerides: 96 mg/dL (ref 0–149)
VLDL Cholesterol Cal: 18 mg/dL (ref 5–40)

## 2022-09-11 NOTE — Telephone Encounter (Signed)
Refaxed over a copy of clearance note to [facility].

## 2022-09-11 NOTE — Telephone Encounter (Signed)
Caller stated they received verbal to hold aspirin for 7 days instead of 5 but will need this information in writing.  Caller stated written information can be faxed to fax# [phone].

## 2022-09-12 ENCOUNTER — Other Ambulatory Visit: Payer: Self-pay

## 2022-09-12 ENCOUNTER — Encounter (HOSPITAL_COMMUNITY): Payer: Self-pay | Admitting: Optometry

## 2022-09-12 NOTE — Progress Notes (Signed)
PCP - [name], MD Cardiologist - [name], MD  PPM/ICD - denies  EKG - 08/09/22 ECHO - 06/19/22 Cardiac Cath - 01/16/21  CPAP - n/a  Fasting Blood Sugar - n/a  Blood Thinner Instructions: Eliquis - last dose - 09/10/22 per patient Aspirin - last dose - 09/06/22 per patient Patient was instructed: As of today, STOP taking any Aspirin (unless otherwise instructed by your surgeon) Aleve, Naproxen, Ibuprofen, Motrin, Advil, Goody's, BC's, all herbal medications, fish oil, and all vitamins.  ERAS Protcol - n/a  COVID TEST- n/a  Anesthesia review: yes  Patient verbally denies any shortness of breath, fever, cough and chest pain during phone call   -------------  SDW INSTRUCTIONS given:  Your procedure is scheduled on Friday, May 3rd, 2024.  Report to [facility] Main Entrance "A" at 09:00 A.M., and check in at the Admitting office.  Call this number if you have problems the morning of surgery:  [phone]   Remember:  Do not eat or drink after midnight the night before your surgery    Take these medicines the morning of surgery with A SIP OF WATER: Coreg, Crestor  Hold Farxiga 72 hrs prior your surgery   The day of surgery:                     Do not wear jewelry,             Do not wear lotions, powders, colognes, or deodorant.            Men may shave face and neck.            Do not bring valuables to the hospital.            [facility] is not responsible for any belongings or valuables.  Do NOT Smoke (Tobacco/Vaping) 24 hours prior to your procedure If you use a CPAP at night, you may bring all equipment for your overnight stay.   Contacts, glasses, dentures or bridgework may not be worn into surgery.      For patients admitted to the hospital, discharge time will be determined by your treatment team.   Patients discharged the day of surgery will not be allowed to drive home, and someone needs to stay with them for 24 hours.    Special  instructions:   [facility]- Preparing For Surgery  Before surgery, you can play an important role. Because skin is not sterile, your skin needs to be as free of germs as possible. You can reduce the number of germs on your skin by washing with CHG (chlorahexidine gluconate) Soap before surgery.  CHG is an antiseptic cleaner which kills germs and bonds with the skin to continue killing germs even after washing.    Oral Hygiene is also important to reduce your risk of infection.  Remember - BRUSH YOUR TEETH THE MORNING OF SURGERY WITH YOUR REGULAR TOOTHPASTE  Please do not use if you have an allergy to CHG or antibacterial soaps. If your skin becomes reddened/irritated stop using the CHG.  Do not shave (including legs and underarms) for at least 48 hours prior to first CHG shower. It is OK to shave your face.  Please follow these instructions carefully.   Shower the NIGHT BEFORE SURGERY and the MORNING OF SURGERY with DIAL Soap.   Pat yourself dry with a CLEAN TOWEL.  Wear CLEAN PAJAMAS to bed the night before surgery  Place CLEAN SHEETS on your bed the night  of your first shower and DO NOT SLEEP WITH PETS.   Day of Surgery: Please shower morning of surgery  Wear Clean/Comfortable clothing the morning of surgery Do not apply any deodorants/lotions.   Remember to brush your teeth WITH YOUR REGULAR TOOTHPASTE.   Questions were answered. Patient verbalized understanding of instructions.

## 2022-09-13 NOTE — Anesthesia Preprocedure Evaluation (Addendum)
Anesthesia Evaluation  Patient identified by MRN, date of birth, ID band Patient awake    Reviewed: Allergy & Precautions, NPO status , Patient's Chart, lab work & pertinent test results, reviewed documented beta blocker date and time   History of Anesthesia Complications Negative for: history of anesthetic complications  Airway Mallampati: II  TM Distance: >3 FB Neck ROM: Full    Dental  (+) Missing,    Pulmonary former smoker   Pulmonary exam normal        Cardiovascular hypertension, Pt. on medications and Pt. on home beta blockers + CAD, + Past MI, + CABG and +CHF  Normal cardiovascular exam+ dysrhythmias (on Eliquis) Atrial Fibrillation   TTE 06/19/22: EF 30-35%, moderate LVE, mild LVH, grade 2 DD, severe akinesis of the left ventricular, mid-apical anterior wall, anteroseptal wall and apical segment, mildly elevated PASP 36.6 mmHg, moderate LAE/RAE, mild to moderate MR, mild dilatation of ascending aorta measuring 44mm    Neuro/Psych negative neurological ROS     GI/Hepatic negative GI ROS, Neg liver ROS,,,  Endo/Other  negative endocrine ROS    Renal/GU Renal InsufficiencyRenal disease     Musculoskeletal  (+) Arthritis ,    Abdominal   Peds  Hematology  (+) Blood dyscrasia   Anesthesia Other Findings 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  Reproductive/Obstetrics                              Anesthesia Physical Anesthesia Plan  ASA: 3  Anesthesia Plan: MAC   Post-op Pain Management: Tylenol PO (pre-op)*   Induction: Intravenous  PONV Risk Score and Plan: 1 and Treatment may vary due to age or medical condition and Propofol infusion  Airway Management Planned: Natural Airway and Simple Face Mask  Additional Equipment:   Intra-op Plan:   Post-operative Plan:   Informed Consent: I have reviewed the patients History and Physical, chart, labs and discussed the  procedure including the risks, benefits and alternatives for the proposed anesthesia with the patient or authorized representative who has indicated his/her understanding and acceptance.       Plan Discussed with: CRNA  Anesthesia Plan Comments: (PAT note by Antionette Poles, PA-C: Follows cardiology for history of a flutter s/p cardioversion 09/2021, combined heart failure with EF 30 to 35%, CAD s/p anterolateral STEMI 01/2021 treated with balloon angioplasty of ostial LAD. His course was complicated by cardiogenic shock with reduced LV function with EF at 35%. He required intra-aortic balloon counterpulsation for hemodynamic support and ultimately underwent CABG revascularization surgery x3 in January 20, 2021 by Dr. Dorris Fetch. He developed postoperative atrial fibrillation requiring amiodarone and was also treated with digoxin.  Cardiac clearance per telephone encounter 09/04/2022, "Chart reviewed as part of pre-operative protocol coverage. Given past medical history and time since last visit, based on ACC/AHA guidelines, Timothy Munoz would be at acceptable risk for the planned procedure without further cardiovascular testing. Called patient to ensure no new concerning cardiac symptoms which he denies. He walks 1.5 miles daily with no concerning cardiac symptoms. Patient was advised that if he develops new symptoms prior to surgery to contact our office to arrange a follow-up appointment. He verbalized understanding.Advised per office protocol, he may hold aspirin for 5 days prior to surgery.  He may hold Eliquis for 3 days prior to surgery (a 5 day hold is not necessary for washout) and he should resume each of these medications as soon as possible postoperatively,  pending approval by surgeon."  Patient was subsequently cleared to hold aspirin for 7 days in telephone encounter 09/07/2022.  Patient reports last dose Eliquis 09/10/2022.  Last dose aspirin 09/06/2022.  CMP 10/06/2022 reviewed, potassium  mildly elevated at 5.4, otherwise unremarkable.  CBC 08/15/2022 reviewed, WNL.  EKG 08/09/2022: Sinus bradycardia with first-degree AV block.  Rate 56.  Septal infarct, age undetermined.  Lateral infarct, age uncertain.  TTE 06/19/2022:  1. No apical thrombus with Definity contrast. Left ventricular ejection  fraction, by estimation, is 30 to 35%. The left ventricle has moderately  decreased function. The left ventricle demonstrates regional wall motion  abnormalities (see scoring  diagram/findings for description). The left ventricular internal cavity  size was moderately dilated. There is mild left ventricular hypertrophy.  Left ventricular diastolic parameters are consistent with Grade II  diastolic dysfunction  (pseudonormalization). There is severe akinesis of the left ventricular,  mid-apical anterior wall, anteroseptal wall and apical segment.   2. Right ventricular systolic function is normal. The right ventricular  size is normal. There is mildly elevated pulmonary artery systolic  pressure. The estimated right ventricular systolic pressure is 36.6 mmHg.   3. Left atrial size was moderately dilated.   4. Right atrial size was moderately dilated.   5. The mitral valve is abnormal. Mild to moderate mitral valve  regurgitation. Moderate mitral annular calcification.   6. The aortic valve is tricuspid. Aortic valve regurgitation is not  visualized.   7. Aortic dilatation noted. There is mild dilatation of the ascending  aorta, measuring 44 mm.   8. The inferior vena cava is normal in size with greater than 50%  respiratory variability, suggesting right atrial pressure of 3 mmHg.   Comparison(s): Changes from prior study are noted. 01/03/2022: LVEF 35-40%.   )         Anesthesia Quick Evaluation

## 2022-09-13 NOTE — Progress Notes (Signed)
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 His course was complicated by cardiogenic shock with reduced LV function with EF at 35%. He required intra-aortic balloon counterpulsation for hemodynamic support and ultimately underwent CABG revascularization surgery x3 in January 20, 2021 by Dr. Dorris Fetch. He developed postoperative atrial fibrillation requiring amiodarone and was also treated with digoxin.  Cardiac clearance per telephone encounter 09/04/2022, "Chart reviewed as part of pre-operative protocol coverage. Given past medical history and time since last visit, based on ACC/AHA guidelines, Timothy Munoz would be at acceptable risk for the planned procedure without further cardiovascular testing. Called patient to ensure no new concerning cardiac symptoms which he denies. He walks 1.5 miles daily with no concerning cardiac symptoms. Patient was advised that if he develops new symptoms prior to surgery to contact our office to arrange a follow-up appointment. He verbalized understanding.Advised per office protocol, he may hold aspirin for 5 days prior to surgery.  He may hold Eliquis for 3 days prior to surgery (a 5 day hold is not necessary for washout) and he should resume each of these medications as soon as possible postoperatively, pending approval by surgeon."  Patient was subsequently cleared to hold aspirin for 7 days in telephone encounter 09/07/2022.  Patient reports last dose Eliquis 09/10/2022.  Last dose aspirin 09/06/2022.  CMP 10/06/2022 reviewed, potassium mildly elevated at 5.4, otherwise unremarkable.  CBC 08/15/2022 reviewed, WNL.  EKG 08/09/2022: Sinus bradycardia with first-degree AV block.  Rate 56.  Septal infarct, age undetermined.  Lateral infarct, age uncertain.  TTE 06/19/2022:  1. No  apical thrombus with Definity contrast. Left ventricular ejection  fraction, by estimation, is 30 to 35%. The left ventricle has moderately  decreased function. The left ventricle demonstrates regional wall motion  abnormalities (see scoring  diagram/findings for description). The left ventricular internal cavity  size was moderately dilated. There is mild left ventricular hypertrophy.  Left ventricular diastolic parameters are consistent with Grade II  diastolic dysfunction  (pseudonormalization). There is severe akinesis of the left ventricular,  mid-apical anterior wall, anteroseptal wall and apical segment.   2. Right ventricular systolic function is normal. The right ventricular  size is normal. There is mildly elevated pulmonary artery systolic  pressure. The estimated right ventricular systolic pressure is 36.6 mmHg.   3. Left atrial size was moderately dilated.   4. Right atrial size was moderately dilated.   5. The mitral valve is abnormal. Mild to moderate mitral valve  regurgitation. Moderate mitral annular calcification.   6. The aortic valve is tricuspid. Aortic valve regurgitation is not  visualized.   7. Aortic dilatation noted. There is mild dilatation of the ascending  aorta, measuring 44 mm.   8. The inferior vena cava is normal in size with greater than 50%  respiratory variability, suggesting right atrial pressure of 3 mmHg.   Comparison(s): Changes from prior study are noted. 01/03/2022: LVEF 35-40%.     Zannie Cove [facility] Stay [facility]/Anesthesiology Phone [phone] 09/13/2022 9:52 AM

## 2022-09-14 ENCOUNTER — Encounter (HOSPITAL_COMMUNITY)
Admission: RE | Disposition: A | Discharge status: Home / Self Care | Payer: Self-pay | Source: Ambulatory Visit | Attending: Optometry

## 2022-09-14 ENCOUNTER — Ambulatory Visit (HOSPITAL_BASED_OUTPATIENT_CLINIC_OR_DEPARTMENT_OTHER): Payer: Medicare HMO | Admitting: Physician Assistant

## 2022-09-14 ENCOUNTER — Ambulatory Visit (HOSPITAL_COMMUNITY)
Admission: RE | Admit: 2022-09-14 | Discharge: 2022-09-14 | Disposition: A | Discharge status: Home / Self Care | Payer: Medicare HMO | Source: Ambulatory Visit | Attending: Optometry | Admitting: Optometry

## 2022-09-14 ENCOUNTER — Encounter (HOSPITAL_COMMUNITY): Payer: Self-pay | Admitting: Optometry

## 2022-09-14 ENCOUNTER — Other Ambulatory Visit: Payer: Self-pay

## 2022-09-14 ENCOUNTER — Ambulatory Visit (HOSPITAL_COMMUNITY): Payer: Medicare HMO | Admitting: Physician Assistant

## 2022-09-14 DIAGNOSIS — I4891 Unspecified atrial fibrillation: Secondary | ICD-10-CM | POA: Insufficient documentation

## 2022-09-14 DIAGNOSIS — D759 Disease of blood and blood-forming organs, unspecified: Secondary | ICD-10-CM | POA: Diagnosis not present

## 2022-09-14 DIAGNOSIS — H02834 Dermatochalasis of left upper eyelid: Secondary | ICD-10-CM | POA: Insufficient documentation

## 2022-09-14 DIAGNOSIS — Z87891 Personal history of nicotine dependence: Secondary | ICD-10-CM | POA: Diagnosis not present

## 2022-09-14 DIAGNOSIS — I2510 Atherosclerotic heart disease of native coronary artery without angina pectoris: Secondary | ICD-10-CM

## 2022-09-14 DIAGNOSIS — I110 Hypertensive heart disease with heart failure: Secondary | ICD-10-CM

## 2022-09-14 DIAGNOSIS — I509 Heart failure, unspecified: Secondary | ICD-10-CM

## 2022-09-14 DIAGNOSIS — H02831 Dermatochalasis of right upper eyelid: Secondary | ICD-10-CM | POA: Diagnosis not present

## 2022-09-14 DIAGNOSIS — Z7901 Long term (current) use of anticoagulants: Secondary | ICD-10-CM | POA: Insufficient documentation

## 2022-09-14 DIAGNOSIS — I252 Old myocardial infarction: Secondary | ICD-10-CM | POA: Insufficient documentation

## 2022-09-14 DIAGNOSIS — Z951 Presence of aortocoronary bypass graft: Secondary | ICD-10-CM | POA: Insufficient documentation

## 2022-09-14 HISTORY — PX: BROW LIFT: SHX178

## 2022-09-14 LAB — BASIC METABOLIC PANEL
Anion gap: 8 (ref 5–15)
BUN: 29 mg/dL — ABNORMAL HIGH (ref 8–23)
CO2: 20 mmol/L — ABNORMAL LOW (ref 22–32)
Calcium: 8.9 mg/dL (ref 8.9–10.3)
Chloride: 114 mmol/L — ABNORMAL HIGH (ref 98–111)
Creatinine, Ser: 1.2 mg/dL (ref 0.61–1.24)
GFR, Estimated: >60 mL/min (ref >60)
Glucose, Bld: 92 mg/dL (ref 70–99)
Potassium: 4.7 mmol/L (ref 3.5–5.1)
Sodium: 142 mmol/L (ref 135–145)

## 2022-09-14 SURGERY — BLEPHAROPLASTY
Anesthesia: General | Site: Eye | Laterality: Bilateral

## 2022-09-14 MED ORDER — BUPIVACAINE HCL (PF) 0.75 % IJ SOLN
INTRAMUSCULAR | Status: DC | PRN
  Administered 2022-09-14: 2 mL

## 2022-09-14 MED ORDER — PROPOFOL 10 MG/ML IV BOLUS
INTRAVENOUS | Status: DC | PRN
  Administered 2022-09-14 (×2): 20 mg via INTRAVENOUS

## 2022-09-14 MED ORDER — CHLORHEXIDINE GLUCONATE 0.12 % MT SOLN
15.0000 mL | Freq: Once | OROMUCOSAL | Status: AC
  Administered 2022-09-14: 15 mL via OROMUCOSAL
  Filled 2022-09-14: qty 15

## 2022-09-14 MED ORDER — ERYTHROMYCIN 5 MG/GM OP OINT
TOPICAL_OINTMENT | Freq: Once | OPHTHALMIC | Status: DC
  Filled 2022-09-14: qty 1

## 2022-09-14 MED ORDER — BACITRACIN ZINC 500 UNIT/GM EX OINT
TOPICAL_OINTMENT | CUTANEOUS | Status: AC
  Filled 2022-09-14: qty 28.35

## 2022-09-14 MED ORDER — TETRACAINE HCL 0.5 % OP SOLN
OPHTHALMIC | Status: AC
  Filled 2022-09-14: qty 4

## 2022-09-14 MED ORDER — BSS IO SOLN
INTRAOCULAR | Status: AC
  Filled 2022-09-14: qty 15

## 2022-09-14 MED ORDER — FENTANYL CITRATE (PF) 100 MCG/2ML IJ SOLN: 25.0000 ug | INTRAMUSCULAR | Status: DC | PRN

## 2022-09-14 MED ORDER — PROPOFOL 500 MG/50ML IV EMUL
INTRAVENOUS | Status: DC | PRN
  Administered 2022-09-14: 50 ug/kg/min via INTRAVENOUS

## 2022-09-14 MED ORDER — DIPHENHYDRAMINE HCL 50 MG/ML IJ SOLN
INTRAMUSCULAR | Status: DC | PRN
  Administered 2022-09-14: 25 mg via INTRAVENOUS

## 2022-09-14 MED ORDER — ORAL CARE MOUTH RINSE: 15.0000 mL | Freq: Once | OROMUCOSAL | Status: AC

## 2022-09-14 MED ORDER — LIDOCAINE-EPINEPHRINE 1 %-1:100000 IJ SOLN
INTRAMUSCULAR | Status: DC | PRN
  Administered 2022-09-14: 2 mL

## 2022-09-14 MED ORDER — LACTATED RINGERS IV SOLN: INTRAVENOUS | Status: DC

## 2022-09-14 MED ORDER — FENTANYL CITRATE (PF) 100 MCG/2ML IJ SOLN
INTRAMUSCULAR | Status: DC | PRN
  Administered 2022-09-14: 50 ug via INTRAVENOUS

## 2022-09-14 MED ORDER — BUPIVACAINE HCL (PF) 0.75 % IJ SOLN
INTRAMUSCULAR | Status: AC
  Filled 2022-09-14: qty 10

## 2022-09-14 MED ORDER — OXYCODONE HCL 5 MG PO TABS: 5.0000 mg | ORAL_TABLET | Freq: Once | ORAL | Status: DC | PRN

## 2022-09-14 MED ORDER — EPHEDRINE 5 MG/ML INJ
INTRAVENOUS | Status: AC
  Filled 2022-09-14: qty 5

## 2022-09-14 MED ORDER — NEOMYCIN-POLYMYXIN-DEXAMETH 3.5-10000-0.1 OP OINT
TOPICAL_OINTMENT | OPHTHALMIC | Status: AC
  Filled 2022-09-14: qty 3.5

## 2022-09-14 MED ORDER — ACETAMINOPHEN 500 MG PO TABS
1000.0000 mg | ORAL_TABLET | Freq: Once | ORAL | Status: AC
  Administered 2022-09-14: 1000 mg via ORAL
  Filled 2022-09-14: qty 2

## 2022-09-14 MED ORDER — APIXABAN 5 MG PO TABS: 5.0000 mg | ORAL_TABLET | Freq: Two times a day (BID) | ORAL | 3 refills | Status: DC

## 2022-09-14 MED ORDER — AMISULPRIDE (ANTIEMETIC) 5 MG/2ML IV SOLN: 10.0000 mg | Freq: Once | INTRAVENOUS | Status: DC | PRN

## 2022-09-14 MED ORDER — FENTANYL CITRATE (PF) 250 MCG/5ML IJ SOLN
INTRAMUSCULAR | Status: AC
  Filled 2022-09-14: qty 5

## 2022-09-14 MED ORDER — LIDOCAINE-EPINEPHRINE 1 %-1:100000 IJ SOLN
INTRAMUSCULAR | Status: AC
  Filled 2022-09-14: qty 1

## 2022-09-14 MED ORDER — ERYTHROMYCIN 5 MG/GM OP OINT
TOPICAL_OINTMENT | OPHTHALMIC | Status: AC
  Filled 2022-09-14: qty 3.5

## 2022-09-14 MED ORDER — OXYCODONE HCL 5 MG/5ML PO SOLN: 5.0000 mg | Freq: Once | ORAL | Status: DC | PRN

## 2022-09-14 MED ORDER — ASPIRIN 81 MG PO TBEC: 81.0000 mg | DELAYED_RELEASE_TABLET | ORAL | Status: DC

## 2022-09-14 MED ORDER — 0.9 % SODIUM CHLORIDE (POUR BTL) OPTIME
TOPICAL | Status: DC | PRN
  Administered 2022-09-14: 1000 mL

## 2022-09-14 MED ORDER — PROPOFOL 10 MG/ML IV BOLUS
INTRAVENOUS | Status: AC
  Filled 2022-09-14: qty 20

## 2022-09-14 MED ORDER — SODIUM CHLORIDE 0.9 % IV SOLN: INTRAVENOUS | Status: DC

## 2022-09-14 SURGICAL SUPPLY — 46 items
APL SRG 3 HI ABS STRL LF PLS (MISCELLANEOUS) ×2
APL SWBSTK 6 STRL LF DISP (MISCELLANEOUS) ×1
APPLICATOR COTTON TIP 6 STRL (MISCELLANEOUS) ×1 IMPLANT
APPLICATOR COTTON TIP 6IN STRL (MISCELLANEOUS) ×1
APPLICATOR DR MATTHEWS STRL (MISCELLANEOUS) ×2 IMPLANT
BLADE SURG 15 STRL LF DISP TIS (BLADE) ×2 IMPLANT
BLADE SURG 15 STRL SS (BLADE) ×2
BNDG CMPR 5X2 CHSV 1 LYR STRL (GAUZE/BANDAGES/DRESSINGS)
BNDG COHESIVE 2X5 TAN ST LF (GAUZE/BANDAGES/DRESSINGS) ×1 IMPLANT
CORD BIPOLAR FORCEPS 12FT (ELECTRODE) ×1 IMPLANT
COVER SURGICAL LIGHT HANDLE (MISCELLANEOUS) ×1 IMPLANT
DRAPE ORTHO SPLIT 77X108 STRL (DRAPES) ×1
DRAPE SURG ORHT 6 SPLT 77X108 (DRAPES) ×1 IMPLANT
ELECT NDL BLADE 2-5/6 (NEEDLE) ×1 IMPLANT
ELECT NEEDLE BLADE 2-5/6 (NEEDLE) ×1 IMPLANT
ELECT REM PT RETURN 9FT ADLT (ELECTROSURGICAL) ×1
ELECTRODE REM PT RTRN 9FT ADLT (ELECTROSURGICAL) ×1 IMPLANT
FORCEPS BIPOLAR SPETZLER 8 1.0 (NEUROSURGERY SUPPLIES) ×1 IMPLANT
GAUZE 4X4 16PLY ~~LOC~~+RFID DBL (SPONGE) IMPLANT
GLOVE SS BIOGEL STRL SZ 7.5 (GLOVE) ×1 IMPLANT
GOWN STRL REUS W/ TWL LRG LVL3 (GOWN DISPOSABLE) ×2 IMPLANT
GOWN STRL REUS W/TWL LRG LVL3 (GOWN DISPOSABLE) ×2
KIT BASIN OR (CUSTOM PROCEDURE TRAY) ×1 IMPLANT
NDL PRECISIONGLIDE 27X1.5 (NEEDLE) IMPLANT
NEEDLE PRECISIONGLIDE 27X1.5 (NEEDLE) ×1 IMPLANT
NS IRRIG 1000ML POUR BTL (IV SOLUTION) ×1 IMPLANT
PAD ARMBOARD 7.5X6 YLW CONV (MISCELLANEOUS) ×2 IMPLANT
PENCIL BUTTON HOLSTER BLD 10FT (ELECTRODE) ×1 IMPLANT
STAPLER VISISTAT (STAPLE) ×1 IMPLANT
SUCTION FRAZIER TIP 8 FR DISP (SUCTIONS)
SUCTION TUBE FRAZIER 8FR DISP (SUCTIONS) IMPLANT
SUT CHROMIC 5 0 P 3 (SUTURE) IMPLANT
SUT ETHILON 7 0 P 6 18 (SUTURE) IMPLANT
SUT GUT PLAIN 6-0 1X18 ABS (SUTURE) IMPLANT
SUT MON AB 5-0 PS2 18 (SUTURE) IMPLANT
SUT PLAIN 6 0 TG1408 (SUTURE) IMPLANT
SUT PROLENE 4 0 PS 2 18 (SUTURE) IMPLANT
SUT PROLENE 6 0 CC (SUTURE) IMPLANT
SUT SILK 4 0 C 3 735G (SUTURE) IMPLANT
SUT SILK 4 0 P 3 (SUTURE) IMPLANT
SUT VIC AB 4-0 P-3 18XBRD (SUTURE) IMPLANT
SUT VIC AB 4-0 P3 18 (SUTURE)
SYR CONTROL 10ML LL (SYRINGE) IMPLANT
TOWEL GREEN STERILE FF (TOWEL DISPOSABLE) ×2 IMPLANT
TUBE CONNECTING 12X1/4 (SUCTIONS) IMPLANT
WATER STERILE IRR 1000ML POUR (IV SOLUTION) ×1 IMPLANT

## 2022-09-14 NOTE — Anesthesia Procedure Notes (Signed)
Procedure Name: MAC Date/Time: 09/14/2022 12:49 PM  Performed by: Caren Macadam, CRNAPre-anesthesia Checklist: Patient identified, Emergency Drugs available, Suction available, Patient being monitored and Timeout performed Patient Re-evaluated:Patient Re-evaluated prior to induction Oxygen Delivery Method: Nasal cannula Preoxygenation: Pre-oxygenation with 100% oxygen Induction Type: IV induction

## 2022-09-14 NOTE — Anesthesia Postprocedure Evaluation (Signed)
Anesthesia Post Note  Patient: Timothy Munoz  Procedure(s) Performed: BLEPHAROPLASTY (Bilateral: Eye)     Patient location during evaluation: PACU Anesthesia Type: MAC Level of consciousness: awake and alert Pain management: pain level controlled Vital Signs Assessment: post-procedure vital signs reviewed and stable Respiratory status: spontaneous breathing, nonlabored ventilation and respiratory function stable Cardiovascular status: blood pressure returned to baseline Postop Assessment: no apparent nausea or vomiting Anesthetic complications: no   No notable events documented.  Last Vitals:  Vitals:   09/14/22 1400 09/14/22 1415  BP: (!) 142/81 (!) 141/81  Pulse: (!) 57 (!) 56  Resp: 17 15  Temp:  37.1 C  SpO2: 96% 97%    Last Pain:  Vitals:   09/14/22 1415  TempSrc:   PainSc: 0-No pain                 Shanda Howells

## 2022-09-14 NOTE — Transfer of Care (Signed)
Immediate Anesthesia Transfer of Care Note  Patient: Timothy Munoz  Procedure(s) Performed: BLEPHAROPLASTY (Bilateral: Eye)  Patient Location: PACU  Anesthesia Type:MAC  Level of Consciousness: awake and alert   Airway & Oxygen Therapy: Patient Spontanous Breathing and Patient connected to nasal cannula oxygen  Post-op Assessment: Report given to RN and Post -op Vital signs reviewed and stable  Post vital signs: Reviewed and stable  Last Vitals:  Vitals Value Taken Time  BP 145/81 09/14/22 1340  Temp    Pulse 57 09/14/22 1342  Resp 16 09/14/22 1342  SpO2 99 % 09/14/22 1342  Vitals shown include unvalidated device data.  Last Pain:  Vitals:   09/14/22 0922  TempSrc:   PainSc: 0-No pain      Patients Stated Pain Goal: 0 (09/14/22 1610)  Complications: No notable events documented.

## 2022-09-14 NOTE — Interval H&P Note (Signed)
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  The various methods of treatment have been discussed with the patient and family. After consideration of risks, benefits and other options for treatment, the patient has consented to  Procedure(s): BLEPHAROPLASTY (Bilateral) as a surgical intervention.  The patient's history has been reviewed, patient examined, no change in status, stable for surgery.  I have reviewed the patient's chart and labs.  Questions were answered to the patient's satisfaction.     Dairl Ponder

## 2022-09-14 NOTE — Op Note (Signed)
Operative Note PATIENT NAME:  Timothy Munoz HOSPITAL: [facility] MRN:  [id number] CSN: [id number] DATE OF SERVICE:  09/14/2022 DATE OF BIRTH:  [birth year]  PREOPERATIVE DIAGNOSIS:   1. Right upper eyelid dermatochalasis 2. Left upper eyelid dermatochalasis  POSTOPERATIVE DIAGNOSIS:  same  PROCEDURE(S) PERFORMED:  1. 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  Informed consent had been provided prior to the day of surgery at the patient's clinic visit.  All questions were answered.  The patient understands that the goal of surgery is to improve visual function. Prior to entering the operative suite, the general surgical consent was signed. All questions were answered.  The patient/advocate understands the risks of surgery include but are not limited to bleeding, infection, scar formation, asymmetry, the need for additional surgical intervention and other less common outcomes noted on the specific eyelid informed consent and anesthesia risks listed on the anesthesia consent. The patient accepts the risks and desires to proceed with surgery        DESCRIPTION OF PROCEDURE: After obtaining informed consent, the patient was taken back to the operating room and laid supine on the operating room table.  Under monitored anesthesia care, the operative site(s) were anesthetized with 2% lidocaine with epinephrine and bupivicaine.    Attention was then directed to the right upper eyelid.  Markings were made in the intended area of incision.  A pinch test was performed to ensure the patient could close the rise postoperatively.   The markings were incised with a 15 blade.  The skin was then removed.  The upper 50% of orbicularis was then removed.  Bipolar cautery was used for hemostasis.  The nasal fat plants were then excised with cautery.  The skin was closed with 6-0 plain gut suture in running fashion.  The same procedure was performed on the contralateral eye. Ointment was placed in the operative eye(s) and incision.   The patient was then taken to the recovery room in stable condition.   Dairl Ponder, MD, PhD Ophthalmic Plastic and Reconstructive Surgery [facility] [phone] (office)

## 2022-09-15 ENCOUNTER — Encounter (HOSPITAL_COMMUNITY): Payer: Self-pay | Admitting: Optometry

## 2022-09-17 ENCOUNTER — Telehealth: Payer: Self-pay | Admitting: Cardiovascular Disease

## 2022-09-17 DIAGNOSIS — E785 Hyperlipidemia, unspecified: Secondary | ICD-10-CM

## 2022-09-17 MED ORDER — EZETIMIBE 10 MG PO TABS: 10.0000 mg | ORAL_TABLET | Freq: Every day | ORAL | 3 refills | Status: DC

## 2022-09-17 NOTE — Telephone Encounter (Signed)
Pt calling to f/u on lab results. Please advise 

## 2022-09-17 NOTE — Telephone Encounter (Signed)
Timothy Bihari, MD 09/16/2022  7:45 PM EDT Back to Top    Total cholesterol 145 LDL 92 triglycerides 96 HDL mildly decreased at 35.  Creatinine stable at 1.26 potassium mildly elevated at 5.4.  Transaminases normal;alk phos mildly increased, unchanged.  Add Zetia 10 mg and increase rosuvastatin to 40 mg; hold spironolactone with potassium elevation; repeat bmet in 1 week   Called pt, gave the information above. Pt will hold spironalactone.  Prescription sent for zetia 10 mg once a day to [facility]; order for BMET placed- pt will come in one week to have blood drawn.

## 2022-09-27 ENCOUNTER — Other Ambulatory Visit: Payer: Self-pay

## 2022-09-27 DIAGNOSIS — E785 Hyperlipidemia, unspecified: Secondary | ICD-10-CM | POA: Diagnosis not present

## 2022-09-28 LAB — BASIC METABOLIC PANEL
BUN/Creatinine Ratio: 17 (ref 10–24)
BUN: 20 mg/dL (ref 8–27)
CO2: 22 mmol/L (ref 20–29)
Calcium: 9.3 mg/dL (ref 8.6–10.2)
Chloride: 106 mmol/L (ref 96–106)
Creatinine, Ser: 1.19 mg/dL (ref 0.76–1.27)
Glucose: 87 mg/dL (ref 70–99)
Potassium: 5.1 mmol/L (ref 3.5–5.2)
Sodium: 140 mmol/L (ref 134–144)
eGFR: 63 mL/min/{1.73_m2} (ref >59)

## 2022-10-01 ENCOUNTER — Other Ambulatory Visit: Payer: Self-pay | Admitting: Cardiology

## 2022-10-01 DIAGNOSIS — I480 Paroxysmal atrial fibrillation: Secondary | ICD-10-CM

## 2022-10-01 NOTE — Telephone Encounter (Signed)
Prescription refill request for Eliquis received. Indication: a flutter Last office visit: 08/09/22 Scr: 1.19 09/27/22 epic Age: 77 Weight: 107kg

## 2022-10-03 ENCOUNTER — Telehealth: Payer: Self-pay | Admitting: Cardiovascular Disease

## 2022-10-03 NOTE — Telephone Encounter (Signed)
Patient is states he is returning a call to discuss results.

## 2022-10-03 NOTE — Telephone Encounter (Signed)
Spoke with pt, aware recent lab work were normal. His potassium and kidney function are back to normal.

## 2022-10-18 ENCOUNTER — Emergency Department (HOSPITAL_COMMUNITY)
Admission: EM | Admit: 2022-10-18 | Discharge: 2022-10-18 | Disposition: A | Discharge status: Home / Self Care | Payer: Medicare HMO | Attending: Emergency Medicine | Admitting: Emergency Medicine

## 2022-10-18 ENCOUNTER — Telehealth: Payer: Self-pay | Admitting: Cardiovascular Disease

## 2022-10-18 ENCOUNTER — Encounter (HOSPITAL_COMMUNITY): Payer: Self-pay | Admitting: Emergency Medicine

## 2022-10-18 ENCOUNTER — Emergency Department (HOSPITAL_COMMUNITY): Payer: Medicare HMO

## 2022-10-18 DIAGNOSIS — I4892 Unspecified atrial flutter: Secondary | ICD-10-CM

## 2022-10-18 DIAGNOSIS — I480 Paroxysmal atrial fibrillation: Secondary | ICD-10-CM | POA: Diagnosis not present

## 2022-10-18 DIAGNOSIS — R0602 Shortness of breath: Secondary | ICD-10-CM | POA: Diagnosis not present

## 2022-10-18 DIAGNOSIS — Z7982 Long term (current) use of aspirin: Secondary | ICD-10-CM | POA: Diagnosis not present

## 2022-10-18 DIAGNOSIS — Z7901 Long term (current) use of anticoagulants: Secondary | ICD-10-CM | POA: Insufficient documentation

## 2022-10-18 LAB — CBC WITH DIFFERENTIAL/PLATELET
Abs Immature Granulocytes: 0.01 10*3/uL (ref 0.00–0.07)
Basophils Absolute: 0 10*3/uL (ref 0.0–0.1)
Basophils Relative: 0 %
Eosinophils Absolute: 0.1 10*3/uL (ref 0.0–0.5)
Eosinophils Relative: 1 %
HCT: 41 % (ref 39.0–52.0)
Hemoglobin: 12.8 g/dL — ABNORMAL LOW (ref 13.0–17.0)
Immature Granulocytes: 0 %
Lymphocytes Relative: 22 %
Lymphs Abs: 1.5 10*3/uL (ref 0.7–4.0)
MCH: 26.8 pg (ref 26.0–34.0)
MCHC: 31.2 g/dL (ref 30.0–36.0)
MCV: 86 fL (ref 80.0–100.0)
Monocytes Absolute: 0.6 10*3/uL (ref 0.1–1.0)
Monocytes Relative: 8 %
Neutro Abs: 4.9 10*3/uL (ref 1.7–7.7)
Neutrophils Relative %: 69 %
Platelets: 282 10*3/uL (ref 150–400)
RBC: 4.77 MIL/uL (ref 4.22–5.81)
RDW: 15.4 % (ref 11.5–15.5)
WBC: 7.1 10*3/uL (ref 4.0–10.5)
nRBC: 0 % (ref 0.0–0.2)

## 2022-10-18 LAB — BASIC METABOLIC PANEL
Anion gap: 5 (ref 5–15)
BUN: 22 mg/dL (ref 8–23)
CO2: 20 mmol/L — ABNORMAL LOW (ref 22–32)
Calcium: 7.5 mg/dL — ABNORMAL LOW (ref 8.9–10.3)
Chloride: 115 mmol/L — ABNORMAL HIGH (ref 98–111)
Creatinine, Ser: 1.24 mg/dL (ref 0.61–1.24)
GFR, Estimated: >60 mL/min (ref >60)
Glucose, Bld: 83 mg/dL (ref 70–99)
Potassium: 3.7 mmol/L (ref 3.5–5.1)
Sodium: 140 mmol/L (ref 135–145)

## 2022-10-18 LAB — MAGNESIUM: Magnesium: 2.1 mg/dL (ref 1.7–2.4)

## 2022-10-18 LAB — TROPONIN I (HIGH SENSITIVITY): Troponin I (High Sensitivity): 15 ng/L (ref <18)

## 2022-10-18 LAB — BRAIN NATRIURETIC PEPTIDE: B Natriuretic Peptide: 510.7 pg/mL — ABNORMAL HIGH (ref 0.0–100.0)

## 2022-10-18 MED ORDER — ETOMIDATE 2 MG/ML IV SOLN
15.0000 mg | Freq: Once | INTRAVENOUS | Status: AC
  Administered 2022-10-18: 15 mg via INTRAVENOUS

## 2022-10-18 MED ORDER — ETOMIDATE 2 MG/ML IV SOLN
INTRAVENOUS | Status: AC | PRN
  Administered 2022-10-18: 15 mg via INTRAVENOUS

## 2022-10-18 MED ORDER — METOPROLOL TARTRATE 5 MG/5ML IV SOLN
5.0000 mg | Freq: Once | INTRAVENOUS | Status: AC
  Administered 2022-10-18: 5 mg via INTRAVENOUS
  Filled 2022-10-18: qty 5

## 2022-10-18 NOTE — ED Triage Notes (Signed)
Pt c/o sob x 3 days, worse with exertion; denies pain; states he was admitted with fluid overload May 2023; also endorses bright red blood in stool x couple of days; pt on eliquis; hx CHF, CABG 2022

## 2022-10-18 NOTE — Telephone Encounter (Signed)
Patient states blood in stool.  States bright red blood in stool for several days and states quite a lot.  Advised he needs to reach out to his PCP and if unable to get in touch with them or to be seen, he should go to the ED.  The SOB has been increased since loss of blood.  Repeated again, if he does not reach his PCP, then to go to ED to see if he has a GI bleed.  (He does not have a GI doctor)

## 2022-10-18 NOTE — ED Provider Notes (Signed)
EMERGENCY DEPARTMENT AT [facility] Provider Note   CSN: [id number] Arrival date & time: 10/18/22  1049     History  Chief Complaint  Patient presents with   Shortness of Breath    Timothy Munoz is a 77 y.o. male.  77 yo M with a chief complaint of difficulty breathing.  Going on for 3 days now.  Has had a lot of trouble with exertion and since the feel a bit short as soon as he stands up.  He felt like things gotten progressively worse and then it up calling his cardiologist.  He has been having bright red blood per rectum.  Is on Eliquis.  Denies any palpitations.  He thinks this feels similar to when he had a heart failure exacerbation was admitted about a month ago.   Shortness of Breath      Home Medications Prior to Admission medications   Medication Sig Start Date End Date Taking? Authorizing Provider  apixaban (ELIQUIS) 5 MG TABS tablet TAKE ONE TABLET BY MOUTH TWICE DAILY 10/01/22   Lennette Bihari, MD  aspirin EC 81 MG tablet Take 1 tablet (81 mg total) by mouth every other day. Swallow whole. 09/16/22   Dairl Ponder, MD  carvedilol (COREG) 6.25 MG tablet Take 1 tablet (6.25 mg total) by mouth 2 (two) times daily with a meal. 05/17/22   Lennette Bihari, MD  ENTRESTO 24-26 MG TAKE ONE TABLET BY MOUTH TWICE DAILY 10/01/22   Lennette Bihari, MD  ezetimibe (ZETIA) 10 MG tablet Take 1 tablet (10 mg total) by mouth daily. 09/17/22   Lennette Bihari, MD  FARXIGA 10 MG TABS tablet TAKE ONE TABLET BY MOUTH DAILY BEFORE BREAKFAST Patient taking differently: Take 5 mg by mouth 2 (two) times daily. 05/16/22   Marjie Skiff E, PA-C  rosuvastatin (CRESTOR) 40 MG tablet Take 1 tablet (40 mg total) by mouth daily. 08/31/22   Lennette Bihari, MD  spironolactone (ALDACTONE) 25 MG tablet Take 0.5 tablet every THIRD day 01/08/22   Azalee Course, PA      Allergies    Patient has no known allergies.    Review of Systems   Review of Systems  Respiratory:  Positive for  shortness of breath.     Physical Exam Updated Vital Signs BP (!) 111/96 (BP Location: Right Arm)   Pulse 91   Temp 97.9 F (36.6 C) (Oral)   Resp (!) 23   SpO2 100%  Physical Exam Vitals and nursing note reviewed.  Constitutional:      Appearance: He is well-developed.  HENT:     Head: Normocephalic and atraumatic.  Eyes:     Pupils: Pupils are equal, round, and reactive to light.  Neck:     Vascular: No JVD.  Cardiovascular:     Rate and Rhythm: Tachycardia present. Rhythm irregular.     Heart sounds: No murmur heard.    No friction rub. No gallop.  Pulmonary:     Effort: No respiratory distress.     Breath sounds: No wheezing.  Abdominal:     General: There is no distension.     Tenderness: There is no abdominal tenderness. There is no guarding or rebound.  Musculoskeletal:        General: Normal range of motion.     Cervical back: Normal range of motion and neck supple.  Skin:    Coloration: Skin is not pale.     Findings: No rash.  Neurological:     Mental Status: He is alert and oriented to person, place, and time.  Psychiatric:        Behavior: Behavior normal.     ED Results / Procedures / Treatments   Labs (all labs ordered are listed, but only abnormal results are displayed) Labs Reviewed  CBC WITH DIFFERENTIAL/PLATELET - Abnormal; Notable for the following components:      Result Value   Hemoglobin 12.8 (*)    All other components within normal limits  BRAIN NATRIURETIC PEPTIDE - Abnormal; Notable for the following components:   B Natriuretic Peptide 510.7 (*)    All other components within normal limits  BASIC METABOLIC PANEL - Abnormal; Notable for the following components:   Chloride 115 (*)    CO2 20 (*)    Calcium 7.5 (*)    All other components within normal limits  MAGNESIUM  I-STAT CHEM 8, ED  TROPONIN I (HIGH SENSITIVITY)    EKG EKG Interpretation  Date/Time:  Thursday October 18 2022 11:04:40 EDT Ventricular Rate:  141 PR  Interval:  48 QRS Duration: 92 QT Interval:  340 QTC Calculation: 520 R Axis:   129 Text Interpretation: Sinus tachycardia with short PR vs flutter with 2:1 block Septal infarct , age undetermined st changes likely rate related Otherwise no significant change Confirmed by Melene Plan [id number]) on 10/18/2022 11:14:49 AM  Radiology DG Chest Port 1 View  Result Date: 10/18/2022 CLINICAL DATA:  sob EXAM: PORTABLE CHEST 1 VIEW COMPARISON:  CXR 09/17/21 FINDINGS: Status post median sternotomy and CABG. No pleural effusion. No pneumothorax. Unchanged cardiac and mediastinal contours. No focal airspace opacity. No radiographically apparent displaced rib fractures. Visualized upper abdomen is unremarkable. IMPRESSION: No focal airspace opacity. Electronically Signed   By: Lorenza Cambridge M.D.   On: 10/18/2022 12:19    Procedures Procedures    Medications Ordered in ED Medications  metoprolol tartrate (LOPRESSOR) injection 5 mg (5 mg Intravenous Given 10/18/22 1206)    ED Course/ Medical Decision Making/ A&P                             Medical Decision Making Amount and/or Complexity of Data Reviewed Labs: ordered. Radiology: ordered.  Risk Prescription drug management.   77 yo M with a chief complaints of shortness of breath.  Patient arrived and clinically is in atrial fibrillation with rates going in the 140s.  He has flutter waves on the monitor.  He is also been describing bright red blood per rectum for the past few days.  Denies dark stool.  Denies abdominal pain.  Denies chest pain.  Within a laboratory evaluation here.  Will give a bolus dose of metoprolol.  Patient's heart rate is improved significantly he feels good as he is just sitting in the bed.  Awaiting his blood work.  Unfortunately his initial round of blood work hemolyzed.  Awaiting for results.  Plan to reassess for possible electrical cardioversion.  Patient care signed out to Dr. Wilkie Aye, please see their note for further  details of care in the ED.  The patients results and plan were reviewed and discussed.   Any x-rays performed were independently reviewed by myself.   Differential diagnosis were considered with the presenting HPI.  Medications  metoprolol tartrate (LOPRESSOR) injection 5 mg (5 mg Intravenous Given 10/18/22 1206)    Vitals:   10/18/22 1118 10/18/22 1247 10/18/22 1443  BP: (!) 129/90 99/70 (!) 111/96  Pulse: (!) 140 76 91  Resp: 19 17 (!) 23  Temp:  97.9 F (36.6 C) 97.9 F (36.6 C)  TempSrc:  Oral Oral  SpO2: 100% 98% 100%    Final diagnoses:  Atrial flutter, paroxysmal (HCC)    Admission/ observation were discussed with the admitting physician, patient and/or family and they are comfortable with the plan.          Final Clinical Impression(s) / ED Diagnoses Final diagnoses:  Atrial flutter, paroxysmal [facility])    Rx / DC Orders ED Discharge Orders     None         Melene Plan, DO 10/18/22 1534

## 2022-10-18 NOTE — Telephone Encounter (Signed)
Pt c/o Shortness Of Breath: STAT if SOB developed within the last 24 hours or pt is noticeably SOB on the phone  1. Are you currently SOB (can you hear that pt is SOB on the phone)? No, but was walking at the time and doesn't feel he can walk as good as normal.  2. How long have you been experiencing SOB? Last couple of days  3. Are you SOB when sitting or when up moving around? Both   4. Are you currently experiencing any other symptoms? Blood in stool     Cannot walk as far as he normally walks and still occurs when sitting down.

## 2022-10-18 NOTE — ED Provider Notes (Signed)
Patient signed out to me by previous provider. Please refer to their note for full HPI.  Briefly this is a 77 year old male with history of atrial fibrillation on Eliquis who presented to the emergency department with shortness of breath.  Found to be in atrial fibrillation.  Workup is otherwise reassuring and baseline.  Discussed with the patient electrical cardioversion as he has been compliant with his Eliquis and he accepts.  Procedural sedation performed for electrical cardioversion which was successful.  I performed the sedation and resident physician Dr. Ericka Pontiff performed cardioversion. Please see their note for procedure. Patient in normal sinus rhythm.  Vital signs are stable.  Patient has been ambulating in the department without difficulty.  Will plan for outpatient cardiology/EP follow-up.  Patient at this time appears safe and stable for discharge and close outpatient follow up. Discharge plan and strict return to ED precautions discussed, patient verbalizes understanding and agreement.  .Sedation  Date/Time: 10/18/2022 6:24 PM  Performed by: Rozelle Logan, DO Authorized by: Rozelle Logan, DO   Consent:    Consent obtained:  Written   Consent given by:  Patient   Risks discussed:  Dysrhythmia, prolonged hypoxia resulting in organ damage, prolonged sedation necessitating reversal, respiratory compromise necessitating ventilatory assistance and intubation, vomiting and allergic reaction Universal protocol:    Immediately prior to procedure, a time out was called: yes   Indications:    Procedure performed:  Cardioversion   Procedure necessitating sedation performed by:  Different physician Pre-sedation assessment:    Time since last food or drink:  8   ASA classification: class 2 - patient with mild systemic disease     Mallampati score:  I - soft palate, uvula, fauces, pillars visible   Neck mobility: normal     Pre-sedation assessments completed and reviewed: airway  patency, cardiovascular function, mental status and respiratory function   Immediate pre-procedure details:    Reassessment: Patient reassessed immediately prior to procedure     Reviewed: vital signs, relevant labs/tests and NPO status     Verified: bag valve mask available, emergency equipment available, IV patency confirmed and oxygen available   Procedure details (see MAR for exact dosages):    Preoxygenation:  Nasal cannula   Sedation:  Etomidate   Intended level of sedation: deep and moderate (conscious sedation)   Analgesia:  None   Intra-procedure monitoring:  Blood pressure monitoring, continuous capnometry, frequent LOC assessments, frequent vital sign checks, continuous pulse oximetry and cardiac monitor   Intra-procedure events: none     Total Provider sedation time (minutes):  10 Post-procedure details:    Post-sedation assessments completed and reviewed: airway patency, cardiovascular function, mental status, nausea/vomiting and temperature     Patient is stable for discharge or admission: yes     Procedure completion:  Tolerated well, no immediate complications     Rozelle Logan, DO 10/18/22 1828

## 2022-10-18 NOTE — Discharge Instructions (Signed)
You have been seen and discharged from the emergency department.  You were found to be in atrial fibrillation, we believe that this is the cause of your symptoms.  We performed electrical cardioversion here in the department with sedation.  Here now in normal sinus rhythm.  Continue taking your home medications as prescribed, specifically the anticoagulation.  Follow-up with your cardiologist and primary provider for further evaluation and further care. Take home medications as prescribed. If you have any worsening symptoms or further concerns for your health please return to an emergency department for further evaluation.

## 2022-10-18 NOTE — ED Notes (Signed)
Pt able to ambulate in the hallway without assistance. No complaints at this time.

## 2022-10-18 NOTE — ED Provider Notes (Signed)
.  Cardioversion  Date/Time: 10/18/2022 6:27 PM  Performed by: Chase Caller, MD Authorized by: Rozelle Logan, DO   Consent:    Consent obtained:  Written   Consent given by:  Patient   Risks discussed:  Cutaneous burn, death, induced arrhythmia and pain   Alternatives discussed:  No treatment Pre-procedure details:    Cardioversion basis:  Elective   Rhythm:  Atrial fibrillation   Electrode placement:  Anterior-posterior Patient sedated: Yes. Refer to sedation procedure documentation for details of sedation.  Attempt one:    Cardioversion mode:  Synchronous   Shock (Joules):  200   Shock outcome:  Conversion to normal sinus rhythm Post-procedure details:    Patient status:  Awake   Patient tolerance of procedure:  Tolerated well, no immediate complications     Chase Caller, MD 10/18/22 Silva Bandy    Rozelle Logan, DO 10/18/22 2335

## 2022-10-18 NOTE — ED Notes (Signed)
Pt is able to ambulate to restroom on his own without assistance

## 2022-10-19 ENCOUNTER — Telehealth (HOSPITAL_COMMUNITY): Payer: Self-pay

## 2022-10-19 NOTE — Telephone Encounter (Signed)
Contacted patient regarding ED follow up appointment. He is scheduled on 6/11 @ 2:00 pm to be seen at the [facility]. Communicated with patient and he verbalized understanding.

## 2022-10-23 ENCOUNTER — Ambulatory Visit (HOSPITAL_COMMUNITY)
Admission: RE | Admit: 2022-10-23 | Discharge: 2022-10-23 | Disposition: A | Discharge status: Home / Self Care | Payer: Medicare HMO | Source: Ambulatory Visit | Attending: Internal Medicine | Admitting: Internal Medicine

## 2022-10-23 ENCOUNTER — Encounter (HOSPITAL_COMMUNITY): Payer: Self-pay | Admitting: Internal Medicine

## 2022-10-23 VITALS — BP 138/78 | HR 63 | Ht 74.0 in | Wt 240.6 lb

## 2022-10-23 DIAGNOSIS — I4892 Unspecified atrial flutter: Secondary | ICD-10-CM | POA: Insufficient documentation

## 2022-10-23 DIAGNOSIS — I5042 Chronic combined systolic (congestive) and diastolic (congestive) heart failure: Secondary | ICD-10-CM | POA: Diagnosis not present

## 2022-10-23 DIAGNOSIS — I480 Paroxysmal atrial fibrillation: Secondary | ICD-10-CM | POA: Diagnosis not present

## 2022-10-23 DIAGNOSIS — E785 Hyperlipidemia, unspecified: Secondary | ICD-10-CM | POA: Diagnosis not present

## 2022-10-23 DIAGNOSIS — D6859 Other primary thrombophilia: Secondary | ICD-10-CM | POA: Insufficient documentation

## 2022-10-23 DIAGNOSIS — D6869 Other thrombophilia: Secondary | ICD-10-CM | POA: Insufficient documentation

## 2022-10-23 DIAGNOSIS — I10 Essential (primary) hypertension: Secondary | ICD-10-CM | POA: Insufficient documentation

## 2022-10-23 DIAGNOSIS — Z951 Presence of aortocoronary bypass graft: Secondary | ICD-10-CM | POA: Insufficient documentation

## 2022-10-23 NOTE — Progress Notes (Addendum)
Primary Care Physician: [facility] Primary Cardiologist: Dr. [name]  Primary Electrophysiologist: Dr. [name] Referring Physician: [facility]   Timothy Munoz is a 77 y.o. male with a history of HLD, HTN, mitral valve insufficiency, ischemic cardiomyopathy, chronic combined systolic and diastolic HF, MI in 01/2021 s/p balloon angioplasty of LAD and CABG x 3 in 01/2021 with post operative atrial fibrillation required amiodarone and digoxin, and atrial flutter. Seen by Dr. [name] 10/06/21 for atrial flutter and due to having one episode recommended watchful waiting; ablation if found to have more flutter. He presents for consultation in the [facility] due to most recently s/p successful DCCV on 10/18/22 after presenting to the ED for 3 days of SOB found to be in atrial flutter with RVR. Patient is on Eliquis 5 mg BID for a CHADS2VASC score of 5.  On evaluation today, he is currently in NSR. He has not had any episodes of atrial flutter since 6/6. He feels well overall since cardioversion.  He is compliant with anticoagulation and has not missed any doses. He has no bleeding concerns.  Today, he denies symptoms of palpitations, chest pain, shortness of breath, orthopnea, PND, lower extremity edema, dizziness, presyncope, syncope, snoring, daytime somnolence, bleeding, or neurologic sequela. The patient is tolerating medications without difficulties and is otherwise without complaint today.   Atrial Fibrillation Risk Factors:  he does not have symptoms or diagnosis of sleep apnea. he does not have a history of rheumatic fever. he does not have a history of alcohol use. The patient does not have a history of early familial atrial fibrillation or other arrhythmias.  he has a BMI of Body mass index is 30.89 kg/m.Marland Kitchen Filed Weights   10/23/22 1335  Weight: 109.1 kg    Family History  Problem Relation Age of Onset   Hypertension Mother     Parkinson's disease Mother    Alzheimer's disease Mother    Hypertension Father    Suicidality Father     Atrial Fibrillation Management history:  Previous antiarrhythmic drugs: None  Previous cardioversions: 09/20/21, 10/18/22 Previous ablations: None Anticoagulation history: Eliquis 5 mg BID   Past Medical History:  Diagnosis Date   Arthritis    Blood dyscrasia    CHF (congestive heart failure) (HCC)    Coronary artery disease    Dysrhythmia    post-CABG PAF; aflutter, s/p DCCV 09/20/21   HFrEF (heart failure with reduced ejection fraction) (HCC)    Hyperlipidemia    mild    Hypertension    Knee pain    Myocardial infarction (HCC)    Obesity    Tremor    Tremor, essential 03/04/2019   Past Surgical History:  Procedure Laterality Date   APPENDECTOMY     BACK SURGERY     BROW LIFT Bilateral 09/14/2022   Procedure: BLEPHAROPLASTY;  Surgeon: Dairl Ponder, MD;  Location: [facility] OR;  Service: Ophthalmology;  Laterality: Bilateral;   CARDIAC CATHETERIZATION     CARDIOVERSION N/A 09/20/2021   Procedure: CARDIOVERSION;  Surgeon: Jake Bathe, MD;  Location: [facility] ENDOSCOPY;  Service: Cardiovascular;  Laterality: N/A;   CORONARY ARTERY BYPASS GRAFT N/A 01/20/2021   Procedure: CORONARY ARTERY BYPASS GRAFTING (CABG) X 3 ON PUMP, USING LEFT INTERNAL MAMMARY ARTERY AND LEFT ENDOSCOPIC GREATER SAPHENOUS VEIN HARVEST CONDUITS;  Surgeon: [name], MD;  Location: [facility] OR;  Service: Open Heart Surgery;  Laterality: N/A;   CORONARY/GRAFT ACUTE MI REVASCULARIZATION N/A 01/16/2021  Procedure: Coronary/Graft Acute MI Revascularization;  Surgeon: Lennette Bihari, MD;  Location: [facility] INVASIVE CV LAB;  Service: Cardiovascular;  Laterality: N/A;   ECTROPION REPAIR Bilateral 02/23/2022   Procedure: REPAIR OF ECTROPION;  Surgeon: Dairl Ponder, MD;  Location: [facility] OR;  Service: Ophthalmology;  Laterality: Bilateral;   ENDOVEIN HARVEST OF GREATER SAPHENOUS VEIN Left 01/20/2021   Procedure:  ENDOVEIN HARVEST OF GREATER SAPHENOUS VEIN;  Surgeon: [name], MD;  Location: [facility] OR;  Service: Open Heart Surgery;  Laterality: Left;   EYE SURGERY     HEMORRHOID SURGERY     IABP INSERTION N/A 01/16/2021   Procedure: IABP Insertion;  Surgeon: Lennette Bihari, MD;  Location: [facility] INVASIVE CV LAB;  Service: Cardiovascular;  Laterality: N/A;   LEFT HEART CATH AND CORONARY ANGIOGRAPHY N/A 01/16/2021   Procedure: LEFT HEART CATH AND CORONARY ANGIOGRAPHY;  Surgeon: Lennette Bihari, MD;  Location: [facility] INVASIVE CV LAB;  Service: Cardiovascular;  Laterality: N/A;   TEE WITHOUT CARDIOVERSION N/A 01/20/2021   Procedure: TRANSESOPHAGEAL ECHOCARDIOGRAM (TEE);  Surgeon: [name], MD;  Location: [facility] OR;  Service: Open Heart Surgery;  Laterality: N/A;   TEE WITHOUT CARDIOVERSION N/A 09/20/2021   Procedure: TRANSESOPHAGEAL ECHOCARDIOGRAM (TEE);  Surgeon: Jake Bathe, MD;  Location: [facility] ENDOSCOPY;  Service: Cardiovascular;  Laterality: N/A;    Current Outpatient Medications  Medication Sig Dispense Refill   apixaban (ELIQUIS) 5 MG TABS tablet TAKE ONE TABLET BY MOUTH TWICE DAILY 180 tablet 1   aspirin EC 81 MG tablet Take 1 tablet (81 mg total) by mouth every other day. Swallow whole.     carvedilol (COREG) 6.25 MG tablet Take 1 tablet (6.25 mg total) by mouth 2 (two) times daily with a meal. 180 tablet 1   ENTRESTO 24-26 MG TAKE ONE TABLET BY MOUTH TWICE DAILY 60 tablet 2   ezetimibe (ZETIA) 10 MG tablet Take 1 tablet (10 mg total) by mouth daily. 90 tablet 3   FARXIGA 10 MG TABS tablet TAKE ONE TABLET BY MOUTH DAILY BEFORE BREAKFAST (Patient taking differently: Take 5 mg by mouth daily.) 30 tablet 6   rosuvastatin (CRESTOR) 40 MG tablet Take 1 tablet (40 mg total) by mouth daily. 90 tablet 3   No current facility-administered medications for this encounter.    No Known Allergies  Social History   Socioeconomic History   Marital status: Single    Spouse name: Not on file    Number of children: 2   Years of education: 13   Highest education level: High school graduate  Occupational History   Occupation: retired    Comment: Pharmacist, hospital  Tobacco Use   Smoking status: Former   Smokeless tobacco: Former    Types: Chew    Quit date: 01/16/2021  Vaping Use   Vaping Use: Never used  Substance and Sexual Activity   Alcohol use: Not Currently    Comment: one beer every two months   Drug use: No   Sexual activity: Not on file  Other Topics Concern   Not on file  Social History Narrative   Right handed    Social Determinants of Health   Financial Resource Strain: Not on file  Food Insecurity: Not on file  Transportation Needs: Not on file  Physical Activity: Not on file  Stress: Not on file  Social Connections: Not on file  Intimate Partner Violence: Not on file    ROS- All systems are reviewed and negative except as per the HPI above.  Physical Exam: Vitals:   10/23/22 1335  BP: 138/78  Pulse: 63  Weight: 109.1 kg  Height: 6\' 2"  (1.88 m)    GEN- The patient is a well appearing male, alert and oriented x 3 today.   Head- normocephalic, atraumatic Eyes-  Sclera clear, conjunctiva pink Ears- hearing intact Oropharynx- clear Neck- supple  Lungs- Clear to ausculation bilaterally, normal work of breathing Heart- Regular rate and rhythm, no murmurs, rubs or gallops  GI- soft, NT, ND, + BS Extremities- no clubbing, cyanosis, or edema MS- no significant deformity or atrophy Skin- no rash or lesion Psych- euthymic mood, full affect Neuro- strength and sensation are intact  Wt Readings from Last 3 Encounters:  10/23/22 109.1 kg  09/14/22 108.9 kg  09/04/22 107 kg    EKG today demonstrates  NSR - incomplete RBBB HR 62 bpm PR 200 ms QRS 98 ms QT/Qtc 418/424 ms  Echo 06/19/22 demonstrated:  1. No apical thrombus with Definity contrast. Left ventricular ejection  fraction, by estimation, is 30 to 35%. The left ventricle has moderately   decreased function. The left ventricle demonstrates regional wall motion  abnormalities (see scoring  diagram/findings for description). The left ventricular internal cavity  size was moderately dilated. There is mild left ventricular hypertrophy.  Left ventricular diastolic parameters are consistent with Grade II  diastolic dysfunction  (pseudonormalization). There is severe akinesis of the left ventricular,  mid-apical anterior wall, anteroseptal wall and apical segment.   2. Right ventricular systolic function is normal. The right ventricular  size is normal. There is mildly elevated pulmonary artery systolic  pressure. The estimated right ventricular systolic pressure is 36.6 mmHg.   3. Left atrial size was moderately dilated.   4. Right atrial size was moderately dilated.   5. The mitral valve is abnormal. Mild to moderate mitral valve  regurgitation. Moderate mitral annular calcification.   6. The aortic valve is tricuspid. Aortic valve regurgitation is not  visualized.   7. Aortic dilatation noted. There is mild dilatation of the ascending  aorta, measuring 44 mm.   8. The inferior vena cava is normal in size with greater than 50%  respiratory variability, suggesting right atrial pressure of 3 mmHg.   Comparison(s): Changes from prior study are noted. 01/03/2022: LVEF 35-40%.   Epic records are reviewed at length today.  CHA2DS2-VASc Score = 5  The patient's score is based upon: CHF History: 1 HTN History: 1 Diabetes History: 0 Stroke History: 0 Vascular Disease History: 1 Age Score: 2 Gender Score: 0       ASSESSMENT AND PLAN: Paroxysmal Atrial Flutter The patient's CHA2DS2-VASc score is 5, indicating a 7.2% annual risk of stroke.   S/p successful DCCV on 6/6.   He is in NSR today.  Discussion of options going forward for treatment. After discussion, patient would like to proceed with conservative observation. If he has another episode of arrhythmia he would like  to see Dr. [name] again to discuss ablation. This is reasonable and we will continue with no medication changes.  Secondary Hypercoagulable State (ICD10:  D68.69) The patient is at significant risk for stroke/thromboembolism based upon his CHA2DS2-VASc Score of 5.  Continue Apixaban (Eliquis).  No missed doses.   HTN Stable today no changes.  Follow up Afib clinic PRN   Lake Bells, PA-C Afib Clinic [facility] [address] [place], [place] [address] [phone] 10/23/2022 2:25 PM

## 2023-01-04 ENCOUNTER — Other Ambulatory Visit: Payer: Self-pay | Admitting: Cardiovascular Disease

## 2023-01-18 ENCOUNTER — Other Ambulatory Visit: Payer: Self-pay | Admitting: Cardiovascular Disease

## 2023-01-20 IMAGING — CR DG CHEST 2V
2 series · 2 of 2 positions shown · non-contrast
Comparison: 02/21/2021

CLINICAL DATA: Status post CABG. Follow-up infiltrate and effusion.

EXAM:
CHEST - 2 VIEW

[w chest pa *]
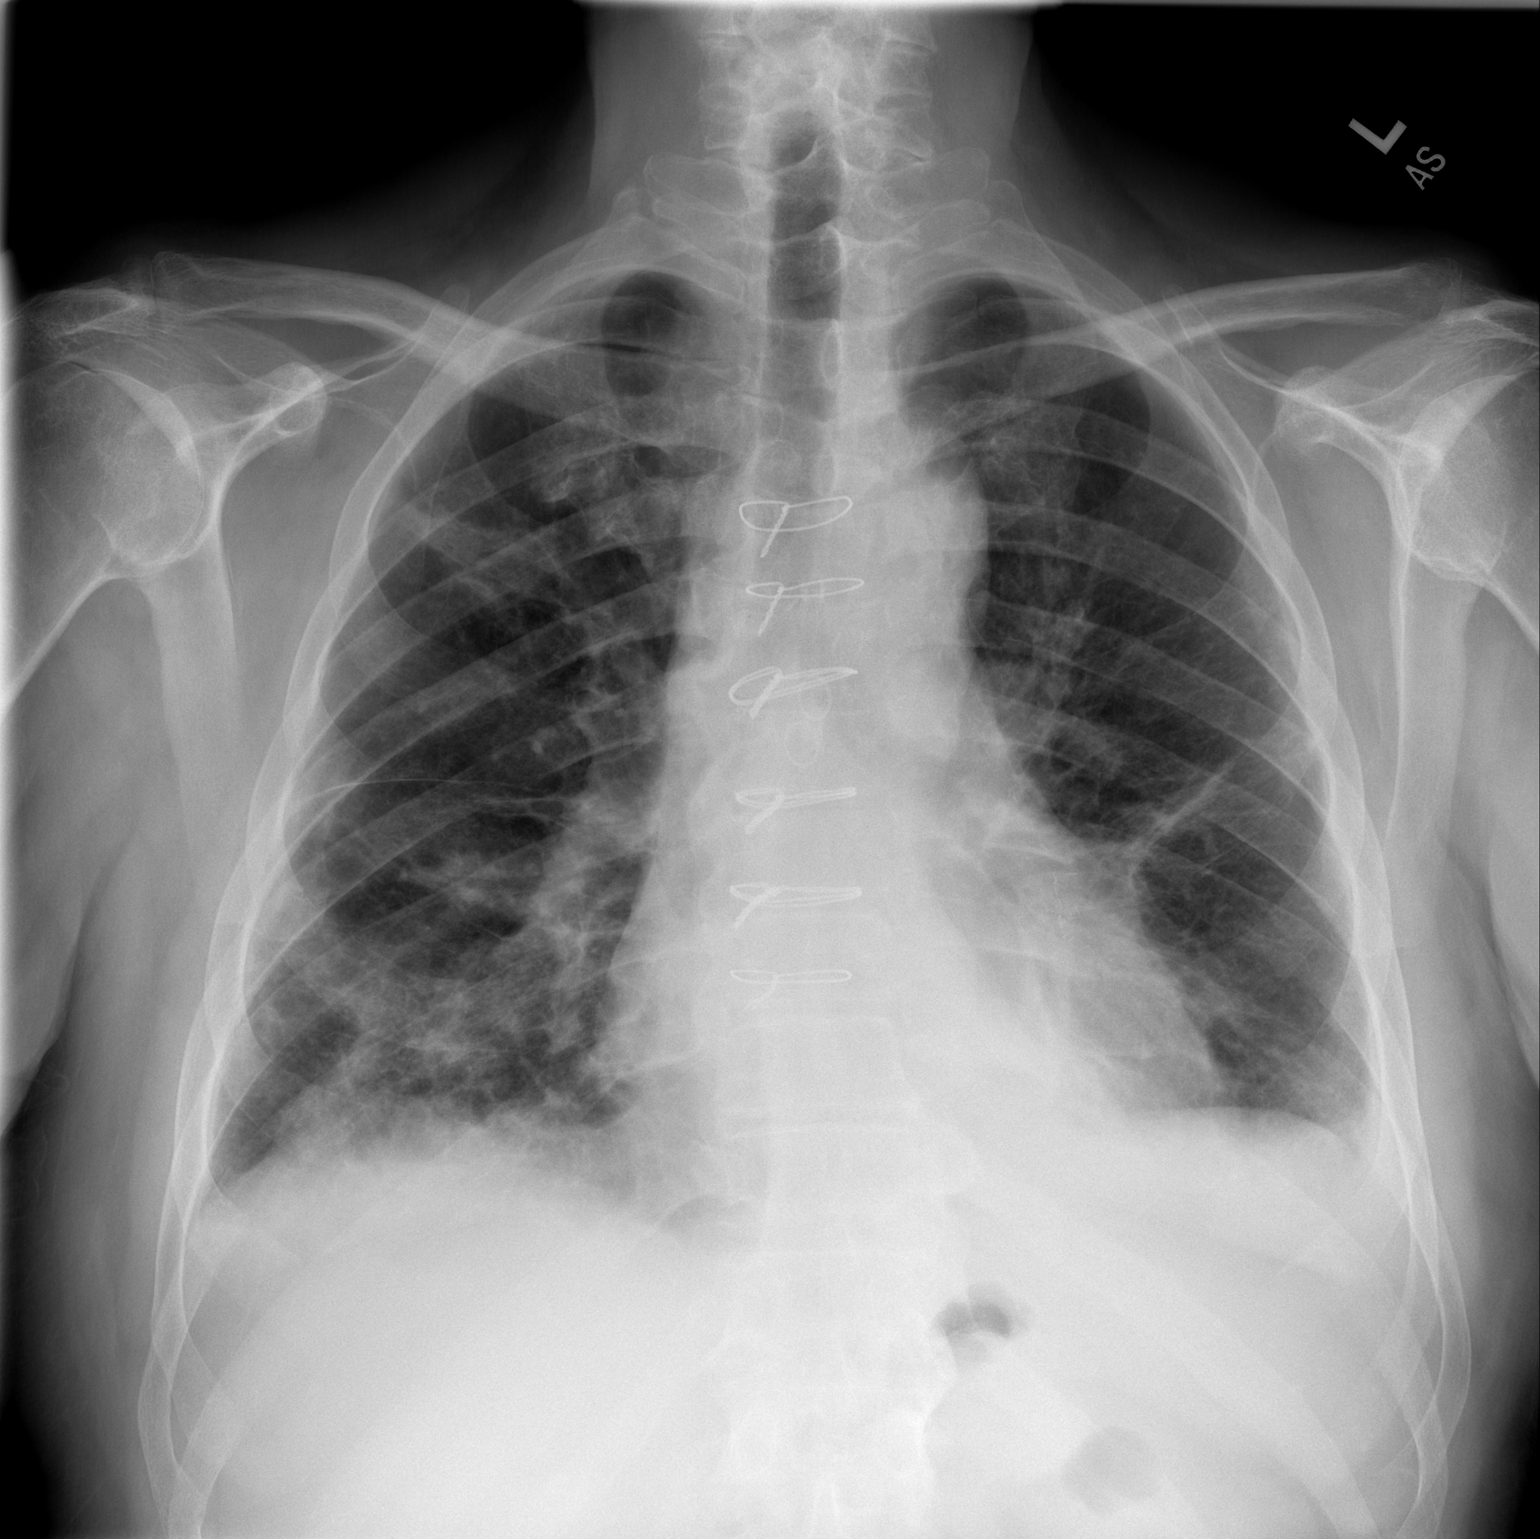

[w chest lat *]
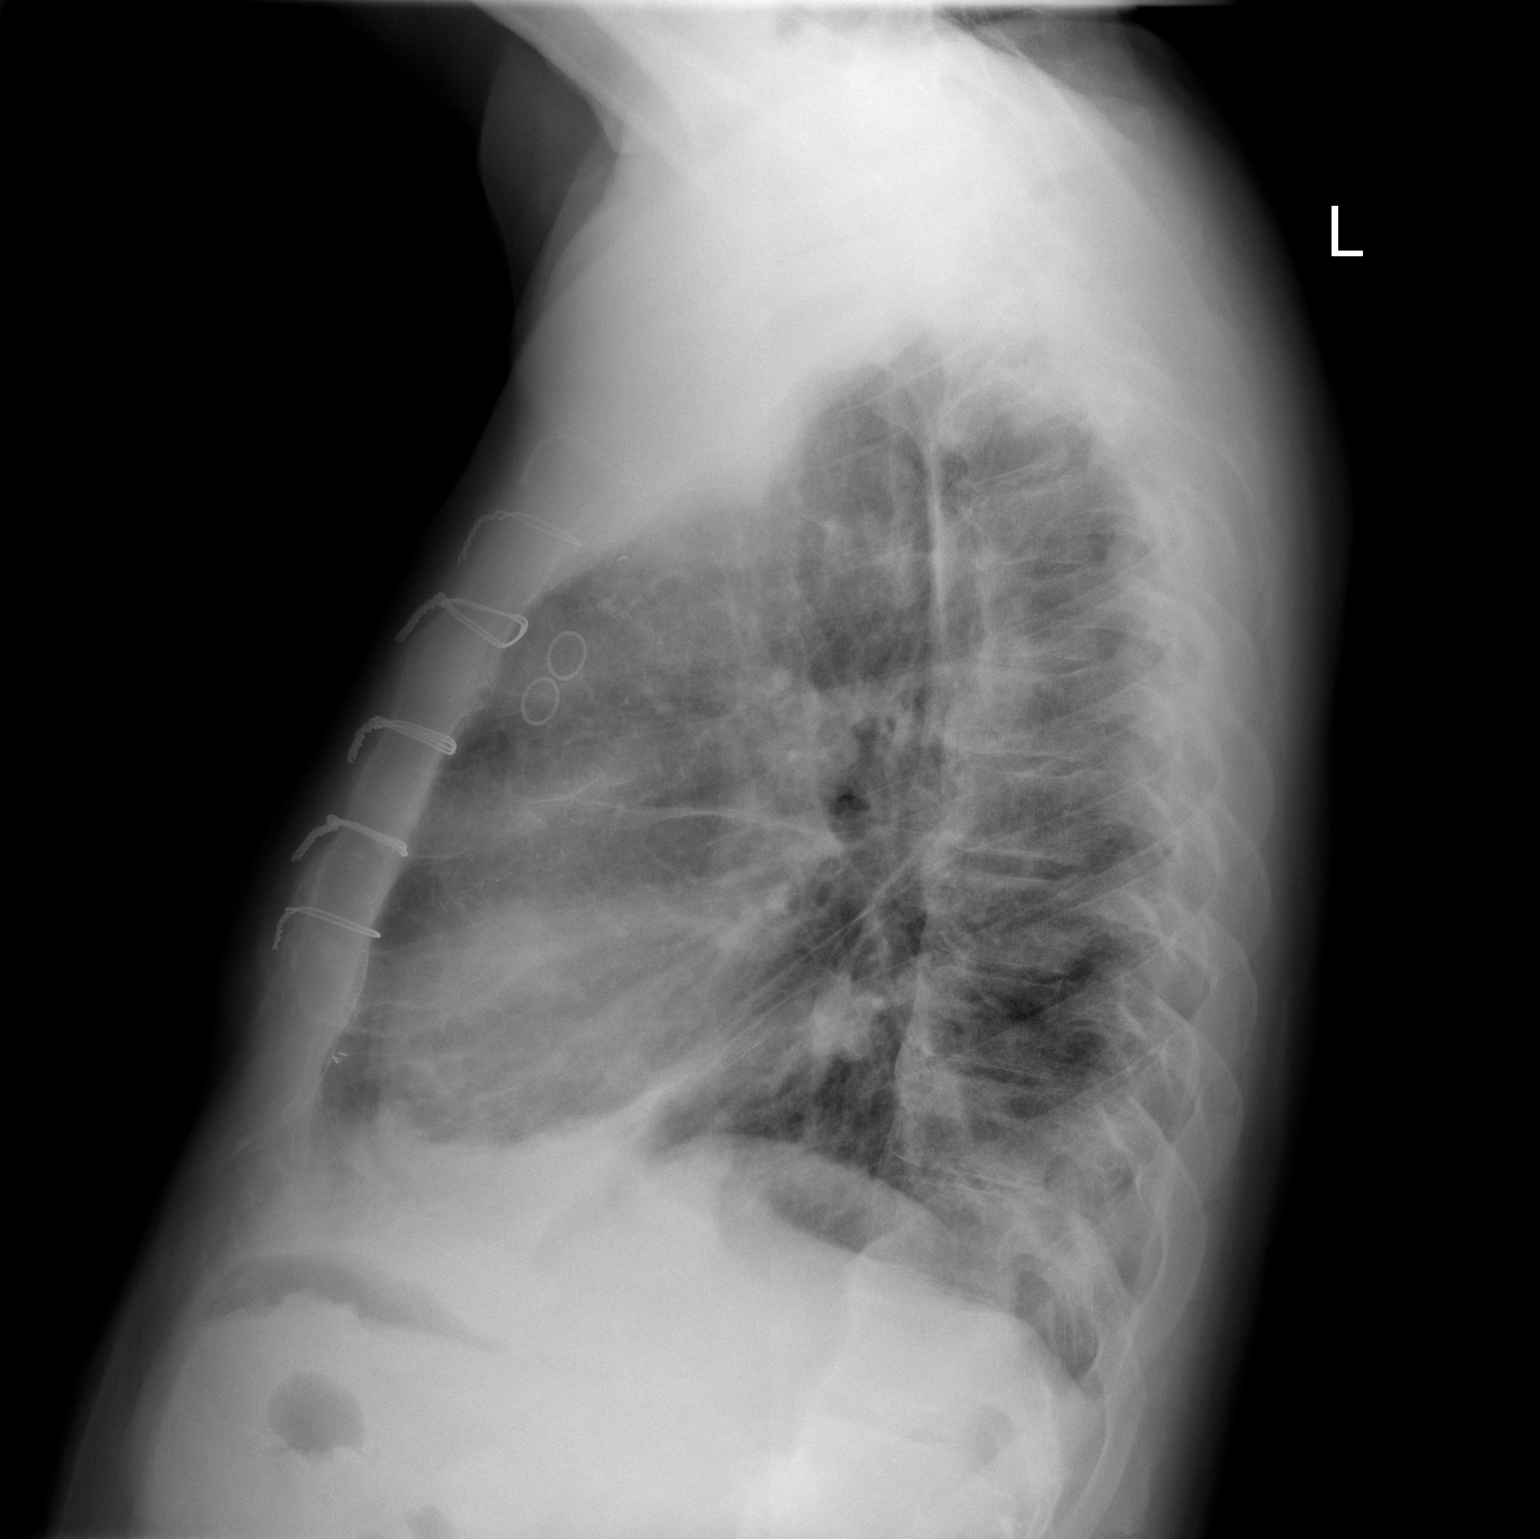

[2 of 2 positions shown; findings below may reference images not displayed]

FINDINGS: status post median sternotomy and CABG procedure. Stable
cardiomediastinal contours. Decreased volume of left pleural
effusion. Small right effusion unchanged. Scar within the left
midlung is stable. Persistent atelectasis within the retrocardiac
left lower lobe. Persistent airspace opacities within the right
upper lobe and right lower lobe.
IMPRESSION: 1. Decreased volume of left pleural effusion.
2. Persistent airspace opacities within the right upper lobe and
right lower lobe.

## 2023-01-28 DIAGNOSIS — I5022 Chronic systolic (congestive) heart failure: Secondary | ICD-10-CM | POA: Diagnosis not present

## 2023-01-28 DIAGNOSIS — Z23 Encounter for immunization: Secondary | ICD-10-CM | POA: Diagnosis not present

## 2023-01-28 DIAGNOSIS — D6869 Other thrombophilia: Secondary | ICD-10-CM | POA: Diagnosis not present

## 2023-01-28 DIAGNOSIS — R300 Dysuria: Secondary | ICD-10-CM | POA: Diagnosis not present

## 2023-01-28 DIAGNOSIS — N401 Enlarged prostate with lower urinary tract symptoms: Secondary | ICD-10-CM | POA: Diagnosis not present

## 2023-01-28 DIAGNOSIS — R7303 Prediabetes: Secondary | ICD-10-CM | POA: Diagnosis not present

## 2023-01-28 DIAGNOSIS — I4892 Unspecified atrial flutter: Secondary | ICD-10-CM | POA: Diagnosis not present

## 2023-01-28 DIAGNOSIS — N39498 Other specified urinary incontinence: Secondary | ICD-10-CM | POA: Diagnosis not present

## 2023-02-22 ENCOUNTER — Telehealth: Payer: Self-pay | Admitting: Cardiovascular Disease

## 2023-02-22 NOTE — Telephone Encounter (Signed)
Spoke with Patient who state he keeps having reoccurring UTI's since starting [place]. Pt recently treated for UTI with Augmentin which he finished about 1 week ago. Pt report he doesn't think the antibiotic helped because he is still having symptoms. He was also prescribed Myrbetriq 25 mg on 9/19. Pt states his current dose of Farxiga on 2.5 mg which he say was decreased from 5 mg. Pt stated he would like to see if there are other options available besides the [place]. Advised patient to reach out to PCP to have urine rechecked.  He is also waiting on an appt for urology clinic. Please advise.

## 2023-02-22 NOTE — Telephone Encounter (Signed)
Pt c/o medication issue:  1. Name of Medication:   FARXIGA 10 MG TABS tablet    2. How are you currently taking this medication (dosage and times per day)?  TAKE ONE TABLET BY MOUTH DAILY BEFORE BREAKFASTPatient taking differently: Take 5 mg by mouth daily.       3. Are you having a reaction (difficulty breathing--STAT)? No  4. What is your medication issue? Pt is requesting a callback regarding this medication causing him to have urinary infections. Pt would like to discuss other options since he's been having to see his pcp to get antibiotics. Please advise

## 2023-03-03 NOTE — Telephone Encounter (Signed)
If recurrent UTI, will need to DC [place]

## 2023-03-04 NOTE — Telephone Encounter (Signed)
Message relayed to patient. Patient will discontinue medications as instructed. No questions at this time.

## 2023-03-07 ENCOUNTER — Ambulatory Visit: Payer: Medicare HMO | Admitting: Neurology

## 2023-03-28 DIAGNOSIS — N401 Enlarged prostate with lower urinary tract symptoms: Secondary | ICD-10-CM | POA: Diagnosis not present

## 2023-03-28 DIAGNOSIS — R3915 Urgency of urination: Secondary | ICD-10-CM | POA: Diagnosis not present

## 2023-04-02 ENCOUNTER — Ambulatory Visit: Payer: Medicare HMO | Attending: Cardiovascular Disease | Admitting: Cardiovascular Disease

## 2023-04-02 ENCOUNTER — Encounter: Payer: Self-pay | Admitting: Cardiovascular Disease

## 2023-04-02 VITALS — BP 120/78 | HR 56 | Ht 74.0 in | Wt 245.4 lb

## 2023-04-02 DIAGNOSIS — I4892 Unspecified atrial flutter: Secondary | ICD-10-CM | POA: Diagnosis not present

## 2023-04-02 DIAGNOSIS — I5043 Acute on chronic combined systolic (congestive) and diastolic (congestive) heart failure: Secondary | ICD-10-CM

## 2023-04-02 DIAGNOSIS — I2102 ST elevation (STEMI) myocardial infarction involving left anterior descending coronary artery: Secondary | ICD-10-CM

## 2023-04-02 DIAGNOSIS — Z8679 Personal history of other diseases of the circulatory system: Secondary | ICD-10-CM | POA: Diagnosis not present

## 2023-04-02 DIAGNOSIS — E785 Hyperlipidemia, unspecified: Secondary | ICD-10-CM | POA: Diagnosis not present

## 2023-04-02 DIAGNOSIS — I255 Ischemic cardiomyopathy: Secondary | ICD-10-CM

## 2023-04-02 DIAGNOSIS — I10 Essential (primary) hypertension: Secondary | ICD-10-CM

## 2023-04-02 DIAGNOSIS — E7841 Elevated Lipoprotein(a): Secondary | ICD-10-CM | POA: Diagnosis not present

## 2023-04-02 DIAGNOSIS — I480 Paroxysmal atrial fibrillation: Secondary | ICD-10-CM | POA: Diagnosis not present

## 2023-04-02 DIAGNOSIS — I5021 Acute systolic (congestive) heart failure: Secondary | ICD-10-CM

## 2023-04-02 DIAGNOSIS — Z951 Presence of aortocoronary bypass graft: Secondary | ICD-10-CM

## 2023-04-02 MED ORDER — SACUBITRIL-VALSARTAN 49-51 MG PO TABS
1.0000 | ORAL_TABLET | Freq: Two times a day (BID) | ORAL | Status: DC
Start: 2023-04-02 — End: 2023-04-08

## 2023-04-02 NOTE — Progress Notes (Signed)
Cardiology Office Note    Date:  04/10/2023   ID:  Timothy Munoz, DOB 10/03/1945, MRN [id number]  PCP:  [facility]  Cardiologist:  [name], MD   8 month F/u office visit with me   History of Present Illness:  Timothy Munoz is a 77 y.o. male who suffered an anterolateral ST segment elevation myocardial infarction in September 2022 and underwent initial balloon angioplasty of his ostial LAD.  His course was complicated by cardiogenic shock with reduced LV function with EF at 35%.  He required intra-aortic balloon counterpulsation for hemodynamic support and ultimately underwent CABG revascularization surgery x3 in January 20, 2021 by Dr. Dorris Fetch.  He developed postoperative atrial fibrillation requiring amiodarone and was also treated with digoxin.  An echo Doppler study on January 25, 2021 showed slight improvement in LV function with EF at 40 to 45% with grade 2 diastolic dysfunction.  He was on furosemide for volume overload.  He was subsequently seen in the office setting by Edd Fabian, NP on May 26, 2020 and his Lasix was changed to as needed.  Repeat echo Doppler study in June 06, 2021 showed EF 35 to 40% with moderate PA systolic pressure elevation at 53.4 mm, biatrial enlargement, and moderate to severe MR.  He was started on Farxiga with subsequent dose reduction due to creatinine elevation.  He was seen in April 2023 by Azalee Course, PA and was transition from ARB therapy to Entresto 24/26 mg twice a day.  He was hospitalized on May 7 with discharge Sep 21, 2021 increasing dyspnea and 5 pound weight gain in addition to significant lower extremity edema.  He was treated with IV furosemide and an echo showed further reduction of EF at 26% on Sep 18, 2021.  He was in atrial flutter and on May 10 underwent TEE guided cardioversion with restoration of sinus rhythm and plan for  subsequent EP evaluation was recommended with Dr. [name].  I saw him on  Sep 27, 2021 at which time he felt well and denied chest pain or shortness of breath.  He was on carvedilol 6.25 mg twice a day, Entresto 24/26 mg twice a day, as needed furosemide, Farxiga 5 mg, and is on Plaquenil 75 mg and Eliquis 5 mg twice a day.  During that evaluation, his blood pressure remained low precluding further dose titration of his guideline directed medical therapy.  Subsequent laboratory was recommended.  He saw Dr. [name] on Oct 06, 2021 and given a single episode of atrial flutter he was recommended to continue monitoring.  Since I last saw him, he has been evaluated by Azalee Course, PA-C on several occasions with his last being in August 2023.  He had worn a monitor which showed 2 episodes of nonsustained VT the longest only 7 beats with 8 brief episodes of SVT.    Presently, I last saw him on March 12, 2022.  On February 20, 2022 he underwent bilateral eyelid surgery at [facility].  Currently he has been on Eliquis 5 mg twice a day, Plavix 75 mg daily and continues to be on Entresto 24/26 mg twice a day, Farxiga 5 mg daily, spironolactone 12.5 mg every third day.  He he is on rosuvastatin 20 mg daily.  He denies chest pain or shortness of breath.  He denies any dizziness or palpitations.  During that evaluation I had recommended that he undergo a follow-up echo Doppler study in February 2024 for reassessment of  LV function and his mitral regurgitation.  On June 19, 2022 a 2D echo Doppler study demonstrated EF at 30 to 35% with severe akinesis of the mid apical anterior wall, anteroseptal wall and apical segment.  He had mild LVH with grade 2 diastolic dysfunction.  There was moderate biatrial enlargement.  He had mild to moderate mitral regurgitation with moderate mitral annular calcification.  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  I last saw him in August 14, 2022.  He denied any chest pain or shortness of breath.  He has been experiencing intermittent rare  nosebleeds.  He continues to be on Eliquis for anticoagulation and reduced aspirin to 81 mg.  He is on carvedilol 6.25 mg twice a day, Entresto 24/26 mg twice a day, Farxiga 10 mg daily in addition to spironolactone 12.5 mg every third day.  He continues to be on rosuvastatin 20 mg daily.  He has not had recent laboratory.  During that evaluation I recommended he reduce his aspirin to 81 mg every other day.  He presented to the emergency room on October 18, 2022 with increasing shortness of breath and was found to be in atrial fibrillation/flutter.  In the ER he went successful DC cardioversion with 200 J converting him to sinus rhythm.  Presently, he feels well but has had issues with recurrent urinary tract infections.  As result, he is now off [place].  He has been walking 3 miles a day.  He denies chest pain or shortness of breath.  He continues to be on carvedilol 6.25 mg twice a day, Eliquis 5 mg twice a day, aspirin 81 mg every other day, Zetia 10 mg, rosuvastatin 40 mg, Entresto 24/26 mg twice a day in addition to Flomax.  He presents for evaluation.   Past Medical History:  Diagnosis Date   Arthritis    Blood dyscrasia    CHF (congestive heart failure) (HCC)    Coronary artery disease    Dysrhythmia    post-CABG PAF; aflutter, s/p DCCV 09/20/21   HFrEF (heart failure with reduced ejection fraction) (HCC)    Hyperlipidemia    mild    Hypertension    Knee pain    Myocardial infarction (HCC)    Obesity    Tremor    Tremor, essential 03/04/2019    Past Surgical History:  Procedure Laterality Date   APPENDECTOMY     BACK SURGERY     BROW LIFT Bilateral 09/14/2022   Procedure: BLEPHAROPLASTY;  Surgeon: Dairl Ponder, MD;  Location: [facility] OR;  Service: Ophthalmology;  Laterality: Bilateral;   CARDIAC CATHETERIZATION     CARDIOVERSION N/A 09/20/2021   Procedure: CARDIOVERSION;  Surgeon: Jake Bathe, MD;  Location: [facility] ENDOSCOPY;  Service: Cardiovascular;  Laterality: N/A;   CORONARY  ARTERY BYPASS GRAFT N/A 01/20/2021   Procedure: CORONARY ARTERY BYPASS GRAFTING (CABG) X 3 ON PUMP, USING LEFT INTERNAL MAMMARY ARTERY AND LEFT ENDOSCOPIC GREATER SAPHENOUS VEIN HARVEST CONDUITS;  Surgeon: [name], MD;  Location: [facility] OR;  Service: Open Heart Surgery;  Laterality: N/A;   CORONARY/GRAFT ACUTE MI REVASCULARIZATION N/A 01/16/2021   Procedure: Coronary/Graft Acute MI Revascularization;  Surgeon: Lennette Bihari, MD;  Location: [facility] INVASIVE CV LAB;  Service: Cardiovascular;  Laterality: N/A;   ECTROPION REPAIR Bilateral 02/23/2022   Procedure: REPAIR OF ECTROPION;  Surgeon: Dairl Ponder, MD;  Location: [facility] OR;  Service: Ophthalmology;  Laterality: Bilateral;   ENDOVEIN HARVEST OF GREATER SAPHENOUS VEIN Left 01/20/2021   Procedure:  ENDOVEIN HARVEST OF GREATER SAPHENOUS VEIN;  Surgeon: [name], MD;  Location: [facility] OR;  Service: Open Heart Surgery;  Laterality: Left;   EYE SURGERY     HEMORRHOID SURGERY     IABP INSERTION N/A 01/16/2021   Procedure: IABP Insertion;  Surgeon: Lennette Bihari, MD;  Location: [facility] INVASIVE CV LAB;  Service: Cardiovascular;  Laterality: N/A;   LEFT HEART CATH AND CORONARY ANGIOGRAPHY N/A 01/16/2021   Procedure: LEFT HEART CATH AND CORONARY ANGIOGRAPHY;  Surgeon: Lennette Bihari, MD;  Location: [facility] INVASIVE CV LAB;  Service: Cardiovascular;  Laterality: N/A;   TEE WITHOUT CARDIOVERSION N/A 01/20/2021   Procedure: TRANSESOPHAGEAL ECHOCARDIOGRAM (TEE);  Surgeon: [name], MD;  Location: [facility] OR;  Service: Open Heart Surgery;  Laterality: N/A;   TEE WITHOUT CARDIOVERSION N/A 09/20/2021   Procedure: TRANSESOPHAGEAL ECHOCARDIOGRAM (TEE);  Surgeon: Jake Bathe, MD;  Location: [facility] ENDOSCOPY;  Service: Cardiovascular;  Laterality: N/A;    Current Medications: Outpatient Medications Prior to Visit  Medication Sig Dispense Refill   apixaban (ELIQUIS) 5 MG TABS tablet TAKE ONE TABLET BY MOUTH TWICE DAILY 180 tablet 1   aspirin EC  81 MG tablet Take 1 tablet (81 mg total) by mouth every other day. Swallow whole.     carvedilol (COREG) 6.25 MG tablet TAKE ONE TABLET BY MOUTH TWICE DAILY WITH A MEAL 180 tablet 3   cephALEXin (KEFLEX) 500 MG capsule Take 500 mg by mouth 2 (two) times daily.     ezetimibe (ZETIA) 10 MG tablet Take 1 tablet (10 mg total) by mouth daily. 90 tablet 3   rosuvastatin (CRESTOR) 40 MG tablet Take 1 tablet (40 mg total) by mouth daily. 90 tablet 3   tamsulosin (FLOMAX) 0.4 MG CAPS capsule Take 0.4 mg by mouth daily.     sacubitril-valsartan (ENTRESTO) 24-26 MG TAKE ONE TABLET BY MOUTH TWICE DAILY 60 tablet 6   FARXIGA 10 MG TABS tablet TAKE ONE TABLET BY MOUTH DAILY BEFORE BREAKFAST (Patient not taking: Reported on 04/02/2023) 30 tablet 6   No facility-administered medications prior to visit.     Allergies:   Patient has no known allergies.   Social History   Socioeconomic History   Marital status: Single    Spouse name: Not on file   Number of children: 2   Years of education: 13   Highest education level: High school graduate  Occupational History   Occupation: retired    Comment: Pharmacist, hospital  Tobacco Use   Smoking status: Former   Smokeless tobacco: Former    Types: Chew    Quit date: 01/16/2021  Vaping Use   Vaping status: Never Used  Substance and Sexual Activity   Alcohol use: Not Currently    Comment: one beer every two months   Drug use: No   Sexual activity: Not on file  Other Topics Concern   Not on file  Social History Narrative   Right handed    Social Determinants of Health   Financial Resource Strain: Not on file  Food Insecurity: Not on file  Transportation Needs: Not on file  Physical Activity: Not on file  Stress: Not on file  Social Connections: Not on file    He is retired and was in the plumbing and Pharmacist, hospital business.  He worked for over 40 years.  Prior tobacco history, quit smoking over 30 years ago  Family History:  The  patient's family history includes Alzheimer's disease in his mother; Hypertension in  his father and mother; Parkinson's disease in his mother; Suicidality in his father.   ROS General: Negative; No fevers, chills, or night sweats;  HEENT: Negative; No changes in vision or hearing, sinus congestion, difficulty swallowing Pulmonary: Negative; No cough, wheezing, shortness of breath, hemoptysis Cardiovascular: See HPI GI: Negative; No nausea, vomiting, diarrhea, or abdominal pain GU: Frequent UTIs, now off [place] Musculoskeletal: Negative; no myalgias, joint pain, or weakness Hematologic/Oncology: Negative; no easy bruising, bleeding Endocrine: Negative; no heat/cold intolerance; no diabetes Neuro: Negative; no changes in balance, headaches Skin: Negative; No rashes or skin lesions Psychiatric: Negative; No behavioral problems, depression Sleep: Negative; No snoring, daytime sleepiness, hypersomnolence, bruxism, restless legs, hypnogognic hallucinations, no cataplexy Other comprehensive 14 point system review is negative.   PHYSICAL EXAM:   VS:  BP 120/78   Pulse (!) 56   Ht 6\' 2"  (1.88 m)   Wt 245 lb 6.4 oz (111.3 kg)   SpO2 96%   BMI 31.51 kg/m     Repeat blood pressure by me was 122/72  Wt Readings from Last 3 Encounters:  04/02/23 245 lb 6.4 oz (111.3 kg)  10/23/22 240 lb 9.6 oz (109.1 kg)  09/14/22 240 lb (108.9 kg)   General: Alert, oriented, no distress.  Skin: normal turgor, no rashes, warm and dry HEENT: Normocephalic, atraumatic. Pupils equal round and reactive to light; sclera anicteric; extraocular muscles intact;  Nose without nasal septal hypertrophy Mouth/Parynx benign; Mallinpatti scale 3 Neck: No JVD, no carotid bruits; normal carotid upstroke Lungs: clear to ausculatation and percussion; no wheezing or rales Chest wall: without tenderness to palpitation Heart: PMI not displaced, RRR, s1 s2 normal, 1/6 systolic murmur, no diastolic murmur, no rubs, gallops,  thrills, or heaves Abdomen: soft, nontender; no hepatosplenomehaly, BS+; abdominal aorta nontender and not dilated by palpation. Back: no CVA tenderness Pulses 2+ Musculoskeletal: full range of motion, normal strength, no joint deformities Extremities: no clubbing cyanosis or edema, Homan's sign negative  Neurologic: grossly nonfocal; Cranial nerves grossly wnl Psychologic: Normal mood and affect   Studies/Labs Reviewed:   April 02, 2023 ECG (independently read by me): Sinus bradycardia at 56 with first-degree block PR interval 222.  QS V1 V2 lead I and aVL.  August 09, 2022 ECG (independently read by me): Sinus bradycardia at 56, Q V1-2, I aVL    March 12, 2022 ECG (independently read by me): NSR at 66, QS V1-2; I,aVL; PRWP   Sep 27, 2021 ECG (independently read by me): Sinus rhythm at 70 bpm with first-degree AV block, PR interval 260 ms.  QS complex V1 through V5 consistent with prior anterior MI.  QTc increased at 473 ms.  Recent Labs:    Latest Ref Rng & Units 10/18/2022    2:16 PM 09/27/2022    8:33 AM 09/14/2022    9:20 AM  BMP  Glucose 70 - 99 mg/dL 83  87  92   BUN 8 - 23 mg/dL 22  20  29    Creatinine 0.61 - 1.24 mg/dL 0.98  1.19  1.47   BUN/Creat Ratio 10 - 24  17    Sodium 135 - 145 mmol/L 140  140  142   Potassium 3.5 - 5.1 mmol/L 3.7  5.1  4.7   Chloride 98 - 111 mmol/L 115  106  114   CO2 22 - 32 mmol/L 20  22  20    Calcium 8.9 - 10.3 mg/dL 7.5  9.3  8.9         Latest Ref  Rng & Units 09/06/2022    8:16 AM 08/15/2022    8:08 AM 09/27/2021   10:50 AM  Hepatic Function  Total Protein 6.0 - 8.5 g/dL 6.8  6.6  6.7   Albumin 3.8 - 4.8 g/dL 4.4  4.1  3.9   AST 0 - 40 IU/L 20  20  26    ALT 0 - 44 IU/L 16  14  21    Alk Phosphatase 44 - 121 IU/L 167  163  153   Total Bilirubin 0.0 - 1.2 mg/dL 0.3  0.2  0.4        Latest Ref Rng & Units 10/18/2022   11:22 AM 08/15/2022    8:08 AM 03/21/2022    4:02 PM  CBC  WBC 4.0 - 10.5 K/uL 7.1  6.8  7.1   Hemoglobin 13.0 -  17.0 g/dL 96.0  45.4  09.8   Hematocrit 39.0 - 52.0 % 41.0  41.3  36.2   Platelets 150 - 400 K/uL 282  253  205    Lab Results  Component Value Date   MCV 86.0 10/18/2022   MCV 85 08/15/2022   MCV 82 03/21/2022   Lab Results  Component Value Date   TSH 2.030 08/15/2022   Lab Results  Component Value Date   HGBA1C 6.0 (H) 08/15/2022     BNP    Component Value Date/Time   BNP 510.7 (H) 10/18/2022 1122    ProBNP    Component Value Date/Time   PROBNP 3,278 (H) 09/27/2021 1050     Lipid Panel     Component Value Date/Time   CHOL 145 09/06/2022 0816   TRIG 96 09/06/2022 0816   HDL 35 (L) 09/06/2022 0816   CHOLHDL 4.1 09/06/2022 0816   CHOLHDL 3.9 01/16/2021 0739   VLDL 7 01/16/2021 0739   LDLCALC 92 09/06/2022 0816   LABVLDL 18 09/06/2022 0816     RADIOLOGY: No results found.   Additional studies/ records that were reviewed today include:   I have reviewed his extensive records since his anterolateral STEMI on January 20, 2021 with subsequent echoes and evaluations.    Mid LM to Dist LM lesion is 20% stenosed.   Ost LAD to Prox LAD lesion is 100% stenosed.   Ost Cx lesion is 90% stenosed.   Ost RCA lesion is 20% stenosed.   Post intervention, there is a 60% residual stenosis.   There is moderate left ventricular systolic dysfunction.   LV end diastolic pressure is moderately elevated.   The left ventricular ejection fraction is 35-45% by visual estimate.   Acute anterolateral myocardial infarction secondary to total near ostial occlusion of a large LAD which wraps around the LV apex.   Large normal ramus intermediate vessel.   Left circumflex vessel with 85 to 90% ostial stenosis.   Large RCA with 20% irregularity proximally.   PTCA of the LAD ostium with hypotension and idioventricular rhythm with reperfusion requiring initiation of Levophed reestablishment of TIMI-3 flow to a large LAD vessel which wraps around the LV apex.   Insertion of an  intra-aortic balloon pump for optimal hemodynamic support with augmented diastolic pressure at 150 millimeters mercury.   RECOMMENDATION: In light of the patient's complex anatomy, stenting was not performed due to high likelihood of jailing a very large ramus intermediate vessel and with concomitant ostial circumflex stenosis it was felt that surgical revascularization once patient stabilizes from his myocardial infarction would be optimal therapy.  He did receive 1 dose of  oral Brilinta in the laboratory which will not be continued.  We will plan 2D echo Doppler today.  Initiate aggressive lipid-lowering therapy.  Dr. Cliffton Asters was contacted who will see the patient and depending upon the patient;s hemodynamic stability perform CABG revascularization surgery this week following washout of his initial Brilinta dose.   Intervention  I   TEE: 09/20/2021  1. Left ventricular ejection fraction, by estimation, is 20 to 25%. The  left ventricle has severely decreased function. The left ventricle has no  regional wall motion abnormalities.   2. Right ventricular systolic function is normal. The right ventricular  size is normal.   3. No left atrial/left atrial appendage thrombus was detected.   4. PISA radius 0.4 cm      Annular diameter 3.07 cm      Mal Coaptation 0.26 cm      MR ERO 0.13 cm2      MR Vol 12 ml      Normal pulmonary vein flow. The mitral valve is degenerative. Moderate  mitral valve regurgitation. No evidence of mitral stenosis.   5. The aortic valve is normal in structure. Aortic valve regurgitation is  not visualized. No aortic stenosis is present.   6. There is mild (Grade II) plaque.   7. The inferior vena cava is normal in size with greater than 50%  respiratory variability, suggesting right atrial pressure of 3 mmHg.    ASSESSMENT:    1. STEMI involving left anterior descending coronary artery (HCC): 01/16/2021   2. S/P CABG x 3   3. Ischemic cardiomyopathy   4.  Acute on chronic combined systolic and diastolic CHF (congestive heart failure) (HCC)   5. Hyperlipidemia LDL goal <55   6. PAF (paroxysmal atrial fibrillation) (HCC)   7. History of atrial flutter   8. Primary hypertension   9. Elevated Lp(a)      PLAN:  Timothy Munoz is a 77 year old retired gentleman who suffered an anterolateral myocardial infarction on January 14, 2021 leading to cardiogenic shock.  He underwent balloon angioplasty of an ostial total LAD occlusion, intra-aortic balloon counterpulsation with plans for CABG revascularization surgery following Brilinta washout and stability.  He has been found to have a subsequent ischemic cardiomyopathy and has had issues with volume overload.  He has been transitioned to [facility] from ARB therapy and is also currently on carvedilol 6.25 mg twice a day, reduced dose Farxiga 5 mg daily, and continues to be on clopidogrel in addition to Eliquis anticoagulation.  He has LV dysfunction and his recent echo Doppler study during his readmission for acute on chronic CHF showed EF of 26%.  He was in atrial flutter and ultimately underwent successful TEE guided cardioversion on Sep 20, 2021.  Subsequently, he has been maintaining sinus rhythm.  A subsequent echo Doppler study was done on January 03, 2022 which continued to show EF of 35 to 40% with global dysfunction.  The ventricular internal cavity was moderately dilated.  It was noted that he had moderate to severe mitral regurgitation with restricted posterior leaflet.  At his October 2023 evaluation, his blood pressure was stable but remain low on his guideline directed medical therapy.  Presently, blood pressure is stable but remains low on his guideline directed medical therapy of carvedilol 6.25 mg twice a day, Farxiga at reduced dose 5 mg, Entresto 24/26 mg twice a day and spironolactone 12.5 mg every third day.  He is now 14 months following his STEMI.  He had bilateral eye  surgery and tolerated  that well.  He had held his Plavix.  During his October 2023 I recommended he discontinue Plavix and can institute aspirin 81 mg to take in addition to Eliquis particularly with his CAD.  He underwent a follow-up echo Doppler study for reassessment of LV function and MR on June 19, 2022.  This continues to show his ischemic cardiomyopathy with a EF stable at 30 to 35%.  There was severe akinesis of the mid apical anterior wall, anteroseptal and apical segment.  There was mild LVH and grade 2 diastolic dysfunction.  Pulmonary artery systolic pressure was mildly elevated at 36 mm.  There was moderate biatrial enlargement.  His mitral regurgitation was now felt to be mild to moderate.  Since his prior evaluation, he underwent DC cardioversion after presenting to the ER in atrial flutter/atrial fibrillation.  Due to recurrent urinary tract infections, he is no longer on Farxiga.  Presently he is on carvedilol 6.25 mg twice a day, Entresto 24/26 mg twice a day, and is no longer on spironolactone due to elevated potassium level leading to its discontinuance.  He is on Eliquis for anticoagulation 5 mg twice a day.  He is on Zetia and rosuvastatin for hyperlipidemia.  With his coronary artery disease, target LDL is less than 55.  Presently I am recommending slight titration of Entresto to 49/51 mg twice a day since he is no longer on Farxiga.  His ECG today shows sinus bradycardia with first-degree AV block at a ventricular rate of 56 and PR interval 222 ms.  He will be undergoing repeat laboratory with his primary physician.  I checked LP(a) in April 2024 which was elevated at 192.  If he continues to have LDL elevation, I would recommend PCSK9 initiation for aggressive lipid lowering.  3 months he will undergo repeat echo Doppler study.  I would recommend repeat fasting laboratory with competence of metabolic panel, lipid panel.  I will see him in 4 months for reevaluation or sooner as needed.    Medication  Adjustments/Labs and Tests Ordered: Current medicines are reviewed at length with the patient today.  Concerns regarding medicines are outlined above.  Medication changes, Labs and Tests ordered today are listed in the Patient Instructions below. Patient Instructions  Medication Instructions:  INCREASE THE ENTRESTO FROM 24-26MG  TO 49-51MG . TAKE 1 TABLET TWICE A DAY. *If you need a refill on your cardiac medications before your next appointment, please call your pharmacy*   Lab Work: NONE If you have labs (blood work) drawn today and your tests are completely normal, you will receive your results only by: MyChart Message (if you have MyChart) OR A paper copy in the mail If you have any lab test that is abnormal or we need to change your treatment, we will call you to review the results.   Testing/Procedures: Your physician has requested that you have an echocardiogram IN THREE MONTHS. Echocardiography is a painless test that uses sound waves to create images of your heart. It provides your doctor with information about the size and shape of your heart and how well your heart's chambers and valves are working. This procedure takes approximately one hour. There are no restrictions for this procedure. Please do NOT wear cologne, perfume, aftershave, or lotions (deodorant is allowed). Please arrive 15 minutes prior to your appointment time.  Please note: We ask at that you not bring children with you during ultrasound (echo/ vascular) testing. Due to room size and safety concerns, children are  not allowed in the ultrasound rooms during exams. Our front office staff cannot provide observation of children in our lobby area while testing is being conducted. An adult accompanying a patient to their appointment will only be allowed in the ultrasound room at the discretion of the ultrasound technician under special circumstances. We apologize for any inconvenience.    Follow-Up: At [facility], you and your health needs are our priority.  As part of our continuing mission to provide you with exceptional heart care, we have created designated Provider Care Teams.  These Care Teams include your primary Cardiologist (physician) and Advanced Practice Providers (APPs -  Physician Assistants and Nurse Practitioners) who all work together to provide you with the care you need, when you need it.  We recommend signing up for the patient portal called "MyChart".  Sign up information is provided on this After Visit Summary.  MyChart is used to connect with patients for Virtual Visits (Telemedicine).  Patients are able to view lab/test results, encounter notes, upcoming appointments, etc.  Non-urgent messages can be sent to your provider as well.   To learn more about what you can do with MyChart, go to [email or web address].    Your next appointment:   4 month(s)  Provider:   [name], MD       Signed, [name], MD  04/10/2023 9:19 AM    [facility] [address], [place], [place]  [address] Phone: [phone]

## 2023-04-02 NOTE — Patient Instructions (Signed)
Medication Instructions:  INCREASE THE ENTRESTO FROM 24-26MG  TO 49-51MG . TAKE 1 TABLET TWICE A DAY. *If you need a refill on your cardiac medications before your next appointment, please call your pharmacy*   Lab Work: NONE If you have labs (blood work) drawn today and your tests are completely normal, you will receive your results only by: MyChart Message (if you have MyChart) OR A paper copy in the mail If you have any lab test that is abnormal or we need to change your treatment, we will call you to review the results.   Testing/Procedures: Your physician has requested that you have an echocardiogram IN THREE MONTHS. Echocardiography is a painless test that uses sound waves to create images of your heart. It provides your doctor with information about the size and shape of your heart and how well your heart's chambers and valves are working. This procedure takes approximately one hour. There are no restrictions for this procedure. Please do NOT wear cologne, perfume, aftershave, or lotions (deodorant is allowed). Please arrive 15 minutes prior to your appointment time.  Please note: We ask at that you not bring children with you during ultrasound (echo/ vascular) testing. Due to room size and safety concerns, children are not allowed in the ultrasound rooms during exams. Our front office staff cannot provide observation of children in our lobby area while testing is being conducted. An adult accompanying a patient to their appointment will only be allowed in the ultrasound room at the discretion of the ultrasound technician under special circumstances. We apologize for any inconvenience.    Follow-Up: At [facility], you and your health needs are our priority.  As part of our continuing mission to provide you with exceptional heart care, we have created designated Provider Care Teams.  These Care Teams include your primary Cardiologist (physician) and Advanced Practice Providers  (APPs -  Physician Assistants and Nurse Practitioners) who all work together to provide you with the care you need, when you need it.  We recommend signing up for the patient portal called "MyChart".  Sign up information is provided on this After Visit Summary.  MyChart is used to connect with patients for Virtual Visits (Telemedicine).  Patients are able to view lab/test results, encounter notes, upcoming appointments, etc.  Non-urgent messages can be sent to your provider as well.   To learn more about what you can do with MyChart, go to [email or web address].    Your next appointment:   4 month(s)  Provider:   [name], MD

## 2023-04-04 ENCOUNTER — Telehealth: Payer: Self-pay | Admitting: Cardiovascular Disease

## 2023-04-04 NOTE — Telephone Encounter (Signed)
Hello pt calling in requesting refill on Entresto 49-51 which he states was supposed to have been sent in. It shows clinic administered therefore not allowing me to reorder. Please review and send refill if appropriate to [facility]. Thank you

## 2023-04-08 ENCOUNTER — Other Ambulatory Visit: Payer: Self-pay

## 2023-04-08 MED ORDER — SACUBITRIL-VALSARTAN 49-51 MG PO TABS: 1.0000 | ORAL_TABLET | Freq: Two times a day (BID) | ORAL | 3 refills | Status: DC

## 2023-04-08 NOTE — Telephone Encounter (Signed)
pt calling in requesting refill on Entresto 49-51 which he states was supposed to have been sent in. It shows clinic administered therefore not allowing me to reorder. Please review and send refill if appropriate to [facility].

## 2023-04-08 NOTE — Telephone Encounter (Signed)
Spoke to patient he stated he never received Entresto prescription.Advised Entresto 49/51 mg prescription sent to [facility] in [place].

## 2023-04-10 ENCOUNTER — Encounter: Payer: Self-pay | Admitting: Cardiovascular Disease

## 2023-04-13 ENCOUNTER — Other Ambulatory Visit: Payer: Self-pay | Admitting: Cardiovascular Disease

## 2023-04-13 DIAGNOSIS — I480 Paroxysmal atrial fibrillation: Secondary | ICD-10-CM

## 2023-04-14 NOTE — Telephone Encounter (Signed)
Prescription refill request for Eliquis received. Indication: a fib Last office visit: 04/02/23 Scr: 1.24 10/18/22 epic Age: 77 Weight: 111kg

## 2023-04-15 DIAGNOSIS — R3915 Urgency of urination: Secondary | ICD-10-CM | POA: Diagnosis not present

## 2023-04-15 DIAGNOSIS — N401 Enlarged prostate with lower urinary tract symptoms: Secondary | ICD-10-CM | POA: Diagnosis not present

## 2023-04-15 DIAGNOSIS — N5201 Erectile dysfunction due to arterial insufficiency: Secondary | ICD-10-CM | POA: Diagnosis not present

## 2023-05-13 DIAGNOSIS — Z0000 Encounter for general adult medical examination without abnormal findings: Secondary | ICD-10-CM | POA: Diagnosis not present

## 2023-05-13 DIAGNOSIS — I10 Essential (primary) hypertension: Secondary | ICD-10-CM | POA: Diagnosis not present

## 2023-05-13 DIAGNOSIS — I77810 Thoracic aortic ectasia: Secondary | ICD-10-CM | POA: Diagnosis not present

## 2023-05-13 DIAGNOSIS — I480 Paroxysmal atrial fibrillation: Secondary | ICD-10-CM | POA: Diagnosis not present

## 2023-05-13 DIAGNOSIS — D6869 Other thrombophilia: Secondary | ICD-10-CM | POA: Diagnosis not present

## 2023-05-13 DIAGNOSIS — E785 Hyperlipidemia, unspecified: Secondary | ICD-10-CM | POA: Diagnosis not present

## 2023-05-13 DIAGNOSIS — I5043 Acute on chronic combined systolic (congestive) and diastolic (congestive) heart failure: Secondary | ICD-10-CM | POA: Diagnosis not present

## 2023-05-23 ENCOUNTER — Ambulatory Visit: Payer: Medicare HMO | Admitting: Cardiovascular Disease

## 2023-05-23 DIAGNOSIS — M25561 Pain in right knee: Secondary | ICD-10-CM | POA: Diagnosis not present

## 2023-06-11 ENCOUNTER — Other Ambulatory Visit: Payer: Self-pay | Admitting: Cardiovascular Disease

## 2023-07-01 DIAGNOSIS — M25561 Pain in right knee: Secondary | ICD-10-CM | POA: Diagnosis not present

## 2023-07-03 ENCOUNTER — Ambulatory Visit (HOSPITAL_COMMUNITY): Payer: Medicare HMO | Attending: Cardiovascular Disease

## 2023-07-03 DIAGNOSIS — I5043 Acute on chronic combined systolic (congestive) and diastolic (congestive) heart failure: Secondary | ICD-10-CM | POA: Diagnosis not present

## 2023-07-03 LAB — ECHOCARDIOGRAM COMPLETE
Area-P 1/2: 3.17 cm2
S' Lateral: 4.5 cm

## 2023-07-03 MED ORDER — PERFLUTREN LIPID MICROSPHERE
1.0000 mL | INTRAVENOUS | Status: AC | PRN
Start: 2023-07-03 — End: 2023-07-03
  Administered 2023-07-03: 2 mL via INTRAVENOUS

## 2023-08-02 IMAGING — CR DG CHEST 2V
2 series · 2 of 2 positions shown · non-contrast
Comparison: 03/07/2021 and prior studies

CLINICAL DATA: Shortness of breath

EXAM:
CHEST - 2 VIEW

[chest pa]
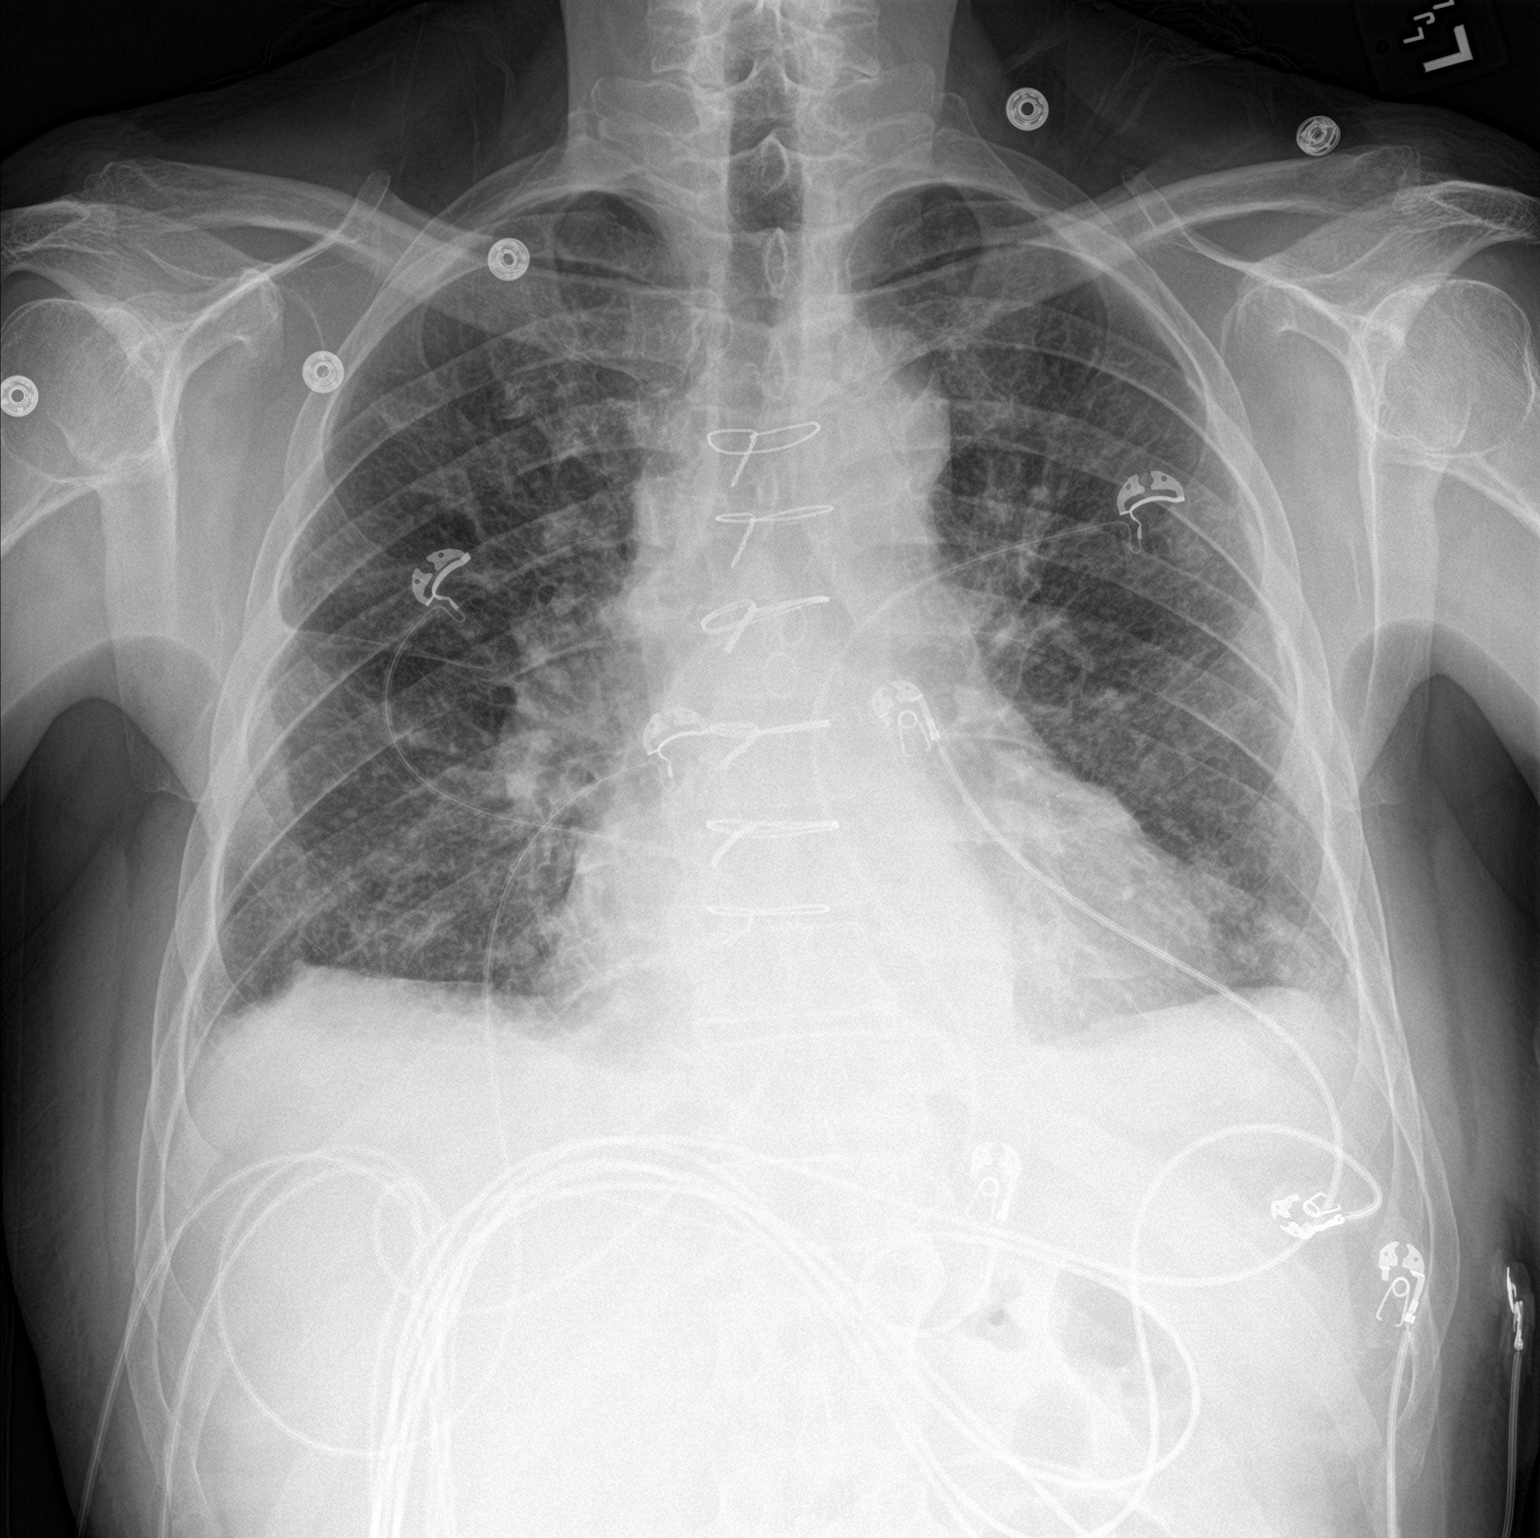

[chest lat]
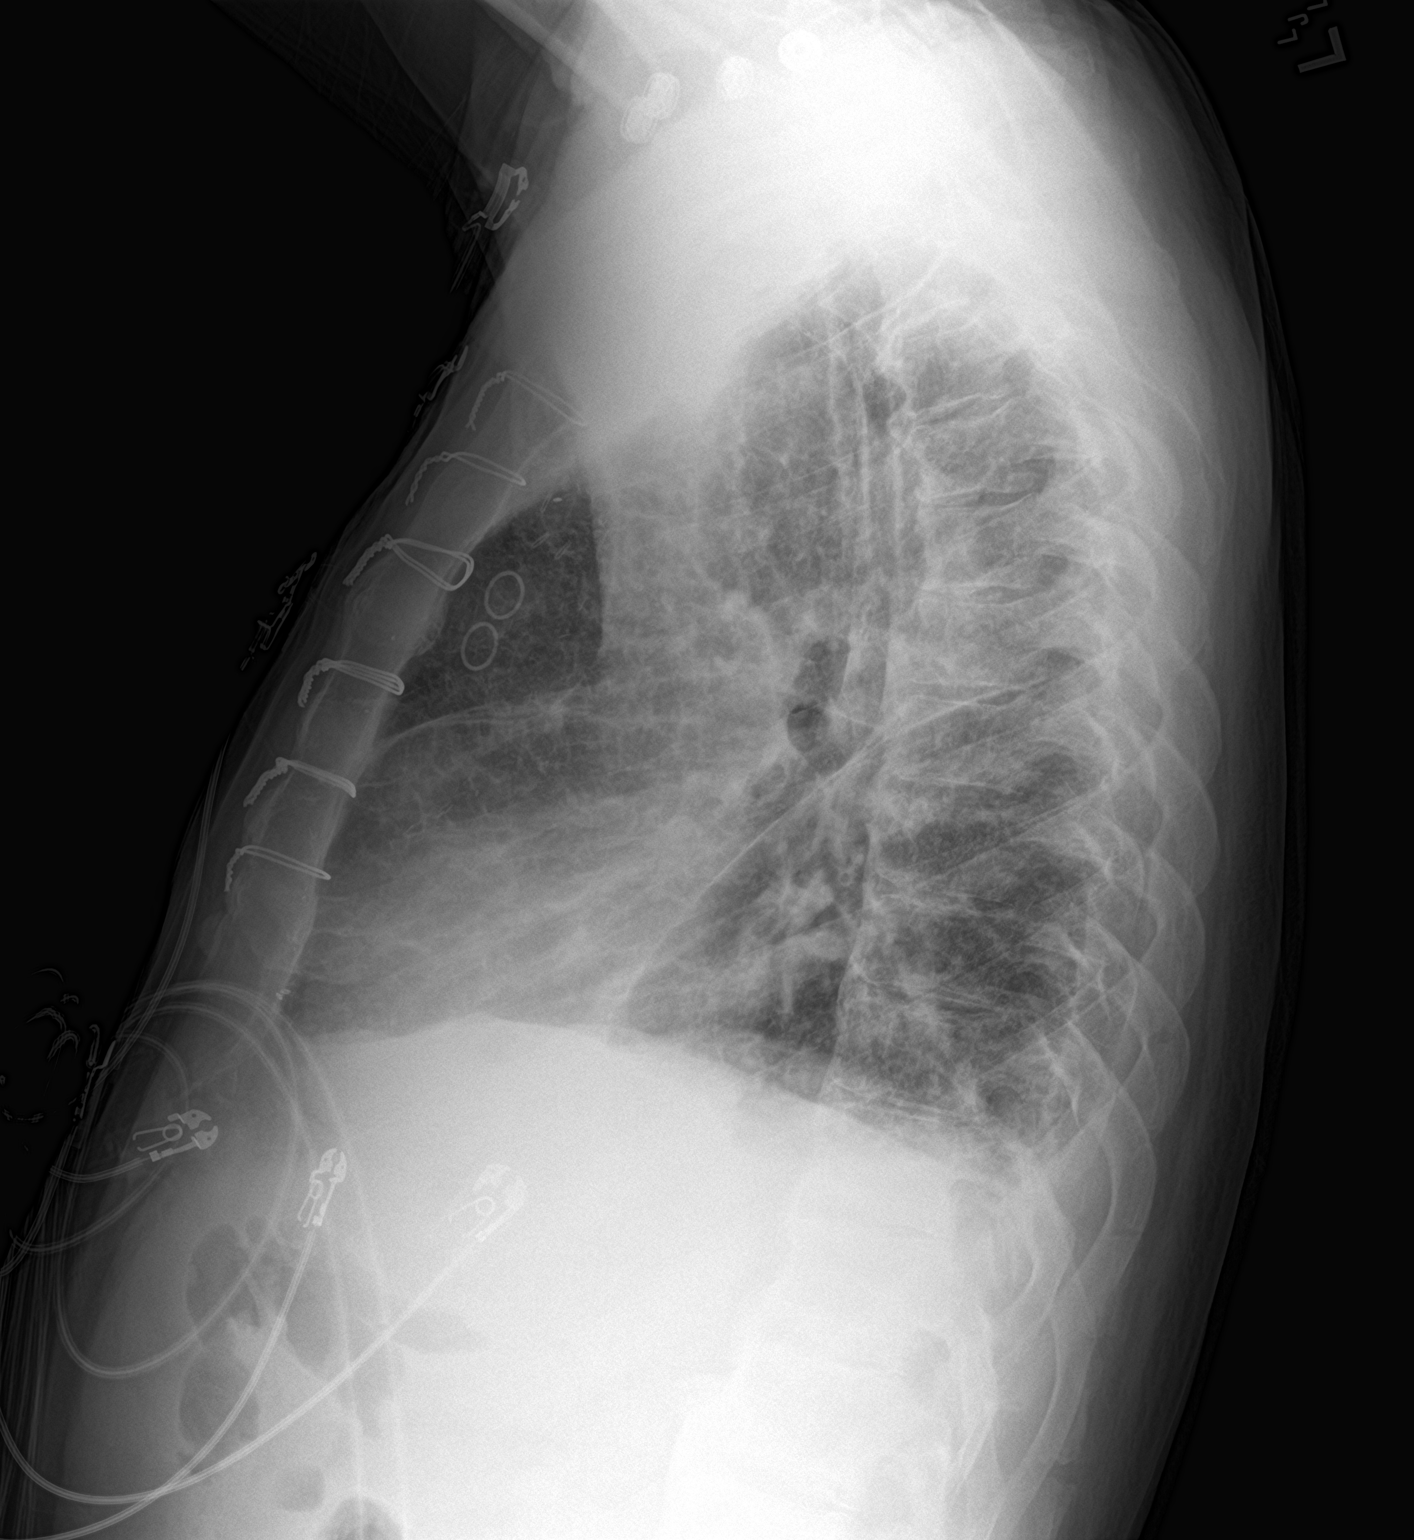

[2 of 2 positions shown; findings below may reference images not displayed]

FINDINGS: UPPER limits normal heart size noted.

Interstitial opacities are present suggestive of mild interstitial
edema.

Trace bilateral pleural effusions are noted.

There is no evidence of pneumothorax or acute bony abnormality.
IMPRESSION: Interstitial opacities suggestive of mild interstitial edema. Trace
bilateral pleural effusions.

## 2023-08-05 ENCOUNTER — Encounter: Payer: Self-pay | Admitting: Cardiovascular Disease

## 2023-08-05 ENCOUNTER — Ambulatory Visit: Payer: Medicare HMO | Attending: Cardiovascular Disease | Admitting: Cardiovascular Disease

## 2023-08-05 DIAGNOSIS — Z951 Presence of aortocoronary bypass graft: Secondary | ICD-10-CM

## 2023-08-05 DIAGNOSIS — I255 Ischemic cardiomyopathy: Secondary | ICD-10-CM

## 2023-08-05 DIAGNOSIS — D6869 Other thrombophilia: Secondary | ICD-10-CM

## 2023-08-05 DIAGNOSIS — E875 Hyperkalemia: Secondary | ICD-10-CM

## 2023-08-05 DIAGNOSIS — I2102 ST elevation (STEMI) myocardial infarction involving left anterior descending coronary artery: Secondary | ICD-10-CM | POA: Diagnosis not present

## 2023-08-05 DIAGNOSIS — I480 Paroxysmal atrial fibrillation: Secondary | ICD-10-CM | POA: Diagnosis not present

## 2023-08-05 DIAGNOSIS — E785 Hyperlipidemia, unspecified: Secondary | ICD-10-CM

## 2023-08-05 DIAGNOSIS — E7841 Elevated Lipoprotein(a): Secondary | ICD-10-CM

## 2023-08-05 DIAGNOSIS — Z8679 Personal history of other diseases of the circulatory system: Secondary | ICD-10-CM | POA: Diagnosis not present

## 2023-08-05 NOTE — Progress Notes (Unsigned)
 Cardiology Office Note    Date:  08/05/2023   ID:  Mattix, Imhof 07/18/1945, MRN [id number]  PCP:  [name], MD  Cardiologist:  [name], MD   8 month F/u office visit with me   History of Present Illness:  Timothy Munoz is a 78 y.o. male who suffered an anterolateral ST segment elevation myocardial infarction in September 2022 and underwent initial balloon angioplasty of his ostial LAD.  His course was complicated by cardiogenic shock with reduced LV function with EF at 35%.  He required intra-aortic balloon counterpulsation for hemodynamic support and ultimately underwent CABG revascularization surgery x3 in January 20, 2021 by Dr. Dorris Fetch.  He developed postoperative atrial fibrillation requiring amiodarone and was also treated with digoxin.  An echo Doppler study on January 25, 2021 showed slight improvement in LV function with EF at 40 to 45% with grade 2 diastolic dysfunction.  He was on furosemide for volume overload.  He was subsequently seen in the office setting by Edd Fabian, NP on May 26, 2020 and his Lasix was changed to as needed.  Repeat echo Doppler study in June 06, 2021 showed EF 35 to 40% with moderate PA systolic pressure elevation at 53.4 mm, biatrial enlargement, and moderate to severe MR.  He was started on Farxiga with subsequent dose reduction due to creatinine elevation.  He was seen in April 2023 by Azalee Course, PA and was transition from ARB therapy to Entresto 24/26 mg twice a day.  He was hospitalized on May 7 with discharge Sep 21, 2021 increasing dyspnea and 5 pound weight gain in addition to significant lower extremity edema.  He was treated with IV furosemide and an echo showed further reduction of EF at 26% on Sep 18, 2021.  He was in atrial flutter and on May 10 underwent TEE guided cardioversion with restoration of sinus rhythm and plan for  subsequent EP evaluation was recommended with Dr. [name].  I saw him on Sep 27, 2021 at  which time he felt well and denied chest pain or shortness of breath.  He was on carvedilol 6.25 mg twice a day, Entresto 24/26 mg twice a day, as needed furosemide, Farxiga 5 mg, and is on Plaquenil 75 mg and Eliquis 5 mg twice a day.  During that evaluation, his blood pressure remained low precluding further dose titration of his guideline directed medical therapy.  Subsequent laboratory was recommended.  He saw Dr. [name] on Oct 06, 2021 and given a single episode of atrial flutter he was recommended to continue monitoring.  Since I last saw him, he has been evaluated by Azalee Course, PA-C on several occasions with his last being in August 2023.  He had worn a monitor which showed 2 episodes of nonsustained VT the longest only 7 beats with 8 brief episodes of SVT.    Presently, I last saw him on March 12, 2022.  On February 20, 2022 he underwent bilateral eyelid surgery at [facility].  Currently he has been on Eliquis 5 mg twice a day, Plavix 75 mg daily and continues to be on Entresto 24/26 mg twice a day, Farxiga 5 mg daily, spironolactone 12.5 mg every third day.  He he is on rosuvastatin 20 mg daily.  He denies chest pain or shortness of breath.  He denies any dizziness or palpitations.  During that evaluation I had recommended that he undergo a follow-up echo Doppler study in February 2024 for reassessment of LV function and  his mitral regurgitation.  On June 19, 2022 a 2D echo Doppler study demonstrated EF at 30 to 35% with severe akinesis of the mid apical anterior wall, anteroseptal wall and apical segment.  He had mild LVH with grade 2 diastolic dysfunction.  There was moderate biatrial enlargement.  He had mild to moderate mitral regurgitation with moderate mitral annular calcification.  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  I last saw him in August 14, 2022.  He denied any chest pain or shortness of breath.  He has been experiencing intermittent rare nosebleeds.  He  continues to be on Eliquis for anticoagulation and reduced aspirin to 81 mg.  He is on carvedilol 6.25 mg twice a day, Entresto 24/26 mg twice a day, Farxiga 10 mg daily in addition to spironolactone 12.5 mg every third day.  He continues to be on rosuvastatin 20 mg daily.  He has not had recent laboratory.  During that evaluation I recommended he reduce his aspirin to 81 mg every other day.  He presented to the emergency room on October 18, 2022 with increasing shortness of breath and was found to be in atrial fibrillation/flutter.  In the ER he went successful DC cardioversion with 200 J converting him to sinus rhythm.  Presently, he feels well but has had issues with recurrent urinary tract infections.  As result, he is now off [place].  He has been walking 3 miles a day.  He denies chest pain or shortness of breath.  He continues to be on carvedilol 6.25 mg twice a day, Eliquis 5 mg twice a day, aspirin 81 mg every other day, Zetia 10 mg, rosuvastatin 40 mg, Entresto 24/26 mg twice a day in addition to Flomax.  He presents for evaluation.   Past Medical History:  Diagnosis Date   Arthritis    Blood dyscrasia    CHF (congestive heart failure) (HCC)    Coronary artery disease    Dysrhythmia    post-CABG PAF; aflutter, s/p DCCV 09/20/21   HFrEF (heart failure with reduced ejection fraction) (HCC)    Hyperlipidemia    mild    Hypertension    Knee pain    Myocardial infarction (HCC)    Obesity    Tremor    Tremor, essential 03/04/2019    Past Surgical History:  Procedure Laterality Date   APPENDECTOMY     BACK SURGERY     BROW LIFT Bilateral 09/14/2022   Procedure: BLEPHAROPLASTY;  Surgeon: Dairl Ponder, MD;  Location: [facility] OR;  Service: Ophthalmology;  Laterality: Bilateral;   CARDIAC CATHETERIZATION     CARDIOVERSION N/A 09/20/2021   Procedure: CARDIOVERSION;  Surgeon: Jake Bathe, MD;  Location: [facility] ENDOSCOPY;  Service: Cardiovascular;  Laterality: N/A;   CORONARY ARTERY BYPASS  GRAFT N/A 01/20/2021   Procedure: CORONARY ARTERY BYPASS GRAFTING (CABG) X 3 ON PUMP, USING LEFT INTERNAL MAMMARY ARTERY AND LEFT ENDOSCOPIC GREATER SAPHENOUS VEIN HARVEST CONDUITS;  Surgeon: [name], MD;  Location: [facility] OR;  Service: Open Heart Surgery;  Laterality: N/A;   CORONARY/GRAFT ACUTE MI REVASCULARIZATION N/A 01/16/2021   Procedure: Coronary/Graft Acute MI Revascularization;  Surgeon: Lennette Bihari, MD;  Location: [facility] INVASIVE CV LAB;  Service: Cardiovascular;  Laterality: N/A;   ECTROPION REPAIR Bilateral 02/23/2022   Procedure: REPAIR OF ECTROPION;  Surgeon: Dairl Ponder, MD;  Location: [facility] OR;  Service: Ophthalmology;  Laterality: Bilateral;   ENDOVEIN HARVEST OF GREATER SAPHENOUS VEIN Left 01/20/2021   Procedure: ENDOVEIN HARVEST OF  GREATER SAPHENOUS VEIN;  Surgeon: [name], MD;  Location: [facility] OR;  Service: Open Heart Surgery;  Laterality: Left;   EYE SURGERY     HEMORRHOID SURGERY     IABP INSERTION N/A 01/16/2021   Procedure: IABP Insertion;  Surgeon: Lennette Bihari, MD;  Location: [facility] INVASIVE CV LAB;  Service: Cardiovascular;  Laterality: N/A;   LEFT HEART CATH AND CORONARY ANGIOGRAPHY N/A 01/16/2021   Procedure: LEFT HEART CATH AND CORONARY ANGIOGRAPHY;  Surgeon: Lennette Bihari, MD;  Location: [facility] INVASIVE CV LAB;  Service: Cardiovascular;  Laterality: N/A;   TEE WITHOUT CARDIOVERSION N/A 01/20/2021   Procedure: TRANSESOPHAGEAL ECHOCARDIOGRAM (TEE);  Surgeon: [name], MD;  Location: [facility] OR;  Service: Open Heart Surgery;  Laterality: N/A;   TEE WITHOUT CARDIOVERSION N/A 09/20/2021   Procedure: TRANSESOPHAGEAL ECHOCARDIOGRAM (TEE);  Surgeon: Jake Bathe, MD;  Location: [facility] ENDOSCOPY;  Service: Cardiovascular;  Laterality: N/A;    Current Medications: Outpatient Medications Prior to Visit  Medication Sig Dispense Refill   carvedilol (COREG) 6.25 MG tablet TAKE ONE TABLET BY MOUTH TWICE DAILY WITH A MEAL 180 tablet 3   ELIQUIS 5 MG  TABS tablet TAKE ONE TABLET BY MOUTH TWICE DAILY 180 tablet 1   ezetimibe (ZETIA) 10 MG tablet Take 1 tablet (10 mg total) by mouth daily. 90 tablet 3   rosuvastatin (CRESTOR) 40 MG tablet Take 1 tablet (40 mg total) by mouth daily. 90 tablet 3   sacubitril-valsartan (ENTRESTO) 49-51 MG Take 1 tablet by mouth 2 (two) times daily. 180 tablet 3   tamsulosin (FLOMAX) 0.4 MG CAPS capsule Take 0.4 mg by mouth daily.     aspirin EC 81 MG tablet Take 1 tablet (81 mg total) by mouth every other day. Swallow whole. (Patient not taking: Reported on 08/05/2023)     cephALEXin (KEFLEX) 500 MG capsule Take 500 mg by mouth 2 (two) times daily. (Patient not taking: Reported on 08/05/2023)     No facility-administered medications prior to visit.     Allergies:   Patient has no known allergies.   Social History   Socioeconomic History   Marital status: Single    Spouse name: Not on file   Number of children: 2   Years of education: 13   Highest education level: High school graduate  Occupational History   Occupation: retired    Comment: Pharmacist, hospital  Tobacco Use   Smoking status: Former   Smokeless tobacco: Former    Types: Chew    Quit date: 01/16/2021  Vaping Use   Vaping status: Never Used  Substance and Sexual Activity   Alcohol use: Not Currently    Comment: one beer every two months   Drug use: No   Sexual activity: Not on file  Other Topics Concern   Not on file  Social History Narrative   Right handed    Social Drivers of Health   Financial Resource Strain: Not on file  Food Insecurity: Not on file  Transportation Needs: Not on file  Physical Activity: Not on file  Stress: Not on file  Social Connections: Not on file    He is retired and was in the plumbing and Pharmacist, hospital business.  He worked for over 40 years.  Prior tobacco history, quit smoking over 30 years ago  Family History:  The patient's family history includes Alzheimer's disease in his mother;  Hypertension in his father and mother; Parkinson's disease in his mother; Suicidality in his father.  ROS General: Negative; No fevers, chills, or night sweats;  HEENT: Negative; No changes in vision or hearing, sinus congestion, difficulty swallowing Pulmonary: Negative; No cough, wheezing, shortness of breath, hemoptysis Cardiovascular: See HPI GI: Negative; No nausea, vomiting, diarrhea, or abdominal pain GU: Frequent UTIs, now off [place] Musculoskeletal: Negative; no myalgias, joint pain, or weakness Hematologic/Oncology: Negative; no easy bruising, bleeding Endocrine: Negative; no heat/cold intolerance; no diabetes Neuro: Negative; no changes in balance, headaches Skin: Negative; No rashes or skin lesions Psychiatric: Negative; No behavioral problems, depression Sleep: Negative; No snoring, daytime sleepiness, hypersomnolence, bruxism, restless legs, hypnogognic hallucinations, no cataplexy Other comprehensive 14 point system review is negative.   PHYSICAL EXAM:   VS:  BP 116/66 (BP Location: Left Arm, Patient Position: Sitting, Cuff Size: Large)   Pulse 78   Ht 6\' 2"  (1.88 m)   Wt 242 lb (109.8 kg)   SpO2 95%   BMI 31.07 kg/m     Repeat blood pressure by me was 122/72  Wt Readings from Last 3 Encounters:  08/05/23 242 lb (109.8 kg)  04/02/23 245 lb 6.4 oz (111.3 kg)  10/23/22 240 lb 9.6 oz (109.1 kg)   General: Alert, oriented, no distress.  Skin: normal turgor, no rashes, warm and dry HEENT: Normocephalic, atraumatic. Pupils equal round and reactive to light; sclera anicteric; extraocular muscles intact;  Nose without nasal septal hypertrophy Mouth/Parynx benign; Mallinpatti scale 3 Neck: No JVD, no carotid bruits; normal carotid upstroke Lungs: clear to ausculatation and percussion; no wheezing or rales Chest wall: without tenderness to palpitation Heart: PMI not displaced, RRR, s1 s2 normal, 1/6 systolic murmur, no diastolic murmur, no rubs, gallops, thrills, or  heaves Abdomen: soft, nontender; no hepatosplenomehaly, BS+; abdominal aorta nontender and not dilated by palpation. Back: no CVA tenderness Pulses 2+ Musculoskeletal: full range of motion, normal strength, no joint deformities Extremities: no clubbing cyanosis or edema, Homan's sign negative  Neurologic: grossly nonfocal; Cranial nerves grossly wnl Psychologic: Normal mood and affect   Studies/Labs Reviewed:   EKG Interpretation Date/Time:  Monday August 05 2023 08:22:50 EDT Ventricular Rate:  66 PR Interval:  202 QRS Duration:  112 QT Interval:  422 QTC Calculation: 442 R Axis:   -61  Text Interpretation: Normal sinus rhythm Left anterior fascicular block Minimal voltage criteria for LVH, may be normal variant ( Cornell product ) Septal infarct (cited on or before 02-Apr-2023) T wave abnormality, consider lateral ischemia When compared with ECG of 02-Apr-2023 11:56, Left anterior fascicular block is now Present Criteria for Lateral infarct are no longer Present ST elevation now present in Anterior leads T wave inversion now evident in Lateral leads Confirmed by [name] ([id number]) on 08/05/2023 8:39:32 AM    April 02, 2023 ECG (independently read by me): Sinus bradycardia at 56 with first-degree block PR interval 222.  QS V1 V2 lead I and aVL.  August 09, 2022 ECG (independently read by me): Sinus bradycardia at 56, Q V1-2, I aVL    March 12, 2022 ECG (independently read by me): NSR at 66, QS V1-2; I,aVL; PRWP   Sep 27, 2021 ECG (independently read by me): Sinus rhythm at 70 bpm with first-degree AV block, PR interval 260 ms.  QS complex V1 through V5 consistent with prior anterior MI.  QTc increased at 473 ms.  Recent Labs:    Latest Ref Rng & Units 10/18/2022    2:16 PM 09/27/2022    8:33 AM 09/14/2022    9:20 AM  BMP  Glucose 70 -  99 mg/dL 83  87  92   BUN 8 - 23 mg/dL 22  20  29    Creatinine 0.61 - 1.24 mg/dL 9.14  7.82  9.56   BUN/Creat Ratio 10 - 24  17    Sodium 135  - 145 mmol/L 140  140  142   Potassium 3.5 - 5.1 mmol/L 3.7  5.1  4.7   Chloride 98 - 111 mmol/L 115  106  114   CO2 22 - 32 mmol/L 20  22  20    Calcium 8.9 - 10.3 mg/dL 7.5  9.3  8.9         Latest Ref Rng & Units 09/06/2022    8:16 AM 08/15/2022    8:08 AM 09/27/2021   10:50 AM  Hepatic Function  Total Protein 6.0 - 8.5 g/dL 6.8  6.6  6.7   Albumin 3.8 - 4.8 g/dL 4.4  4.1  3.9   AST 0 - 40 IU/L 20  20  26    ALT 0 - 44 IU/L 16  14  21    Alk Phosphatase 44 - 121 IU/L 167  163  153   Total Bilirubin 0.0 - 1.2 mg/dL 0.3  0.2  0.4        Latest Ref Rng & Units 10/18/2022   11:22 AM 08/15/2022    8:08 AM 03/21/2022    4:02 PM  CBC  WBC 4.0 - 10.5 K/uL 7.1  6.8  7.1   Hemoglobin 13.0 - 17.0 g/dL 21.3  08.6  57.8   Hematocrit 39.0 - 52.0 % 41.0  41.3  36.2   Platelets 150 - 400 K/uL 282  253  205    Lab Results  Component Value Date   MCV 86.0 10/18/2022   MCV 85 08/15/2022   MCV 82 03/21/2022   Lab Results  Component Value Date   TSH 2.030 08/15/2022   Lab Results  Component Value Date   HGBA1C 6.0 (H) 08/15/2022     BNP    Component Value Date/Time   BNP 510.7 (H) 10/18/2022 1122    ProBNP    Component Value Date/Time   PROBNP 3,278 (H) 09/27/2021 1050     Lipid Panel     Component Value Date/Time   CHOL 145 09/06/2022 0816   TRIG 96 09/06/2022 0816   HDL 35 (L) 09/06/2022 0816   CHOLHDL 4.1 09/06/2022 0816   CHOLHDL 3.9 01/16/2021 0739   VLDL 7 01/16/2021 0739   LDLCALC 92 09/06/2022 0816   LABVLDL 18 09/06/2022 0816     RADIOLOGY: No results found.   Additional studies/ records that were reviewed today include:   I have reviewed his extensive records since his anterolateral STEMI on January 20, 2021 with subsequent echoes and evaluations.    Mid LM to Dist LM lesion is 20% stenosed.   Ost LAD to Prox LAD lesion is 100% stenosed.   Ost Cx lesion is 90% stenosed.   Ost RCA lesion is 20% stenosed.   Post intervention, there is a 60% residual  stenosis.   There is moderate left ventricular systolic dysfunction.   LV end diastolic pressure is moderately elevated.   The left ventricular ejection fraction is 35-45% by visual estimate.   Acute anterolateral myocardial infarction secondary to total near ostial occlusion of a large LAD which wraps around the LV apex.   Large normal ramus intermediate vessel.   Left circumflex vessel with 85 to 90% ostial stenosis.   Large RCA with 20%  irregularity proximally.   PTCA of the LAD ostium with hypotension and idioventricular rhythm with reperfusion requiring initiation of Levophed reestablishment of TIMI-3 flow to a large LAD vessel which wraps around the LV apex.   Insertion of an intra-aortic balloon pump for optimal hemodynamic support with augmented diastolic pressure at 150 millimeters mercury.   RECOMMENDATION: In light of the patient's complex anatomy, stenting was not performed due to high likelihood of jailing a very large ramus intermediate vessel and with concomitant ostial circumflex stenosis it was felt that surgical revascularization once patient stabilizes from his myocardial infarction would be optimal therapy.  He did receive 1 dose of oral Brilinta in the laboratory which will not be continued.  We will plan 2D echo Doppler today.  Initiate aggressive lipid-lowering therapy.  Dr. Cliffton Asters was contacted who will see the patient and depending upon the patient;s hemodynamic stability perform CABG revascularization surgery this week following washout of his initial Brilinta dose.   Intervention  I   TEE: 09/20/2021  1. Left ventricular ejection fraction, by estimation, is 20 to 25%. The  left ventricle has severely decreased function. The left ventricle has no  regional wall motion abnormalities.   2. Right ventricular systolic function is normal. The right ventricular  size is normal.   3. No left atrial/left atrial appendage thrombus was detected.   4. PISA radius 0.4  cm      Annular diameter 3.07 cm      Mal Coaptation 0.26 cm      MR ERO 0.13 cm2      MR Vol 12 ml      Normal pulmonary vein flow. The mitral valve is degenerative. Moderate  mitral valve regurgitation. No evidence of mitral stenosis.   5. The aortic valve is normal in structure. Aortic valve regurgitation is  not visualized. No aortic stenosis is present.   6. There is mild (Grade II) plaque.   7. The inferior vena cava is normal in size with greater than 50%  respiratory variability, suggesting right atrial pressure of 3 mmHg.    ASSESSMENT:    1. PAF (paroxysmal atrial fibrillation) [facility]      PLAN:  Mr. Adib Wahba is a 78 year old retired gentleman who suffered an anterolateral myocardial infarction on January 14, 2021 leading to cardiogenic shock.  He underwent balloon angioplasty of an ostial total LAD occlusion, intra-aortic balloon counterpulsation with plans for CABG revascularization surgery following Brilinta washout and stability.  He has been found to have a subsequent ischemic cardiomyopathy and has had issues with volume overload.  He has been transitioned to [facility] from ARB therapy and is also currently on carvedilol 6.25 mg twice a day, reduced dose Farxiga 5 mg daily, and continues to be on clopidogrel in addition to Eliquis anticoagulation.  He has LV dysfunction and his recent echo Doppler study during his readmission for acute on chronic CHF showed EF of 26%.  He was in atrial flutter and ultimately underwent successful TEE guided cardioversion on Sep 20, 2021.  Subsequently, he has been maintaining sinus rhythm.  A subsequent echo Doppler study was done on January 03, 2022 which continued to show EF of 35 to 40% with global dysfunction.  The ventricular internal cavity was moderately dilated.  It was noted that he had moderate to severe mitral regurgitation with restricted posterior leaflet.  At his October 2023 evaluation, his blood pressure was stable but remain  low on his guideline directed medical therapy.  Presently, blood pressure is stable  but remains low on his guideline directed medical therapy of carvedilol 6.25 mg twice a day, Farxiga at reduced dose 5 mg, Entresto 24/26 mg twice a day and spironolactone 12.5 mg every third day.  He is now 14 months following his STEMI.  He had bilateral eye surgery and tolerated that well.  He had held his Plavix.  During his October 2023 I recommended he discontinue Plavix and can institute aspirin 81 mg to take in addition to Eliquis particularly with his CAD.  He underwent a follow-up echo Doppler study for reassessment of LV function and MR on June 19, 2022.  This continues to show his ischemic cardiomyopathy with a EF stable at 30 to 35%.  There was severe akinesis of the mid apical anterior wall, anteroseptal and apical segment.  There was mild LVH and grade 2 diastolic dysfunction.  Pulmonary artery systolic pressure was mildly elevated at 36 mm.  There was moderate biatrial enlargement.  His mitral regurgitation was now felt to be mild to moderate.  Since his prior evaluation, he underwent DC cardioversion after presenting to the ER in atrial flutter/atrial fibrillation.  Due to recurrent urinary tract infections, he is no longer on Farxiga.  Presently he is on carvedilol 6.25 mg twice a day, Entresto 24/26 mg twice a day, and is no longer on spironolactone due to elevated potassium level leading to its discontinuance.  He is on Eliquis for anticoagulation 5 mg twice a day.  He is on Zetia and rosuvastatin for hyperlipidemia.  With his coronary artery disease, target LDL is less than 55.  Presently I am recommending slight titration of Entresto to 49/51 mg twice a day since he is no longer on Farxiga.  His ECG today shows sinus bradycardia with first-degree AV block at a ventricular rate of 56 and PR interval 222 ms.  He will be undergoing repeat laboratory with his primary physician.  I checked LP(a) in April 2024  which was elevated at 192.  If he continues to have LDL elevation, I would recommend PCSK9 initiation for aggressive lipid lowering.  3 months he will undergo repeat echo Doppler study.  I would recommend repeat fasting laboratory with competence of metabolic panel, lipid panel.  I will see him in 4 months for reevaluation or sooner as needed.    Medication Adjustments/Labs and Tests Ordered: Current medicines are reviewed at length with the patient today.  Concerns regarding medicines are outlined above.  Medication changes, Labs and Tests ordered today are listed in the Patient Instructions below. There are no Patient Instructions on file for this visit.   Signed, [name], MD  08/05/2023 8:35 AM    [facility] [address], [address], [place], [place]  [address] Phone: [phone]

## 2023-08-05 NOTE — Patient Instructions (Signed)
 Medication Instructions:  No medication changes were made during today's visit.  *If you need a refill on your cardiac medications before your next appointment, please call your pharmacy*   Lab Work: Return for fasting labs  If you have labs (blood work) drawn today and your tests are completely normal, you will receive your results only by: MyChart Message (if you have MyChart) OR A paper copy in the mail If you have any lab test that is abnormal or we need to change your treatment, we will call you to review the results.   Testing/Procedures: No procedures were ordered during today's visit.    Follow-Up: At [facility], you and your health needs are our priority.  As part of our continuing mission to provide you with exceptional heart care, we have created designated Provider Care Teams.  These Care Teams include your primary Cardiologist (physician) and Advanced Practice Providers (APPs -  Physician Assistants and Nurse Practitioners) who all work together to provide you with the care you need, when you need it.  We recommend signing up for the patient portal called "MyChart".  Sign up information is provided on this After Visit Summary.  MyChart is used to connect with patients for Virtual Visits (Telemedicine).  Patients are able to view lab/test results, encounter notes, upcoming appointments, etc.  Non-urgent messages can be sent to your provider as well.   To learn more about what you can do with MyChart, go to [email or web address].    Your next appointment:   5 month(s)  Provider:   Dr. Tonny Bollman     Other Instructions [facility]  [address] [place], [place] [address] OPENING APRIL [phone]       1st Floor: - Lobby - Registration  - Pharmacy  - Lab - Cafe   2nd Floor: - PV Lab - Diagnostic Testing (echo, CT, nuclear med)   3rd Floor: - Vacant   4th Floor: - TCTS (cardiothoracic surgery) - AFib Clinic -  Structural Heart Clinic - Vascular Surgery  - Vascular Ultrasound   5th Floor: - [facility] Cardiology (general and EP) - Clinical Pharmacy for coumadin, hypertension, lipid, weight-loss medications, and med management appointments      Valet parking services will be available as well.

## 2023-08-06 ENCOUNTER — Telehealth: Payer: Self-pay | Admitting: Cardiothoracic Surgery

## 2023-08-06 DIAGNOSIS — E785 Hyperlipidemia, unspecified: Secondary | ICD-10-CM | POA: Diagnosis not present

## 2023-08-06 DIAGNOSIS — I480 Paroxysmal atrial fibrillation: Secondary | ICD-10-CM | POA: Diagnosis not present

## 2023-08-06 DIAGNOSIS — E7841 Elevated Lipoprotein(a): Secondary | ICD-10-CM | POA: Diagnosis not present

## 2023-08-06 LAB — CBC

## 2023-08-06 NOTE — Telephone Encounter (Signed)
 [facility] called with K 5.8, unclear if hemolyzed.  Recently admitted, last K wnl but prior K in the low 5 range Unable to reach patient tonight.

## 2023-08-07 ENCOUNTER — Telehealth: Payer: Self-pay

## 2023-08-07 LAB — COMPREHENSIVE METABOLIC PANEL
ALT: 31 IU/L (ref 0–44)
AST: 26 IU/L (ref 0–40)
Albumin: 4.4 g/dL (ref 3.8–4.8)
Alkaline Phosphatase: 148 IU/L — ABNORMAL HIGH (ref 44–121)
BUN/Creatinine Ratio: 18 (ref 10–24)
BUN: 24 mg/dL (ref 8–27)
Bilirubin Total: 0.5 mg/dL (ref 0.0–1.2)
CO2: 22 mmol/L (ref 20–29)
Calcium: 9.6 mg/dL (ref 8.6–10.2)
Chloride: 105 mmol/L (ref 96–106)
Creatinine, Ser: 1.31 mg/dL — ABNORMAL HIGH (ref 0.76–1.27)
Globulin, Total: 2.7 g/dL (ref 1.5–4.5)
Glucose: 94 mg/dL (ref 70–99)
Potassium: 5.8 mmol/L (ref 3.5–5.2)
Sodium: 140 mmol/L (ref 134–144)
Total Protein: 7.1 g/dL (ref 6.0–8.5)
eGFR: 56 mL/min/{1.73_m2} — ABNORMAL LOW (ref >59)

## 2023-08-07 LAB — CBC
Hematocrit: 47.3 % (ref 37.5–51.0)
Hemoglobin: 15.6 g/dL (ref 13.0–17.7)
MCH: 29.3 pg (ref 26.6–33.0)
MCHC: 33 g/dL (ref 31.5–35.7)
MCV: 89 fL (ref 79–97)
Platelets: 230 10*3/uL (ref 150–450)
RBC: 5.33 x10E6/uL (ref 4.14–5.80)
RDW: 13.8 % (ref 11.6–15.4)
WBC: 8.3 10*3/uL (ref 3.4–10.8)

## 2023-08-07 LAB — LIPID PANEL
Chol/HDL Ratio: 3.3 ratio (ref 0.0–5.0)
Cholesterol, Total: 165 mg/dL (ref 100–199)
HDL: 50 mg/dL (ref >39)
LDL Chol Calc (NIH): 96 mg/dL (ref 0–99)
Triglycerides: 103 mg/dL (ref 0–149)
VLDL Cholesterol Cal: 19 mg/dL (ref 5–40)

## 2023-08-07 LAB — TSH: TSH: 2.49 u[IU]/mL (ref 0.450–4.500)

## 2023-08-07 LAB — BRAIN NATRIURETIC PEPTIDE: BNP: 131.5 pg/mL — ABNORMAL HIGH (ref 0.0–100.0)

## 2023-08-07 NOTE — Telephone Encounter (Signed)
 Acknowledged.

## 2023-08-07 NOTE — Telephone Encounter (Signed)
 Patient identification verified by 2 forms. Shade Flood, RN    [facility] called to report critical lab result of POTASSIUM 5.8 from patient's CMET on 08/06/23.    Called and spoke to patient  Patient states:  - " I feel pretty good today"   Patient denies:  - chest pain, irregular HR, dizziness, muscle weakness, SOB at time of call.              Interventions/Plan: - Lab results reviewed with primary cardiologist [name]. Per Dr. [name] patient to [facility] for 3 days and then return to our office for repeat BMET on Monday 08/12/23 - patient notified of recommendations by primary covering Moises Blood, CMA   Patient agrees with plan, no questions at this time

## 2023-08-09 ENCOUNTER — Encounter: Payer: Self-pay | Admitting: Cardiovascular Disease

## 2023-08-12 DIAGNOSIS — E875 Hyperkalemia: Secondary | ICD-10-CM | POA: Diagnosis not present

## 2023-08-12 LAB — COMPREHENSIVE METABOLIC PANEL WITH GFR
ALT: 14 IU/L (ref 0–44)
AST: 19 IU/L (ref 0–40)
Albumin: 4 g/dL (ref 3.8–4.8)
Alkaline Phosphatase: 125 IU/L — ABNORMAL HIGH (ref 44–121)
BUN/Creatinine Ratio: 21 (ref 10–24)
BUN: 28 mg/dL — ABNORMAL HIGH (ref 8–27)
Bilirubin Total: 0.5 mg/dL (ref 0.0–1.2)
CO2: 20 mmol/L (ref 20–29)
Calcium: 8.8 mg/dL (ref 8.6–10.2)
Chloride: 106 mmol/L (ref 96–106)
Creatinine, Ser: 1.31 mg/dL — ABNORMAL HIGH (ref 0.76–1.27)
Globulin, Total: 2.2 g/dL (ref 1.5–4.5)
Glucose: 81 mg/dL (ref 70–99)
Potassium: 4.8 mmol/L (ref 3.5–5.2)
Sodium: 139 mmol/L (ref 134–144)
Total Protein: 6.2 g/dL (ref 6.0–8.5)
eGFR: 56 mL/min/{1.73_m2} — ABNORMAL LOW (ref >59)

## 2023-08-12 NOTE — Addendum Note (Signed)
 Addended by: Shon Hale on: 08/12/2023 04:24 PM   Modules accepted: Orders

## 2023-08-13 ENCOUNTER — Telehealth: Payer: Self-pay | Admitting: Cardiovascular Disease

## 2023-08-13 NOTE — Telephone Encounter (Signed)
 Patient identification verified by 2 forms. Marilynn Rail, RN    Called and spoke to patient  Patient states:   -would like to know recent potassium labs   -would like to know when Repatha will be started  Informed patient:   -K at 4.8, plan is for repeat BMET   -message sent to pharmacy for assistance with Repatha  Patient verbalized understanding, no questions at this time

## 2023-08-13 NOTE — Telephone Encounter (Signed)
 Call pt to schedule appointment with lipid clinic. Scheduled for May 29 at 8:15.

## 2023-08-13 NOTE — Telephone Encounter (Signed)
 Patient would like to review Potassium results again. Specifically, he would like more information about starting on injectable.

## 2023-08-13 NOTE — Telephone Encounter (Signed)
 Noted  No further nursing outreach needed at this time

## 2023-08-16 DIAGNOSIS — M1711 Unilateral primary osteoarthritis, right knee: Secondary | ICD-10-CM | POA: Diagnosis not present

## 2023-08-19 ENCOUNTER — Other Ambulatory Visit: Payer: Self-pay

## 2023-08-19 DIAGNOSIS — E875 Hyperkalemia: Secondary | ICD-10-CM | POA: Diagnosis not present

## 2023-08-19 LAB — BASIC METABOLIC PANEL WITH GFR
BUN/Creatinine Ratio: 20 (ref 10–24)
BUN: 25 mg/dL (ref 8–27)
CO2: 22 mmol/L (ref 20–29)
Calcium: 9.4 mg/dL (ref 8.6–10.2)
Chloride: 105 mmol/L (ref 96–106)
Creatinine, Ser: 1.27 mg/dL (ref 0.76–1.27)
Glucose: 83 mg/dL (ref 70–99)
Potassium: 5.3 mmol/L — ABNORMAL HIGH (ref 3.5–5.2)
Sodium: 140 mmol/L (ref 134–144)
eGFR: 59 mL/min/{1.73_m2} — ABNORMAL LOW (ref >59)

## 2023-08-23 DIAGNOSIS — M1711 Unilateral primary osteoarthritis, right knee: Secondary | ICD-10-CM | POA: Diagnosis not present

## 2023-08-29 DIAGNOSIS — R0981 Nasal congestion: Secondary | ICD-10-CM | POA: Diagnosis not present

## 2023-08-29 DIAGNOSIS — J019 Acute sinusitis, unspecified: Secondary | ICD-10-CM | POA: Diagnosis not present

## 2023-08-30 DIAGNOSIS — M1711 Unilateral primary osteoarthritis, right knee: Secondary | ICD-10-CM | POA: Diagnosis not present

## 2023-09-10 ENCOUNTER — Telehealth (HOSPITAL_BASED_OUTPATIENT_CLINIC_OR_DEPARTMENT_OTHER): Payer: Self-pay | Admitting: Pharmacist Clinician (PhC)/ Clinical Pharmacy Specialist

## 2023-09-10 ENCOUNTER — Ambulatory Visit (HOSPITAL_BASED_OUTPATIENT_CLINIC_OR_DEPARTMENT_OTHER): Admitting: Pharmacist Clinician (PhC)/ Clinical Pharmacy Specialist

## 2023-09-10 ENCOUNTER — Telehealth: Payer: Self-pay

## 2023-09-10 ENCOUNTER — Encounter (HOSPITAL_BASED_OUTPATIENT_CLINIC_OR_DEPARTMENT_OTHER): Payer: Self-pay | Admitting: Pharmacist Clinician (PhC)/ Clinical Pharmacy Specialist

## 2023-09-10 ENCOUNTER — Other Ambulatory Visit (HOSPITAL_COMMUNITY): Payer: Self-pay

## 2023-09-10 DIAGNOSIS — E785 Hyperlipidemia, unspecified: Secondary | ICD-10-CM

## 2023-09-10 NOTE — Telephone Encounter (Signed)
 Please do PA for Repatha

## 2023-09-10 NOTE — Telephone Encounter (Signed)
 Pharmacy Patient Advocate Encounter   Received notification from Physician's Office that prior authorization for REPATHA is required/requested.   Insurance verification completed.   The patient is insured through [employer] .   Per test claim: PA required; PA submitted to above mentioned insurance via CoverMyMeds Key/confirmation #/[id number] Status is pending

## 2023-09-10 NOTE — Patient Instructions (Signed)
 Your Results:             Your most recent labs Goal  Total Cholesterol 165 < 200  Triglycerides 103 < 150  HDL (happy/good cholesterol) 50 > 40  LDL (lousy/bad cholesterol 96 < 55  Lipoprotein A  192 < 75   Medication changes:  We will start the process to get Repatha covered by your insurance.  Once the prior authorization is complete, I will call to let you know and confirm pharmacy information.   You will take one injection every 14 days  Lab orders:  We want to repeat labs after 2-3 months.  We will send you a lab order to remind you once we get closer to that time.    Patient Assistance:    We will sign you up for a Healthwell Grant once your medication is approved by [employer].  I will call you with the ID number, then you will take this information to the pharmacy.  They will bill it after your insurance, bringing your copay to $0.  The grant will pay the first $2,500 in a one year period.    ID   BIN [id number]  PCN [id number]  GRP [id number]    Thank you for choosing [facility]

## 2023-09-10 NOTE — Assessment & Plan Note (Signed)
 Assessment: Patient with ASCVD not at LDL goal of < 55 Most recent LDL 96 on 08/06/23 Has been compliant with high intensity statin/ezetimibe  : rosuvastatin  40 mg, ezetimibe  10 mg Reviewed options for lowering LDL cholesterol, including PCSK-9 inhibitors and inclisiran.  Discussed mechanisms of action, dosing, side effects, potential decreases in LDL cholesterol and costs.  Also reviewed potential options for patient assistance.  Plan: Patient agreeable to starting Repatha 140 mg q14d Repeat labs after:  3 months Lipid Liver function Patient was given information on [employer] - will sign patient up when PA approved Marital status Income < $75,000 (single)

## 2023-09-10 NOTE — Progress Notes (Signed)
 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 male patient of Dr Loetta Ringer, in the office today to discuss options for cholesterol management.  He will be transitioning to Dr. Arlester Ladd when Dr. Rodrick Clapper later this year.  Insurance Carrier:  [employer] [phone]   $250 deductible, $47/month   Pharmacy: [facility]    Healthwell:  yes  LDL Cholesterol goal:  LDL < 55  Current Medications:   rosuvastatin  40 mg daily, ezetimibe  10  Previously tried:    Family Hx:  father died by suicide at 55, mother Parkinson's dementia at 44; 1 sister with no known issues, 2 children healthy   Social Hx: Tobacco: no Alcohol: occasionally  Diet: eats out regularly, lives alone; has been avoiding veggies - recent problems with elevated potassium; variety of proteins; sausage biscuits for breakfast; has sweet tooth, snacks regularly     Exercise: walks 2-3 miles per day    Accessory Clinical Findings   Lab Results  Component Value Date   CHOL 165 08/06/2023   HDL 50 08/06/2023   LDLCALC 96 08/06/2023   TRIG 103 08/06/2023   CHOLHDL 3.3 08/06/2023    Lipoprotein (a)  Date/Time Value Ref Range Status  08/15/2022 08:08 AM 192.0 (H) <75.0 nmol/L Final    Comment:    **Results verified by repeat testing** Note:  Values greater than or equal to 75.0 nmol/L may        indicate an independent risk factor for CHD,        but must be evaluated with caution when applied        to non-Caucasian populations due to the         influence of genetic factors on Lp(a) across        ethnicities.     Lab Results  Component Value Date   ALT 14 08/12/2023   AST 19 08/12/2023   ALKPHOS 125 (H) 08/12/2023   BILITOT 0.5 08/12/2023   Lab Results  Component Value Date   CREATININE 1.27 08/19/2023   BUN 25 08/19/2023   NA 140 08/19/2023   K 5.3 (H) 08/19/2023   CL 105 08/19/2023   CO2 22 08/19/2023   Lab Results  Component Value Date   HGBA1C 6.0 (H) 08/15/2022    Home Medications    Current Outpatient Medications  Medication Sig Dispense Refill   carvedilol  (COREG ) 6.25 MG tablet TAKE ONE TABLET BY MOUTH TWICE DAILY WITH A MEAL 180 tablet 3   ELIQUIS  5 MG TABS tablet TAKE ONE TABLET BY MOUTH TWICE DAILY 180 tablet 1   ezetimibe  (ZETIA ) 10 MG tablet Take 1 tablet (10 mg total) by mouth daily. 90 tablet 3   tamsulosin  (FLOMAX ) 0.4 MG CAPS capsule Take 0.4 mg by mouth daily.     rosuvastatin  (CRESTOR ) 40 MG tablet Take 1 tablet (40 mg total) by mouth daily. 90 tablet 3   No current facility-administered medications for this visit.  Assessment & Plan    Hyperlipidemia LDL goal <55 Assessment: Patient with ASCVD not at LDL goal of < 55 Most recent LDL 96 on 08/06/23 Has been compliant with high intensity statin/ezetimibe  : rosuvastatin  40 mg, ezetimibe  10 mg Reviewed options for lowering LDL cholesterol, including PCSK-9 inhibitors and inclisiran.  Discussed mechanisms of action, dosing, side effects, potential decreases in LDL cholesterol and costs.  Also reviewed potential options for patient assistance.  Plan: Patient agreeable to starting Repatha 140 mg q14d Repeat labs after:  3 months Lipid Liver function Patient was given information on [employer] - will sign patient up when PA approved Marital status Income < $75,000 (single)   Donivan Furry, PharmD CPP [facility] [address]  [place], [place] [address] [phone]  09/10/2023, 3:25 PM

## 2023-09-10 NOTE — Telephone Encounter (Signed)
 PA request has been Submitted. New Encounter has been or will be created for follow up. For additional info see Pharmacy Prior Auth telephone encounter from 09/10/23.

## 2023-09-10 NOTE — Telephone Encounter (Signed)
 Pharmacy Patient Advocate Encounter  Received notification from [employer] that Prior Authorization for REPATHA has been APPROVED from 05/15/23 to 05/13/24. Ran test claim, Copay is $47. This test claim was processed through [facility]- copay amounts may vary at other pharmacies due to pharmacy/plan contracts, or as the patient moves through the different stages of their insurance plan.

## 2023-09-13 ENCOUNTER — Other Ambulatory Visit (HOSPITAL_COMMUNITY): Payer: Self-pay

## 2023-09-13 ENCOUNTER — Encounter: Payer: Self-pay | Admitting: Pharmacy Technician

## 2023-09-13 ENCOUNTER — Telehealth: Payer: Self-pay | Admitting: Pharmacy Technician

## 2023-09-13 NOTE — Telephone Encounter (Signed)
 Pt requesting a c/b in regards to previous phone note.

## 2023-09-13 NOTE — Telephone Encounter (Signed)
 Patient Advocate Encounter   The patient was approved for a Healthwell grant that will help cover the cost of repatha Total amount awarded, 2500.00.  Effective: 08/14/23 - 08/12/24   EAV:[id number] BJY:[id number] Healthwell ID: [id number]   Pharmacy provided with approval and processing information. Patient informed via mychart

## 2023-09-13 NOTE — Telephone Encounter (Signed)
 Patient called . He states he was calling to see if Repatha has been approved. RN informed patient . Repatha has been approved until 05/13/24.  Per PA team medication should cost him about $47   Patient states  the pharmacist he spoke to ( Kristin)  on 09/10/23. Told him the medication would be free.  RN informed patient will send this message to pharmacist to respond.   Medication needs to be sent to pharmacy  Patient verbalized understanding

## 2023-09-16 ENCOUNTER — Other Ambulatory Visit (HOSPITAL_COMMUNITY): Payer: Self-pay

## 2023-09-16 ENCOUNTER — Encounter: Payer: Self-pay | Admitting: Pharmacy Technician

## 2023-09-16 ENCOUNTER — Telehealth: Payer: Self-pay | Admitting: Cardiovascular Disease

## 2023-09-16 ENCOUNTER — Telehealth: Payer: Self-pay | Admitting: Pharmacy Technician

## 2023-09-16 DIAGNOSIS — E785 Hyperlipidemia, unspecified: Secondary | ICD-10-CM

## 2023-09-16 MED ORDER — REPATHA SURECLICK 140 MG/ML ~~LOC~~ SOAJ: 140.0000 mg | SUBCUTANEOUS | 3 refills | Status: AC

## 2023-09-16 NOTE — Telephone Encounter (Signed)
 Pt c/o medication issue:  1. Name of Medication:   Repatha  2. How are you currently taking this medication (dosage and times per day)?   Not taking yet  3. Are you having a reaction (difficulty breathing--STAT)?   4. What is your medication issue?   Patient called to follow-up on his prescription for Repatha being sent to [facility] [place], [place] - [address]

## 2023-09-16 NOTE — Telephone Encounter (Signed)
 Pharmacy Patient Advocate Encounter  Received notification from [employer] that Prior Authorization for REPATHA has been APPROVED from 05/15/23 to 05/13/24  [facility] and they ran on grant as well. Now 0.00

## 2023-09-16 NOTE — Addendum Note (Signed)
 Addended by: Lashica Hannay L on: 09/16/2023 03:07 PM   Modules accepted: Orders

## 2023-10-10 ENCOUNTER — Ambulatory Visit: Admitting: Pharmacist

## 2023-10-23 DIAGNOSIS — M1711 Unilateral primary osteoarthritis, right knee: Secondary | ICD-10-CM | POA: Diagnosis not present

## 2023-12-05 ENCOUNTER — Telehealth: Payer: Self-pay | Admitting: Cardiovascular Disease

## 2023-12-05 DIAGNOSIS — I480 Paroxysmal atrial fibrillation: Secondary | ICD-10-CM

## 2023-12-05 MED ORDER — APIXABAN 5 MG PO TABS: 5.0000 mg | ORAL_TABLET | Freq: Two times a day (BID) | ORAL | 1 refills | Status: DC

## 2023-12-05 NOTE — Telephone Encounter (Signed)
*  STAT* If patient is at the pharmacy, call can be transferred to refill team.   1. Which medications need to be refilled? (please list name of each medication and dose if known)   ELIQUIS  5 MG TABS tablet   2. Which pharmacy/location (including street and city if local pharmacy) is medication to be sent to? [id number] - [place], [place] - [address] Phone: [phone]  Fax: [phone]   3. Do they need a 30 day or 90 day supply? 90

## 2023-12-05 NOTE — Telephone Encounter (Signed)
 Prescription refill request for Eliquis  received. Indication:  Afib  Last office visit:04/02/23 [place])  Scr: 1.27 (08/19/23)  Age: 78 Weight: 109.8kg  Appropriate dose, Refill sent.

## 2023-12-06 ENCOUNTER — Telehealth: Payer: Self-pay | Admitting: Cardiovascular Disease

## 2023-12-06 ENCOUNTER — Other Ambulatory Visit: Payer: Self-pay

## 2023-12-06 DIAGNOSIS — I480 Paroxysmal atrial fibrillation: Secondary | ICD-10-CM

## 2023-12-06 MED ORDER — APIXABAN 5 MG PO TABS: 5.0000 mg | ORAL_TABLET | Freq: Two times a day (BID) | ORAL | 1 refills | Status: DC

## 2023-12-06 NOTE — Telephone Encounter (Signed)
*  STAT* If patient is at the pharmacy, call can be transferred to refill team.   1. Which medications need to be refilled? (please list name of each medication and dose if known) Eliquis - Patient said medicine was called to the wrong pharmacy   2. Would you like to learn more about the convenience, safety, & potential cost savings by using the [facility]     3. Are you open to using the [facility] ([facility].    4. Which pharmacy/location (including street and city if local pharmacy) is medication to be sent to?[facility]    [address] [place]  5. Do they need a 30 day or 90 day supply? 90 days and refills- please call today

## 2023-12-06 NOTE — Telephone Encounter (Signed)
 Prescription refill request for Eliquis  received. Indication:afib Last office visit:4/25 Scr:1.31  3/25 Age: 78 Weight:109.8  kg  Prescription refilled

## 2023-12-11 ENCOUNTER — Other Ambulatory Visit: Payer: Self-pay

## 2023-12-11 MED ORDER — EZETIMIBE 10 MG PO TABS: 10.0000 mg | ORAL_TABLET | Freq: Every day | ORAL | 2 refills | Status: DC

## 2024-01-01 DIAGNOSIS — E785 Hyperlipidemia, unspecified: Secondary | ICD-10-CM | POA: Diagnosis not present

## 2024-01-02 LAB — LIPOPROTEIN A (LPA): Lipoprotein (a): 87.5 nmol/L — ABNORMAL HIGH (ref <75.0)

## 2024-01-02 LAB — LIPID PANEL
Chol/HDL Ratio: 1.9 ratio (ref 0.0–5.0)
Cholesterol, Total: 77 mg/dL — ABNORMAL LOW (ref 100–199)
HDL: 41 mg/dL (ref >39)
LDL Chol Calc (NIH): 17 mg/dL (ref 0–99)
Triglycerides: 95 mg/dL (ref 0–149)
VLDL Cholesterol Cal: 19 mg/dL (ref 5–40)

## 2024-01-03 ENCOUNTER — Ambulatory Visit: Payer: Self-pay | Admitting: Pharmacist Clinician (PhC)/ Clinical Pharmacy Specialist

## 2024-01-03 ENCOUNTER — Telehealth: Payer: Self-pay | Admitting: Cardiovascular Disease

## 2024-01-03 NOTE — Telephone Encounter (Signed)
 Pt c/o medication issue:  1. Name of Medication:   Evolocumab  (REPATHA  SURECLICK) 140 MG/ML SOAJ   2. How are you currently taking this medication (dosage and times per day)?   As prescribed  3. Are you having a reaction (difficulty breathing--STAT)?   No  4. What is your medication issue?   Patient stated he has been taking this medication for 3 months and recently had lab work done which showed his Lipoprotein level was 87.5.  Patient wants a call back to discuss next steps.

## 2024-01-07 NOTE — Telephone Encounter (Signed)
 See lab results, message to patient to d/c ezetimibe .

## 2024-01-08 ENCOUNTER — Other Ambulatory Visit: Payer: Self-pay | Admitting: Student

## 2024-01-09 ENCOUNTER — Telehealth: Payer: Self-pay | Admitting: Cardiovascular Disease

## 2024-01-09 MED ORDER — CARVEDILOL 6.25 MG PO TABS: 6.2500 mg | ORAL_TABLET | Freq: Two times a day (BID) | ORAL | 1 refills | Status: DC

## 2024-01-09 NOTE — Telephone Encounter (Signed)
*  STAT* If patient is at the pharmacy, call can be transferred to refill team.   1. Which medications need to be refilled? (please list name of each medication and dose if known) carvedilol  (COREG ) 6.25 MG tablet    2. Would you like to learn more about the convenience, safety, & potential cost savings by using the [facility]?    3. Are you open to using the [facility] ([facility].  ).   4. Which pharmacy/location (including street and city if local pharmacy) is medication to be sent to? [facility] - [place], [place] - [address]    5. Do they need a 30 day or 90 day supply? 90 day

## 2024-01-09 NOTE — Telephone Encounter (Signed)
 RX sent in

## 2024-02-03 ENCOUNTER — Ambulatory Visit: Attending: Cardiovascular Disease | Admitting: Cardiovascular Disease

## 2024-02-03 ENCOUNTER — Encounter: Payer: Self-pay | Admitting: Cardiovascular Disease

## 2024-02-03 VITALS — BP 120/70 | HR 55 | Ht 74.0 in | Wt 239.6 lb

## 2024-02-03 DIAGNOSIS — I480 Paroxysmal atrial fibrillation: Secondary | ICD-10-CM

## 2024-02-03 DIAGNOSIS — I25708 Atherosclerosis of coronary artery bypass graft(s), unspecified, with other forms of angina pectoris: Secondary | ICD-10-CM | POA: Diagnosis not present

## 2024-02-03 DIAGNOSIS — I5022 Chronic systolic (congestive) heart failure: Secondary | ICD-10-CM | POA: Diagnosis not present

## 2024-02-03 DIAGNOSIS — R0789 Other chest pain: Secondary | ICD-10-CM | POA: Diagnosis not present

## 2024-02-03 NOTE — Progress Notes (Signed)
 Cardiology Office Note:    Date:  02/03/2024   ID:  Timothy Munoz, DOB 31-May-1945, MRN [id number]  PCP:  Nanci Senior, MD   [facility] Providers Cardiologist:  Ozell Fell, MD     Referring MD: Nanci Senior, MD   Chief Complaint  Patient presents with   Coronary Artery Disease    History of Present Illness:    Timothy Munoz is a 78 y.o. male with a hx of coronary artery disease, presenting for follow-up evaluation.  The patient initially presented with an anterolateral STEMI in 2022 treated with balloon angioplasty, complicated by cardiogenic shock.  Because of high risk anatomy he underwent CABG during his index hospitalization.  He had some problems with both atrial fibrillation and flutter since the time of his heart attack.  LVEF has been in the range of 30 to 35% with akinesis of the distal anterior wall, apex, and inferior walls.  The patient is here alone today.  He has been followed by Dr. Burnard and I will be assuming his cardiac care moving forward.  He is doing well with no reported functional limitation.  He denies shortness of breath or lower extremity edema.  He has had no heart palpitations recently.  States that when he is in A-fib he feels poorly, specifically with shortness of breath and exercise intolerance.  Does not feel like he has had any atrial fibrillation in the past year.  He has occasional left-sided chest pressure but states that it is rare and he has not been concerned about it.  He has not required any nitroglycerin .  In reviewing his history of GDMT, he has been unable to take low-dose Entresto  or spironolactone  because of hyperkalemia.  He could not tolerate an SGLT2 because of urinary tract infections.  He has been managed with carvedilol .  He was started on evolocumab  earlier this year and feels like he is doing very well with that.  Current Medications: Current Meds  Medication Sig   apixaban  (ELIQUIS ) 5 MG TABS tablet Take 1 tablet  (5 mg total) by mouth 2 (two) times daily.   carvedilol  (COREG ) 6.25 MG tablet Take 1 tablet (6.25 mg total) by mouth 2 (two) times daily with a meal.   Evolocumab  (REPATHA  SURECLICK) 140 MG/ML SOAJ Inject 140 mg into the skin every 14 (fourteen) days.   rosuvastatin  (CRESTOR ) 40 MG tablet Take 1 tablet (40 mg total) by mouth daily.   tamsulosin  (FLOMAX ) 0.4 MG CAPS capsule Take 0.4 mg by mouth daily.     Allergies:   Patient has no known allergies.   ROS:   Please see the history of present illness.    All other systems reviewed and are negative.  EKGs/Labs/Other Studies Reviewed:    The following studies were reviewed today: Cardiac Studies & Procedures   ______________________________________________________________________________________________ CARDIAC CATHETERIZATION  CARDIAC CATHETERIZATION 01/16/2021  Conclusion   Mid LM to Dist LM lesion is 20% stenosed.   Ost LAD to Prox LAD lesion is 100% stenosed.   Ost Cx lesion is 90% stenosed.   Ost RCA lesion is 20% stenosed.   Post intervention, there is a 60% residual stenosis.   There is moderate left ventricular systolic dysfunction.   LV end diastolic pressure is moderately elevated.   The left ventricular ejection fraction is 35-45% by visual estimate.  Acute anterolateral myocardial infarction secondary to total near ostial occlusion of a large LAD which wraps around the LV apex.  Large normal ramus intermediate vessel.  Left circumflex vessel with 85 to 90% ostial stenosis.  Large RCA with 20% irregularity proximally.  PTCA of the LAD ostium with hypotension and idioventricular rhythm with reperfusion requiring initiation of Levophed  reestablishment of TIMI-3 flow to a large LAD vessel which wraps around the LV apex.  Insertion of an intra-aortic balloon pump for optimal hemodynamic support with augmented diastolic pressure at 150 millimeters mercury.  RECOMMENDATION: In light of the patient's complex anatomy,  stenting was not performed due to high likelihood of jailing a very large ramus intermediate vessel and with concomitant ostial circumflex stenosis it was felt that surgical revascularization once patient stabilizes from his myocardial infarction would be optimal therapy.  He did receive 1 dose of oral Brilinta  in the laboratory which will not be continued.  We will plan 2D echo Doppler today.  Initiate aggressive lipid-lowering therapy.  Dr. Shyrl was contacted who will see the patient and depending upon the patient;s hemodynamic stability perform CABG revascularization surgery this week following washout of his initial Brilinta  dose.  Findings Coronary Findings Diagnostic  Dominance: Right  Left Main Mid LM to Dist LM lesion is 20% stenosed.  Left Anterior Descending Ost LAD to Prox LAD lesion is 100% stenosed.  Left Circumflex Ost Cx lesion is 90% stenosed.  Right Coronary Artery Ost RCA lesion is 20% stenosed.  Intervention  Ost LAD to Prox LAD lesion Angioplasty Post-Intervention Lesion Assessment The intervention was successful. Pre-interventional TIMI flow is 0. Post-intervention TIMI flow is 3. No complications occurred at this lesion. There is a 60% residual stenosis post intervention.     ECHOCARDIOGRAM  ECHOCARDIOGRAM COMPLETE 07/03/2023  Narrative ECHOCARDIOGRAM REPORT    Patient Name:   Timothy Munoz Date of Exam: 07/03/2023 Medical Rec #:  [id number]       Height:       74.0 in Accession #:    [id number]      Weight:       245.4 lb Date of Birth:  [birth year]       BSA:          2.371 m Patient Age:    77 years        BP:           120/78 mmHg Patient Gender: M               HR:           57 bpm. Exam Location:  [address]  Procedure: 2D Echo, Cardiac Doppler, Color Doppler and Intracardiac Opacification Agent (Both Spectral and Color Flow Doppler were utilized during procedure).  Indications:    I42.9 Cardiomyopathy  History:        Patient has  prior history of Echocardiogram examinations, most recent 06/19/2022. CHF, Previous Myocardial Infarction and CAD; Risk Factors:Hypertension and Dyslipidemia.  Sonographer:    Carl Rodgers-Jones RDCS Referring Phys: [id number] THOMAS A KELLY  IMPRESSIONS   1. Left ventricular ejection fraction, by estimation, is 30 to 35%. The left ventricle has moderately decreased function. The left ventricle demonstrates regional wall motion abnormalities (see scoring diagram/findings for description). The left ventricular internal cavity size was moderately dilated. Left ventricular diastolic parameters are consistent with Grade I diastolic dysfunction (impaired relaxation). 2. Right ventricular systolic function is normal. The right ventricular size is normal. Tricuspid regurgitation signal is inadequate for assessing PA pressure. 3. The mitral valve is normal in structure. Mild and eccentric mitral valve regurgitation. No evidence of mitral stenosis. 4. The aortic valve is tricuspid. There is  mild calcification of the aortic valve. There is mild thickening of the aortic valve. Aortic valve regurgitation is not visualized. No aortic stenosis is present. 5. Aortic dilatation noted. There is mild dilatation of the ascending aorta, measuring 43 mm. 6. The inferior vena cava is normal in size with greater than 50% respiratory variability, suggesting right atrial pressure of 3 mmHg.  Conclusion(s)/Recommendation(s): No left ventricular mural or apical thrombus/thrombi.  FINDINGS Left Ventricle: Left ventricular ejection fraction, by estimation, is 30 to 35%. The left ventricle has moderately decreased function. The left ventricle demonstrates regional wall motion abnormalities. Definity  contrast agent was given IV to delineate the left ventricular endocardial borders. The left ventricular internal cavity size was moderately dilated. There is no left ventricular hypertrophy. Left ventricular diastolic parameters are  consistent with Grade I diastolic dysfunction (impaired relaxation).   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 The anterior septum, apical lateral segment, mid inferoseptal segment, apical anterior segment, and apical inferior segment are akinetic. The basal inferoseptal segment is hypokinetic.  Right Ventricle: The right ventricular size is normal. No increase in right ventricular wall thickness. Right ventricular systolic function is normal. Tricuspid regurgitation signal is inadequate for assessing PA pressure. The tricuspid regurgitant velocity is 1.86 m/s, and with an assumed right atrial pressure of 3 mmHg, the estimated right ventricular systolic pressure is 16.8 mmHg.  Left Atrium: Left atrial size was normal in size.  Right Atrium: Right atrial size was normal in size.  Pericardium: There is no evidence of pericardial effusion.  Mitral Valve: The mitral valve is normal in structure. Mild and eccentric mitral valve regurgitation. No evidence of mitral valve stenosis.  Tricuspid Valve: The tricuspid valve is normal in structure. Tricuspid valve regurgitation is trivial. No evidence of tricuspid stenosis.  Aortic Valve: The aortic valve is tricuspid. There is mild calcification of the aortic valve. There is mild thickening of the aortic valve. Aortic valve regurgitation is not visualized. No aortic stenosis is present.  Pulmonic Valve: The pulmonic valve was normal in structure. Pulmonic valve regurgitation is not visualized. No evidence of pulmonic stenosis.  Aorta: The aortic root is normal in size and structure and aortic dilatation noted. There is mild dilatation of the ascending aorta, measuring 43 mm.  Venous: The inferior vena cava is normal in size with greater than 50% respiratory variability, suggesting right atrial pressure of 3 mmHg.  IAS/Shunts: No atrial level shunt detected by color flow Doppler.   LEFT VENTRICLE PLAX 2D LVIDd:         6.40 cm    Diastology LVIDs:         4.50 cm   LV e' medial:    8.54 cm/s LV PW:         0.70 cm   LV E/e' medial:  8.4 LV IVS:        0.70 cm   LV e' lateral:   8.60 cm/s LVOT diam:     2.20 cm   LV E/e' lateral: 8.4 LV SV:         59 LV SV Index:   25 LVOT Area:     3.80 cm   RIGHT VENTRICLE             IVC RV Basal diam:  5.00 cm     IVC diam: 1.20 cm RV S prime:     10.60 cm/s TAPSE (M-mode): 2.8 cm  LEFT ATRIUM  Index        RIGHT ATRIUM           Index LA diam:        4.80 cm 2.02 cm/m   RA Area:     18.20 cm LA Vol (A2C):   53.8 ml 22.69 ml/m  RA Volume:   58.70 ml  24.76 ml/m LA Vol (A4C):   72.2 ml 30.45 ml/m LA Biplane Vol: 63.7 ml 26.87 ml/m AORTIC VALVE LVOT Vmax:   66.80 cm/s LVOT Vmean:  44.000 cm/s LVOT VTI:    0.156 m  AORTA Ao Root diam: 3.80 cm Ao Asc diam:  4.30 cm  MITRAL VALVE                TRICUSPID VALVE MV Area (PHT): 3.17 cm     TR Peak grad:   13.8 mmHg MV Decel Time: 240 msec     TR Vmax:        186.00 cm/s MV E velocity: 71.80 cm/s MV A velocity: 103.00 cm/s  SHUNTS MV E/A ratio:  0.70         Systemic VTI:  0.16 m Systemic Diam: 2.20 cm  Soyla Merck MD Electronically signed by Soyla Merck MD Signature Date/Time: 07/03/2023/2:23:39 PM    Final   TEE  ECHO TEE 09/20/2021  Narrative TRANSESOPHOGEAL ECHO REPORT    Patient Name:   Timothy Munoz Date of Exam: 09/20/2021 Medical Rec #:  [id number]       Height:       75.0 in Accession #:    [id number]      Weight:       204.7 lb Date of Birth:  [birth year]       BSA:          2.216 m Patient Age:    76 years        BP:           105/83 mmHg Patient Gender: M               HR:           102 bpm. Exam Location:  Inpatient  Procedure: Transesophageal Echo, Cardiac Doppler, Color Doppler and 3D Echo  Indications:     atrail fibrillation  History:         Patient has prior history of Echocardiogram examinations, most recent 09/18/2021. CHF, CAD, Prior CABG;  Risk Factors:Hypertension and Dyslipidemia.  Sonographer:     Annabella Fell RDCS Referring Phys:  [id number] ROLLO JONELLE LOUDER Diagnosing Phys: Oneil Parchment MD  PROCEDURE: After discussion of the risks and benefits of a TEE, an informed consent was obtained from the patient. TEE procedure time was 12 minutes. The transesophogeal probe was passed without difficulty through the esophogus of the patient. Imaged were obtained with the patient in a left lateral decubitus position. Sedation performed by different physician. The patient was monitored while under deep sedation. Anesthestetic sedation was provided intravenously by Anesthesiology: 177.93mg  of Propofol , 100mg  of Lidocaine . Image quality was good. The patient's vital signs; including heart rate, blood pressure, and oxygen saturation; remained stable throughout the procedure. The patient developed no complications during the procedure. A successful direct current cardioversion was performed at 200 joules with 1 attempt.  IMPRESSIONS   1. Left ventricular ejection fraction, by estimation, is 20 to 25%. The left ventricle has severely decreased function. The left ventricle has no regional wall motion abnormalities. 2. Right ventricular systolic function is normal. The right ventricular size  is normal. 3. No left atrial/left atrial appendage thrombus was detected. 4. PISA radius 0.4 cm Annular diameter 3.07 cm Mal Coaptation 0.26 cm MR ERO 0.13 cm2 MR Vol 12 ml Normal pulmonary vein flow. The mitral valve is degenerative. Moderate mitral valve regurgitation. No evidence of mitral stenosis. 5. The aortic valve is normal in structure. Aortic valve regurgitation is not visualized. No aortic stenosis is present. 6. There is mild (Grade II) plaque. 7. The inferior vena cava is normal in size with greater than 50% respiratory variability, suggesting right atrial pressure of 3 mmHg.  FINDINGS Left Ventricle: Left ventricular ejection fraction,  by estimation, is 20 to 25%. The left ventricle has severely decreased function. The left ventricle has no regional wall motion abnormalities. The left ventricular internal cavity size was normal in size. There is no left ventricular hypertrophy.  Right Ventricle: The right ventricular size is normal. No increase in right ventricular wall thickness. Right ventricular systolic function is normal.  Left Atrium: Left atrial size was normal in size. No left atrial/left atrial appendage thrombus was detected.  Right Atrium: Right atrial size was normal in size.  Pericardium: There is no evidence of pericardial effusion.  Mitral Valve: PISA radius 0.4 cm Annular diameter 3.07 cm Mal Coaptation 0.26 cm MR ERO 0.13 cm2 MR Vol 12 ml Normal pulmonary vein flow. The mitral valve is degenerative in appearance. Moderate mitral valve regurgitation. No evidence of mitral valve stenosis.  Tricuspid Valve: The tricuspid valve is normal in structure. Tricuspid valve regurgitation is mild . No evidence of tricuspid stenosis.  Aortic Valve: The aortic valve is normal in structure. Aortic valve regurgitation is not visualized. No aortic stenosis is present.  Pulmonic Valve: The pulmonic valve was normal in structure. Pulmonic valve regurgitation is not visualized. No evidence of pulmonic stenosis.  Aorta: The aortic root is normal in size and structure. There is mild (Grade II) plaque.  Venous: The inferior vena cava is normal in size with greater than 50% respiratory variability, suggesting right atrial pressure of 3 mmHg.  IAS/Shunts: No atrial level shunt detected by color flow Doppler.   LEFT VENTRICLE PLAX 2D LVOT diam:     2.30 cm LVOT Area:     4.15 cm   MR Peak grad:    47.1 mmHg MR Mean grad:    28.5 mmHg    SHUNTS MR Vmax:         343.00 cm/s  Systemic Diam: 2.30 cm MR Vmean:        242.5 cm/s MR PISA:         1.01 cm MR PISA Eff ROA: 11 mm MR PISA Radius:  0.40 cm  Oneil Parchment  MD Electronically signed by Oneil Parchment MD Signature Date/Time: 09/20/2021/3:26:29 PM    Final  MONITORS  LONG TERM MONITOR-LIVE TELEMETRY (3-14 DAYS) 12/21/2021  Narrative Patch Wear Time:  13 days and 0 hours (2023-07-19T19:02:17-0400 to 2023-08-01T19:31:35-0400)  Patient had a min HR of 48 bpm, max HR of 167 bpm, and avg HR of 67 bpm. Predominant underlying rhythm was Sinus Rhythm. First Degree AV Block was present. 2 Ventricular Tachycardia runs occurred, the run with the fastest interval lasting 7 beats with a max rate of 167 bpm (avg 129 bpm); the run with the fastest interval was also the longest. 8 Supraventricular Tachycardia runs occurred, the run with the fastest interval lasting 10 beats with a max rate of 150 bpm (avg 144 bpm); the run with the fastest interval was also the  longest. Isolated SVEs were rare (<1.0%), SVE Couplets were rare (<1.0%), and SVE Triplets were rare (<1.0%). Isolated VEs were rare (<1.0%, 413), VE Couplets were rare (<1.0%, 1), and VE Triplets were rare (<1.0%, 1).  The predominant rhythm was sinus rhythm with an average rate of 67 bpm with a range of 48-1 67.  There were 2 episodes of nonsustained VT longest interval at 7 beats.  There were 8 brief episodes of SVT.  There were no episodes of atrial fibrillation or prolonged pauses.       ______________________________________________________________________________________________      EKG:   EKG Interpretation Date/Time:  Monday February 03 2024 11:22:07 EDT Ventricular Rate:  55 PR Interval:  210 QRS Duration:  108 QT Interval:  448 QTC Calculation: 428 R Axis:   165  Text Interpretation: Sinus bradycardia with 1st degree A-V block Minimal voltage criteria for LVH, may be normal variant ( Cornell product ) Septal infarct (cited on or before 02-Apr-2023) Lateral infarct , age undetermined When compared with ECG of 05-Aug-2023 08:22, Left anterior fascicular block is no longer Present Lateral  infarct is now Present T wave inversion less evident in Lateral leads Confirmed by Wonda Sharper [phone]) on 02/03/2024 11:26:36 AM    Recent Labs: 08/06/2023: BNP 131.5; Hemoglobin 15.6; Platelets 230; TSH 2.490 08/12/2023: ALT 14 08/19/2023: BUN 25; Creatinine, Ser 1.27; Potassium 5.3; Sodium 140  Recent Lipid Panel    Component Value Date/Time   CHOL 77 (L) 01/01/2024 0838   TRIG 95 01/01/2024 0838   HDL 41 01/01/2024 0838   CHOLHDL 1.9 01/01/2024 0838   CHOLHDL 3.9 01/16/2021 0739   VLDL 7 01/16/2021 0739   LDLCALC 17 01/01/2024 0838     Risk Assessment/Calculations:    CHA2DS2-VASc Score = 4   This indicates a 4.8% annual risk of stroke. The patient's score is based upon: CHF History: 1 HTN History: 0 Diabetes History: 0 Stroke History: 0 Vascular Disease History: 1 Age Score: 2 Gender Score: 0               Physical Exam:    VS:  BP 120/70   Pulse (!) 55   Ht 6' 2 (1.88 m)   Wt 239 lb 9.6 oz (108.7 kg)   SpO2 97%   BMI 30.76 kg/m     Wt Readings from Last 3 Encounters:  02/03/24 239 lb 9.6 oz (108.7 kg)  08/05/23 242 lb (109.8 kg)  04/02/23 245 lb 6.4 oz (111.3 kg)     GEN:  Well nourished, well developed in no acute distress HEENT: Normal NECK: No JVD; No carotid bruits LYMPHATICS: No lymphadenopathy CARDIAC: RRR, no murmurs, rubs, gallops RESPIRATORY:  Clear to auscultation without rales, wheezing or rhonchi  ABDOMEN: Soft, non-tender, non-distended MUSCULOSKELETAL:  No edema; No deformity  SKIN: Warm and dry NEUROLOGIC:  Alert and oriented x 3 PSYCHIATRIC:  Normal affect   Assessment & Plan Chronic HFrEF (heart failure with reduced ejection fraction) (HCC) Reviewed his history of GDMT.  Intolerant of Entresto  and spironolactone  because of hyperkalemia and intolerant of SGLT2's because of recurrent urinary tract infections.  Continue carvedilol .  Recommend cardiac MRI to better assess LVEF since he has had an LVEF of 30 to 35% by echo  assessment.  If LVEF less than 35% confirmed, recommend referral to EP for consideration of ICD implantation.  Suspect he has significant LAD territory scar. PAF (paroxysmal atrial fibrillation) (HCC) Tolerating apixaban .  Stressed the importance of compliance with twice daily dosing.  Maintaining  sinus rhythm.  Continue carvedilol . Coronary artery disease of bypass graft of native heart with stable angina pectoris (HCC) Reports rare episodes of angina.  Treated aggressively with evolocumab , rosuvastatin , carvedilol , and apixaban .  Continue the same.  He understands that if he develops any worsening symptoms or need for nitroglycerin  use, he should seek immediate medical attention. Chest tightness Stable, rare episodes as above.  No further workup at this time.  EKG today shows no acute changes.  Today's plan: Cardiac MRI, EP referral if LVEF less than 35%, follow-up APP 6 months, continue current medications.      Medication Adjustments/Labs and Tests Ordered: Current medicines are reviewed at length with the patient today.  Concerns regarding medicines are outlined above.  Orders Placed This Encounter  Procedures   MR CARDIAC MORPHOLOGY W WO CONTRAST   Hemoglobin and hematocrit, blood   EKG 12-Lead   No orders of the defined types were placed in this encounter.   Patient Instructions  Medication Instructions:  No medication changes were made at this visit. Continue current regimen.   *If you need a refill on your cardiac medications before your next appointment, please call your pharmacy*  Lab Work: To be completed 1 week prior to cardiac MRI: hemoglobin/ hematocrit  If you have labs (blood work) drawn today and your tests are completely normal, you will receive your results only by: MyChart Message (if you have MyChart) OR A paper copy in the mail If you have any lab test that is abnormal or we need to change your treatment, we will call you to review the  results.  Testing/Procedures: Your provider has requested that you have a cardiac MRI.  Follow-Up: At [facility], you and your health needs are our priority.  As part of our continuing mission to provide you with exceptional heart care, our providers are all part of one team.  This team includes your primary Cardiologist (physician) and Advanced Practice Providers or APPs (Physician Assistants and Nurse Practitioners) who all work together to provide you with the care you need, when you need it.  Your next appointment:   6 month(s)  Provider:   One of our Advanced Practice Providers (APPs): Morse Clause, PA-C  Lamarr Satterfield, NP Miriam Shams, NP  Olivia Pavy, PA-C Josefa Beauvais, NP  Leontine Salen, PA-C Orren Fabry, PA-C  Woolsey, PA-C Ernest Dick, NP  Damien Braver, NP Jon Hails, PA-C  Waddell Donath, PA-C    Dayna Dunn, PA-C  Scott Weaver, PA-C Lum Louis, NP Katlyn West, NP Callie Goodrich, PA-C  Xika Zhao, NP Sheng Haley, PA-C    Kathleen Johnson, PA-C   Then, Ozell Fell, MD will plan to see you again in 1 year(s).    We recommend signing up for the patient portal called MyChart.  Sign up information is provided on this After Visit Summary.  MyChart is used to connect with patients for Virtual Visits (Telemedicine).  Patients are able to view lab/test results, encounter notes, upcoming appointments, etc.  Non-urgent messages can be sent to your provider as well.   To learn more about what you can do with MyChart, go to [email or web address].   Other Instructions    Cardiac MRI  [facility] [address] [place], [place] [address] [phone] Please take advantage of the free valet parking available at the [facility] and [employer] (Entrance C).  Proceed to the [facility] Radiology Department (First Floor) for check-in.    Magnetic resonance imaging (MRI) is  a painless test that produces images of the inside of the body  without using Xrays.   During an MRI, strong magnets and radio waves work together in a Data processing manager to form detailed images.    MRI images may provide more details about a medical condition than X-rays, CT scans, and ultrasounds can provide.   You may be given earphones to listen for instructions.   You may eat a light breakfast and take medications as ordered with the exception of furosemide , hydrochlorothiazide, or spironolactone (fluid pill, other). Please avoid stimulants for 12 hr prior to test. (Ie. Caffeine, nicotine, chocolate, or antihistamine medications)   If a contrast material will be used, an IV will be inserted into one of your veins. Contrast material will be injected into your IV. It will leave your body through your urine within a day. You may be told to drink plenty of fluids to help flush the contrast material out of your system.   You will be asked to remove all metal, including: Watch, jewelry, and other metal objects including hearing aids, hair pieces and dentures. Also wearable glucose monitoring systems (ie. Freestyle Libre and Omnipods) (Braces and fillings normally are not a problem.)   TEST WILL TAKE APPROXIMATELY 1 HOUR   PLEASE NOTIFY SCHEDULING AT LEAST 24 HOURS IN ADVANCE IF YOU ARE UNABLE TO KEEP YOUR APPOINTMENT. [phone]   For more information and frequently asked questions, please visit our website : [email or web address]   Please call Camie Shutter, cardiac imaging nurse navigator with any questions/concerns. Camie Shutter RN Navigator Cardiac Imaging Chantal Requena RN Navigator Cardiac Imaging [facility] [phone] Office      Signed, Ozell Fell, MD  02/03/2024 1:06 PM    [facility]

## 2024-02-03 NOTE — Assessment & Plan Note (Signed)
 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  Intolerant of Entresto  and spironolactone  because of hyperkalemia and intolerant of SGLT2's because of recurrent urinary tract infections.  Continue carvedilol .  Recommend cardiac MRI to better assess LVEF since he has had an LVEF of 30 to 35% by echo assessment.  If LVEF less than 35% confirmed, recommend referral to EP for consideration of ICD implantation.  Suspect he has significant LAD territory scar.

## 2024-02-03 NOTE — Assessment & Plan Note (Signed)
 Reports rare episodes of angina.  Treated aggressively with evolocumab , rosuvastatin , carvedilol , and apixaban .  Continue the same.  He understands that if he develops any worsening symptoms or need for nitroglycerin  use, he should seek immediate medical attention.

## 2024-02-03 NOTE — Patient Instructions (Addendum)
 Medication Instructions:  No medication changes were made at this visit. Continue current regimen.   *If you need a refill on your cardiac medications before your next appointment, please call your pharmacy*  Lab Work: To be completed 1 week prior to cardiac MRI: hemoglobin/ hematocrit  If you have labs (blood work) drawn today and your tests are completely normal, you will receive your results only by: MyChart Message (if you have MyChart) OR A paper copy in the mail If you have any lab test that is abnormal or we need to change your treatment, we will call you to review the results.  Testing/Procedures: Your provider has requested that you have a cardiac MRI.  Follow-Up: At [facility], you and your health needs are our priority.  As part of our continuing mission to provide you with exceptional heart care, our providers are all part of one team.  This team includes your primary Cardiologist (physician) and Advanced Practice Providers or APPs (Physician Assistants and Nurse Practitioners) who all work together to provide you with the care you need, when you need it.  Your next appointment:   6 month(s)  Provider:   One of our Advanced Practice Providers (APPs): Morse Clause, PA-C  Lamarr Satterfield, NP Miriam Shams, NP  Olivia Pavy, PA-C Josefa Beauvais, NP  Leontine Salen, PA-C Orren Fabry, PA-C  Mullin, PA-C Ernest Dick, NP  Damien Braver, NP Jon Hails, PA-C  Waddell Donath, PA-C    Dayna Dunn, PA-C  Scott Weaver, PA-C Lum Louis, NP Katlyn West, NP Callie Goodrich, PA-C  Xika Zhao, NP Sheng Haley, PA-C    Kathleen Johnson, PA-C   Then, Ozell Fell, MD will plan to see you again in 1 year(s).    We recommend signing up for the patient portal called MyChart.  Sign up information is provided on this After Visit Summary.  MyChart is used to connect with patients for Virtual Visits (Telemedicine).  Patients are able to view lab/test results, encounter  notes, upcoming appointments, etc.  Non-urgent messages can be sent to your provider as well.   To learn more about what you can do with MyChart, go to [email or web address].   Other Instructions    Cardiac MRI  [facility] [address] [place], [place] [address] [phone] Please take advantage of the free valet parking available at the [facility] and [employer] (Entrance C).  Proceed to the [facility] Radiology Department (First Floor) for check-in.    Magnetic resonance imaging (MRI) is a painless test that produces images of the inside of the body without using Xrays.   During an MRI, strong magnets and radio waves work together in a Data processing manager to form detailed images.    MRI images may provide more details about a medical condition than X-rays, CT scans, and ultrasounds can provide.   You may be given earphones to listen for instructions.   You may eat a light breakfast and take medications as ordered with the exception of furosemide , hydrochlorothiazide, or spironolactone (fluid pill, other). Please avoid stimulants for 12 hr prior to test. (Ie. Caffeine, nicotine, chocolate, or antihistamine medications)   If a contrast material will be used, an IV will be inserted into one of your veins. Contrast material will be injected into your IV. It will leave your body through your urine within a day. You may be told to drink plenty of fluids to help flush the contrast material out of your system.   You will be  asked to remove all metal, including: Watch, jewelry, and other metal objects including hearing aids, hair pieces and dentures. Also wearable glucose monitoring systems (ie. Freestyle Libre and Omnipods) (Braces and fillings normally are not a problem.)   TEST WILL TAKE APPROXIMATELY 1 HOUR   PLEASE NOTIFY SCHEDULING AT LEAST 24 HOURS IN ADVANCE IF YOU ARE UNABLE TO KEEP YOUR APPOINTMENT. [phone]   For more information and frequently asked  questions, please visit our website : [email or web address]   Please call Camie Shutter, cardiac imaging nurse navigator with any questions/concerns. Camie Shutter RN Navigator Cardiac Imaging Chantal Requena RN Navigator Cardiac Imaging [facility] and Vascular Services [phone] Office

## 2024-02-18 DIAGNOSIS — I480 Paroxysmal atrial fibrillation: Secondary | ICD-10-CM | POA: Diagnosis not present

## 2024-02-18 DIAGNOSIS — I5023 Acute on chronic systolic (congestive) heart failure: Secondary | ICD-10-CM | POA: Diagnosis not present

## 2024-02-18 LAB — HEMOGLOBIN AND HEMATOCRIT, BLOOD
Hematocrit: 40.1 % (ref 37.5–51.0)
Hemoglobin: 12.8 g/dL — ABNORMAL LOW (ref 13.0–17.7)

## 2024-02-26 ENCOUNTER — Encounter (HOSPITAL_COMMUNITY): Payer: Self-pay

## 2024-02-26 ENCOUNTER — Telehealth: Payer: Self-pay | Admitting: Cardiovascular Disease

## 2024-02-26 NOTE — Telephone Encounter (Signed)
 Pt c/o medication issue:  1. Name of Medication:   rosuvastatin  (CRESTOR ) 40 MG tablet    2. How are you currently taking this medication (dosage and times per day)? As written   3. Are you having a reaction (difficulty breathing--STAT)? No   4. What is your medication issue?  Pt called in stating his legs have been hurting for a few weeks. He states he has been taking this med for a while but he read it may the cause of it. Please advise.

## 2024-02-26 NOTE — Telephone Encounter (Signed)
 Spoke with pt, aware he can stop the rosuvastatin  for 2 weeks and then let us  know how he is feeling.

## 2024-02-28 ENCOUNTER — Other Ambulatory Visit: Payer: Self-pay | Admitting: Cardiovascular Disease

## 2024-02-28 ENCOUNTER — Ambulatory Visit (HOSPITAL_COMMUNITY)
Admission: RE | Admit: 2024-02-28 | Discharge: 2024-02-28 | Disposition: A | Discharge status: Home / Self Care | Source: Ambulatory Visit | Attending: Cardiovascular Disease | Admitting: Cardiovascular Disease

## 2024-02-28 DIAGNOSIS — I5022 Chronic systolic (congestive) heart failure: Secondary | ICD-10-CM

## 2024-02-28 MED ORDER — GADOBUTROL 1 MMOL/ML IV SOLN
10.0000 mL | Freq: Once | INTRAVENOUS | Status: AC | PRN
  Administered 2024-02-28: 10 mL via INTRAVENOUS

## 2024-03-01 ENCOUNTER — Ambulatory Visit: Payer: Self-pay | Admitting: Physician Assistant

## 2024-03-01 DIAGNOSIS — Z8679 Personal history of other diseases of the circulatory system: Secondary | ICD-10-CM

## 2024-03-01 DIAGNOSIS — I480 Paroxysmal atrial fibrillation: Secondary | ICD-10-CM

## 2024-04-01 ENCOUNTER — Encounter: Payer: Self-pay | Admitting: Cardiovascular Disease

## 2024-04-01 ENCOUNTER — Ambulatory Visit: Attending: Cardiovascular Disease | Admitting: Cardiovascular Disease

## 2024-04-01 VITALS — BP 134/74 | HR 66 | Resp 16 | Ht 72.0 in | Wt 245.9 lb

## 2024-04-01 DIAGNOSIS — Z01812 Encounter for preprocedural laboratory examination: Secondary | ICD-10-CM

## 2024-04-01 DIAGNOSIS — I480 Paroxysmal atrial fibrillation: Secondary | ICD-10-CM | POA: Diagnosis not present

## 2024-04-01 NOTE — Addendum Note (Signed)
 Addended by: CASIMIR ALDONA BRAVO on: 04/01/2024 06:53 PM   Modules accepted: Orders

## 2024-04-01 NOTE — Progress Notes (Signed)
  Electrophysiology Office Note:    Date:  04/01/2024   ID:  Timothy Munoz, DOB 1945-11-16, MRN [id number]  PCP:  Nanci Senior, MD   [facility] Providers Cardiologist:  Ozell Fell, MD     Referring MD: Fell Ozell, MD   History of Present Illness:    Timothy Munoz is a 78 y.o. male with a medical history significant for atrial fibrillation and flutter, CHF with reduced EF, coronary disease status post MI in 2022, referred for ICD implant.      History of Present Illness He has a history of ischemic cardiomyopathy with STEMI in 2022.  He underwent CABG during his index hospitalization.  EF was in the 30 to 50% range.  Recent cardiac MRI showed that his ejection fraction is persistently low at 28%.  There was a large LAD territory infarct visible on LGE imaging.      After his MI, he has been noted to have intermittent atrial fibrillation and flutter.  He has not had significant arrhythmia symptoms in the past year.           Today, he reports that he moves his at baseline and has no acute complaints.  EKGs/Labs/Other Studies Reviewed Today:     Echocardiogram:  TTE August 2023 LVEF 35 to 40%.  Severely dilated left atrium.  MR noted but not well-evaluated   Advanced imaging:  Cardiac MRI October 2025 LVEF 28%.  LGE shows LAD territory infarct    EKG:   EKG Interpretation Date/Time:  Wednesday April 01 2024 11:47:28 EST Ventricular Rate:  53 PR Interval:  214 QRS Duration:  104 QT Interval:  452 QTC Calculation: 424 R Axis:   123  Text Interpretation: Sinus bradycardia with 1st degree A-V block Incomplete right bundle branch block Left posterior fascicular block Minimal voltage criteria for LVH, may be normal variant ( Cornell product ) Inferior infarct , age undetermined Anteroseptal infarct (cited on or before 02-Apr-2023) When compared with ECG of 03-Feb-2024 11:22, axis shifted left Confirmed by Nancey Scotts [phone]) on  04/01/2024 12:18:30 PM     Physical Exam:    VS:  BP 134/74 (BP Location: Left Arm, Patient Position: Sitting, Cuff Size: Large)   Pulse 66   Resp 16   Ht 6' (1.829 m)   Wt 245 lb 14.4 oz (111.5 kg)   SpO2 96%   BMI 33.35 kg/m     Wt Readings from Last 3 Encounters:  04/01/24 245 lb 14.4 oz (111.5 kg)  02/03/24 239 lb 9.6 oz (108.7 kg)  08/05/23 242 lb (109.8 kg)     GEN: Well nourished, well developed in no acute distress CARDIAC: RRR, no murmurs, rubs, gallops RESPIRATORY:  Normal work of breathing MUSCULOSKELETAL: no edema    ASSESSMENT & PLAN:     CHF with reduced EF EF persistently less than 35% despite GDMT Significant scar burden in LAD territory on MRI We discussed the indication and rationale for defibrillator placement I explained risks of infection, bleeding, lead dislodgment, and a low but nonzero risk for need for prolonged hospitalization, cardiac surgery, and risk of complications that could be life-threatening.  He expressed understanding would like to proceed  Paroxysmal atrial fibrillation Has required cardioversion in the past With sinus bradycardia Will plan to place an atrial lead  Coronary artery disease Angina appears to be well-controlled at present    Signed, Scotts FORBES Nancey, MD  04/01/2024 5:09 PM    [facility]

## 2024-04-01 NOTE — Patient Instructions (Signed)
 Medication Instructions:  Your physician recommends that you continue on your current medications as directed. Please refer to the Current Medication list given to you today.  *If you need a refill on your cardiac medications before your next appointment, please call your pharmacy*  Lab Work: None ordered.  If you have labs (blood work) drawn today and your tests are completely normal, you will receive your results only by: MyChart Message (if you have MyChart) OR A paper copy in the mail If you have any lab test that is abnormal or we need to change your treatment, we will call you to review the results.  Testing/Procedures: Your physician has recommended that you have a defibrillator inserted. An implantable cardioverter defibrillator (ICD) is a small device that is placed in your chest or, in rare cases, your abdomen. This device uses electrical pulses or shocks to help control life-threatening, irregular heartbeats that could lead the heart to suddenly stop beating (sudden cardiac arrest). Leads are attached to the ICD that goes into your heart. This is done in the hospital and usually requires an overnight stay. Please see the instruction sheet given to you today for more information.   Follow-Up: At [facility], you and your health needs are our priority.  As part of our continuing mission to provide you with exceptional heart care, our providers are all part of one team.  This team includes your primary Cardiologist (physician) and Advanced Practice Providers or APPs (Physician Assistants and Nurse Practitioners) who all work together to provide you with the care you need, when you need it.  Your next appointment:   To be scheduled

## 2024-04-20 ENCOUNTER — Telehealth: Payer: Self-pay

## 2024-04-20 NOTE — Telephone Encounter (Signed)
-----   Message from Nurse Aldona R sent at 04/01/2024  6:33 PM EST ----- Regarding: 05/22/24 ICD -Initial Mealor Important: list procedure date as first item in subject line, followed by procedure type (e.g., 01/24/24 PPM implant)  Precert:  MD: Mealor Type of implant: ICD Device manufacturer: undecided Diagnosis: Cardiomyopathy CPT code: ICD implant - 33249 C-code(s), including quantity (if indicated): r8278, [id number], r8222 Procedure scheduled (date/time): 01/09 - 130pm  Procedure:  Scrub given? Yes  Medication instructions: hold your Eliquis  (Apixaban ) for 2 day(s) prior to your procedure. Your last dose will be Tuesday, January 6, PM dose. Message sent to CVRR? N/A Added to calendar? Yes Orders entered? Yes Letter complete? Yes Scheduled with cath lab? Yes Labs ordered (CBC, BMET, PT/INR if on warfarin)? Yes Dye allergy? No Pre-meds ordered and instructions given? N/A Letter method: given to patient Special instructions: N/A H&P: 11/19  Follow-up:  Cassie/Angel, please schedule Routine.  Covering RN: Aldona  Please send this message to [employer], EP scheduler, EP Scheduling pool, and EP [employer].

## 2024-04-21 ENCOUNTER — Other Ambulatory Visit: Payer: Self-pay | Admitting: Physician Assistant

## 2024-04-30 ENCOUNTER — Telehealth (HOSPITAL_COMMUNITY): Payer: Self-pay

## 2024-04-30 ENCOUNTER — Encounter (HOSPITAL_COMMUNITY): Payer: Self-pay

## 2024-04-30 NOTE — Telephone Encounter (Signed)
 Spoke with patient to complete pre-procedure call.     Health status review:  Any new medical conditions, recent signs of acute illness or been started on antibiotics? No Any recent hospitalizations or surgeries? No Any new medications started since pre-op visit? No  Follow all medication instructions prior to procedure or the procedure may be rescheduled:    HOLD your Eliquis  (Apixaban ) for 2 day(s) prior to your procedure. Your last dose will be Tuesday, January 6, PM dose.  NO eating or drinking after midnight except for sips of water with your medications not discussed. The night before your procedure and the morning of your procedure, wash thoroughly with the CHG surgical soap from the neck down, paying special attention to the area where your procedure will be performed.  Pre-procedure testing scheduled: lab work by December 26.  Confirmed patient is scheduled for  Implantable cardioverter defibrilator (ICD) on Friday, January 9 with Dr. Nancey. Instructed patient to arrive at the Main Entrance A at [facility]: [address] [place], [place] [address] and check in at Admitting at 11:30 AM.  Plan to go home the same day, you will only stay overnight if medically necessary. You MUST have a responsible adult to drive you home and MUST be with you the first 24 hours after you arrive home or your procedure could be cancelled.  Informed a nurse may call a day before the procedure to confirm arrival time and ensure instructions are followed.  Patient verbalized understanding to information provided and is agreeable to proceed with procedure.   Advised to contact RN Navigator at [phone], to inform of any new medications started after call or concerns prior to procedure.

## 2024-05-04 LAB — CBC
Hematocrit: 45.8 % (ref 37.5–51.0)
Hemoglobin: 14.2 g/dL (ref 13.0–17.7)
MCH: 27.8 pg (ref 26.6–33.0)
MCHC: 31 g/dL — ABNORMAL LOW (ref 31.5–35.7)
MCV: 90 fL (ref 79–97)
Platelets: 224 x10E3/uL (ref 150–450)
RBC: 5.11 x10E6/uL (ref 4.14–5.80)
RDW: 14 % (ref 11.6–15.4)
WBC: 8.2 x10E3/uL (ref 3.4–10.8)

## 2024-05-04 LAB — BASIC METABOLIC PANEL WITH GFR
BUN/Creatinine Ratio: 16 (ref 10–24)
BUN: 21 mg/dL (ref 8–27)
CO2: 23 mmol/L (ref 20–29)
Calcium: 9.2 mg/dL (ref 8.6–10.2)
Chloride: 105 mmol/L (ref 96–106)
Creatinine, Ser: 1.33 mg/dL — ABNORMAL HIGH (ref 0.76–1.27)
Glucose: 74 mg/dL (ref 70–99)
Potassium: 4.7 mmol/L (ref 3.5–5.2)
Sodium: 142 mmol/L (ref 134–144)
eGFR: 55 mL/min/1.73 — ABNORMAL LOW

## 2024-05-11 ENCOUNTER — Ambulatory Visit: Payer: Self-pay | Admitting: Cardiovascular Disease

## 2024-05-22 ENCOUNTER — Ambulatory Visit (HOSPITAL_COMMUNITY)

## 2024-05-22 ENCOUNTER — Ambulatory Visit (HOSPITAL_COMMUNITY)
Admission: RE | Admit: 2024-05-22 | Discharge: 2024-05-22 | Disposition: A | Discharge status: Home / Self Care | Attending: Cardiovascular Disease | Admitting: Cardiovascular Disease

## 2024-05-22 ENCOUNTER — Ambulatory Visit (HOSPITAL_COMMUNITY)
Admission: RE | Disposition: A | Discharge status: Home / Self Care | Payer: Self-pay | Source: Home / Self Care | Attending: Cardiovascular Disease

## 2024-05-22 ENCOUNTER — Other Ambulatory Visit: Payer: Self-pay

## 2024-05-22 DIAGNOSIS — I509 Heart failure, unspecified: Secondary | ICD-10-CM

## 2024-05-22 DIAGNOSIS — I480 Paroxysmal atrial fibrillation: Secondary | ICD-10-CM

## 2024-05-22 DIAGNOSIS — I429 Cardiomyopathy, unspecified: Secondary | ICD-10-CM | POA: Diagnosis not present

## 2024-05-22 HISTORY — PX: ICD IMPLANT: EP1208

## 2024-05-22 MED ORDER — ONDANSETRON HCL 4 MG/2ML IJ SOLN: 4.0000 mg | Freq: Four times a day (QID) | INTRAMUSCULAR | Status: DC | PRN

## 2024-05-22 MED ORDER — MIDAZOLAM HCL 5 MG/5ML IJ SOLN
INTRAMUSCULAR | Status: DC | PRN
  Administered 2024-05-22 (×2): 1 mg via INTRAVENOUS

## 2024-05-22 MED ORDER — CEFAZOLIN SODIUM-DEXTROSE 2-4 GM/100ML-% IV SOLN
INTRAVENOUS | Status: AC
  Administered 2024-05-22: 2 g via INTRAVENOUS
  Filled 2024-05-22: qty 100

## 2024-05-22 MED ORDER — HEPARIN (PORCINE) IN NACL 1000-0.9 UT/500ML-% IV SOLN
INTRAVENOUS | Status: DC | PRN
  Administered 2024-05-22: 500 mL

## 2024-05-22 MED ORDER — APIXABAN 5 MG PO TABS: 5.0000 mg | ORAL_TABLET | Freq: Two times a day (BID) | ORAL | Status: AC

## 2024-05-22 MED ORDER — LIDOCAINE HCL (PF) 1 % IJ SOLN
INTRAMUSCULAR | Status: DC | PRN
  Administered 2024-05-22: 50 mL

## 2024-05-22 MED ORDER — ACETAMINOPHEN 325 MG PO TABS: 325.0000 mg | ORAL_TABLET | ORAL | Status: DC | PRN

## 2024-05-22 MED ORDER — FENTANYL CITRATE (PF) 100 MCG/2ML IJ SOLN
INTRAMUSCULAR | Status: AC
  Filled 2024-05-22: qty 2

## 2024-05-22 MED ORDER — CHLORHEXIDINE GLUCONATE 4 % EX SOLN: 4.0000 | Freq: Once | CUTANEOUS | Status: DC

## 2024-05-22 MED ORDER — SODIUM CHLORIDE 0.9 % IV SOLN: 80.0000 mg | INTRAVENOUS | Status: AC

## 2024-05-22 MED ORDER — SODIUM CHLORIDE 0.9 % IV SOLN: INTRAVENOUS | Status: DC

## 2024-05-22 MED ORDER — FENTANYL CITRATE (PF) 100 MCG/2ML IJ SOLN
INTRAMUSCULAR | Status: DC | PRN
  Administered 2024-05-22 (×2): 25 ug via INTRAVENOUS

## 2024-05-22 MED ORDER — MIDAZOLAM HCL 2 MG/2ML IJ SOLN
INTRAMUSCULAR | Status: AC
  Filled 2024-05-22: qty 2

## 2024-05-22 MED ORDER — CEFAZOLIN SODIUM-DEXTROSE 2-4 GM/100ML-% IV SOLN: 2.0000 g | INTRAVENOUS | Status: AC

## 2024-05-22 MED ORDER — SODIUM CHLORIDE 0.9 % IV SOLN
INTRAVENOUS | Status: AC
  Administered 2024-05-22: 80 mg
  Filled 2024-05-22: qty 2

## 2024-05-22 MED ORDER — LIDOCAINE HCL 1 % IJ SOLN
INTRAMUSCULAR | Status: AC
  Filled 2024-05-22: qty 60

## 2024-05-22 NOTE — Discharge Instructions (Signed)
 After Your ICD (Implantable Cardiac Defibrillator)   You have a Medtronic ICD  If you have a Medtronic or Biotronik device, plug in your home monitor once you get home, and no manual interaction is required.   If you have an Abbott or [employer]ozone device, plug your home monitor once you get home, sit near the device, and press the large activation button. Sit nearby until the process is complete, usually notated by lights on the monitor.   If you were set up for monitoring using an app on your phone, make sure the app remains open in the background and the Bluetooth remains on.  ACTIVITY Do not lift your arm above shoulder height for 1 week after your procedure. After 7 days, you may progress as below.  You should remove your sling 24 hours after your procedure, unless otherwise instructed by your provider.     Friday May 29, 2024  Saturday May 30, 2024 Sunday May 31, 2024 Monday June 01, 2024   Do not lift, push, pull, or carry anything over 10 pounds with the affected arm until 6 weeks (Friday July 03, 2024 ) after your procedure.   You may drive AFTER your wound check, UNLESS you have been told otherwise by your provider.   Ask your healthcare provider when you can go back to work   INCISION/Dressing If you are on a blood thinner such as Coumadin, Xarelto, Eliquis , Plavix , or Pradaxa please confirm with your provider when this should be resumed.   If large square, outer bandage is left in place, this can be removed after 24 hours from your procedure. Do not remove steri-strips or glue as below.   Monitor your defibrillator site for redness, swelling, and drainage. Call the device clinic at [phone] if you experience these symptoms or fever/chills.    If your incision is sealed with Steri-strips or staples, you may shower 7 days after your procedure or when told by your provider. Do not remove the steri-strips or let the shower hit directly on your site.  You may wash around your site with soap and water.    If you were discharged in a sling, please do not wear this during the day more than 48 hours after your surgery unless otherwise instructed. This may increase the risk of stiffness and soreness in your shoulder.   Avoid lotions, ointments, or perfumes over your incision until it is well-healed.  You may use a hot tub or a pool AFTER your wound check appointment if the incision is completely closed.  Your ICD is designed to protect you from life threatening heart rhythms. Because of this, you may receive a shock.   1 shock with no symptoms:  Call the office during business hours. 1 shock with symptoms (chest pain, chest pressure, dizziness, lightheadedness, shortness of breath, overall feeling unwell):  Call 911. If you experience 2 or more shocks in 24 hours:  Call 911. If you receive a shock, you should not drive for 6 months per the [place] DMV IF you receive appropriate therapy from your ICD.   ICD Alerts:  Some alerts are vibratory and others beep. These are NOT emergencies. Please call our office to let us  know. If this occurs at night or on weekends, it can wait until the next business day. Send a remote transmission.  If your device is capable of reading fluid status (for heart failure), you will be offered monthly monitoring to review this with you.   DEVICE MANAGEMENT  Remote monitoring is used to monitor your ICD from home. This monitoring is scheduled every 91 days by our office. It allows us  to keep an eye on the functioning of your device to ensure it is working properly. You will routinely see your Electrophysiologist annually (more often if necessary). This will appear as a REMOTE check on your MyChart schedule. These are automatic and there is nothing for you to manually do unless otherwise instructed.  You should receive your ID card for your new device in 4-8 weeks. Keep this card with you at all times once received. Consider  wearing a medical alert bracelet or necklace.  Your ICD  may be MRI compatible. This will be discussed at your next office visit/wound check.  You should avoid contact with strong electric or magnetic fields.   Do not use amateur (ham) radio equipment or electric (arc) welding torches. MP3 player headphones with magnets should not be used. Some devices are safe to use if held at least 12 inches (30 cm) from your defibrillator. These include power tools, lawn mowers, and speakers. If you are unsure if something is safe to use, ask your health care provider.  When using your cell phone, hold it to the ear that is on the opposite side from the defibrillator. Do not leave your cell phone in a pocket over the defibrillator.  You may safely use electric blankets, heating pads, computers, and microwave ovens.  Call the office right away if: You have chest pain. You feel more than one shock. You feel more short of breath than you have felt before. You feel more light-headed than you have felt before. Your incision starts to open up.  This information is not intended to replace advice given to you by your health care provider. Make sure you discuss any questions you have with your health care provider.

## 2024-05-22 NOTE — H&P (Signed)
" °  Electrophysiology Office Note:    Date:  05/22/2024   ID:  Timothy Munoz, DOB [birth year], MRN [id number]  PCP:  Nanci Senior, MD   [facility] Providers Cardiologist:  Ozell Fell, MD     Referring MD: No ref. provider found   History of Present Illness:    Timothy Munoz is a 79 y.o. male with a medical history significant for atrial fibrillation and flutter, CHF with reduced EF, coronary disease status post MI in 2022, referred for ICD implant.      History of Present Illness He has a history of ischemic cardiomyopathy with STEMI in 2022.  He underwent CABG during his index hospitalization.  EF was in the 30 to 50% range.  Recent cardiac MRI showed that his ejection fraction is persistently low at 28%.  There was a large LAD territory infarct visible on LGE imaging.      After his MI, he has been noted to have intermittent atrial fibrillation and flutter.  He has not had significant arrhythmia symptoms in the past year.           Today, he reports that he moves his at baseline and has no acute complaints. He presents for placement of a dual chamber ICD. He reports that he has not had any changes in his diagnoses or medications recently.   EKGs/Labs/Other Studies Reviewed Today:     Echocardiogram:  TTE August 2023 LVEF 35 to 40%.  Severely dilated left atrium.  MR noted but not well-evaluated   Advanced imaging:  Cardiac MRI October 2025 LVEF 28%.  LGE shows LAD territory infarct    EKG:         Physical Exam:    VS:  BP (!) 140/79   Pulse (!) 58   Temp (!) 97.5 F (36.4 C) (Oral)   Resp 17   Ht 6' 2 (1.88 m)   Wt 108.9 kg   SpO2 97%   BMI 30.81 kg/m     Wt Readings from Last 3 Encounters:  05/22/24 108.9 kg  04/01/24 111.5 kg  02/03/24 108.7 kg     GEN: Well nourished, well developed in no acute distress CARDIAC: RRR, no murmurs, rubs, gallops RESPIRATORY:  Normal work of breathing MUSCULOSKELETAL: no  edema    ASSESSMENT & PLAN:     CHF with reduced EF EF persistently less than 35% despite GDMT Significant scar burden in LAD territory on MRI We discussed the indication and rationale for defibrillator placement I explained risks of infection, bleeding, lead dislodgment, and a low but nonzero risk for need for prolonged hospitalization, cardiac surgery, and risk of complications that could be life-threatening.  He expressed understanding would like to proceed  Paroxysmal atrial fibrillation Has required cardioversion in the past With sinus bradycardia Will plan to place an atrial lead  Coronary artery disease Angina appears to be well-controlled at present    Signed, Eulas FORBES Furbish, MD  05/22/2024 1:52 PM    [facility]  "

## 2024-05-22 NOTE — Progress Notes (Signed)
 Patient and patient daughter given discharge instructions, education provided no further questions at this time. Patient able to ambulate and void before discharge. Able to tolerate PO intake. Patient site has a minimal amount of unchanged pink tinged drainage, intact with no hematoma noted upon discharge. Seen by Rep, MD and x-ray as well as EKG completed. X-ray reviewed with Md, per MD okay to discharge after x-ray was reviewed.

## 2024-05-24 ENCOUNTER — Encounter (HOSPITAL_COMMUNITY): Payer: Self-pay | Admitting: Cardiovascular Disease

## 2024-05-25 MED FILL — Midazolam HCl Inj 2 MG/2ML (Base Equivalent): INTRAMUSCULAR | Qty: 2 | Status: AC

## 2024-06-03 ENCOUNTER — Ambulatory Visit: Attending: Cardiology

## 2024-06-03 DIAGNOSIS — I5021 Acute systolic (congestive) heart failure: Secondary | ICD-10-CM

## 2024-06-03 NOTE — Patient Instructions (Signed)

## 2024-06-04 LAB — CUP PACEART INCLINIC DEVICE CHECK
Date Time Interrogation Session: 20260121090805
Implantable Lead Connection Status: 753985
Implantable Lead Implant Date: 20260119
Implantable Lead Location: 753859
Implantable Lead Model: 5076
Implantable Pulse Generator Implant Date: 20260119

## 2024-06-04 NOTE — Progress Notes (Signed)
 Normal dual chamber ICD wound check. Wound well healed. Presenting rhythm: AS/VS 66. Routine testing performed. Thresholds, sensing, and impedance consistent with implant measurements with 3.5V safety margin/auto capture until 3 month visit. No treated arrhythmias. Reviewed arm restrictions to continue for 6 weeks total post op. Reviewed shock plan.  Pt enrolled in remote follow-up.

## 2024-07-03 ENCOUNTER — Ambulatory Visit

## 2024-08-21 ENCOUNTER — Ambulatory Visit: Admitting: Student

## 2024-08-21 ENCOUNTER — Ambulatory Visit: Admitting: General Practice

## 2024-10-02 ENCOUNTER — Ambulatory Visit

## 2025-01-01 ENCOUNTER — Ambulatory Visit

## 2025-04-02 ENCOUNTER — Ambulatory Visit

## 2025-07-02 ENCOUNTER — Ambulatory Visit
